# Patient Record
Sex: Male | Born: 1935 | Race: White | Hispanic: No | Marital: Married | Smoking: Never smoker
Health system: Southern US, Community
[De-identification: ages and names within clinical notes are randomized; demographics above are authoritative.]

## PROBLEM LIST (undated history)

## (undated) DIAGNOSIS — I639 Cerebral infarction, unspecified: Secondary | ICD-10-CM

## (undated) DIAGNOSIS — I2510 Atherosclerotic heart disease of native coronary artery without angina pectoris: Secondary | ICD-10-CM

## (undated) DIAGNOSIS — I619 Nontraumatic intracerebral hemorrhage, unspecified: Secondary | ICD-10-CM

---
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
## 2012-04-14 DIAGNOSIS — I619 Nontraumatic intracerebral hemorrhage, unspecified: Secondary | ICD-10-CM

## 2012-04-14 HISTORY — DX: Nontraumatic intracerebral hemorrhage, unspecified: I61.9

## 2013-02-28 ENCOUNTER — Emergency Department (HOSPITAL_COMMUNITY): Payer: Medicare Other

## 2013-02-28 ENCOUNTER — Inpatient Hospital Stay (HOSPITAL_COMMUNITY)
Admission: EM | Admit: 2013-02-28 | Discharge: 2013-03-15 | DRG: 040 | Disposition: A | Discharge status: Rehabilitation Facility | Payer: Medicare Other | Attending: Family Medicine | Admitting: Family Medicine

## 2013-02-28 ENCOUNTER — Encounter (HOSPITAL_COMMUNITY): Payer: Self-pay | Admitting: Emergency Medicine

## 2013-02-28 DIAGNOSIS — E876 Hypokalemia: Secondary | ICD-10-CM | POA: Diagnosis present

## 2013-02-28 DIAGNOSIS — E859 Amyloidosis, unspecified: Secondary | ICD-10-CM

## 2013-02-28 DIAGNOSIS — R443 Hallucinations, unspecified: Secondary | ICD-10-CM | POA: Diagnosis present

## 2013-02-28 DIAGNOSIS — I619 Nontraumatic intracerebral hemorrhage, unspecified: Secondary | ICD-10-CM | POA: Diagnosis present

## 2013-02-28 DIAGNOSIS — I498 Other specified cardiac arrhythmias: Secondary | ICD-10-CM | POA: Diagnosis present

## 2013-02-28 DIAGNOSIS — G9340 Encephalopathy, unspecified: Secondary | ICD-10-CM | POA: Diagnosis present

## 2013-02-28 DIAGNOSIS — R4182 Altered mental status, unspecified: Secondary | ICD-10-CM

## 2013-02-28 DIAGNOSIS — R001 Bradycardia, unspecified: Secondary | ICD-10-CM | POA: Diagnosis present

## 2013-02-28 DIAGNOSIS — H04129 Dry eye syndrome of unspecified lacrimal gland: Secondary | ICD-10-CM | POA: Diagnosis present

## 2013-02-28 DIAGNOSIS — F40298 Other specified phobia: Secondary | ICD-10-CM | POA: Diagnosis present

## 2013-02-28 DIAGNOSIS — H04123 Dry eye syndrome of bilateral lacrimal glands: Secondary | ICD-10-CM | POA: Diagnosis present

## 2013-02-28 DIAGNOSIS — Z79899 Other long term (current) drug therapy: Secondary | ICD-10-CM

## 2013-02-28 DIAGNOSIS — N401 Enlarged prostate with lower urinary tract symptoms: Secondary | ICD-10-CM | POA: Diagnosis present

## 2013-02-28 DIAGNOSIS — I82409 Acute embolism and thrombosis of unspecified deep veins of unspecified lower extremity: Secondary | ICD-10-CM | POA: Diagnosis present

## 2013-02-28 DIAGNOSIS — G0490 Encephalitis and encephalomyelitis, unspecified: Secondary | ICD-10-CM

## 2013-02-28 DIAGNOSIS — E871 Hypo-osmolality and hyponatremia: Secondary | ICD-10-CM | POA: Diagnosis present

## 2013-02-28 DIAGNOSIS — E785 Hyperlipidemia, unspecified: Secondary | ICD-10-CM | POA: Diagnosis present

## 2013-02-28 DIAGNOSIS — N138 Other obstructive and reflux uropathy: Secondary | ICD-10-CM | POA: Diagnosis present

## 2013-02-28 DIAGNOSIS — G060 Intracranial abscess and granuloma: Secondary | ICD-10-CM

## 2013-02-28 DIAGNOSIS — N170 Acute kidney failure with tubular necrosis: Secondary | ICD-10-CM | POA: Diagnosis not present

## 2013-02-28 DIAGNOSIS — R519 Headache, unspecified: Secondary | ICD-10-CM | POA: Diagnosis present

## 2013-02-28 DIAGNOSIS — I2699 Other pulmonary embolism without acute cor pulmonale: Secondary | ICD-10-CM | POA: Diagnosis present

## 2013-02-28 DIAGNOSIS — R339 Retention of urine, unspecified: Secondary | ICD-10-CM | POA: Diagnosis present

## 2013-02-28 DIAGNOSIS — Z113 Encounter for screening for infections with a predominantly sexual mode of transmission: Secondary | ICD-10-CM

## 2013-02-28 DIAGNOSIS — R509 Fever, unspecified: Secondary | ICD-10-CM | POA: Diagnosis present

## 2013-02-28 DIAGNOSIS — R410 Disorientation, unspecified: Secondary | ICD-10-CM

## 2013-02-28 DIAGNOSIS — I10 Essential (primary) hypertension: Secondary | ICD-10-CM | POA: Diagnosis present

## 2013-02-28 DIAGNOSIS — Z8673 Personal history of transient ischemic attack (TIA), and cerebral infarction without residual deficits: Secondary | ICD-10-CM

## 2013-02-28 DIAGNOSIS — I639 Cerebral infarction, unspecified: Secondary | ICD-10-CM

## 2013-02-28 HISTORY — DX: Cerebral infarction, unspecified: I63.9

## 2013-02-28 HISTORY — DX: Atherosclerotic heart disease of native coronary artery without angina pectoris: I25.10

## 2013-02-28 LAB — CBC WITH DIFFERENTIAL/PLATELET
Eosinophils Absolute: 0 10*3/uL (ref 0.0–0.7)
Lymphocytes Relative: 9 % — ABNORMAL LOW (ref 12–46)
Lymphs Abs: 1.4 10*3/uL (ref 0.7–4.0)
MCH: 32.7 pg (ref 26.0–34.0)
MCV: 91.2 fL (ref 78.0–100.0)
Neutro Abs: 11.7 10*3/uL — ABNORMAL HIGH (ref 1.7–7.7)
Neutrophils Relative %: 75 % (ref 43–77)
Platelets: 220 10*3/uL (ref 150–400)
RBC: 4.52 MIL/uL (ref 4.22–5.81)
WBC: 15.5 10*3/uL — ABNORMAL HIGH (ref 4.0–10.5)

## 2013-02-28 LAB — COMPREHENSIVE METABOLIC PANEL
AST: 37 U/L (ref 0–37)
Albumin: 3.4 g/dL — ABNORMAL LOW (ref 3.5–5.2)
Alkaline Phosphatase: 41 U/L (ref 39–117)
BUN: 17 mg/dL (ref 6–23)
CO2: 22 mEq/L (ref 19–32)
Calcium: 9.4 mg/dL (ref 8.4–10.5)
Chloride: 97 mEq/L (ref 96–112)
GFR calc Af Amer: >90 mL/min (ref >90)
GFR calc non Af Amer: 84 mL/min — ABNORMAL LOW (ref >90)
Glucose, Bld: 138 mg/dL — ABNORMAL HIGH (ref 70–99)
Potassium: 3.3 mEq/L — ABNORMAL LOW (ref 3.5–5.1)
Total Bilirubin: 1.3 mg/dL — ABNORMAL HIGH (ref 0.3–1.2)

## 2013-02-28 LAB — CG4 I-STAT (LACTIC ACID): Lactic Acid, Venous: 1.1 mmol/L (ref 0.5–2.2)

## 2013-02-28 MED ORDER — SODIUM CHLORIDE 0.9 % IV SOLN
1000.0000 mL | INTRAVENOUS | Status: DC
  Administered 2013-03-01: 1000 mL via INTRAVENOUS

## 2013-02-28 MED ORDER — ACETAMINOPHEN 325 MG PO TABS
650.0000 mg | ORAL_TABLET | Freq: Once | ORAL | Status: AC
  Administered 2013-03-01: 650 mg via ORAL
  Filled 2013-02-28: qty 2

## 2013-02-28 MED ORDER — PIPERACILLIN-TAZOBACTAM 3.375 G IVPB 30 MIN
3.3750 g | Freq: Once | INTRAVENOUS | Status: AC
  Administered 2013-02-28: 3.375 g via INTRAVENOUS
  Filled 2013-02-28: qty 50

## 2013-02-28 MED ORDER — SODIUM CHLORIDE 0.9 % IV SOLN
1000.0000 mL | Freq: Once | INTRAVENOUS | Status: AC
  Administered 2013-02-28: 1000 mL via INTRAVENOUS

## 2013-02-28 MED ORDER — VANCOMYCIN HCL IN DEXTROSE 1-5 GM/200ML-% IV SOLN
1000.0000 mg | Freq: Once | INTRAVENOUS | Status: AC
  Administered 2013-03-01: 1000 mg via INTRAVENOUS
  Filled 2013-02-28: qty 200

## 2013-02-28 NOTE — ED Notes (Signed)
Lactic Acid results given to EDP.

## 2013-02-28 NOTE — ED Provider Notes (Signed)
CSN: [id number]     Arrival date & time 02/28/13  2219 History   First MD Initiated Contact with Patient 02/28/13 2236     Chief Complaint  Patient presents with  . Altered Mental Status   (Consider location/radiation/quality/duration/timing/severity/associated sxs/prior Treatment) HPI Here for altered mental status. [facility]. Started acting confused today, intermittent symptoms. Described as moderately severe per family at bedside. Pt states he has a frontal headache. Sharp without radiation. Associated with fever, episode of urinary incontinence. Brought here via EMS who report temp 103.    Past Medical History  Diagnosis Date  . Stroke    No past surgical history on file. No family history on file. History  Substance Use Topics  . Smoking status: Never Smoker   . Smokeless tobacco: Not on file  . Alcohol Use: No    Review of Systems Constitutional: Positive for fever.  Eyes: Negative for vision loss.  ENT: Negative for difficulty swallowing.  Cardiovascular: Negative for chest pain. Respiratory: Negative for respiratory distress.  Gastrointestinal:  Negative for vomiting.  Genitourinary: Negative for inability to void.  Musculoskeletal: Negative for gait problem.  Integumentary: Negative for rash.  Neurological: Negative for new focal weakness.     Allergies  Review of patient's allergies indicates no known allergies.  Home Medications   Current Outpatient Rx  Name  Route  Sig  Dispense  Refill  . aspirin 325 MG tablet   Oral   Take 325 mg by mouth daily with lunch.         Marland Kitchen atorvastatin (LIPITOR) 20 MG tablet   Oral   Take 20 mg by mouth at bedtime.         Marland Kitchen esomeprazole (NEXIUM) 40 MG capsule   Oral   Take 40 mg by mouth daily before breakfast.         . fenofibrate (TRICOR) 145 MG tablet   Oral   Take 145 mg by mouth at bedtime.         . Glucosamine-Chondroitin (OSTEO BI-FLEX REGULAR STRENGTH  PO)   Oral   Take 1 tablet by mouth daily with breakfast.         . metoprolol (LOPRESSOR) 50 MG tablet   Oral   Take 50 mg by mouth daily with lunch.         . Multiple Vitamin (MULTIVITAMIN WITH MINERALS) TABS tablet   Oral   Take 1 tablet by mouth daily with breakfast.         . Omega-3 Fatty Acids (FISH OIL PO)   Oral   Take 1 capsule by mouth daily with breakfast.          BP 156/79  Pulse 56  Temp(Src) 102.3 F (39.1 C) (Rectal)  Resp 13  SpO2 97% Physical Exam Nursing note and vitals reviewed.  Constitutional: Pt is alert and appears stated age. Warm to touch.  Eyes: No injection, no scleral icterus. HENT: Atraumatic, airway open without erythema or exudate.  Respiratory: No respiratory distress. Equal breathing bilaterally. Cardiovascular: Normal rate. Extremities warm and well perfused.  Abdomen: Soft, non-tender. MSK: Extremities are atraumatic without deformity. Skin: No rash, no wounds.   Neuro: No motor nor sensory deficit. GCS 15. Oriented x3. Normal CN.      ED Course  Procedures (including critical care time) Labs Review Labs Reviewed  CBC WITH DIFFERENTIAL - Abnormal; Notable for the following:    WBC 15.5 (*)    Neutro Abs 11.7 (*)  Lymphocytes Relative 9 (*)    Monocytes Relative 16 (*)    Monocytes Absolute 2.4 (*)    All other components within normal limits  COMPREHENSIVE METABOLIC PANEL - Abnormal; Notable for the following:    Sodium 131 (*)    Potassium 3.3 (*)    Glucose, Bld 138 (*)    Albumin 3.4 (*)    Total Bilirubin 1.3 (*)    GFR calc non Af Amer 84 (*)    All other components within normal limits  CULTURE, BLOOD (ROUTINE X 2)  CULTURE, BLOOD (ROUTINE X 2)  URINE CULTURE  URINALYSIS, ROUTINE W REFLEX MICROSCOPIC  CG4 I-STAT (LACTIC ACID)   Imaging Review Ct Head Wo Contrast  02/28/2013   CLINICAL DATA:  Altered mental status.  Stroke.  EXAM: CT HEAD WITHOUT CONTRAST  TECHNIQUE: Contiguous axial images were  obtained from the base of the skull through the vertex without intravenous contrast.  COMPARISON:  None.  FINDINGS: Skull and Sinuses:No significant abnormality.  Orbits: Bilateral cataract resection.  Brain: Acute hemorrhage in the posterior right cerebral hemisphere, located in the posterior temporal and occipital regions. The hematoma is irregularly shaped, but the entire area of abnormality measures 7cm in length, 3 cm in width. There is a moderate amount of surrounding cerebral edema. Minimal intraventricular extension into the right lateral ventricle. No herniation, midline shift, or hydrocephalus.  Chronic small vessel ischemic white matter disease with patchy bilateral cerebral white matter low density. Extensive intracranial atherosclerotic calcification.  Critical Value/emergent results were called by telephone at the time of interpretation on 02/28/2013 at 11:13 PM to Dr.JOSHUA ZAVITZ , who verbally acknowledged these results.  IMPRESSION: 1. Right temporal/occipital parenchymal hematoma, measuring up to 7 cm in length. Trace intraventricular extension; no hydrocephalus. 2. Chronic small vessel ischemic white matter disease.   Electronically Signed   By: Tiburcio Pea M.D.   On: 02/28/2013 23:16   Dg Chest Portable 1 View  02/28/2013   CLINICAL DATA:  Altered mental status.  EXAM: PORTABLE CHEST - 1 VIEW  COMPARISON:  None.  FINDINGS: The heart size and mediastinal contours are within normal limits. Both lungs are clear. The visualized skeletal structures are unremarkable.  IMPRESSION: No active disease.   Electronically Signed   By: Tiburcio Pea M.D.   On: 02/28/2013 23:03    EKG Interpretation   None       MDM   1. Altered mental status   2. Fever   3. Delirium    77 y.o. male w/ PMHx of recent hemorraghic stroke presents w/ fever, AMS c/w delirium. Concerned for hospital acquired infection. No diarrhea to suggest c diff. Normal skin exam. Code sepsis initiated with vanc/zosyn.  CT head ordered given recent bleed.   CT head with right temporal/occiptial parenchyma hematoma without midline shift, herniation, or hydrocephalus. Images compared to those at [facility] from last week. Called [facility] to discuss. They will evaluate in the morning and advise supportive care. CBC with leukocytosis. CMP unremarkable. Lactic acid low. Pt stable on re-eval. Urine possible source given incontinence. Asked nurse to cath for UA. Called medicine for admission. Temp orders placed.      I independently viewed, interpreted, and used in my medical decision making all ordered lab and imaging tests. Medical Decision Making discussed with ED attending Enid Skeens, MD      Charm Barges, MD 03/01/13 [phone]

## 2013-02-28 NOTE — ED Notes (Signed)
Per EMS: pt with prev hemorrhagic stroke, dc from [facility] yesterday, per fam past 6 hours progressive worsening in mental status and increased confusion although symptoms started this AM.  Pt states he has severe frontal h/a, no unilateral deficits noted.  Febrile per EMS and on arrival.

## 2013-03-01 ENCOUNTER — Encounter (HOSPITAL_COMMUNITY): Payer: Self-pay | Admitting: Neurology

## 2013-03-01 ENCOUNTER — Inpatient Hospital Stay (HOSPITAL_COMMUNITY): Payer: Medicare Other

## 2013-03-01 DIAGNOSIS — R404 Transient alteration of awareness: Secondary | ICD-10-CM

## 2013-03-01 DIAGNOSIS — R4182 Altered mental status, unspecified: Secondary | ICD-10-CM

## 2013-03-01 DIAGNOSIS — I10 Essential (primary) hypertension: Secondary | ICD-10-CM

## 2013-03-01 DIAGNOSIS — R509 Fever, unspecified: Secondary | ICD-10-CM

## 2013-03-01 DIAGNOSIS — I619 Nontraumatic intracerebral hemorrhage, unspecified: Secondary | ICD-10-CM

## 2013-03-01 DIAGNOSIS — I498 Other specified cardiac arrhythmias: Secondary | ICD-10-CM

## 2013-03-01 DIAGNOSIS — E876 Hypokalemia: Secondary | ICD-10-CM

## 2013-03-01 LAB — COMPREHENSIVE METABOLIC PANEL
ALT: 28 U/L (ref 0–53)
AST: 31 U/L (ref 0–37)
CO2: 20 mEq/L (ref 19–32)
Calcium: 8.7 mg/dL (ref 8.4–10.5)
Creatinine, Ser: 0.98 mg/dL (ref 0.50–1.35)
GFR calc Af Amer: >90 mL/min (ref >90)
GFR calc non Af Amer: 79 mL/min — ABNORMAL LOW (ref >90)
Glucose, Bld: 111 mg/dL — ABNORMAL HIGH (ref 70–99)
Sodium: 137 mEq/L (ref 135–145)
Total Protein: 6.8 g/dL (ref 6.0–8.3)

## 2013-03-01 LAB — CBC WITH DIFFERENTIAL/PLATELET
Basophils Absolute: 0 10*3/uL (ref 0.0–0.1)
Basophils Relative: 0 % (ref 0–1)
Eosinophils Absolute: 0 10*3/uL (ref 0.0–0.7)
Eosinophils Relative: 0 % (ref 0–5)
HCT: 40 % (ref 39.0–52.0)
Lymphocytes Relative: 15 % (ref 12–46)
Lymphs Abs: 1.8 10*3/uL (ref 0.7–4.0)
MCH: 31.9 pg (ref 26.0–34.0)
MCHC: 35 g/dL (ref 30.0–36.0)
MCV: 91.1 fL (ref 78.0–100.0)
Monocytes Absolute: 2.2 10*3/uL — ABNORMAL HIGH (ref 0.1–1.0)
Platelets: 181 10*3/uL (ref 150–400)
RDW: 13 % (ref 11.5–15.5)

## 2013-03-01 LAB — URINALYSIS, ROUTINE W REFLEX MICROSCOPIC
Glucose, UA: NEGATIVE mg/dL
Hgb urine dipstick: NEGATIVE
Ketones, ur: NEGATIVE mg/dL
Protein, ur: NEGATIVE mg/dL

## 2013-03-01 LAB — SEDIMENTATION RATE: Sed Rate: 27 mm/hr — ABNORMAL HIGH (ref 0–16)

## 2013-03-01 MED ORDER — METOPROLOL TARTRATE 25 MG PO TABS
25.0000 mg | ORAL_TABLET | Freq: Two times a day (BID) | ORAL | Status: DC
  Filled 2013-03-01 (×2): qty 1

## 2013-03-01 MED ORDER — TRAMADOL HCL 50 MG PO TABS
50.0000 mg | ORAL_TABLET | Freq: Four times a day (QID) | ORAL | Status: DC | PRN
  Administered 2013-03-01 – 2013-03-08 (×12): 50 mg via ORAL
  Filled 2013-03-01 (×12): qty 1

## 2013-03-01 MED ORDER — ONDANSETRON HCL 4 MG/2ML IJ SOLN
4.0000 mg | Freq: Three times a day (TID) | INTRAMUSCULAR | Status: DC | PRN
  Administered 2013-03-01 – 2013-03-03 (×2): 4 mg via INTRAVENOUS
  Filled 2013-03-01: qty 2

## 2013-03-01 MED ORDER — PIPERACILLIN-TAZOBACTAM 3.375 G IVPB
3.3750 g | Freq: Three times a day (TID) | INTRAVENOUS | Status: DC
  Administered 2013-03-01: 3.375 g via INTRAVENOUS
  Filled 2013-03-01 (×3): qty 50

## 2013-03-01 MED ORDER — HYDRALAZINE HCL 20 MG/ML IJ SOLN
10.0000 mg | Freq: Four times a day (QID) | INTRAMUSCULAR | Status: DC | PRN
  Administered 2013-03-01 – 2013-03-13 (×15): 10 mg via INTRAVENOUS
  Filled 2013-03-01 (×15): qty 1

## 2013-03-01 MED ORDER — VANCOMYCIN HCL IN DEXTROSE 1-5 GM/200ML-% IV SOLN
1000.0000 mg | Freq: Two times a day (BID) | INTRAVENOUS | Status: DC
  Administered 2013-03-01 – 2013-03-02 (×2): 1000 mg via INTRAVENOUS
  Filled 2013-03-01 (×3): qty 200

## 2013-03-01 MED ORDER — ACETAMINOPHEN 325 MG PO TABS
650.0000 mg | ORAL_TABLET | Freq: Four times a day (QID) | ORAL | Status: DC | PRN
  Administered 2013-03-01 – 2013-03-15 (×26): 650 mg via ORAL
  Filled 2013-03-01 (×26): qty 2

## 2013-03-01 MED ORDER — ENSURE COMPLETE PO LIQD
237.0000 mL | Freq: Three times a day (TID) | ORAL | Status: DC
  Administered 2013-03-01 – 2013-03-06 (×13): 237 mL via ORAL

## 2013-03-01 MED ORDER — ONDANSETRON HCL 4 MG/2ML IJ SOLN
4.0000 mg | Freq: Three times a day (TID) | INTRAMUSCULAR | Status: AC | PRN
  Filled 2013-03-01: qty 2

## 2013-03-01 MED ORDER — POTASSIUM CHLORIDE CRYS ER 20 MEQ PO TBCR
40.0000 meq | EXTENDED_RELEASE_TABLET | Freq: Once | ORAL | Status: AC
  Administered 2013-03-01: 40 meq via ORAL
  Filled 2013-03-01: qty 2

## 2013-03-01 MED ORDER — SODIUM CHLORIDE 0.9 % IV SOLN
INTRAVENOUS | Status: DC
  Administered 2013-03-01 – 2013-03-10 (×14): via INTRAVENOUS

## 2013-03-01 MED ORDER — PANTOPRAZOLE SODIUM 40 MG PO TBEC
40.0000 mg | DELAYED_RELEASE_TABLET | Freq: Every day | ORAL | Status: DC
  Administered 2013-03-01 – 2013-03-15 (×14): 40 mg via ORAL
  Filled 2013-03-01 (×13): qty 1

## 2013-03-01 MED ORDER — ATORVASTATIN CALCIUM 20 MG PO TABS
20.0000 mg | ORAL_TABLET | Freq: Every day | ORAL | Status: DC
  Administered 2013-03-01 – 2013-03-14 (×14): 20 mg via ORAL
  Filled 2013-03-01 (×17): qty 1

## 2013-03-01 MED ORDER — DEXTROSE 5 % IV SOLN
2.0000 g | INTRAVENOUS | Status: DC
  Administered 2013-03-01: 2 g via INTRAVENOUS
  Filled 2013-03-01 (×2): qty 2

## 2013-03-01 MED ORDER — ENSURE COMPLETE PO LIQD
237.0000 mL | Freq: Two times a day (BID) | ORAL | Status: DC
  Administered 2013-03-01 (×2): 237 mL via ORAL

## 2013-03-01 NOTE — ED Notes (Signed)
BC's obtained

## 2013-03-01 NOTE — Consult Note (Signed)
NEURO HOSPITALIST CONSULT NOTE    Reason for Consult: confusion  HPI:                                                                                                                                          Walter Larsen is an 77 y.o. male who was discharged from [facility] on 02/27/13 after suffering a right temporal/occipital parenchymal hematoma. Per labs at [facility] his WBC on 11/14 (11.8), 11/15 (15.6) and 11/16 (13.5).  Patietn was brought to [facility] ED after family had brought patient home and noted he was complaining of HA, confusion (talking about a basket ball which never occurred) and periods of staring, in addition to elevated BP (190's systolic). On EMS arrival he was noted to be febrile and on admission to ED he was noted to have a temperature of 103. CT obtained In ED showed previous ICH with trace intraventricular extension which was noted in films prior at [facility]. patient was seen by internal medicine who started him on Zosyn.  Currently his temperature is 100.3. WBC has decreased from 15.5 to 12.0. Granddaughter who is at bedside states his mental status has improved significantly but he is still not back to his baseline. Patients main complaint is HA located in the frontal and right parietal region.   Past Medical History  Diagnosis Date  . Stroke     No past surgical history on file.  Family History  Problem Relation Age of Onset  . Hypertension Mother   . Hypertension Father      Social History:  reports that he has never smoked. He does not have any smokeless tobacco history on file. He reports that he does not drink alcohol or use illicit drugs.  No Known Allergies  MEDICATIONS:                                                                                                                     Prior to Admission:  Prescriptions prior to admission  Medication Sig Dispense Refill  . atorvastatin (LIPITOR) 20 MG tablet Take 20 mg by mouth at bedtime.       Marland Kitchen esomeprazole (NEXIUM) 40 MG capsule Take 40 mg by mouth daily before breakfast.      . fenofibrate (TRICOR) 145 MG  tablet Take 145 mg by mouth at bedtime.      . Glucosamine-Chondroitin (OSTEO BI-FLEX REGULAR STRENGTH PO) Take 1 tablet by mouth daily with breakfast.      . metoprolol (LOPRESSOR) 50 MG tablet Take 50 mg by mouth daily with lunch.      . Multiple Vitamin (MULTIVITAMIN WITH MINERALS) TABS tablet Take 1 tablet by mouth daily with breakfast.      . Omega-3 Fatty Acids (FISH OIL PO) Take 1 capsule by mouth daily with breakfast.      . [DISCONTINUED] aspirin 325 MG tablet Take 325 mg by mouth daily with lunch.       Scheduled: . atorvastatin  20 mg Oral QHS  . feeding supplement (ENSURE COMPLETE)  237 mL Oral BID BM  . pantoprazole  40 mg Oral Daily  . piperacillin-tazobactam (ZOSYN)  IV  3.375 g Intravenous Q8H  . vancomycin  1,000 mg Intravenous Q12H   Continuous: . sodium chloride 100 mL/hr at 03/01/13 0902   ZOX:[id number], hydrALAZINE, ondansetron (ZOFRAN) IV   ROS:                                                                                                                                       History obtained from the patient  General ROS: negative for - chills, fatigue, fever, night sweats, weight gain or weight loss Psychological ROS: negative for - behavioral disorder, hallucinations, memory difficulties, mood swings or suicidal ideation Ophthalmic ROS: negative for - blurry vision, double vision, eye pain or loss of vision ENT ROS: negative for - epistaxis, nasal discharge, oral lesions, sore throat, tinnitus or vertigo Allergy and Immunology ROS: negative for - hives or itchy/watery eyes Hematological and Lymphatic ROS: negative for - bleeding problems, bruising or swollen lymph nodes Endocrine ROS: negative for - galactorrhea, hair pattern changes, polydipsia/polyuria or temperature intolerance Respiratory ROS: negative for - cough, hemoptysis,  shortness of breath or wheezing Cardiovascular ROS: negative for - chest pain, dyspnea on exertion, edema or irregular heartbeat Gastrointestinal ROS: negative for - abdominal pain, diarrhea, hematemesis, nausea/vomiting or stool incontinence Genito-Urinary ROS: negative for - dysuria, hematuria, incontinence or urinary frequency/urgency Musculoskeletal ROS: negative for - joint swelling or muscular weakness Neurological ROS: as noted in HPI Dermatological ROS: negative for rash and skin lesion changes   Blood pressure 145/60, pulse 65, temperature 100.3 F (37.9 C), temperature source Oral, resp. rate 18, height 5\' 8"  (1.727 m), weight 81.647 kg (180 lb), SpO2 98.00%.   Neurologic Examination:  Mental Status: Alert, oriented, thought content appropriate.  Speech fluent without evidence of aphasia.  Able to follow 3 step commands without difficulty. Able to name objects and tell what they are used for.  Cranial Nerves: II: Discs flat bilaterally; Visual fields (+) left hemianopsia, pupils equal, round, reactive to light and accommodation III,IV, VI: ptosis not present, extra-ocular motions intact bilaterally V,VII: smile symmetric, facial light touch sensation normal bilaterally VIII: hearing normal bilaterally IX,X: gag reflex present XI: bilateral shoulder shrug XII: midline tongue extension without atrophy or fasciculations  Motor: Right : Upper extremity   5/5    Left:     Upper extremity   5/5  Lower extremity   5/5     Lower extremity   5/5 Tone and bulk:normal tone throughout; no atrophy noted Sensory: Pinprick and light touch intact throughout, bilaterally Deep Tendon Reflexes:  Right: Upper Extremity   Left: Upper extremity   biceps (C-5 to C-6) 2/4   biceps (C-5 to C-6) 2/4 tricep (C7) 2/4    triceps (C7) 2/4 Brachioradialis (C6) 2/4  Brachioradialis (C6) 2/4  Lower  Extremity Lower Extremity  quadriceps (L-2 to L-4) 2/4   quadriceps (L-2 to L-4) 2/4 Achilles (S1) 2/4   Achilles (S1) 2/4  Plantars: Right: downgoing   Left: downgoing Cerebellar: normal finger-to-nose,  normal heel-to-shin test Gait: not tested CV: pulses palpable throughout    No components found with this basename: cbc,  bmp,  coags,  chol,  tri,  ldl,  hga1c    Results for orders placed during the hospital encounter of 02/28/13 (from the past 48 hour(s))  CBC WITH DIFFERENTIAL     Status: Abnormal   Collection Time    02/28/13 10:45 PM      Result Value Range   WBC 15.5 (*) 4.0 - 10.5 K/uL   RBC 4.52  4.22 - 5.81 MIL/uL   Hemoglobin 14.8  13.0 - 17.0 g/dL   HCT 40.9  81.1 - 91.4 %   MCV 91.2  78.0 - 100.0 fL   MCH 32.7  26.0 - 34.0 pg   MCHC 35.9  30.0 - 36.0 g/dL   RDW 78.2  95.6 - 21.3 %   Platelets 220  150 - 400 K/uL   Neutrophils Relative % 75  43 - 77 %   Neutro Abs 11.7 (*) 1.7 - 7.7 K/uL   Lymphocytes Relative 9 (*) 12 - 46 %   Lymphs Abs 1.4  0.7 - 4.0 K/uL   Monocytes Relative 16 (*) 3 - 12 %   Monocytes Absolute 2.4 (*) 0.1 - 1.0 K/uL   Eosinophils Relative 0  0 - 5 %   Eosinophils Absolute 0.0  0.0 - 0.7 K/uL   Basophils Relative 0  0 - 1 %   Basophils Absolute 0.0  0.0 - 0.1 K/uL  COMPREHENSIVE METABOLIC PANEL     Status: Abnormal   Collection Time    02/28/13 10:50 PM      Result Value Range   Sodium 131 (*) 135 - 145 mEq/L   Potassium 3.3 (*) 3.5 - 5.1 mEq/L   Chloride 97  96 - 112 mEq/L   CO2 22  19 - 32 mEq/L   Glucose, Bld 138 (*) 70 - 99 mg/dL   BUN 17  6 - 23 mg/dL   Creatinine, Ser 0.86  0.50 - 1.35 mg/dL   Calcium 9.4  8.4 - 57.8 mg/dL   Total Protein 7.3  6.0 - 8.3 g/dL  Albumin 3.4 (*) 3.5 - 5.2 g/dL   AST 37  0 - 37 U/L   ALT 32  0 - 53 U/L   Alkaline Phosphatase 41  39 - 117 U/L   Total Bilirubin 1.3 (*) 0.3 - 1.2 mg/dL   GFR calc non Af Amer 84 (*) >90 mL/min   GFR calc Af Amer >90  >90 mL/min   Comment: (NOTE)     The eGFR  has been calculated using the CKD EPI equation.     This calculation has not been validated in all clinical situations.     eGFR's persistently <90 mL/min signify possible Chronic Kidney     Disease.  CG4 I-STAT (LACTIC ACID)     Status: None   Collection Time    02/28/13 11:01 PM      Result Value Range   Lactic Acid, Venous 1.10  0.5 - 2.2 mmol/L  URINALYSIS, ROUTINE W REFLEX MICROSCOPIC     Status: Abnormal   Collection Time    03/01/13 12:16 AM      Result Value Range   Color, Urine YELLOW  YELLOW   APPearance CLEAR  CLEAR   Specific Gravity, Urine 1.014  1.005 - 1.030   pH 7.0  5.0 - 8.0   Glucose, UA NEGATIVE  NEGATIVE mg/dL   Hgb urine dipstick NEGATIVE  NEGATIVE   Bilirubin Urine NEGATIVE  NEGATIVE   Ketones, ur NEGATIVE  NEGATIVE mg/dL   Protein, ur NEGATIVE  NEGATIVE mg/dL   Urobilinogen, UA 2.0 (*) 0.0 - 1.0 mg/dL   Nitrite NEGATIVE  NEGATIVE   Leukocytes, UA NEGATIVE  NEGATIVE   Comment: MICROSCOPIC NOT DONE ON URINES WITH NEGATIVE PROTEIN, BLOOD, LEUKOCYTES, NITRITE, OR GLUCOSE <1000 mg/dL.  CBC WITH DIFFERENTIAL     Status: Abnormal   Collection Time    03/01/13  6:15 AM      Result Value Range   WBC 12.0 (*) 4.0 - 10.5 K/uL   RBC 4.39  4.22 - 5.81 MIL/uL   Hemoglobin 14.0  13.0 - 17.0 g/dL   HCT 82.9  56.2 - 13.0 %   MCV 91.1  78.0 - 100.0 fL   MCH 31.9  26.0 - 34.0 pg   MCHC 35.0  30.0 - 36.0 g/dL   RDW 86.5  78.4 - 69.6 %   Platelets 181  150 - 400 K/uL   Neutrophils Relative % 66  43 - 77 %   Neutro Abs 8.0 (*) 1.7 - 7.7 K/uL   Lymphocytes Relative 15  12 - 46 %   Lymphs Abs 1.8  0.7 - 4.0 K/uL   Monocytes Relative 19 (*) 3 - 12 %   Monocytes Absolute 2.2 (*) 0.1 - 1.0 K/uL   Eosinophils Relative 0  0 - 5 %   Eosinophils Absolute 0.0  0.0 - 0.7 K/uL   Basophils Relative 0  0 - 1 %   Basophils Absolute 0.0  0.0 - 0.1 K/uL  COMPREHENSIVE METABOLIC PANEL     Status: Abnormal   Collection Time    03/01/13  6:15 AM      Result Value Range   Sodium 137   135 - 145 mEq/L   Potassium 3.6  3.5 - 5.1 mEq/L   Chloride 104  96 - 112 mEq/L   CO2 20  19 - 32 mEq/L   Glucose, Bld 111 (*) 70 - 99 mg/dL   BUN 15  6 - 23 mg/dL   Creatinine, Ser 2.95  0.50 -  1.35 mg/dL   Calcium 8.7  8.4 - 16.1 mg/dL   Total Protein 6.8  6.0 - 8.3 g/dL   Albumin 3.0 (*) 3.5 - 5.2 g/dL   AST 31  0 - 37 U/L   ALT 28  0 - 53 U/L   Alkaline Phosphatase 39  39 - 117 U/L   Total Bilirubin 1.1  0.3 - 1.2 mg/dL   GFR calc non Af Amer 79 (*) >90 mL/min   GFR calc Af Amer >90  >90 mL/min   Comment: (NOTE)     The eGFR has been calculated using the CKD EPI equation.     This calculation has not been validated in all clinical situations.     eGFR's persistently <90 mL/min signify possible Chronic Kidney     Disease.  SEDIMENTATION RATE     Status: Abnormal   Collection Time    03/01/13  6:15 AM      Result Value Range   Sed Rate 27 (*) 0 - 16 mm/hr    Ct Head Wo Contrast  02/28/2013   CLINICAL DATA:  Altered mental status.  Stroke.  EXAM: CT HEAD WITHOUT CONTRAST  TECHNIQUE: Contiguous axial images were obtained from the base of the skull through the vertex without intravenous contrast.  COMPARISON:  None.  FINDINGS: Skull and Sinuses:No significant abnormality.  Orbits: Bilateral cataract resection.  Brain: Acute hemorrhage in the posterior right cerebral hemisphere, located in the posterior temporal and occipital regions. The hematoma is irregularly shaped, but the entire area of abnormality measures 7cm in length, 3 cm in width. There is a moderate amount of surrounding cerebral edema. Minimal intraventricular extension into the right lateral ventricle. No herniation, midline shift, or hydrocephalus.  Chronic small vessel ischemic white matter disease with patchy bilateral cerebral white matter low density. Extensive intracranial atherosclerotic calcification.  Critical Value/emergent results were called by telephone at the time of interpretation on 02/28/2013 at 11:13 PM  to Dr.JOSHUA ZAVITZ , who verbally acknowledged these results.  IMPRESSION: 1. Right temporal/occipital parenchymal hematoma, measuring up to 7 cm in length. Trace intraventricular extension; no hydrocephalus. 2. Chronic small vessel ischemic white matter disease.   Electronically Signed   By: Tiburcio Pea M.D.   On: 02/28/2013 23:16   Dg Chest Portable 1 View  02/28/2013   CLINICAL DATA:  Altered mental status.  EXAM: PORTABLE CHEST - 1 VIEW  COMPARISON:  None.  FINDINGS: The heart size and mediastinal contours are within normal limits. Both lungs are clear. The visualized skeletal structures are unremarkable.  IMPRESSION: No active disease.   Electronically Signed   By: Tiburcio Pea M.D.   On: 02/28/2013 23:03    Assessment and plan per attending neurologist  Felicie Morn PA-C [facility] [phone]  03/01/2013, 12:37 PM   Assessment/Plan: 77 YO male with recent right temporal/occipital ICH which is unchanged since prior scan.  Patietn has improved in mentation post being treated with Vancomycin and Zosyn. Exam is non focal other than neck discomfort with FF and left hemianopsia (from recent ICH). Pateints neck discomfort is concerning. However, would not attempt LP at this time, as results would not be reliable. The intraventricular blood would cause a in accurate increase in CSF WBC count.  EEG has been finished and awaiting read.     Recommend: 1) Changing Zosyn to Rocephin for further coverage.   2) Continue Vancomycin 3) EEG reading to follow 4) Will continue to follow  I personally participated in this patient's evaluation  and management, including formulating about clinical impression and management recommendations.  Venetia Maxon M.D. [facility] [phone]

## 2013-03-01 NOTE — ED Provider Notes (Signed)
Medical screening examination/treatment/procedure(s) were conducted as a shared visit with non-physician practitioner(s) or resident and myself. I personally evaluated the patient during the encounter and agree with the findings and plan unless otherwise indicated.  I have personally reviewed any xrays and/ or EKG's with the provider and I agree with interpretation.  Worsening confusion since dc from [facility] yesterday. Fever today. No injuries. Pt not tolerating po, vomiting intermittent.  Exam general weakness,dry mm, mild pale, AO3, moves ext equal bilateral. Resident spoke with NSGY and hospitalist for admission to tele. Abx in ED. Fluids given.   Hemorrhagic CNS, Delirium, Fever EKG done as pt bradycardia/ confusion, reviewed.   Ct Head Wo Contrast  02/28/2013   CLINICAL DATA:  Altered mental status.  Stroke.  EXAM: CT HEAD WITHOUT CONTRAST  TECHNIQUE: Contiguous axial images were obtained from the base of the skull through the vertex without intravenous contrast.  COMPARISON:  None.  FINDINGS: Skull and Sinuses:No significant abnormality.  Orbits: Bilateral cataract resection.  Brain: Acute hemorrhage in the posterior right cerebral hemisphere, located in the posterior temporal and occipital regions. The hematoma is irregularly shaped, but the entire area of abnormality measures 7cm in length, 3 cm in width. There is a moderate amount of surrounding cerebral edema. Minimal intraventricular extension into the right lateral ventricle. No herniation, midline shift, or hydrocephalus.  Chronic small vessel ischemic white matter disease with patchy bilateral cerebral white matter low density. Extensive intracranial atherosclerotic calcification.  Critical Value/emergent results were called by telephone at the time of interpretation on 02/28/2013 at 11:13 PM to Dr.Jakyla Reza , who verbally acknowledged these results.  IMPRESSION: 1. Right temporal/occipital parenchymal hematoma, measuring up to 7 cm in  length. Trace intraventricular extension; no hydrocephalus. 2. Chronic small vessel ischemic white matter disease.   Electronically Signed   By: Tiburcio Pea M.D.   On: 02/28/2013 23:16   Dg Chest Portable 1 View  02/28/2013   CLINICAL DATA:  Altered mental status.  EXAM: PORTABLE CHEST - 1 VIEW  COMPARISON:  None.  FINDINGS: The heart size and mediastinal contours are within normal limits. Both lungs are clear. The visualized skeletal structures are unremarkable.  IMPRESSION: No active disease.   Electronically Signed   By: Tiburcio Pea M.D.   On: 02/28/2013 23:03   EKG Interpretation    Date/Time:  Tuesday March 01 2013 00:45:22 EST Ventricular Rate:  61 PR Interval:  157 QRS Duration: 87 QT Interval:  475 QTC Calculation: 478 R Axis:   76 Text Interpretation:  Sinus rhythm Left atrial enlargement Borderline prolonged QT interval No acute findings              Enid Skeens, MD 03/01/13 [phone]

## 2013-03-01 NOTE — ED Notes (Addendum)
Portable cxr in room

## 2013-03-01 NOTE — Progress Notes (Signed)
ANTIBIOTIC CONSULT NOTE - INITIAL  Pharmacy Consult for Vancomycin/Zosyn Indication: Fever, unknown origin  No Known Allergies  Patient Measurements: Height: 5\' 8"  (172.7 cm) Weight: 180 lb (81.647 kg) IBW/kg (Calculated) : 68.4  Vital Signs: Temp: 98.2 F (36.8 C) (11/18 0152) Temp src: Oral (11/18 0152) BP: 148/71 mmHg (11/18 0152) Pulse Rate: 49 (11/18 0152) Labs:  Recent Labs  02/28/13 2245 02/28/13 2250  WBC 15.5*  --   HGB 14.8  --   PLT 220  --   CREATININE  --  0.85   CrCl: 73.65mL/min  Assessment: 77 y/o M here with AMS/fever, DC'd from [facility] recently with hemorrhagic stroke (evident on CT 11/17). Lactic acid normal, mild leukocytosis, Tmax 102.3. Getting cultures and covering empirically with antibiotics for now.   ED Antibiotics  Vancomycin 1000 mg IV x 1 (11/18 0004) Zosyn 3.375g IV x 1 (11/17 2337)  Goal of Therapy:  Vancomycin trough level 15-20 mcg/ml  Plan:  -Vancomycin 1000 mg IV q12h -Zosyn 3.375G IV q8h to be infused over 4 hours -Trend WBC, temp, renal function -F/U cultures, imaging  Thank you for allowing me to take part in this patient's care,  Abran Duke, PharmD Clinical Pharmacist Phone: [phone] Pager: [phone] 03/01/2013 3:51 AM

## 2013-03-01 NOTE — Progress Notes (Signed)
INITIAL NUTRITION ASSESSMENT  DOCUMENTATION CODES Per approved criteria  -Not Applicable   INTERVENTION: Increase Ensure Complete to PO TID. Recommend diet liberalization to Regular to help with PO variety. RD to continue to follow nutrition care plan.  NUTRITION DIAGNOSIS: Inadequate oral intake related to severe nausea as evidenced by limited PO intake.   Goal: Intake to meet >90% of estimated nutrition needs.  Monitor:  weight trends, lab trends, I/O's, PO intake, supplement tolerance  Reason for Assessment: Malnutrition Screening Tool  77 y.o. male  Admitting Dx: Fever, unspecified  ASSESSMENT: PMHx significant for HTN, dyslipidemia. Recently admitted to [facility] with hemorrhagic stroke, discharged the day before yesterday.  Admitted with AMS, hallucinations, HA, fever and nausea. Work-up ongoing.  Pt has had improvement in mentation since being treated with Vanc and Zosyn. EEG completed, results pending, neuro following.  Currently ordered for Ensure Complete PO BID. Pt is tolerating these well and is consuming them as scheduled. Remains very nauseated and was nauseated at home. Per family, pt ate very poorly in the hospital. Usually weighs about 185 lb. Currently down to 180 lb. This is a weight change of 3%. Pt is unable to state when he last weighed this. Pt is at nutrition risk 2/2 poor oral intake and weight loss. Family interested in patient consuming more than 2 Ensures daily since that is all he is consuming. Will increase to TID.  Potassium and sodium were low on admission, currently WNL.  Height: Ht Readings from Last 1 Encounters:  03/01/13 5\' 8"  (1.727 m)    Weight: Wt Readings from Last 1 Encounters:  03/01/13 180 lb (81.647 kg)    Ideal Body Weight: 154 lb  % Ideal Body Weight: 117%  Wt Readings from Last 10 Encounters:  03/01/13 180 lb (81.647 kg)    Usual Body Weight: 185 lb  % Usual Body Weight: 97%  BMI:  Body mass index is  27.38 kg/(m^2). Overweight  Estimated Nutritional Needs: Kcal: 1700 - 2000 Protein: 80 - 95 g Fluid: 1.7 - 2 liters  Skin: stage II sacrum  Diet Order: Cardiac  EDUCATION NEEDS: -No education needs identified at this time   Intake/Output Summary (Last 24 hours) at 03/01/13 1357 Last data filed at 03/01/13 1337  Gross per 24 hour  Intake 4459.17 ml  Output    610 ml  Net 3849.17 ml    Last BM: 11/18  Labs:   Recent Labs Lab 02/28/13 2250 03/01/13 0615  NA 131* 137  K 3.3* 3.6  CL 97 104  CO2 22 20  BUN 17 15  CREATININE 0.85 0.98  CALCIUM 9.4 8.7  GLUCOSE 138* 111*    CBG (last 3)  No results found for this basename: GLUCAP,  in the last 72 hours  Scheduled Meds: . atorvastatin  20 mg Oral QHS  . cefTRIAXone (ROCEPHIN)  IV  2 g Intravenous Q24H  . feeding supplement (ENSURE COMPLETE)  237 mL Oral BID BM  . pantoprazole  40 mg Oral Daily  . vancomycin  1,000 mg Intravenous Q12H    Continuous Infusions: . sodium chloride 100 mL/hr at 03/01/13 8657    Past Medical History  Diagnosis Date  . Stroke     No past surgical history on file.  Jarold Motto MS, RD, LDN Pager: [phone] After-hours pager: [phone]

## 2013-03-01 NOTE — Procedures (Signed)
ELECTROENCEPHALOGRAM REPORT   Patient: Walter Larsen       Room #: 2X52 EEG No. ID: [id number] Age: 77 y.o.        Sex: male Referring Physician: Regalado Report Date:  03/01/2013        Interpreting Physician: Aline Brochure  History: Walter Larsen is an 77 y.o. male with a recent large right parieto-occipital intracerebral hemorrhage who was brought to the hospital following change in mental status. Patient was febrile on admission.  Indications for study:  Assess severity of encephalopathy; rule out focal seizure activity.  Technique: This is an 18 channel routine scalp EEG performed at the bedside with bipolar and monopolar montages arranged in accordance to the international 10/20 system of electrode placement.   Description: This EEG recording was performed during sleep and during a brief period of drowsiness. Background activity consisted of generalized slowing with nonspecific low amplitude diffuse irregular delta and theta activity, which was symmetrical. Normal sleep spindles, arousal responses and vertex waves were recorded during stage II of sleep. Photic stimulation was not performed. Hyperventilation was not performed. No epileptiform discharges recorded.  Interpretation: This is a normal EEG recording during sleep. No evidence of an epileptic disorder was seen.  Venetia Maxon M.D. [facility] [phone]

## 2013-03-01 NOTE — Progress Notes (Signed)
Patient seen and examined. Patient presents to ED because of increase confusion, fevers. Patient recently discharge from [facility] after an hemorrhagic stroke. Patient this morning, alert, answering questions. He thought he came directly from another hospital here. He denies diarrhea, abdominal pain. Does relates occasional cough. No dysuria. Neuro exam non focal. He has vision deficit after hemorrhagic stroke.   1-Fever: Follow urine culture, blood culture. Will repeat Chest x ray tomorrow after hydration. If no source identified he will need work up for FUO. Continue with Vancomycin and Zosyn.   2-Hypokalemia: B-met pending for this morning. Will give 40 meq times one.  3-. Right temporal/occipital parenchymal hematoma; neurosurgery consulted. Left message to Dr Danielle Dess.  4-Encephalopathy/ AMS: patient presents with increase confusion. Could be related to fever. Neurosurgery evaluation. EEG order.  5-Bradycardia; Asymptomatic. Hold Metoprolol for now.  6-HTN: holding metoprolol due to bradycardia. Hydralazine PRN>  7-DVT prophylaxis: SCD.   Please see admission HPI.  Hartley Barefoot, MD.

## 2013-03-01 NOTE — ED Notes (Signed)
MD at bedside. 

## 2013-03-01 NOTE — ED Notes (Addendum)
Pt to CT

## 2013-03-01 NOTE — Progress Notes (Signed)
UR complete.  Nickolette Espinola RN, MSN 

## 2013-03-01 NOTE — H&P (Signed)
[facility] History and Physical  Patient: Walter Larsen  RUE:[id number]  DOB: 16-Jan-1936  DOS: 03/01/2013 PCP: Provider Not In System  Chief Complaint: Altered mental status and fever  HPI: Nathin Saran is a 77 y.o. male with Past medical history of hypertension, dyslipidemia, [facility] with hemorrhagic [facility]. The patient is coming from home. The patient is presenting with complaints of altered mental status. He was brought in by his family. As per the family since last one week he had significant headache, after which [facility] was [facility] for hemorrhagic stroke. From where he was discharged day before yesterday. Today he has been having hallucinations and confabulation. He was talking about basketball match but there was none. He also complained of some further frontal headache and some nausea. There were episodes of staring during which he would take a while to respond. And also he had an episode of urinary incontinence. By the EMS arrived he was found to be febrile as well. At the time of my ventilation the patient was alert awake and oriented and did not have any acute complaint. There were no complaint of chest pain headache blurred vision focal neurological deficit. He denied any choking episode, and mentions compliant with all the medications.  Review of Systems: as mentioned in the history of present illness.  A Comprehensive review of the other systems is negative.  Past Medical History  Diagnosis Date  . Stroke    No past surgical history on file. Social History:  reports that he has never smoked. He does not have any smokeless tobacco history on file. He reports that he does not drink alcohol or use illicit drugs. Independent for most of his  ADL.  No Known Allergies  No family history on file.  Prior to Admission medications   Medication Sig Start Date End Date Taking? Authorizing Provider  atorvastatin (LIPITOR) 20 MG  tablet Take 20 mg by mouth at bedtime.   Yes Historical Provider, MD  esomeprazole (NEXIUM) 40 MG capsule Take 40 mg by mouth daily before breakfast.   Yes Historical Provider, MD  fenofibrate (TRICOR) 145 MG tablet Take 145 mg by mouth at bedtime.   Yes Historical Provider, MD  Glucosamine-Chondroitin (OSTEO BI-FLEX REGULAR STRENGTH PO) Take 1 tablet by mouth daily with breakfast.   Yes Historical Provider, MD  metoprolol (LOPRESSOR) 50 MG tablet Take 50 mg by mouth daily with lunch.   Yes Historical Provider, MD  Multiple Vitamin (MULTIVITAMIN WITH MINERALS) TABS tablet Take 1 tablet by mouth daily with breakfast.   Yes Historical Provider, MD  Omega-3 Fatty Acids (FISH OIL PO) Take 1 capsule by mouth daily with breakfast.   Yes Historical Provider, MD    Physical Exam: Filed Vitals:   03/01/13 0030 03/01/13 0059 03/01/13 0100 03/01/13 0152  BP: 164/61   148/71  Pulse: 44   49  Temp:  99.4 F (37.4 C) 99.4 F (37.4 C) 98.2 F (36.8 C)  TempSrc:  Rectal  Oral  Resp: 19   20  Height:    5\' 8"  (1.727 m)  Weight:    81.647 kg (180 lb)  SpO2: 96%   99%    General: Alert, Awake and Oriented to Time, Place and Person. Appear in mild distress Eyes: PERRL ENT: Oral Mucosa clear moist. Neck: no JVD Cardiovascular: S1 and S2 Present, no Murmur, Peripheral Pulses Present Respiratory: Bilateral Air entry equal and Decreased, Clear to Auscultation,  no Crackles,no wheezes Abdomen: Bowel  Sound Present, Soft and Non tender Skin: no Rash Extremities: no Pedal edema, no calf tenderness Neurologic: Grossly Unremarkable.Mental statusalert awake and oriented normal language, Cranial Nerves facial droop on the left, Motor strength equal both side, Sensation bilateral equal, reflexes intact, babinski negative, Proprioception intact, Cerebellar test finger-nose-finger normal.  Labs on Admission:  CBC:  Recent Labs Lab 02/28/13 2245  WBC 15.5*  NEUTROABS 11.7*  HGB 14.8  HCT 41.2  MCV 91.2   PLT 220    CMP     Component Value Date/Time   NA 131* 02/28/2013 2250   K 3.3* 02/28/2013 2250   CL 97 02/28/2013 2250   CO2 22 02/28/2013 2250   GLUCOSE 138* 02/28/2013 2250   BUN 17 02/28/2013 2250   CREATININE 0.85 02/28/2013 2250   CALCIUM 9.4 02/28/2013 2250   PROT 7.3 02/28/2013 2250   ALBUMIN 3.4* 02/28/2013 2250   AST 37 02/28/2013 2250   ALT 32 02/28/2013 2250   ALKPHOS 41 02/28/2013 2250   BILITOT 1.3* 02/28/2013 2250   GFRNONAA 84* 02/28/2013 2250   GFRAA >90 02/28/2013 2250    No results found for this basename: LIPASE, AMYLASE,  in the last 168 hours No results found for this basename: AMMONIA,  in the last 168 hours  No results found for this basename: CKTOTAL, CKMB, CKMBINDEX, TROPONINI,  in the last 168 hours BNP (last 3 results) No results found for this basename: PROBNP,  in the last 8760 hours  Radiological Exams on Admission: Ct Head Wo Contrast  02/28/2013   CLINICAL DATA:  Altered mental status.  Stroke.  EXAM: CT HEAD WITHOUT CONTRAST  TECHNIQUE: Contiguous axial images were obtained from the base of the skull through the vertex without intravenous contrast.  COMPARISON:  None.  FINDINGS: Skull and Sinuses:No significant abnormality.  Orbits: Bilateral cataract resection.  Brain: Acute hemorrhage in the posterior right cerebral hemisphere, located in the posterior temporal and occipital regions. The hematoma is irregularly shaped, but the entire area of abnormality measures 7cm in length, 3 cm in width. There is a moderate amount of surrounding cerebral edema. Minimal intraventricular extension into the right lateral ventricle. No herniation, midline shift, or hydrocephalus.  Chronic small vessel ischemic white matter disease with patchy bilateral cerebral white matter low density. Extensive intracranial atherosclerotic calcification.  Critical Value/emergent results were called by telephone at the time of interpretation on 02/28/2013 at 11:13 PM to  Dr.JOSHUA ZAVITZ , who verbally acknowledged these results.  IMPRESSION: 1. Right temporal/occipital parenchymal hematoma, measuring up to 7 cm in length. Trace intraventricular extension; no hydrocephalus. 2. Chronic small vessel ischemic white matter disease.   Electronically Signed   By: Tiburcio Pea M.D.   On: 02/28/2013 23:16   Dg Chest Portable 1 View  02/28/2013   CLINICAL DATA:  Altered mental status.  EXAM: PORTABLE CHEST - 1 VIEW  COMPARISON:  None.  FINDINGS: The heart size and mediastinal contours are within normal limits. Both lungs are clear. The visualized skeletal structures are unremarkable.  IMPRESSION: No active disease.   Electronically Signed   By: Tiburcio Pea M.D.   On: 02/28/2013 23:03    EKG: Independently reviewed. normal sinus rhythm.  Assessment/Plan Principal Problem:   Fever, unspecified Active Problems:   Hemorrhagic stroke   Essential hypertension, benign   1. Fever, unspecified the patient is presenting with fever leukocytosis questionable atypical lymphocytes in the CBC. He does not have any complain which pinpoints dual source of infection . His chest x-ray appears clear  as well as urinalysis appears normal. Blood culture had been obtained. we will obtain sputum culture as well as urine antigens . Patient has been started on empiric broad-spectrum antibiotic due to his recent hospitalization . Possible etiology could also include seizure leading to elevated temperatures or hemorrhagic stroke itself causing fever. Continue to monitor blood culture   2.Hypertension  Continue metoprolol  3.History of hemorrhagic stroke  Neurosurgery has been consulted and will see the patient in the hospital his recent CT scan as per the ER physician does not appear significantly different than his [facility] CT [facility]  Consults:   neurosurgery  DVT Prophylaxis: mechanical compression device Nutrition: Cardiac  Code Status: Full  Family  Communication: Family  was present at bedside, opportunity was given to ask question and all questions were answered satisfactorily at the time of interview. Disposition: Admitted to inpatient in telemetry unit.  Author: Lynden Oxford, MD [facility] Pager: [phone] 03/01/2013, 3:13 AM    If 7PM-7AM, please contact night-coverage [email or web address] Password [email or web address]

## 2013-03-01 NOTE — Progress Notes (Signed)
EEG Completed; Results Pending  

## 2013-03-02 DIAGNOSIS — Z113 Encounter for screening for infections with a predominantly sexual mode of transmission: Secondary | ICD-10-CM

## 2013-03-02 DIAGNOSIS — R51 Headache: Secondary | ICD-10-CM

## 2013-03-02 DIAGNOSIS — E785 Hyperlipidemia, unspecified: Secondary | ICD-10-CM | POA: Diagnosis present

## 2013-03-02 DIAGNOSIS — R001 Bradycardia, unspecified: Secondary | ICD-10-CM | POA: Diagnosis present

## 2013-03-02 DIAGNOSIS — I10 Essential (primary) hypertension: Secondary | ICD-10-CM

## 2013-03-02 DIAGNOSIS — E876 Hypokalemia: Secondary | ICD-10-CM | POA: Diagnosis present

## 2013-03-02 DIAGNOSIS — R509 Fever, unspecified: Secondary | ICD-10-CM

## 2013-03-02 LAB — BASIC METABOLIC PANEL
BUN: 14 mg/dL (ref 6–23)
Chloride: 101 mEq/L (ref 96–112)
Creatinine, Ser: 0.82 mg/dL (ref 0.50–1.35)
GFR calc non Af Amer: 85 mL/min — ABNORMAL LOW (ref >90)
Glucose, Bld: 128 mg/dL — ABNORMAL HIGH (ref 70–99)
Potassium: 3.3 mEq/L — ABNORMAL LOW (ref 3.5–5.1)

## 2013-03-02 LAB — CBC
HCT: 37.8 % — ABNORMAL LOW (ref 39.0–52.0)
Hemoglobin: 13.2 g/dL (ref 13.0–17.0)
RBC: 4.08 MIL/uL — ABNORMAL LOW (ref 4.22–5.81)
WBC: 11.2 10*3/uL — ABNORMAL HIGH (ref 4.0–10.5)

## 2013-03-02 LAB — URINE CULTURE

## 2013-03-02 MED ORDER — VANCOMYCIN HCL IN DEXTROSE 1-5 GM/200ML-% IV SOLN
1000.0000 mg | Freq: Two times a day (BID) | INTRAVENOUS | Status: DC
  Administered 2013-03-02 – 2013-03-05 (×6): 1000 mg via INTRAVENOUS
  Filled 2013-03-02 (×7): qty 200

## 2013-03-02 MED ORDER — DEXTROSE 5 % IV SOLN
2.0000 g | INTRAVENOUS | Status: DC
  Administered 2013-03-02: 2 g via INTRAVENOUS
  Filled 2013-03-02: qty 2

## 2013-03-02 MED ORDER — POTASSIUM CHLORIDE CRYS ER 20 MEQ PO TBCR
40.0000 meq | EXTENDED_RELEASE_TABLET | Freq: Once | ORAL | Status: AC
  Administered 2013-03-02: 40 meq via ORAL
  Filled 2013-03-02: qty 2

## 2013-03-02 MED ORDER — DEXTROSE 5 % IV SOLN
2.0000 g | Freq: Two times a day (BID) | INTRAVENOUS | Status: DC
  Administered 2013-03-03 – 2013-03-07 (×11): 2 g via INTRAVENOUS
  Filled 2013-03-02 (×11): qty 2

## 2013-03-02 MED ORDER — METRONIDAZOLE IN NACL 5-0.79 MG/ML-% IV SOLN
500.0000 mg | Freq: Four times a day (QID) | INTRAVENOUS | Status: DC
  Administered 2013-03-02 – 2013-03-07 (×19): 500 mg via INTRAVENOUS
  Filled 2013-03-02 (×25): qty 100

## 2013-03-02 NOTE — Consult Note (Signed)
[facility]    Date of Admission:  02/28/2013  Date of Consult:  03/02/2013  Reason for Consult: fever in pt with Right temporal/occipital parenchymal hematoma  Referring Physician: Dr. Catha Gosselin   HPI: Walter Larsen is an 77 y.o. male with past medical history significant for prior appendectomy prior lumbar surgery surgery hypertension which was poorly controlled and recent admission to [facility] hospital's for an apparent hemorrhagic stroke with bleed into his temporal/occipital parenchyma. He has had  (+) left hemianopsia from this.  After [facility] he apparently began having hallucinations and was confabulating. He had episodes of staring without responding to his family members. He had an episode of urinary incontinence and he become febrile. He was brought to [facility] and admitted to the hospitalist service. Blood cultures and urine cultures were obtained on admission he was started on vancomycin and Zosyn and later vancomycin and ceftriaxone. He is being followed closely by neurology. Repeat CT scan here continues to show his temporal, occipital lobe parenchymal hematoma.  We are consulted to help workup cause of fever in this patient with intraparenchymal hematoma--which could also be in fact an intraparenchymal brain abscess though not read as such on CT here or CT and MRI done at [facility].      Past Medical History  Diagnosis Date  . Stroke     History reviewed. No pertinent past surgical history.ergies:   No Known Allergies   Medications: I have reviewed patients current medications as documented in Epic Anti-infectives   Start     Dose/Rate Route Frequency Ordered Stop   03/02/13 1300  cefTRIAXone (ROCEPHIN) 2 g in dextrose 5 % 50 mL IVPB     2 g 100 mL/hr over 30 Minutes Intravenous Every 24 hours 03/02/13 1241     03/02/13 1300  metroNIDAZOLE (FLAGYL) IVPB 500 mg     500 mg 100 mL/hr  over 60 Minutes Intravenous Every 6 hours 03/02/13 1241     03/02/13 1300  vancomycin (VANCOCIN) IVPB 1000 mg/200 mL premix     1,000 mg 200 mL/hr over 60 Minutes Intravenous Every 12 hours 03/02/13 1252     03/01/13 1400  cefTRIAXone (ROCEPHIN) 2 g in dextrose 5 % 50 mL IVPB  Status:  Discontinued     2 g 100 mL/hr over 30 Minutes Intravenous Every 24 hours 03/01/13 1245 03/02/13 0952   03/01/13 1200  vancomycin (VANCOCIN) IVPB 1000 mg/200 mL premix  Status:  Discontinued     1,000 mg 200 mL/hr over 60 Minutes Intravenous Every 12 hours 03/01/13 0353 03/02/13 0952   03/01/13 0600  piperacillin-tazobactam (ZOSYN) IVPB 3.375 g  Status:  Discontinued     3.375 g 12.5 mL/hr over 240 Minutes Intravenous 3 times per day 03/01/13 0353 03/01/13 1245   02/28/13 2300  piperacillin-tazobactam (ZOSYN) IVPB 3.375 g     3.375 g 100 mL/hr over 30 Minutes Intravenous  Once 02/28/13 2249 03/01/13 0007   02/28/13 2300  vancomycin (VANCOCIN) IVPB 1000 mg/200 mL premix     1,000 mg 200 mL/hr over 60 Minutes Intravenous  Once 02/28/13 2249 03/01/13 0104      Social History:  reports that he has never smoked. He does not have any smokeless tobacco history on file. He reports that he does not drink alcohol or use illicit drugs.  Family History  Problem Relation Age of Onset  . Hypertension Mother   . Hypertension Father  As in HPI and primary teams notes otherwise 12 point review of systems is negative  Blood pressure 164/71, pulse 75, temperature 98.5 F (36.9 C), temperature source Oral, resp. rate 18, height 5\' 8"  (1.727 m), weight 180 lb (81.647 kg), SpO2 99.00%. General: Alert and awake, oriented x3, not in any acute distress. HEENT: anicteric sclera, pupils reactive to light and accommodation, EOMI, oropharynx clear and without exudate CVS regular rate, normal r,  no murmur rubs or gallops Chest: clear to auscultation bilaterally, no wheezing, rales or rhonchi Abdomen: soft nontender,  nondistended, normal bowel sounds, Extremities: no  clubbing or edema noted bilaterally Skin: no rashes Neuro: nonfocal, strength and sensation intact, CN intact with exception of visual field finding  He does also have pain in neck with flexion and headache concerning for meningismus   Results for orders placed during the hospital encounter of 02/28/13 (from the past 48 hour(s))  CBC WITH DIFFERENTIAL     Status: Abnormal   Collection Time    02/28/13 10:45 PM      Result Value Range   WBC 15.5 (*) 4.0 - 10.5 K/uL   RBC 4.52  4.22 - 5.81 MIL/uL   Hemoglobin 14.8  13.0 - 17.0 g/dL   HCT 16.1  09.6 - 04.5 %   MCV 91.2  78.0 - 100.0 fL   MCH 32.7  26.0 - 34.0 pg   MCHC 35.9  30.0 - 36.0 g/dL   RDW 40.9  81.1 - 91.4 %   Platelets 220  150 - 400 K/uL   Neutrophils Relative % 75  43 - 77 %   Neutro Abs 11.7 (*) 1.7 - 7.7 K/uL   Lymphocytes Relative 9 (*) 12 - 46 %   Lymphs Abs 1.4  0.7 - 4.0 K/uL   Monocytes Relative 16 (*) 3 - 12 %   Monocytes Absolute 2.4 (*) 0.1 - 1.0 K/uL   Eosinophils Relative 0  0 - 5 %   Eosinophils Absolute 0.0  0.0 - 0.7 K/uL   Basophils Relative 0  0 - 1 %   Basophils Absolute 0.0  0.0 - 0.1 K/uL  COMPREHENSIVE METABOLIC PANEL     Status: Abnormal   Collection Time    02/28/13 10:50 PM      Result Value Range   Sodium 131 (*) 135 - 145 mEq/L   Potassium 3.3 (*) 3.5 - 5.1 mEq/L   Chloride 97  96 - 112 mEq/L   CO2 22  19 - 32 mEq/L   Glucose, Bld 138 (*) 70 - 99 mg/dL   BUN 17  6 - 23 mg/dL   Creatinine, Ser 7.82  0.50 - 1.35 mg/dL   Calcium 9.4  8.4 - 95.6 mg/dL   Total Protein 7.3  6.0 - 8.3 g/dL   Albumin 3.4 (*) 3.5 - 5.2 g/dL   AST 37  0 - 37 U/L   ALT 32  0 - 53 U/L   Alkaline Phosphatase 41  39 - 117 U/L   Total Bilirubin 1.3 (*) 0.3 - 1.2 mg/dL   GFR calc non Af Amer 84 (*) >90 mL/min   GFR calc Af Amer >90  >90 mL/min   Comment: (NOTE)     The eGFR has been calculated using the CKD EPI equation.     This calculation has not been  validated in all clinical situations.     eGFR's persistently <90 mL/min signify possible Chronic Kidney     Disease.  CG4 I-STAT (LACTIC  ACID)     Status: None   Collection Time    02/28/13 11:01 PM      Result Value Range   Lactic Acid, Venous 1.10  0.5 - 2.2 mmol/L  CULTURE, BLOOD (ROUTINE X 2)     Status: None   Collection Time    02/28/13 11:35 PM      Result Value Range   Specimen Description BLOOD LEFT WRIST     Special Requests BOTTLES DRAWN AEROBIC AND ANAEROBIC 10CC EACH     Culture  Setup Time       Value: 03/01/2013 04:45     Performed at [facility]   Culture       Value:        BLOOD CULTURE RECEIVED NO GROWTH TO DATE CULTURE WILL BE HELD FOR 5 DAYS BEFORE ISSUING A FINAL NEGATIVE REPORT     Performed at [facility]   Report Status PENDING    CULTURE, BLOOD (ROUTINE X 2)     Status: None   Collection Time    02/28/13 11:40 PM      Result Value Range   Specimen Description BLOOD LEFT HAND     Special Requests BOTTLES DRAWN AEROBIC ONLY 5CC     Culture  Setup Time       Value: 03/01/2013 04:44     Performed at [facility]   Culture       Value:        BLOOD CULTURE RECEIVED NO GROWTH TO DATE CULTURE WILL BE HELD FOR 5 DAYS BEFORE ISSUING A FINAL NEGATIVE REPORT     Performed at [facility]   Report Status PENDING    URINALYSIS, ROUTINE W REFLEX MICROSCOPIC     Status: Abnormal   Collection Time    03/01/13 12:16 AM      Result Value Range   Color, Urine YELLOW  YELLOW   APPearance CLEAR  CLEAR   Specific Gravity, Urine 1.014  1.005 - 1.030   pH 7.0  5.0 - 8.0   Glucose, UA NEGATIVE  NEGATIVE mg/dL   Hgb urine dipstick NEGATIVE  NEGATIVE   Bilirubin Urine NEGATIVE  NEGATIVE   Ketones, ur NEGATIVE  NEGATIVE mg/dL   Protein, ur NEGATIVE  NEGATIVE mg/dL   Urobilinogen, UA 2.0 (*) 0.0 - 1.0 mg/dL   Nitrite NEGATIVE  NEGATIVE   Leukocytes, UA NEGATIVE  NEGATIVE   Comment: MICROSCOPIC NOT DONE ON URINES WITH NEGATIVE PROTEIN,  BLOOD, LEUKOCYTES, NITRITE, OR GLUCOSE <1000 mg/dL.  URINE CULTURE     Status: None   Collection Time    03/01/13 12:16 AM      Result Value Range   Specimen Description URINE, CLEAN CATCH     Special Requests NONE     Culture  Setup Time       Value: 03/01/2013 01:00     Performed at [facility] Count       Value: NO GROWTH     Performed at [facility]   Culture       Value: NO GROWTH     Performed at [facility]   Report Status 03/02/2013 FINAL    CBC WITH DIFFERENTIAL     Status: Abnormal   Collection Time    03/01/13  6:15 AM      Result Value Range   WBC 12.0 (*) 4.0 - 10.5 K/uL   RBC 4.39  4.22 - 5.81 MIL/uL  Hemoglobin 14.0  13.0 - 17.0 g/dL   HCT 45.4  09.8 - 11.9 %   MCV 91.1  78.0 - 100.0 fL   MCH 31.9  26.0 - 34.0 pg   MCHC 35.0  30.0 - 36.0 g/dL   RDW 14.7  82.9 - 56.2 %   Platelets 181  150 - 400 K/uL   Neutrophils Relative % 66  43 - 77 %   Neutro Abs 8.0 (*) 1.7 - 7.7 K/uL   Lymphocytes Relative 15  12 - 46 %   Lymphs Abs 1.8  0.7 - 4.0 K/uL   Monocytes Relative 19 (*) 3 - 12 %   Monocytes Absolute 2.2 (*) 0.1 - 1.0 K/uL   Eosinophils Relative 0  0 - 5 %   Eosinophils Absolute 0.0  0.0 - 0.7 K/uL   Basophils Relative 0  0 - 1 %   Basophils Absolute 0.0  0.0 - 0.1 K/uL  COMPREHENSIVE METABOLIC PANEL     Status: Abnormal   Collection Time    03/01/13  6:15 AM      Result Value Range   Sodium 137  135 - 145 mEq/L   Potassium 3.6  3.5 - 5.1 mEq/L   Chloride 104  96 - 112 mEq/L   CO2 20  19 - 32 mEq/L   Glucose, Bld 111 (*) 70 - 99 mg/dL   BUN 15  6 - 23 mg/dL   Creatinine, Ser 1.30  0.50 - 1.35 mg/dL   Calcium 8.7  8.4 - 86.5 mg/dL   Total Protein 6.8  6.0 - 8.3 g/dL   Albumin 3.0 (*) 3.5 - 5.2 g/dL   AST 31  0 - 37 U/L   ALT 28  0 - 53 U/L   Alkaline Phosphatase 39  39 - 117 U/L   Total Bilirubin 1.1  0.3 - 1.2 mg/dL   GFR calc non Af Amer 79 (*) >90 mL/min   GFR calc Af Amer >90  >90 mL/min   Comment: (NOTE)      The eGFR has been calculated using the CKD EPI equation.     This calculation has not been validated in all clinical situations.     eGFR's persistently <90 mL/min signify possible Chronic Kidney     Disease.  SEDIMENTATION RATE     Status: Abnormal   Collection Time    03/01/13  6:15 AM      Result Value Range   Sed Rate 27 (*) 0 - 16 mm/hr  TSH     Status: Abnormal   Collection Time    03/01/13  9:00 AM      Result Value Range   TSH 7.064 (*) 0.350 - 4.500 uIU/mL   Comment: Performed at [facility]  CBC     Status: Abnormal   Collection Time    03/02/13  4:14 AM      Result Value Range   WBC 11.2 (*) 4.0 - 10.5 K/uL   RBC 4.08 (*) 4.22 - 5.81 MIL/uL   Hemoglobin 13.2  13.0 - 17.0 g/dL   HCT 78.4 (*) 69.6 - 29.5 %   MCV 92.6  78.0 - 100.0 fL   MCH 32.4  26.0 - 34.0 pg   MCHC 34.9  30.0 - 36.0 g/dL   RDW 28.4  13.2 - 44.0 %   Platelets 182  150 - 400 K/uL  BASIC METABOLIC PANEL     Status: Abnormal   Collection Time  03/02/13  4:14 AM      Result Value Range   Sodium 134 (*) 135 - 145 mEq/L   Potassium 3.3 (*) 3.5 - 5.1 mEq/L   Chloride 101  96 - 112 mEq/L   CO2 21  19 - 32 mEq/L   Glucose, Bld 128 (*) 70 - 99 mg/dL   BUN 14  6 - 23 mg/dL   Creatinine, Ser 1.61  0.50 - 1.35 mg/dL   Calcium 8.5  8.4 - 09.6 mg/dL   GFR calc non Af Amer 85 (*) >90 mL/min   GFR calc Af Amer >90  >90 mL/min   Comment: (NOTE)     The eGFR has been calculated using the CKD EPI equation.     This calculation has not been validated in all clinical situations.     eGFR's persistently <90 mL/min signify possible Chronic Kidney     Disease.      Component Value Date/Time   SDES URINE, CLEAN CATCH 03/01/2013 0016   SPECREQUEST NONE 03/01/2013 0016   CULT  Value: NO GROWTH Performed at [facility] 03/01/2013 0016   REPTSTATUS 03/02/2013 FINAL 03/01/2013 0016   Dg Chest 2 View  03/01/2013   CLINICAL DATA:  Fever.  EXAM: CHEST - 2 VIEW  COMPARISON:  02/28/2013   FINDINGS: The heart size and mediastinal contours are within normal limits. There is no evidence of pulmonary edema, consolidation, pneumothorax, nodule or pleural fluid. Stable degenerative changes are seen in the thoracic spine  IMPRESSION: No active disease.   Electronically Signed   By: Irish Lack M.D.   On: 03/01/2013 16:35   Ct Head Wo Contrast  02/28/2013   CLINICAL DATA:  Altered mental status.  Stroke.  EXAM: CT HEAD WITHOUT CONTRAST  TECHNIQUE: Contiguous axial images were obtained from the base of the skull through the vertex without intravenous contrast.  COMPARISON:  None.  FINDINGS: Skull and Sinuses:No significant abnormality.  Orbits: Bilateral cataract resection.  Brain: Acute hemorrhage in the posterior right cerebral hemisphere, located in the posterior temporal and occipital regions. The hematoma is irregularly shaped, but the entire area of abnormality measures 7cm in length, 3 cm in width. There is a moderate amount of surrounding cerebral edema. Minimal intraventricular extension into the right lateral ventricle. No herniation, midline shift, or hydrocephalus.  Chronic small vessel ischemic white matter disease with patchy bilateral cerebral white matter low density. Extensive intracranial atherosclerotic calcification.  Critical Value/emergent results were called by telephone at the time of interpretation on 02/28/2013 at 11:13 PM to Dr.JOSHUA ZAVITZ , who verbally acknowledged these results.  IMPRESSION: 1. Right temporal/occipital parenchymal hematoma, measuring up to 7 cm in length. Trace intraventricular extension; no hydrocephalus. 2. Chronic small vessel ischemic white matter disease.   Electronically Signed   By: Tiburcio Pea M.D.   On: 02/28/2013 23:16   Dg Chest Portable 1 View  02/28/2013   CLINICAL DATA:  Altered mental status.  EXAM: PORTABLE CHEST - 1 VIEW  COMPARISON:  None.  FINDINGS: The heart size and mediastinal contours are within normal limits. Both lungs  are clear. The visualized skeletal structures are unremarkable.  IMPRESSION: No active disease.   Electronically Signed   By: Tiburcio Pea M.D.   On: 02/28/2013 23:03     Recent Results (from the past 720 hour(s))  CULTURE, BLOOD (ROUTINE X 2)     Status: None   Collection Time    02/28/13 11:35 PM      Result Value  Range Status   Specimen Description BLOOD LEFT WRIST   Final   Special Requests BOTTLES DRAWN AEROBIC AND ANAEROBIC 10CC EACH   Final   Culture  Setup Time     Final   Value: 03/01/2013 04:45     Performed at [facility]   Culture     Final   Value:        BLOOD CULTURE RECEIVED NO GROWTH TO DATE CULTURE WILL BE HELD FOR 5 DAYS BEFORE ISSUING A FINAL NEGATIVE REPORT     Performed at [facility]   Report Status PENDING   Incomplete  CULTURE, BLOOD (ROUTINE X 2)     Status: None   Collection Time    02/28/13 11:40 PM      Result Value Range Status   Specimen Description BLOOD LEFT HAND   Final   Special Requests BOTTLES DRAWN AEROBIC ONLY 5CC   Final   Culture  Setup Time     Final   Value: 03/01/2013 04:44     Performed at [facility]   Culture     Final   Value:        BLOOD CULTURE RECEIVED NO GROWTH TO DATE CULTURE WILL BE HELD FOR 5 DAYS BEFORE ISSUING A FINAL NEGATIVE REPORT     Performed at [facility]   Report Status PENDING   Incomplete  URINE CULTURE     Status: None   Collection Time    03/01/13 12:16 AM      Result Value Range Status   Specimen Description URINE, CLEAN CATCH   Final   Special Requests NONE   Final   Culture  Setup Time     Final   Value: 03/01/2013 01:00     Performed at [facility] Count     Final   Value: NO GROWTH     Performed at [facility]   Culture     Final   Value: NO GROWTH     Performed at [facility]   Report Status 03/02/2013 FINAL   Final     Impression/Recommendation  77 year old man with recent admission [facility]  with Right temporal/occipital parenchymal hematoma dc and then admitted with fevers, headaches and confusion  #1 Fevers: My concern esp given his pain with neck flexion could be that he in fact has a BRAIN abscess or superinfected hematoma with extension into the meninges  --Given concern fro meningitis I will restart him on IV vancomycin, I do ceftriaxone 2 g twice daily and 3 times daily Flagyl IV  --Recommend consult in neurosurgery to evaluate this area in the occipital temporal lobes, see if they think this might in fact be an abscess or superinfected hematoma that will require stereotactic drainage versus other neurosurgical approach  --If this is done it would be helpful to get bacterial and anaerobic cultures as well as AFB and fungal cultures.  --Also reconsider checking another MRI of the brain in comparison to the one done at [facility]. The patient himself suffers from significant claustrophobia would need to be sedated for this.  He could certainly have fevers from bleeding into parenchma ALONE and not have infection but given the concern for meningitis I am comfortable simply observing him at this time.  I spent greater than 60 minutes with the patient including greater than 50% of time in face to face counsel of the patient and in coordination of  their care.    #2 Screening: Check hepatitis panel and HIV     Thank you so much for this interesting consult  Dr. Ninetta Lights is covering for me tomorrow.   [facility] [phone] (pager) [phone] (office) 03/02/2013, 2:02 PM  Paulette Blanch Dam 03/02/2013, 2:02 PM

## 2013-03-02 NOTE — Progress Notes (Signed)
NEURO HOSPITALIST PROGRESS NOTE   SUBJECTIVE:                                                                                                                        Patient is doing well, no confusion at present time but did have a period of confusion last night (at that time Temperature was noted to be elevated again).  Continues to have neck discomfort with chin to chest but no problems looking to the left and right.   OBJECTIVE:                                                                                                                           Vital signs in last 24 hours: Temp:  [97.4 F (36.3 C)-101.9 F (38.8 C)] 98.4 F (36.9 C) (11/19 1005) Pulse Rate:  [65-77] 66 (11/19 1005) Resp:  [18-20] 18 (11/19 1005) BP: (150-178)/(52-75) 163/70 mmHg (11/19 1005) SpO2:  [94 %-100 %] 98 % (11/19 1005)  Intake/Output from previous day: 11/18 0701 - 11/19 0700 In: 2083.8 [P.O.:360; I.V.:993.8; IV Piggyback:250] Out: 200 [Urine:200] Intake/Output this shift:   Nutritional status: Cardiac  Past Medical History  Diagnosis Date  . Stroke     Neurologic Exam:   Mental Status:  Alert, oriented, thought content appropriate. Speech fluent without evidence of aphasia. Able to follow 3 step commands without difficulty. Able to name objects and tell what they are used for.  Cranial Nerves:  II: Visual fields (+) left hemianopsia, pupils equal, round, reactive to light and accommodation  III,IV, VI: ptosis not present, extra-ocular motions intact bilaterally  V,VII: smile symmetric, facial light touch sensation normal bilaterally  VIII: hearing normal bilaterally  IX,X: gag reflex present  XI: bilateral shoulder shrug  XII: midline tongue extension without atrophy or fasciculations  Motor:  5/5 througout Tone and bulk:normal tone throughout; no atrophy noted  Sensory: Pinprick and light touch intact throughout, bilaterally  Deep Tendon  Reflexes:  2+ throughout Plantars:  Right: downgoing  Left: downgoing  Cerebellar:  normal finger-to-nose, normal heel-to-shin test  Gait: not tested  CV: pulses palpable throughout   Lab Results: No results found for this basename: cbc, bmp, coags, chol, tri, ldl, hga1c   Lipid Panel No  results found for this basename: CHOL, TRIG, HDL, CHOLHDL, VLDL, LDLCALC,  in the last 72 hours  Studies/Results: Dg Chest 2 View  03/01/2013   CLINICAL DATA:  Fever.  EXAM: CHEST - 2 VIEW  COMPARISON:  02/28/2013  FINDINGS: The heart size and mediastinal contours are within normal limits. There is no evidence of pulmonary edema, consolidation, pneumothorax, nodule or pleural fluid. Stable degenerative changes are seen in the thoracic spine  IMPRESSION: No active disease.   Electronically Signed   By: Irish Lack M.D.   On: 03/01/2013 16:35   Ct Head Wo Contrast  02/28/2013   CLINICAL DATA:  Altered mental status.  Stroke.  EXAM: CT HEAD WITHOUT CONTRAST  TECHNIQUE: Contiguous axial images were obtained from the base of the skull through the vertex without intravenous contrast.  COMPARISON:  None.  FINDINGS: Skull and Sinuses:No significant abnormality.  Orbits: Bilateral cataract resection.  Brain: Acute hemorrhage in the posterior right cerebral hemisphere, located in the posterior temporal and occipital regions. The hematoma is irregularly shaped, but the entire area of abnormality measures 7cm in length, 3 cm in width. There is a moderate amount of surrounding cerebral edema. Minimal intraventricular extension into the right lateral ventricle. No herniation, midline shift, or hydrocephalus.  Chronic small vessel ischemic white matter disease with patchy bilateral cerebral white matter low density. Extensive intracranial atherosclerotic calcification.  Critical Value/emergent results were called by telephone at the time of interpretation on 02/28/2013 at 11:13 PM to Dr.JOSHUA ZAVITZ , who verbally  acknowledged these results.  IMPRESSION: 1. Right temporal/occipital parenchymal hematoma, measuring up to 7 cm in length. Trace intraventricular extension; no hydrocephalus. 2. Chronic small vessel ischemic white matter disease.   Electronically Signed   By: Tiburcio Pea M.D.   On: 02/28/2013 23:16   Dg Chest Portable 1 View  02/28/2013   CLINICAL DATA:  Altered mental status.  EXAM: PORTABLE CHEST - 1 VIEW  COMPARISON:  None.  FINDINGS: The heart size and mediastinal contours are within normal limits. Both lungs are clear. The visualized skeletal structures are unremarkable.  IMPRESSION: No active disease.   Electronically Signed   By: Tiburcio Pea M.D.   On: 02/28/2013 23:03   EEG: No epileptiform activity  MEDICATIONS                                                                                                                        Scheduled: . atorvastatin  20 mg Oral QHS  . feeding supplement (ENSURE COMPLETE)  237 mL Oral TID  . pantoprazole  40 mg Oral Daily    ASSESSMENT/PLAN:  77 YO male with recent right temporal/occipital ICH which is unchanged since prior scan. Presented to ED with confusion in setting of elevated temperature 103 and elevated BP.  Patient has improved as Temperature and BP controlled.  Etiology of Temperature unclear.  Would not attempt LP at this time, as results would not be reliable. The intraventricular blood would cause a in accurate increase in CSF WBC count.   ID has been consulted  Recommend: 1) Continue ABX 2) ID consulted 3) Will follow, no further recommendations at this time as patient is improving in mental status.    Assessment and plan discussed with with attending physician and they are in agreement.    Felicie Morn PA-C [facility] [phone]  03/02/2013, 10:44 AM

## 2013-03-02 NOTE — Progress Notes (Signed)
[facility]                                                                                Patient Demographics  Walter Larsen, is a 77 y.o. male, DOB - 16-Jun-1935, ZOX:[id number]  Admit date - 02/28/2013   Admitting Physician Lynden Oxford, MD  Outpatient Primary MD for the patient is Provider Not In System  LOS - 2   Chief Complaint  Patient presents with  . Altered Mental Status        Assessment & Plan   Principal Problem:   Fever, unspecified Active Problems:   Hemorrhagic stroke   Essential hypertension, benign  Fever -Unknown etiology, may be secondary to Recent Temporal/occiptal hematoma -Urine analysis negative -Pending urine and blood cultures.   -Continue empiric antibiotic coverage with vancomycin and ceftrixone. -ID consulted   Hypokalemia -K 3.3, will replace and recheck BMP in the morning  Right temporal/occipital parenchymal hematoma -Neurosurgery consulted again.  (Message left for Dr. Danielle Dess 03/01/2013) -Neurology consulted as well. -CT Scan: Right temporal/occipital parenchymal hematoma, measuring up to 7 cm in length. Trace intraventricular extension; no hydrocephalus.  Encephalopathy/ AMS -Resolved, no longer altered, at baseline -Neurology following  -EEG: This is a normal EEG recording during sleep. No evidence of an epileptic disorder was seen.  Bradycardia -Asymptomatic. Hold Metoprolol held  HTN - Metoprolol held.  Continue hydralazine PRN.  Hyperlipidemia -Continue atorvastatin   Code Status: Full  Family Communication: Wife at bedside  Disposition Plan: Admitted.    Procedures -EEG: This is a normal EEG recording during sleep. No evidence of an epileptic disorder was seen.  Consults   Neurology Infectious Disease  DVT Prophylaxis  SCDs  Lab Results  Component Value Date   PLT 182 03/02/2013    Medications  Scheduled Meds: . atorvastatin  20 mg Oral QHS  . feeding supplement (ENSURE COMPLETE)  237 mL Oral  TID  . pantoprazole  40 mg Oral Daily   Continuous Infusions: . sodium chloride 100 mL/hr at 03/02/13 0548   PRN Meds:.acetaminophen, hydrALAZINE, ondansetron (ZOFRAN) IV, traMADol  Antibiotics    Anti-infectives   Start     Dose/Rate Route Frequency Ordered Stop   03/01/13 1400  cefTRIAXone (ROCEPHIN) 2 g in dextrose 5 % 50 mL IVPB  Status:  Discontinued     2 g 100 mL/hr over 30 Minutes Intravenous Every 24 hours 03/01/13 1245 03/02/13 0952   03/01/13 1200  vancomycin (VANCOCIN) IVPB 1000 mg/200 mL premix  Status:  Discontinued     1,000 mg 200 mL/hr over 60 Minutes Intravenous Every 12 hours 03/01/13 0353 03/02/13 0952   03/01/13 0600  piperacillin-tazobactam (ZOSYN) IVPB 3.375 g  Status:  Discontinued     3.375 g 12.5 mL/hr over 240 Minutes Intravenous 3 times per day 03/01/13 0353 03/01/13 1245   02/28/13 2300  piperacillin-tazobactam (ZOSYN) IVPB 3.375 g     3.375 g 100 mL/hr over 30 Minutes Intravenous  Once 02/28/13 2249 03/01/13 0007   02/28/13 2300  vancomycin (VANCOCIN) IVPB 1000 mg/200 mL premix     1,000 mg 200 mL/hr over 60 Minutes Intravenous  Once 02/28/13 2249 03/01/13 0104       Time  Spent in minutes   30 minutes   Durga Saldarriaga D.O. on 03/02/2013 at 12:40 PM  Between 7am to 7pm - Pager - [phone]  After 7pm go to [email or web address] - password [email or web address]  And look for the night coverage person covering for me after hours  [facility] Office  [phone]    Subjective:   [name] seen and examined today.  Patient has no new complaints at this time.  He still states he has fevers and has been using an ice pack to cool down.  He continues to complain of neck pain. Patient denies dizziness, chest pain, shortness of breath, abdominal pain, N/V/D/C, new weakness, numbess, tingling.    Objective:   Filed Vitals:   03/02/13 0144 03/02/13 0215 03/02/13 0621 03/02/13 1005  BP: 170/75 162/63 170/64 163/70  Pulse: 66  77 66  Temp: 100.1 F  (37.8 C)  98.3 F (36.8 C) 98.4 F (36.9 C)  TempSrc: Oral  Oral Oral  Resp: 20  20 18   Height:      Weight:      SpO2: 96%  98% 98%    Wt Readings from Last 3 Encounters:  03/01/13 81.647 kg (180 lb)     Intake/Output Summary (Last 24 hours) at 03/02/13 1240 Last data filed at 03/01/13 1507  Gross per 24 hour  Intake 1163.75 ml  Output    200 ml  Net 963.75 ml    Exam  General: Well developed, well nourished, NAD, appears stated age  HEENT: NCAT, PERRLA, EOMI, Anicteic Sclera, mucous membranes moist. No pharyngeal erythema or exudates  Neck: Supple, no JVD, no masses  Cardiovascular: S1 S2 auscultated, no rubs, murmurs or gallops. Regular rate and rhythm.  Respiratory: Clear to auscultation bilaterally with equal chest rise  Abdomen: Soft, nontender, nondistended, + bowel sounds  Extremities: warm dry without cyanosis clubbing or edema  Neuro: AAOx3, cranial nerves grossly intact. Strength 5/5 in patient's upper and lower extremities bilaterally  Skin: Without rashes exudates or nodules  Psych: Normal affect and demeanor with intact judgement and insight  Data Review   Micro Results Recent Results (from the past 240 hour(s))  CULTURE, BLOOD (ROUTINE X 2)     Status: None   Collection Time    02/28/13 11:35 PM      Result Value Range Status   Specimen Description BLOOD LEFT WRIST   Final   Special Requests BOTTLES DRAWN AEROBIC AND ANAEROBIC 10CC EACH   Final   Culture  Setup Time     Final   Value: 03/01/2013 04:45     Performed at [facility]   Culture     Final   Value:        BLOOD CULTURE RECEIVED NO GROWTH TO DATE CULTURE WILL BE HELD FOR 5 DAYS BEFORE ISSUING A FINAL NEGATIVE REPORT     Performed at [facility]   Report Status PENDING   Incomplete  CULTURE, BLOOD (ROUTINE X 2)     Status: None   Collection Time    02/28/13 11:40 PM      Result Value Range Status   Specimen Description BLOOD LEFT HAND   Final   Special  Requests BOTTLES DRAWN AEROBIC ONLY 5CC   Final   Culture  Setup Time     Final   Value: 03/01/2013 04:44     Performed at [facility]   Culture     Final   Value:  BLOOD CULTURE RECEIVED NO GROWTH TO DATE CULTURE WILL BE HELD FOR 5 DAYS BEFORE ISSUING A FINAL NEGATIVE REPORT     Performed at [facility]   Report Status PENDING   Incomplete  URINE CULTURE     Status: None   Collection Time    03/01/13 12:16 AM      Result Value Range Status   Specimen Description URINE, CLEAN CATCH   Final   Special Requests NONE   Final   Culture  Setup Time     Final   Value: 03/01/2013 01:00     Performed at [facility] Count     Final   Value: NO GROWTH     Performed at [facility]   Culture     Final   Value: NO GROWTH     Performed at [facility]   Report Status 03/02/2013 FINAL   Final    Radiology Reports Dg Chest 2 View  03/01/2013   CLINICAL DATA:  Fever.  EXAM: CHEST - 2 VIEW  COMPARISON:  02/28/2013  FINDINGS: The heart size and mediastinal contours are within normal limits. There is no evidence of pulmonary edema, consolidation, pneumothorax, nodule or pleural fluid. Stable degenerative changes are seen in the thoracic spine  IMPRESSION: No active disease.   Electronically Signed   By: Irish Lack M.D.   On: 03/01/2013 16:35   Ct Head Wo Contrast  02/28/2013   CLINICAL DATA:  Altered mental status.  Stroke.  EXAM: CT HEAD WITHOUT CONTRAST  TECHNIQUE: Contiguous axial images were obtained from the base of the skull through the vertex without intravenous contrast.  COMPARISON:  None.  FINDINGS: Skull and Sinuses:No significant abnormality.  Orbits: Bilateral cataract resection.  Brain: Acute hemorrhage in the posterior right cerebral hemisphere, located in the posterior temporal and occipital regions. The hematoma is irregularly shaped, but the entire area of abnormality measures 7cm in length, 3 cm in width. There is a  moderate amount of surrounding cerebral edema. Minimal intraventricular extension into the right lateral ventricle. No herniation, midline shift, or hydrocephalus.  Chronic small vessel ischemic white matter disease with patchy bilateral cerebral white matter low density. Extensive intracranial atherosclerotic calcification.  Critical Value/emergent results were called by telephone at the time of interpretation on 02/28/2013 at 11:13 PM to Dr.JOSHUA ZAVITZ , who verbally acknowledged these results.  IMPRESSION: 1. Right temporal/occipital parenchymal hematoma, measuring up to 7 cm in length. Trace intraventricular extension; no hydrocephalus. 2. Chronic small vessel ischemic white matter disease.   Electronically Signed   By: Tiburcio Pea M.D.   On: 02/28/2013 23:16   Dg Chest Portable 1 View  02/28/2013   CLINICAL DATA:  Altered mental status.  EXAM: PORTABLE CHEST - 1 VIEW  COMPARISON:  None.  FINDINGS: The heart size and mediastinal contours are within normal limits. Both lungs are clear. The visualized skeletal structures are unremarkable.  IMPRESSION: No active disease.   Electronically Signed   By: Tiburcio Pea M.D.   On: 02/28/2013 23:03    CBC  Recent Labs Lab 02/28/13 2245 03/01/13 0615 03/02/13 0414  WBC 15.5* 12.0* 11.2*  HGB 14.8 14.0 13.2  HCT 41.2 40.0 37.8*  PLT 220 181 182  MCV 91.2 91.1 92.6  MCH 32.7 31.9 32.4  MCHC 35.9 35.0 34.9  RDW 12.9 13.0 13.3  LYMPHSABS 1.4 1.8  --   MONOABS 2.4* 2.2*  --   EOSABS 0.0 0.0  --  BASOSABS 0.0 0.0  --     Chemistries   Recent Labs Lab 02/28/13 2250 03/01/13 0615 03/02/13 0414  NA 131* 137 134*  K 3.3* 3.6 3.3*  CL 97 104 101  CO2 22 20 21   GLUCOSE 138* 111* 128*  BUN 17 15 14   CREATININE 0.85 0.98 0.82  CALCIUM 9.4 8.7 8.5  AST 37 31  --   ALT 32 28  --   ALKPHOS 41 39  --   BILITOT 1.3* 1.1  --     ------------------------------------------------------------------------------------------------------------------ estimated creatinine clearance is 76.5 ml/min (by C-G formula based on Cr of 0.82). ------------------------------------------------------------------------------------------------------------------ No results found for this basename: HGBA1C,  in the last 72 hours ------------------------------------------------------------------------------------------------------------------ No results found for this basename: CHOL, HDL, LDLCALC, TRIG, CHOLHDL, LDLDIRECT,  in the last 72 hours ------------------------------------------------------------------------------------------------------------------  Recent Labs  03/01/13 0900  TSH 7.064*   ------------------------------------------------------------------------------------------------------------------ No results found for this basename: VITAMINB12, FOLATE, FERRITIN, TIBC, IRON, RETICCTPCT,  in the last 72 hours  Coagulation profile No results found for this basename: INR, PROTIME,  in the last 168 hours  No results found for this basename: DDIMER,  in the last 72 hours  Cardiac Enzymes No results found for this basename: CK, CKMB, TROPONINI, MYOGLOBIN,  in the last 168 hours ------------------------------------------------------------------------------------------------------------------ No components found with this basename: POCBNP,

## 2013-03-02 NOTE — Consult Note (Signed)
Reason for Consult:Right parietal ICH, fever Referring Walter Larsen is an 77 y.o. male.  HPI: Pt is a  77 yo right handed male who presented to [facility] with and intraparenchymal bleed in the right posterior  Parietal lobe. He was hospitalized for 3 days and discharged home this past Sunday. A day later he developed fever and was seen in the [facility] system. A CT shows the right parietal hemorrhage with minimal mass effect and a trace amount of ventricular hemorrhage. The fever is now beig worked up.  Past Medical History  Diagnosis Date  . Stroke     History reviewed. No pertinent past surgical history.  Family History  Problem Relation Age of Onset  . Hypertension Mother   . Hypertension Father     Social History:  reports that he has never smoked. He does not have any smokeless tobacco history on file. He reports that he does not drink alcohol or use illicit drugs.  Allergies: No Known Allergies  Medications: I have reviewed the patient's current medications.  Results for orders placed during the hospital encounter of 02/28/13 (from the past 48 hour(s))  CBC WITH DIFFERENTIAL     Status: Abnormal   Collection Time    02/28/13 10:45 PM      Result Value Range   WBC 15.5 (*) 4.0 - 10.5 K/uL   RBC 4.52  4.22 - 5.81 MIL/uL   Hemoglobin 14.8  13.0 - 17.0 g/dL   HCT 30.8  65.7 - 84.6 %   MCV 91.2  78.0 - 100.0 fL   MCH 32.7  26.0 - 34.0 pg   MCHC 35.9  30.0 - 36.0 g/dL   RDW 96.2  95.2 - 84.1 %   Platelets 220  150 - 400 K/uL   Neutrophils Relative % 75  43 - 77 %   Neutro Abs 11.7 (*) 1.7 - 7.7 K/uL   Lymphocytes Relative 9 (*) 12 - 46 %   Lymphs Abs 1.4  0.7 - 4.0 K/uL   Monocytes Relative 16 (*) 3 - 12 %   Monocytes Absolute 2.4 (*) 0.1 - 1.0 K/uL   Eosinophils Relative 0  0 - 5 %   Eosinophils Absolute 0.0  0.0 - 0.7 K/uL   Basophils Relative 0  0 - 1 %   Basophils Absolute 0.0  0.0 - 0.1 K/uL  COMPREHENSIVE METABOLIC PANEL     Status: Abnormal    Collection Time    02/28/13 10:50 PM      Result Value Range   Sodium 131 (*) 135 - 145 mEq/L   Potassium 3.3 (*) 3.5 - 5.1 mEq/L   Chloride 97  96 - 112 mEq/L   CO2 22  19 - 32 mEq/L   Glucose, Bld 138 (*) 70 - 99 mg/dL   BUN 17  6 - 23 mg/dL   Creatinine, Ser 3.24  0.50 - 1.35 mg/dL   Calcium 9.4  8.4 - 40.1 mg/dL   Total Protein 7.3  6.0 - 8.3 g/dL   Albumin 3.4 (*) 3.5 - 5.2 g/dL   AST 37  0 - 37 U/L   ALT 32  0 - 53 U/L   Alkaline Phosphatase 41  39 - 117 U/L   Total Bilirubin 1.3 (*) 0.3 - 1.2 mg/dL   GFR calc non Af Amer 84 (*) >90 mL/min   GFR calc Af Amer >90  >90 mL/min   Comment: (NOTE)     The eGFR has  been calculated using the CKD EPI equation.     This calculation has not been validated in all clinical situations.     eGFR's persistently <90 mL/min signify possible Chronic Kidney     Disease.  CG4 I-STAT (LACTIC ACID)     Status: None   Collection Time    02/28/13 11:01 PM      Result Value Range   Lactic Acid, Venous 1.10  0.5 - 2.2 mmol/L  CULTURE, BLOOD (ROUTINE X 2)     Status: None   Collection Time    02/28/13 11:35 PM      Result Value Range   Specimen Description BLOOD LEFT WRIST     Special Requests BOTTLES DRAWN AEROBIC AND ANAEROBIC 10CC EACH     Culture  Setup Time       Value: 03/01/2013 04:45     Performed at [facility]   Culture       Value:        BLOOD CULTURE RECEIVED NO GROWTH TO DATE CULTURE WILL BE HELD FOR 5 DAYS BEFORE ISSUING A FINAL NEGATIVE REPORT     Performed at [facility]   Report Status PENDING    CULTURE, BLOOD (ROUTINE X 2)     Status: None   Collection Time    02/28/13 11:40 PM      Result Value Range   Specimen Description BLOOD LEFT HAND     Special Requests BOTTLES DRAWN AEROBIC ONLY 5CC     Culture  Setup Time       Value: 03/01/2013 04:44     Performed at [facility]   Culture       Value:        BLOOD CULTURE RECEIVED NO GROWTH TO DATE CULTURE WILL BE HELD FOR 5 DAYS BEFORE ISSUING A  FINAL NEGATIVE REPORT     Performed at [facility]   Report Status PENDING    URINALYSIS, ROUTINE W REFLEX MICROSCOPIC     Status: Abnormal   Collection Time    03/01/13 12:16 AM      Result Value Range   Color, Urine YELLOW  YELLOW   APPearance CLEAR  CLEAR   Specific Gravity, Urine 1.014  1.005 - 1.030   pH 7.0  5.0 - 8.0   Glucose, UA NEGATIVE  NEGATIVE mg/dL   Hgb urine dipstick NEGATIVE  NEGATIVE   Bilirubin Urine NEGATIVE  NEGATIVE   Ketones, ur NEGATIVE  NEGATIVE mg/dL   Protein, ur NEGATIVE  NEGATIVE mg/dL   Urobilinogen, UA 2.0 (*) 0.0 - 1.0 mg/dL   Nitrite NEGATIVE  NEGATIVE   Leukocytes, UA NEGATIVE  NEGATIVE   Comment: MICROSCOPIC NOT DONE ON URINES WITH NEGATIVE PROTEIN, BLOOD, LEUKOCYTES, NITRITE, OR GLUCOSE <1000 mg/dL.  URINE CULTURE     Status: None   Collection Time    03/01/13 12:16 AM      Result Value Range   Specimen Description URINE, CLEAN CATCH     Special Requests NONE     Culture  Setup Time       Value: 03/01/2013 01:00     Performed at [facility] Count       Value: NO GROWTH     Performed at [facility]   Culture       Value: NO GROWTH     Performed at [facility]   Report Status 03/02/2013 FINAL    CBC WITH DIFFERENTIAL  Status: Abnormal   Collection Time    03/01/13  6:15 AM      Result Value Range   WBC 12.0 (*) 4.0 - 10.5 K/uL   RBC 4.39  4.22 - 5.81 MIL/uL   Hemoglobin 14.0  13.0 - 17.0 g/dL   HCT 16.1  09.6 - 04.5 %   MCV 91.1  78.0 - 100.0 fL   MCH 31.9  26.0 - 34.0 pg   MCHC 35.0  30.0 - 36.0 g/dL   RDW 40.9  81.1 - 91.4 %   Platelets 181  150 - 400 K/uL   Neutrophils Relative % 66  43 - 77 %   Neutro Abs 8.0 (*) 1.7 - 7.7 K/uL   Lymphocytes Relative 15  12 - 46 %   Lymphs Abs 1.8  0.7 - 4.0 K/uL   Monocytes Relative 19 (*) 3 - 12 %   Monocytes Absolute 2.2 (*) 0.1 - 1.0 K/uL   Eosinophils Relative 0  0 - 5 %   Eosinophils Absolute 0.0  0.0 - 0.7 K/uL   Basophils Relative 0   0 - 1 %   Basophils Absolute 0.0  0.0 - 0.1 K/uL  COMPREHENSIVE METABOLIC PANEL     Status: Abnormal   Collection Time    03/01/13  6:15 AM      Result Value Range   Sodium 137  135 - 145 mEq/L   Potassium 3.6  3.5 - 5.1 mEq/L   Chloride 104  96 - 112 mEq/L   CO2 20  19 - 32 mEq/L   Glucose, Bld 111 (*) 70 - 99 mg/dL   BUN 15  6 - 23 mg/dL   Creatinine, Ser 7.82  0.50 - 1.35 mg/dL   Calcium 8.7  8.4 - 95.6 mg/dL   Total Protein 6.8  6.0 - 8.3 g/dL   Albumin 3.0 (*) 3.5 - 5.2 g/dL   AST 31  0 - 37 U/L   ALT 28  0 - 53 U/L   Alkaline Phosphatase 39  39 - 117 U/L   Total Bilirubin 1.1  0.3 - 1.2 mg/dL   GFR calc non Af Amer 79 (*) >90 mL/min   GFR calc Af Amer >90  >90 mL/min   Comment: (NOTE)     The eGFR has been calculated using the CKD EPI equation.     This calculation has not been validated in all clinical situations.     eGFR's persistently <90 mL/min signify possible Chronic Kidney     Disease.  SEDIMENTATION RATE     Status: Abnormal   Collection Time    03/01/13  6:15 AM      Result Value Range   Sed Rate 27 (*) 0 - 16 mm/hr  TSH     Status: Abnormal   Collection Time    03/01/13  9:00 AM      Result Value Range   TSH 7.064 (*) 0.350 - 4.500 uIU/mL   Comment: Performed at [facility]  CBC     Status: Abnormal   Collection Time    03/02/13  4:14 AM      Result Value Range   WBC 11.2 (*) 4.0 - 10.5 K/uL   RBC 4.08 (*) 4.22 - 5.81 MIL/uL   Hemoglobin 13.2  13.0 - 17.0 g/dL   HCT 21.3 (*) 08.6 - 57.8 %   MCV 92.6  78.0 - 100.0 fL   MCH 32.4  26.0 - 34.0 pg   MCHC  34.9  30.0 - 36.0 g/dL   RDW 16.1  09.6 - 04.5 %   Platelets 182  150 - 400 K/uL  BASIC METABOLIC PANEL     Status: Abnormal   Collection Time    03/02/13  4:14 AM      Result Value Range   Sodium 134 (*) 135 - 145 mEq/L   Potassium 3.3 (*) 3.5 - 5.1 mEq/L   Chloride 101  96 - 112 mEq/L   CO2 21  19 - 32 mEq/L   Glucose, Bld 128 (*) 70 - 99 mg/dL   BUN 14  6 - 23 mg/dL   Creatinine,  Ser 4.09  0.50 - 1.35 mg/dL   Calcium 8.5  8.4 - 81.1 mg/dL   GFR calc non Af Amer 85 (*) >90 mL/min   GFR calc Af Amer >90  >90 mL/min   Comment: (NOTE)     The eGFR has been calculated using the CKD EPI equation.     This calculation has not been validated in all clinical situations.     eGFR's persistently <90 mL/min signify possible Chronic Kidney     Disease.    Dg Chest 2 View  03/01/2013   CLINICAL DATA:  Fever.  EXAM: CHEST - 2 VIEW  COMPARISON:  02/28/2013  FINDINGS: The heart size and mediastinal contours are within normal limits. There is no evidence of pulmonary edema, consolidation, pneumothorax, nodule or pleural fluid. Stable degenerative changes are seen in the thoracic spine  IMPRESSION: No active disease.   Electronically Signed   By: Irish Lack M.D.   On: 03/01/2013 16:35   Ct Head Wo Contrast  02/28/2013   CLINICAL DATA:  Altered mental status.  Stroke.  EXAM: CT HEAD WITHOUT CONTRAST  TECHNIQUE: Contiguous axial images were obtained from the base of the skull through the vertex without intravenous contrast.  COMPARISON:  None.  FINDINGS: Skull and Sinuses:No significant abnormality.  Orbits: Bilateral cataract resection.  Brain: Acute hemorrhage in the posterior right cerebral hemisphere, located in the posterior temporal and occipital regions. The hematoma is irregularly shaped, but the entire area of abnormality measures 7cm in length, 3 cm in width. There is a moderate amount of surrounding cerebral edema. Minimal intraventricular extension into the right lateral ventricle. No herniation, midline shift, or hydrocephalus.  Chronic small vessel ischemic white matter disease with patchy bilateral cerebral white matter low density. Extensive intracranial atherosclerotic calcification.  Critical Value/emergent results were called by telephone at the time of interpretation on 02/28/2013 at 11:13 PM to Dr.JOSHUA ZAVITZ , who verbally acknowledged these results.  IMPRESSION: 1.  Right temporal/occipital parenchymal hematoma, measuring up to 7 cm in length. Trace intraventricular extension; no hydrocephalus. 2. Chronic small vessel ischemic white matter disease.   Electronically Signed   By: Tiburcio Pea M.D.   On: 02/28/2013 23:16   Dg Chest Portable 1 View  02/28/2013   CLINICAL DATA:  Altered mental status.  EXAM: PORTABLE CHEST - 1 VIEW  COMPARISON:  None.  FINDINGS: The heart size and mediastinal contours are within normal limits. Both lungs are clear. The visualized skeletal structures are unremarkable.  IMPRESSION: No active disease.   Electronically Signed   By: Tiburcio Pea M.D.   On: 02/28/2013 23:03    Review of Systems  Unable to perform ROS: mental status change   Blood pressure 189/86, pulse 75, temperature 99.4 F (37.4 C), temperature source Oral, resp. rate 18, height 5\' 8"  (1.727 m), weight 81.647 kg (180  lb), SpO2 99.00%. Physical Exam  Constitutional: He appears well-developed and well-nourished.  HENT:  Head: Normocephalic and atraumatic.  Eyes: Conjunctivae and EOM are normal. Pupils are equal, round, and reactive to light.  Neck: Normal range of motion.  1+ meningismus  Cardiovascular: Normal rate and regular rhythm.   Respiratory: Effort normal and breath sounds normal.  GI: Soft. Bowel sounds are normal.  Neurological:  Arouses to loud voice . Will follow simple commands. Pupils 3mm and reactive face symmetric motor function symmmetric in upper and lower extremities.  Skin: Skin is warm and dry.    Assessment/Plan: Right Parietal ICH. Fever. I do not believe these two are related and specifically the scan is not at all consistent with an abscess. An Mri will bear this out if one has not been performed at [facility]. 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 I will follow with you.   Lincoln Ginley J 03/02/2013, 8:20 PM

## 2013-03-03 ENCOUNTER — Inpatient Hospital Stay (HOSPITAL_COMMUNITY): Payer: Medicare Other

## 2013-03-03 DIAGNOSIS — E876 Hypokalemia: Secondary | ICD-10-CM

## 2013-03-03 DIAGNOSIS — M7981 Nontraumatic hematoma of soft tissue: Secondary | ICD-10-CM

## 2013-03-03 DIAGNOSIS — E785 Hyperlipidemia, unspecified: Secondary | ICD-10-CM

## 2013-03-03 LAB — BASIC METABOLIC PANEL
Calcium: 8.1 mg/dL — ABNORMAL LOW (ref 8.4–10.5)
Creatinine, Ser: 0.79 mg/dL (ref 0.50–1.35)
GFR calc non Af Amer: 86 mL/min — ABNORMAL LOW (ref >90)
Glucose, Bld: 109 mg/dL — ABNORMAL HIGH (ref 70–99)
Sodium: 129 mEq/L — ABNORMAL LOW (ref 135–145)

## 2013-03-03 LAB — SEDIMENTATION RATE: Sed Rate: 27 mm/hr — ABNORMAL HIGH (ref 0–16)

## 2013-03-03 LAB — CBC
HCT: 37.1 % — ABNORMAL LOW (ref 39.0–52.0)
Hemoglobin: 13.2 g/dL (ref 13.0–17.0)
MCH: 32.2 pg (ref 26.0–34.0)
MCHC: 35.6 g/dL (ref 30.0–36.0)
Platelets: 186 10*3/uL (ref 150–400)
RBC: 4.1 MIL/uL — ABNORMAL LOW (ref 4.22–5.81)

## 2013-03-03 LAB — HEPATITIS PANEL, ACUTE
HCV Ab: NEGATIVE
Hep B C IgM: NONREACTIVE
Hepatitis B Surface Ag: NEGATIVE

## 2013-03-03 MED ORDER — GADOBENATE DIMEGLUMINE 529 MG/ML IV SOLN
20.0000 mL | Freq: Once | INTRAVENOUS | Status: AC
  Administered 2013-03-05: 20 mL via INTRAVENOUS

## 2013-03-03 MED ORDER — LORAZEPAM 2 MG/ML IJ SOLN
2.0000 mg | Freq: Once | INTRAMUSCULAR | Status: AC
  Administered 2013-03-03: 2 mg via INTRAVENOUS
  Filled 2013-03-03: qty 1

## 2013-03-03 MED ORDER — POTASSIUM CHLORIDE CRYS ER 20 MEQ PO TBCR
40.0000 meq | EXTENDED_RELEASE_TABLET | Freq: Once | ORAL | Status: AC
  Administered 2013-03-03: 40 meq via ORAL
  Filled 2013-03-03: qty 2

## 2013-03-03 NOTE — Progress Notes (Signed)
Patient ID: Walter Larsen, male   DOB: 1935-11-11, 77 y.o.   MRN: [id number] Patient having confusion but level of consciousness is good.  For Mri hopefully today.  Discussed with hospitalist.  F/U mri.

## 2013-03-03 NOTE — Progress Notes (Signed)
INFECTIOUS DISEASE PROGRESS NOTE  ID: Walter Larsen is a 77 y.o. male with  Principal Problem:   Fever, unspecified Active Problems:   Hemorrhagic stroke   Essential hypertension, benign   Other and unspecified hyperlipidemia   Bradycardia   Hypokalemia  Subjective: C/o headache, nausea  Abtx:  Anti-infectives   Start     Dose/Rate Route Frequency Ordered Stop   03/03/13 0300  cefTRIAXone (ROCEPHIN) 2 g in dextrose 5 % 50 mL IVPB     2 g 100 mL/hr over 30 Minutes Intravenous Every 12 hours 03/02/13 1453     03/02/13 1300  cefTRIAXone (ROCEPHIN) 2 g in dextrose 5 % 50 mL IVPB  Status:  Discontinued     2 g 100 mL/hr over 30 Minutes Intravenous Every 24 hours 03/02/13 1241 03/02/13 1453   03/02/13 1300  metroNIDAZOLE (FLAGYL) IVPB 500 mg     500 mg 100 mL/hr over 60 Minutes Intravenous Every 6 hours 03/02/13 1241     03/02/13 1300  vancomycin (VANCOCIN) IVPB 1000 mg/200 mL premix     1,000 mg 200 mL/hr over 60 Minutes Intravenous Every 12 hours 03/02/13 1252     03/01/13 1400  cefTRIAXone (ROCEPHIN) 2 g in dextrose 5 % 50 mL IVPB  Status:  Discontinued     2 g 100 mL/hr over 30 Minutes Intravenous Every 24 hours 03/01/13 1245 03/02/13 0952   03/01/13 1200  vancomycin (VANCOCIN) IVPB 1000 mg/200 mL premix  Status:  Discontinued     1,000 mg 200 mL/hr over 60 Minutes Intravenous Every 12 hours 03/01/13 0353 03/02/13 0952   03/01/13 0600  piperacillin-tazobactam (ZOSYN) IVPB 3.375 g  Status:  Discontinued     3.375 g 12.5 mL/hr over 240 Minutes Intravenous 3 times per day 03/01/13 0353 03/01/13 1245   02/28/13 2300  piperacillin-tazobactam (ZOSYN) IVPB 3.375 g     3.375 g 100 mL/hr over 30 Minutes Intravenous  Once 02/28/13 2249 03/01/13 0007   02/28/13 2300  vancomycin (VANCOCIN) IVPB 1000 mg/200 mL premix     1,000 mg 200 mL/hr over 60 Minutes Intravenous  Once 02/28/13 2249 03/01/13 0104      Medications:  Scheduled: . atorvastatin  20 mg Oral QHS  . cefTRIAXone  (ROCEPHIN)  IV  2 g Intravenous Q12H  . feeding supplement (ENSURE COMPLETE)  237 mL Oral TID  . LORazepam  2 mg Intravenous Once  . metronidazole  500 mg Intravenous Q6H  . pantoprazole  40 mg Oral Daily  . vancomycin  1,000 mg Intravenous Q12H    Objective: Vital signs in last 24 hours: Temp:  [98.3 F (36.8 C)-100.4 F (38 C)] 98.3 F (36.8 C) (11/20 1055) Pulse Rate:  [62-78] 62 (11/20 1055) Resp:  [16-19] 16 (11/20 1055) BP: (164-189)/(71-89) 170/89 mmHg (11/20 1055) SpO2:  [95 %-99 %] 95 % (11/20 1055)   General appearance: alert, cooperative and no distress Resp: clear to auscultation bilaterally Cardio: regular rate and rhythm GI: normal findings: bowel sounds normal and soft, non-tender  Lab Results  Recent Labs  03/02/13 0414 03/03/13 0545  WBC 11.2* 11.2*  HGB 13.2 13.2  HCT 37.8* 37.1*  NA 134* 129*  K 3.3* 3.4*  CL 101 97  CO2 21 19  BUN 14 10  CREATININE 0.82 0.79   Liver Panel  Recent Labs  02/28/13 2250 03/01/13 0615  PROT 7.3 6.8  ALBUMIN 3.4* 3.0*  AST 37 31  ALT 32 28  ALKPHOS 41 39  BILITOT 1.3* 1.1  Sedimentation Rate  Recent Labs  03/03/13 0545  ESRSEDRATE 27*   C-Reactive Protein  Recent Labs  03/03/13 0545  CRP 2.5*    Microbiology: Recent Results (from the past 240 hour(s))  CULTURE, BLOOD (ROUTINE X 2)     Status: None   Collection Time    02/28/13 11:35 PM      Result Value Range Status   Specimen Description BLOOD LEFT WRIST   Final   Special Requests BOTTLES DRAWN AEROBIC AND ANAEROBIC 10CC EACH   Final   Culture  Setup Time     Final   Value: 03/01/2013 04:45     Performed at [facility]   Culture     Final   Value:        BLOOD CULTURE RECEIVED NO GROWTH TO DATE CULTURE WILL BE HELD FOR 5 DAYS BEFORE ISSUING A FINAL NEGATIVE REPORT     Performed at [facility]   Report Status PENDING   Incomplete  CULTURE, BLOOD (ROUTINE X 2)     Status: None   Collection Time    02/28/13 11:40 PM        Result Value Range Status   Specimen Description BLOOD LEFT HAND   Final   Special Requests BOTTLES DRAWN AEROBIC ONLY 5CC   Final   Culture  Setup Time     Final   Value: 03/01/2013 04:44     Performed at [facility]   Culture     Final   Value:        BLOOD CULTURE RECEIVED NO GROWTH TO DATE CULTURE WILL BE HELD FOR 5 DAYS BEFORE ISSUING A FINAL NEGATIVE REPORT     Performed at [facility]   Report Status PENDING   Incomplete  URINE CULTURE     Status: None   Collection Time    03/01/13 12:16 AM      Result Value Range Status   Specimen Description URINE, CLEAN CATCH   Final   Special Requests NONE   Final   Culture  Setup Time     Final   Value: 03/01/2013 01:00     Performed at [facility] Count     Final   Value: NO GROWTH     Performed at [facility]   Culture     Final   Value: NO GROWTH     Performed at [facility]   Report Status 03/02/2013 FINAL   Final    Studies/Results: Dg Chest 2 View  03/01/2013   CLINICAL DATA:  Fever.  EXAM: CHEST - 2 VIEW  COMPARISON:  02/28/2013  FINDINGS: The heart size and mediastinal contours are within normal limits. There is no evidence of pulmonary edema, consolidation, pneumothorax, nodule or pleural fluid. Stable degenerative changes are seen in the thoracic spine  IMPRESSION: No active disease.   Electronically Signed   By: Irish Lack M.D.   On: 03/01/2013 16:35     Assessment/Plan: Fever MS change Recent CNS bleed, hematoma  Await MRI to r/o abscess, infection of hematoma No change in anbx BCx and UCx are ngtd.  Total days of antibiotics: 4 vanco/flagyl/ceftriaxone         [name] Infectious Diseases (pager) [phone] [email or web address] 03/03/2013, 12:23 PM  LOS: 3 days

## 2013-03-03 NOTE — Progress Notes (Signed)
[facility]                                                                                Patient Demographics  Walter Larsen, is a 77 y.o. male, DOB - 1936-02-01, YTK:[id number]  Admit date - 02/28/2013   Admitting Physician Lynden Oxford, MD  Outpatient Primary MD for the patient is Provider Not In System  LOS - 3   Chief Complaint  Patient presents with  . Altered Mental Status        Assessment & Plan   Principal Problem:   Fever, unspecified Active Problems:   Hemorrhagic stroke   Essential hypertension, benign   Other and unspecified hyperlipidemia   Bradycardia   Hypokalemia  Fever -Unknown etiology, may be secondary to Recent Temporal/occiptal hematoma -Urine analysis negative -Urine and blood cultures shows no growth at this time.  -Continue empiric antibiotic coverage with vancomycin and ceftrixone. Flagyl was added 03/02/2013, -ID and neurosurgery consulted and following -Will order repeat MRI to evaluate for abscess  Hypokalemia -K 3.4, will replace and recheck BMP in the morning  Right temporal/occipital parenchymal hematoma -Neurosurgery and neurology consulted and following.   -CT Scan: Right temporal/occipital parenchymal hematoma, measuring up to 7 cm in length. Trace intraventricular extension; no hydrocephalus. -MRI ordered.  Encephalopathy/ AMS -Resolved, no longer altered, at baseline -Neurology following  -EEG: This is a normal EEG recording during sleep. No evidence of an epileptic disorder was seen.  Bradycardia -Asymptomatic. Hold Metoprolol held  HTN - Metoprolol held.  Continue hydralazine PRN.  Hyperlipidemia -Continue atorvastatin   Hyponatremia -Na 129, will continue IVF.  May be secondary to insensible losses.  Code Status: Full  Family Communication: Son at bedside  Disposition Plan: Admitted.    Procedures -EEG: This is a normal EEG recording during sleep. No evidence of an epileptic disorder was  seen.  Consults   Neurology Neurosurgery Infectious Disease  DVT Prophylaxis  SCDs  Lab Results  Component Value Date   PLT 186 03/03/2013    Medications  Scheduled Meds: . atorvastatin  20 mg Oral QHS  . cefTRIAXone (ROCEPHIN)  IV  2 g Intravenous Q12H  . feeding supplement (ENSURE COMPLETE)  237 mL Oral TID  . metronidazole  500 mg Intravenous Q6H  . pantoprazole  40 mg Oral Daily  . vancomycin  1,000 mg Intravenous Q12H   Continuous Infusions: . sodium chloride 100 mL/hr at 03/03/13 0342   PRN Meds:.acetaminophen, hydrALAZINE, ondansetron (ZOFRAN) IV, traMADol  Antibiotics    Anti-infectives   Start     Dose/Rate Route Frequency Ordered Stop   03/03/13 0300  cefTRIAXone (ROCEPHIN) 2 g in dextrose 5 % 50 mL IVPB     2 g 100 mL/hr over 30 Minutes Intravenous Every 12 hours 03/02/13 1453     03/02/13 1300  cefTRIAXone (ROCEPHIN) 2 g in dextrose 5 % 50 mL IVPB  Status:  Discontinued     2 g 100 mL/hr over 30 Minutes Intravenous Every 24 hours 03/02/13 1241 03/02/13 1453   03/02/13 1300  metroNIDAZOLE (FLAGYL) IVPB 500 mg     500 mg 100 mL/hr over 60 Minutes Intravenous Every 6 hours 03/02/13 1241  03/02/13 1300  vancomycin (VANCOCIN) IVPB 1000 mg/200 mL premix     1,000 mg 200 mL/hr over 60 Minutes Intravenous Every 12 hours 03/02/13 1252     03/01/13 1400  cefTRIAXone (ROCEPHIN) 2 g in dextrose 5 % 50 mL IVPB  Status:  Discontinued     2 g 100 mL/hr over 30 Minutes Intravenous Every 24 hours 03/01/13 1245 03/02/13 0952   03/01/13 1200  vancomycin (VANCOCIN) IVPB 1000 mg/200 mL premix  Status:  Discontinued     1,000 mg 200 mL/hr over 60 Minutes Intravenous Every 12 hours 03/01/13 0353 03/02/13 0952   03/01/13 0600  piperacillin-tazobactam (ZOSYN) IVPB 3.375 g  Status:  Discontinued     3.375 g 12.5 mL/hr over 240 Minutes Intravenous 3 times per day 03/01/13 0353 03/01/13 1245   02/28/13 2300  piperacillin-tazobactam (ZOSYN) IVPB 3.375 g     3.375 g 100 mL/hr  over 30 Minutes Intravenous  Once 02/28/13 2249 03/01/13 0007   02/28/13 2300  vancomycin (VANCOCIN) IVPB 1000 mg/200 mL premix     1,000 mg 200 mL/hr over 60 Minutes Intravenous  Once 02/28/13 2249 03/01/13 0104       Time Spent in minutes   30 minutes   Lechelle Wrigley D.O. on 03/03/2013 at 8:44 AM  Between 7am to 7pm - Pager - [phone]  After 7pm go to [email or web address] - password [email or web address]  And look for the night coverage person covering for me after hours  [facility] Office  [phone]    Subjective:   Walter Larsen seen and examined today. Patient continues to complain of headache and some neck pain.  He also felt nauseous this morning, however, no longer feels this way.  Denies vomiting.  He continues to feel "hot and is sweating". Patient denies dizziness, chest pain, shortness of breath, abdominal pain, /D/C, new weakness, numbess, tingling.    Objective:   Filed Vitals:   03/02/13 1835 03/02/13 2123 03/02/13 2356 03/03/13 0202  BP: 189/86 180/87 167/82 164/77  Pulse: 75 78 70 77  Temp: 99.4 F (37.4 C) 100.4 F (38 C) 99.5 F (37.5 C) 100 F (37.8 C)  TempSrc: Oral Oral Oral Oral  Resp: 18 18 19 18   Height:      Weight:      SpO2: 99% 97% 96% 95%    Wt Readings from Last 3 Encounters:  03/01/13 81.647 kg (180 lb)     Intake/Output Summary (Last 24 hours) at 03/03/13 0844 Last data filed at 03/02/13 2233  Gross per 24 hour  Intake 3753.33 ml  Output      0 ml  Net 3753.33 ml    Exam  General: Well developed, well nourished, NAD, appears stated age  HEENT: NCAT, PERRLA, EOMI, Anicteic Sclera, mucous membranes moist.   Neck: Supple, no JVD, no masses  Cardiovascular: S1 S2 auscultated, no rubs, murmurs or gallops. Regular rate and rhythm.  Respiratory: Clear to auscultation bilaterally with equal chest rise  Abdomen: Soft, nontender, nondistended, + bowel sounds  Extremities: warm dry without cyanosis clubbing or edema  Neuro:  AAOx3, cranial nerves grossly intact. Strength 5/5 in patient's upper and lower extremities bilaterally  Skin: Without rashes exudates or nodules  Psych: Normal affect and demeanor with intact judgement and insight  Data Review   Micro Results Recent Results (from the past 240 hour(s))  CULTURE, BLOOD (ROUTINE X 2)     Status: None   Collection Time    02/28/13 11:35 PM  Result Value Range Status   Specimen Description BLOOD LEFT WRIST   Final   Special Requests BOTTLES DRAWN AEROBIC AND ANAEROBIC 10CC EACH   Final   Culture  Setup Time     Final   Value: 03/01/2013 04:45     Performed at [facility]   Culture     Final   Value:        BLOOD CULTURE RECEIVED NO GROWTH TO DATE CULTURE WILL BE HELD FOR 5 DAYS BEFORE ISSUING A FINAL NEGATIVE REPORT     Performed at [facility]   Report Status PENDING   Incomplete  CULTURE, BLOOD (ROUTINE X 2)     Status: None   Collection Time    02/28/13 11:40 PM      Result Value Range Status   Specimen Description BLOOD LEFT HAND   Final   Special Requests BOTTLES DRAWN AEROBIC ONLY 5CC   Final   Culture  Setup Time     Final   Value: 03/01/2013 04:44     Performed at [facility]   Culture     Final   Value:        BLOOD CULTURE RECEIVED NO GROWTH TO DATE CULTURE WILL BE HELD FOR 5 DAYS BEFORE ISSUING A FINAL NEGATIVE REPORT     Performed at [facility]   Report Status PENDING   Incomplete  URINE CULTURE     Status: None   Collection Time    03/01/13 12:16 AM      Result Value Range Status   Specimen Description URINE, CLEAN CATCH   Final   Special Requests NONE   Final   Culture  Setup Time     Final   Value: 03/01/2013 01:00     Performed at [facility] Count     Final   Value: NO GROWTH     Performed at [facility]   Culture     Final   Value: NO GROWTH     Performed at [facility]   Report Status 03/02/2013 FINAL   Final    Radiology Reports Dg Chest  2 View  03/01/2013   CLINICAL DATA:  Fever.  EXAM: CHEST - 2 VIEW  COMPARISON:  02/28/2013  FINDINGS: The heart size and mediastinal contours are within normal limits. There is no evidence of pulmonary edema, consolidation, pneumothorax, nodule or pleural fluid. Stable degenerative changes are seen in the thoracic spine  IMPRESSION: No active disease.   Electronically Signed   By: Irish Lack M.D.   On: 03/01/2013 16:35   Ct Head Wo Contrast  02/28/2013   CLINICAL DATA:  Altered mental status.  Stroke.  EXAM: CT HEAD WITHOUT CONTRAST  TECHNIQUE: Contiguous axial images were obtained from the base of the skull through the vertex without intravenous contrast.  COMPARISON:  None.  FINDINGS: Skull and Sinuses:No significant abnormality.  Orbits: Bilateral cataract resection.  Brain: Acute hemorrhage in the posterior right cerebral hemisphere, located in the posterior temporal and occipital regions. The hematoma is irregularly shaped, but the entire area of abnormality measures 7cm in length, 3 cm in width. There is a moderate amount of surrounding cerebral edema. Minimal intraventricular extension into the right lateral ventricle. No herniation, midline shift, or hydrocephalus.  Chronic small vessel ischemic white matter disease with patchy bilateral cerebral white matter low density. Extensive intracranial atherosclerotic calcification.  Critical Value/emergent results were called by telephone at the time of  interpretation on 02/28/2013 at 11:13 PM to Dr.JOSHUA ZAVITZ , who verbally acknowledged these results.  IMPRESSION: 1. Right temporal/occipital parenchymal hematoma, measuring up to 7 cm in length. Trace intraventricular extension; no hydrocephalus. 2. Chronic small vessel ischemic white matter disease.   Electronically Signed   By: Tiburcio Pea M.D.   On: 02/28/2013 23:16   Dg Chest Portable 1 View  02/28/2013   CLINICAL DATA:  Altered mental status.  EXAM: PORTABLE CHEST - 1 VIEW  COMPARISON:   None.  FINDINGS: The heart size and mediastinal contours are within normal limits. Both lungs are clear. The visualized skeletal structures are unremarkable.  IMPRESSION: No active disease.   Electronically Signed   By: Tiburcio Pea M.D.   On: 02/28/2013 23:03    CBC  Recent Labs Lab 02/28/13 2245 03/01/13 0615 03/02/13 0414 03/03/13 0545  WBC 15.5* 12.0* 11.2* 11.2*  HGB 14.8 14.0 13.2 13.2  HCT 41.2 40.0 37.8* 37.1*  PLT 220 181 182 186  MCV 91.2 91.1 92.6 90.5  MCH 32.7 31.9 32.4 32.2  MCHC 35.9 35.0 34.9 35.6  RDW 12.9 13.0 13.3 13.3  LYMPHSABS 1.4 1.8  --   --   MONOABS 2.4* 2.2*  --   --   EOSABS 0.0 0.0  --   --   BASOSABS 0.0 0.0  --   --     Chemistries   Recent Labs Lab 02/28/13 2250 03/01/13 0615 03/02/13 0414 03/03/13 0545  NA 131* 137 134* 129*  K 3.3* 3.6 3.3* 3.4*  CL 97 104 101 97  CO2 22 20 21 19   GLUCOSE 138* 111* 128* 109*  BUN 17 15 14 10   CREATININE 0.85 0.98 0.82 0.79  CALCIUM 9.4 8.7 8.5 8.1*  AST 37 31  --   --   ALT 32 28  --   --   ALKPHOS 41 39  --   --   BILITOT 1.3* 1.1  --   --    ------------------------------------------------------------------------------------------------------------------ estimated creatinine clearance is 78.4 ml/min (by C-G formula based on Cr of 0.79). ------------------------------------------------------------------------------------------------------------------ No results found for this basename: HGBA1C,  in the last 72 hours ------------------------------------------------------------------------------------------------------------------ No results found for this basename: CHOL, HDL, LDLCALC, TRIG, CHOLHDL, LDLDIRECT,  in the last 72 hours ------------------------------------------------------------------------------------------------------------------  Recent Labs  03/01/13 0900  TSH 7.064*    ------------------------------------------------------------------------------------------------------------------ No results found for this basename: VITAMINB12, FOLATE, FERRITIN, TIBC, IRON, RETICCTPCT,  in the last 72 hours  Coagulation profile No results found for this basename: INR, PROTIME,  in the last 168 hours  No results found for this basename: DDIMER,  in the last 72 hours  Cardiac Enzymes No results found for this basename: CK, CKMB, TROPONINI, MYOGLOBIN,  in the last 168 hours ------------------------------------------------------------------------------------------------------------------ No components found with this basename: POCBNP,

## 2013-03-04 LAB — CBC
MCHC: 34.8 g/dL (ref 30.0–36.0)
Platelets: 196 10*3/uL (ref 150–400)
RDW: 13.3 % (ref 11.5–15.5)

## 2013-03-04 LAB — BASIC METABOLIC PANEL
GFR calc Af Amer: >90 mL/min (ref >90)
GFR calc non Af Amer: 87 mL/min — ABNORMAL LOW (ref >90)
Potassium: 3.6 mEq/L (ref 3.5–5.1)
Sodium: 131 mEq/L — ABNORMAL LOW (ref 135–145)

## 2013-03-04 LAB — HIV-1 RNA QUANT-NO REFLEX-BLD: HIV 1 RNA Quant: <20 copies/mL (ref <20)

## 2013-03-04 NOTE — Progress Notes (Signed)
ANTIBIOTIC CONSULT NOTE - Follow Up  Pharmacy Consult for Vancomycin Indication: r/o brain abscess  No Known Allergies  Patient Measurements: Height: 5\' 8"  (172.7 cm) Weight: 180 lb (81.647 kg) IBW/kg (Calculated) : 68.4  Vital Signs: Temp: 99.7 F (37.6 C) (11/21 0900) Temp src: Oral (11/21 0900) BP: 176/77 mmHg (11/21 0900) Pulse Rate: 72 (11/21 0900) Labs:  Recent Labs  03/02/13 0414 03/03/13 0545 03/04/13 0345  WBC 11.2* 11.2* 14.2*  HGB 13.2 13.2 14.0  PLT 182 186 196  CREATININE 0.82 0.79 0.78   CrCl: 73.31mL/min  Assessment: 77 y/o M here with AMS/fever, DC'd from [facility] recently with hemorrhagic stroke (evident on CT 11/17).  Antibiotics started for fever r/o brain abscess  Goal of Therapy:  Vancomycin trough level 15-20 mcg/ml  Plan:  -Check vanc trough tonight. Thank you for allowing me to take part in this patient's care,  Talbert Cage, PharmD Clinical Pharmacist Phone: [phone] 03/04/2013 11:17 AM

## 2013-03-04 NOTE — Progress Notes (Signed)
[facility]                                                                                Patient Demographics  Walter Larsen, is a 77 y.o. male, DOB - 1935-09-04, OZH:[id number]  Admit date - 02/28/2013   Admitting Physician Lynden Oxford, MD  Outpatient Primary MD for the patient is Provider Not In System  LOS - 4   Chief Complaint  Patient presents with  . Altered Mental Status        Assessment & Plan   Principal Problem:   Fever, unspecified Active Problems:   Hemorrhagic stroke   Essential hypertension, benign   Other and unspecified hyperlipidemia   Bradycardia   Hypokalemia  Fever -Unknown etiology, may be secondary to Recent Temporal/occiptal hematoma -Urine analysis negative -Urine and blood cultures shows no growth at this time.  -Continue empiric antibiotic coverage with vancomycin and ceftrixone. Flagyl was added 03/02/2013, -ID and neurosurgery consulted and following -Pending repeat MRI to evaluate for abscess  Hypokalemia -K 3.6, will continue to monitor  Right temporal/occipital parenchymal hematoma -Neurosurgery and neurology consulted and following.   -CT Scan: Right temporal/occipital parenchymal hematoma, measuring up to 7 cm in length. Trace intraventricular extension; no hydrocephalus. -MRI ordered.  Encephalopathy/ AMS -Resolved, no longer altered, at baseline -Neurology following  -EEG: This is a normal EEG recording during sleep. No evidence of an epileptic disorder was seen.  Bradycardia -Asymptomatic. Hold Metoprolol held  HTN - Metoprolol held.  Continue hydralazine PRN.  Hyperlipidemia -Continue atorvastatin   Hyponatremia -Improving, Na 131, will continue IVF.  May be secondary to insensible losses.  Code Status: Full  Family Communication: Wife at bedside  Disposition Plan: Admitted.    Procedures -EEG: This is a normal EEG recording during sleep. No evidence of an epileptic disorder was seen.  Consults    Neurology Neurosurgery Infectious Disease  DVT Prophylaxis  SCDs  Lab Results  Component Value Date   PLT 196 03/04/2013    Medications  Scheduled Meds: . atorvastatin  20 mg Oral QHS  . cefTRIAXone (ROCEPHIN)  IV  2 g Intravenous Q12H  . feeding supplement (ENSURE COMPLETE)  237 mL Oral TID  . gadobenate dimeglumine  20 mL Intravenous Once  . metronidazole  500 mg Intravenous Q6H  . pantoprazole  40 mg Oral Daily  . vancomycin  1,000 mg Intravenous Q12H   Continuous Infusions: . sodium chloride 100 mL/hr at 03/04/13 0351   PRN Meds:.acetaminophen, hydrALAZINE, ondansetron (ZOFRAN) IV, traMADol  Antibiotics    Anti-infectives   Start     Dose/Rate Route Frequency Ordered Stop   03/03/13 0300  cefTRIAXone (ROCEPHIN) 2 g in dextrose 5 % 50 mL IVPB     2 g 100 mL/hr over 30 Minutes Intravenous Every 12 hours 03/02/13 1453     03/02/13 1300  cefTRIAXone (ROCEPHIN) 2 g in dextrose 5 % 50 mL IVPB  Status:  Discontinued     2 g 100 mL/hr over 30 Minutes Intravenous Every 24 hours 03/02/13 1241 03/02/13 1453   03/02/13 1300  metroNIDAZOLE (FLAGYL) IVPB 500 mg     500 mg 100 mL/hr over 60 Minutes Intravenous Every 6 hours  03/02/13 1241     03/02/13 1300  vancomycin (VANCOCIN) IVPB 1000 mg/200 mL premix     1,000 mg 200 mL/hr over 60 Minutes Intravenous Every 12 hours 03/02/13 1252     03/01/13 1400  cefTRIAXone (ROCEPHIN) 2 g in dextrose 5 % 50 mL IVPB  Status:  Discontinued     2 g 100 mL/hr over 30 Minutes Intravenous Every 24 hours 03/01/13 1245 03/02/13 0952   03/01/13 1200  vancomycin (VANCOCIN) IVPB 1000 mg/200 mL premix  Status:  Discontinued     1,000 mg 200 mL/hr over 60 Minutes Intravenous Every 12 hours 03/01/13 0353 03/02/13 0952   03/01/13 0600  piperacillin-tazobactam (ZOSYN) IVPB 3.375 g  Status:  Discontinued     3.375 g 12.5 mL/hr over 240 Minutes Intravenous 3 times per day 03/01/13 0353 03/01/13 1245   02/28/13 2300  piperacillin-tazobactam (ZOSYN) IVPB  3.375 g     3.375 g 100 mL/hr over 30 Minutes Intravenous  Once 02/28/13 2249 03/01/13 0007   02/28/13 2300  vancomycin (VANCOCIN) IVPB 1000 mg/200 mL premix     1,000 mg 200 mL/hr over 60 Minutes Intravenous  Once 02/28/13 2249 03/01/13 0104       Time Spent in minutes   20 minutes   Randal Goens D.O. on 03/04/2013 at 9:47 AM  Between 7am to 7pm - Pager - [phone]  After 7pm go to [email or web address] - password [email or web address]  And look for the night coverage person covering for me after hours  [facility] Office  [phone]    Subjective:   Merrit Friesen seen and examined today. Patient continues to complain of headache and some neck pain, however states they are improving.  He continues to feel warm and "hot".  Patient denies dizziness, chest pain, shortness of breath, abdominal pain, N/V/D/C, new weakness, numbess, tingling.    Objective:   Filed Vitals:   03/03/13 1905 03/03/13 2119 03/04/13 0152 03/04/13 0601  BP: 179/80 159/71 172/81 170/80  Pulse: 70 86 86 72  Temp: 99 F (37.2 C) 99.4 F (37.4 C) 98.2 F (36.8 C) 98 F (36.7 C)  TempSrc: Oral Oral Oral Oral  Resp: 20 18 18 20   Height:      Weight:      SpO2: 98% 96% 97% 96%    Wt Readings from Last 3 Encounters:  03/01/13 81.647 kg (180 lb)     Intake/Output Summary (Last 24 hours) at 03/04/13 0947 Last data filed at 03/04/13 0600  Gross per 24 hour  Intake 1158.33 ml  Output   1325 ml  Net -166.67 ml    Exam  General: Well developed, well nourished, NAD, appears stated age  HEENT: NCAT, Anicteic Sclera, mucous membranes moist.   Neck: Supple, no JVD, no masses  Cardiovascular: S1 S2 auscultated, no rubs, murmurs or gallops. Regular rate and rhythm.  Respiratory: Clear to auscultation bilaterally with equal chest rise  Abdomen: Soft, nontender, nondistended, + bowel sounds  Extremities: warm dry without cyanosis clubbing or edema  Neuro: AAOx3, no neurological deficits  Skin:  Without rashes exudates or nodules  Psych: Normal affect and demeanor with intact judgement and insight  Data Review   Micro Results Recent Results (from the past 240 hour(s))  CULTURE, BLOOD (ROUTINE X 2)     Status: None   Collection Time    02/28/13 11:35 PM      Result Value Range Status   Specimen Description BLOOD LEFT WRIST   Final   Special Requests  BOTTLES DRAWN AEROBIC AND ANAEROBIC 10CC EACH   Final   Culture  Setup Time     Final   Value: 03/01/2013 04:45     Performed at [facility]   Culture     Final   Value:        BLOOD CULTURE RECEIVED NO GROWTH TO DATE CULTURE WILL BE HELD FOR 5 DAYS BEFORE ISSUING A FINAL NEGATIVE REPORT     Performed at [facility]   Report Status PENDING   Incomplete  CULTURE, BLOOD (ROUTINE X 2)     Status: None   Collection Time    02/28/13 11:40 PM      Result Value Range Status   Specimen Description BLOOD LEFT HAND   Final   Special Requests BOTTLES DRAWN AEROBIC ONLY 5CC   Final   Culture  Setup Time     Final   Value: 03/01/2013 04:44     Performed at [facility]   Culture     Final   Value:        BLOOD CULTURE RECEIVED NO GROWTH TO DATE CULTURE WILL BE HELD FOR 5 DAYS BEFORE ISSUING A FINAL NEGATIVE REPORT     Performed at [facility]   Report Status PENDING   Incomplete  URINE CULTURE     Status: None   Collection Time    03/01/13 12:16 AM      Result Value Range Status   Specimen Description URINE, CLEAN CATCH   Final   Special Requests NONE   Final   Culture  Setup Time     Final   Value: 03/01/2013 01:00     Performed at [facility] Count     Final   Value: NO GROWTH     Performed at [facility]   Culture     Final   Value: NO GROWTH     Performed at [facility]   Report Status 03/02/2013 FINAL   Final    Radiology Reports Dg Chest 2 View  03/01/2013   CLINICAL DATA:  Fever.  EXAM: CHEST - 2 VIEW  COMPARISON:  02/28/2013  FINDINGS: The  heart size and mediastinal contours are within normal limits. There is no evidence of pulmonary edema, consolidation, pneumothorax, nodule or pleural fluid. Stable degenerative changes are seen in the thoracic spine  IMPRESSION: No active disease.   Electronically Signed   By: Irish Lack M.D.   On: 03/01/2013 16:35   Ct Head Wo Contrast  02/28/2013   CLINICAL DATA:  Altered mental status.  Stroke.  EXAM: CT HEAD WITHOUT CONTRAST  TECHNIQUE: Contiguous axial images were obtained from the base of the skull through the vertex without intravenous contrast.  COMPARISON:  None.  FINDINGS: Skull and Sinuses:No significant abnormality.  Orbits: Bilateral cataract resection.  Brain: Acute hemorrhage in the posterior right cerebral hemisphere, located in the posterior temporal and occipital regions. The hematoma is irregularly shaped, but the entire area of abnormality measures 7cm in length, 3 cm in width. There is a moderate amount of surrounding cerebral edema. Minimal intraventricular extension into the right lateral ventricle. No herniation, midline shift, or hydrocephalus.  Chronic small vessel ischemic white matter disease with patchy bilateral cerebral white matter low density. Extensive intracranial atherosclerotic calcification.  Critical Value/emergent results were called by telephone at the time of interpretation on 02/28/2013 at 11:13 PM to Dr.JOSHUA ZAVITZ , who verbally acknowledged these results.  IMPRESSION: 1.  Right temporal/occipital parenchymal hematoma, measuring up to 7 cm in length. Trace intraventricular extension; no hydrocephalus. 2. Chronic small vessel ischemic white matter disease.   Electronically Signed   By: Tiburcio Pea M.D.   On: 02/28/2013 23:16   Dg Chest Portable 1 View  02/28/2013   CLINICAL DATA:  Altered mental status.  EXAM: PORTABLE CHEST - 1 VIEW  COMPARISON:  None.  FINDINGS: The heart size and mediastinal contours are within normal limits. Both lungs are clear. The  visualized skeletal structures are unremarkable.  IMPRESSION: No active disease.   Electronically Signed   By: Tiburcio Pea M.D.   On: 02/28/2013 23:03    CBC  Recent Labs Lab 02/28/13 2245 03/01/13 0615 03/02/13 0414 03/03/13 0545 03/04/13 0345  WBC 15.5* 12.0* 11.2* 11.2* 14.2*  HGB 14.8 14.0 13.2 13.2 14.0  HCT 41.2 40.0 37.8* 37.1* 40.2  PLT 220 181 182 186 196  MCV 91.2 91.1 92.6 90.5 91.8  MCH 32.7 31.9 32.4 32.2 32.0  MCHC 35.9 35.0 34.9 35.6 34.8  RDW 12.9 13.0 13.3 13.3 13.3  LYMPHSABS 1.4 1.8  --   --   --   MONOABS 2.4* 2.2*  --   --   --   EOSABS 0.0 0.0  --   --   --   BASOSABS 0.0 0.0  --   --   --     Chemistries   Recent Labs Lab 02/28/13 2250 03/01/13 0615 03/02/13 0414 03/03/13 0545 03/04/13 0345  NA 131* 137 134* 129* 131*  K 3.3* 3.6 3.3* 3.4* 3.6  CL 97 104 101 97 97  CO2 22 20 21 19 23   GLUCOSE 138* 111* 128* 109* 123*  BUN 17 15 14 10 10   CREATININE 0.85 0.98 0.82 0.79 0.78  CALCIUM 9.4 8.7 8.5 8.1* 8.6  AST 37 31  --   --   --   ALT 32 28  --   --   --   ALKPHOS 41 39  --   --   --   BILITOT 1.3* 1.1  --   --   --    ------------------------------------------------------------------------------------------------------------------ estimated creatinine clearance is 78.4 ml/min (by C-G formula based on Cr of 0.78). ------------------------------------------------------------------------------------------------------------------ No results found for this basename: HGBA1C,  in the last 72 hours ------------------------------------------------------------------------------------------------------------------ No results found for this basename: CHOL, HDL, LDLCALC, TRIG, CHOLHDL, LDLDIRECT,  in the last 72 hours ------------------------------------------------------------------------------------------------------------------ No results found for this basename: TSH, T4TOTAL, FREET3, T3FREE, THYROIDAB,  in the last 72  hours ------------------------------------------------------------------------------------------------------------------ No results found for this basename: VITAMINB12, FOLATE, FERRITIN, TIBC, IRON, RETICCTPCT,  in the last 72 hours  Coagulation profile No results found for this basename: INR, PROTIME,  in the last 168 hours  No results found for this basename: DDIMER,  in the last 72 hours  Cardiac Enzymes No results found for this basename: CK, CKMB, TROPONINI, MYOGLOBIN,  in the last 168 hours ------------------------------------------------------------------------------------------------------------------ No components found with this basename: POCBNP,

## 2013-03-05 ENCOUNTER — Inpatient Hospital Stay (HOSPITAL_COMMUNITY): Payer: Medicare Other

## 2013-03-05 LAB — CBC
Hemoglobin: 13.2 g/dL (ref 13.0–17.0)
MCH: 31.9 pg (ref 26.0–34.0)
Platelets: 213 10*3/uL (ref 150–400)
RBC: 4.14 MIL/uL — ABNORMAL LOW (ref 4.22–5.81)
WBC: 13.7 10*3/uL — ABNORMAL HIGH (ref 4.0–10.5)

## 2013-03-05 LAB — VANCOMYCIN, TROUGH: Vancomycin Tr: 5.6 ug/mL — ABNORMAL LOW (ref 10.0–20.0)

## 2013-03-05 MED ORDER — CYCLOBENZAPRINE HCL 10 MG PO TABS
5.0000 mg | ORAL_TABLET | Freq: Three times a day (TID) | ORAL | Status: DC | PRN
  Administered 2013-03-05 – 2013-03-12 (×3): 5 mg via ORAL
  Filled 2013-03-05 (×3): qty 1

## 2013-03-05 MED ORDER — LORAZEPAM 2 MG/ML IJ SOLN: 1.0000 mg | Freq: Once | INTRAMUSCULAR | Status: DC

## 2013-03-05 MED ORDER — VANCOMYCIN HCL 10 G IV SOLR
1250.0000 mg | Freq: Three times a day (TID) | INTRAVENOUS | Status: DC
  Administered 2013-03-05 – 2013-03-07 (×8): 1250 mg via INTRAVENOUS
  Filled 2013-03-05 (×10): qty 1250

## 2013-03-05 MED ORDER — LORAZEPAM 1 MG PO TABS: 1.5000 mg | ORAL_TABLET | ORAL | Status: DC

## 2013-03-05 MED ORDER — METOPROLOL TARTRATE 50 MG PO TABS
50.0000 mg | ORAL_TABLET | Freq: Two times a day (BID) | ORAL | Status: DC
  Administered 2013-03-05 – 2013-03-15 (×20): 50 mg via ORAL
  Filled 2013-03-05 (×24): qty 1

## 2013-03-05 MED ORDER — GADOBENATE DIMEGLUMINE 529 MG/ML IV SOLN: 20.0000 mL | Freq: Once | INTRAVENOUS | Status: AC | PRN

## 2013-03-05 MED ORDER — LORAZEPAM 2 MG/ML IJ SOLN
2.0000 mg | Freq: Once | INTRAMUSCULAR | Status: AC
  Administered 2013-03-05: 2 mg via INTRAVENOUS
  Filled 2013-03-05: qty 1

## 2013-03-05 NOTE — Progress Notes (Signed)
[facility]                                                                                Patient Demographics  Walter Larsen, is a 77 y.o. male, DOB - [birth year], FAO:[id number]  Admit date - 02/28/2013   Admitting Physician Lynden Oxford, MD  Outpatient Primary MD for the patient is Provider Not In System  LOS - 5   Chief Complaint  Patient presents with  . Altered Mental Status        Assessment & Plan   Principal Problem:   Fever, unspecified Active Problems:   Hemorrhagic stroke   Essential hypertension, benign   Other and unspecified hyperlipidemia   Bradycardia   Hypokalemia  Fever -Unknown etiology, may be secondary to Recent Temporal/occiptal hematoma -Urine analysis negative -Urine and blood cultures shows no growth at this time.  -Continue empiric antibiotic coverage with vancomycin (day 6) and ceftrixone (day 5), flagyl (day 4) -ID and neurosurgery consulted and following -Pending repeat MRI to evaluate for abscess (attempted, however patient did not tolerate, will attempt again)  Hypokalemia -K 3.6, will continue to monitor  Right temporal/occipital parenchymal hematoma -Neurosurgery and neurology consulted and following.   -CT Scan: Right temporal/occipital parenchymal hematoma, measuring up to 7 cm in length. Trace intraventricular extension; no hydrocephalus. -MRI ordered.  Encephalopathy/ AMS -Resolved, no longer altered, at baseline -Neurology following  -EEG: This is a normal EEG recording during sleep. No evidence of an epileptic disorder was seen.  Bradycardia -Resovled  HTN -Will restart metoprolol 50mg  BID. Continue hydralazine PRN.  Hyperlipidemia -Continue atorvastatin   Hyponatremia -Improving, Na 131, will continue IVF.  May be secondary to insensible losses.  Code Status: Full  Family Communication: Wife at bedside  Disposition Plan: Admitted.    Procedures -EEG: This is a normal EEG recording during sleep. No  evidence of an epileptic disorder was seen.  Consults   Neurology Neurosurgery Infectious Disease  DVT Prophylaxis  SCDs  Lab Results  Component Value Date   PLT 213 03/05/2013    Medications  Scheduled Meds: . atorvastatin  20 mg Oral QHS  . cefTRIAXone (ROCEPHIN)  IV  2 g Intravenous Q12H  . feeding supplement (ENSURE COMPLETE)  237 mL Oral TID  . gadobenate dimeglumine  20 mL Intravenous Once  . LORazepam  1.5 mg Oral UD  . metoprolol tartrate  50 mg Oral BID  . metronidazole  500 mg Intravenous Q6H  . pantoprazole  40 mg Oral Daily  . vancomycin  1,250 mg Intravenous Q8H   Continuous Infusions: . sodium chloride 100 mL/hr at 03/05/13 0923   PRN Meds:.acetaminophen, cyclobenzaprine, hydrALAZINE, ondansetron (ZOFRAN) IV, traMADol  Antibiotics    Anti-infectives   Start     Dose/Rate Route Frequency Ordered Stop   03/05/13 0800  vancomycin (VANCOCIN) 1,250 mg in sodium chloride 0.9 % 250 mL IVPB     1,250 mg 166.7 mL/hr over 90 Minutes Intravenous Every 8 hours 03/05/13 0225     03/03/13 0300  cefTRIAXone (ROCEPHIN) 2 g in dextrose 5 % 50 mL IVPB     2 g 100 mL/hr over 30 Minutes Intravenous Every 12 hours 03/02/13 1453  03/02/13 1300  cefTRIAXone (ROCEPHIN) 2 g in dextrose 5 % 50 mL IVPB  Status:  Discontinued     2 g 100 mL/hr over 30 Minutes Intravenous Every 24 hours 03/02/13 1241 03/02/13 1453   03/02/13 1300  metroNIDAZOLE (FLAGYL) IVPB 500 mg     500 mg 100 mL/hr over 60 Minutes Intravenous Every 6 hours 03/02/13 1241     03/02/13 1300  vancomycin (VANCOCIN) IVPB 1000 mg/200 mL premix  Status:  Discontinued     1,000 mg 200 mL/hr over 60 Minutes Intravenous Every 12 hours 03/02/13 1252 03/05/13 0224   03/01/13 1400  cefTRIAXone (ROCEPHIN) 2 g in dextrose 5 % 50 mL IVPB  Status:  Discontinued     2 g 100 mL/hr over 30 Minutes Intravenous Every 24 hours 03/01/13 1245 03/02/13 0952   03/01/13 1200  vancomycin (VANCOCIN) IVPB 1000 mg/200 mL premix  Status:   Discontinued     1,000 mg 200 mL/hr over 60 Minutes Intravenous Every 12 hours 03/01/13 0353 03/02/13 0952   03/01/13 0600  piperacillin-tazobactam (ZOSYN) IVPB 3.375 g  Status:  Discontinued     3.375 g 12.5 mL/hr over 240 Minutes Intravenous 3 times per day 03/01/13 0353 03/01/13 1245   02/28/13 2300  piperacillin-tazobactam (ZOSYN) IVPB 3.375 g     3.375 g 100 mL/hr over 30 Minutes Intravenous  Once 02/28/13 2249 03/01/13 0007   02/28/13 2300  vancomycin (VANCOCIN) IVPB 1000 mg/200 mL premix     1,000 mg 200 mL/hr over 60 Minutes Intravenous  Once 02/28/13 2249 03/01/13 0104       Time Spent in minutes   20 minutes   Camarie Mctigue D.O. on 03/05/2013 at 11:16 AM  Between 7am to 7pm - Pager - [phone]  After 7pm go to [email or web address] - password [email or web address]  And look for the night coverage person covering for me after hours  [facility] Office  [phone]    Subjective:   Walter Larsen seen and examined today. Patient continues to complain of headache and some neck pain and fevers.  He continues to feel hot and weak. Patient denies dizziness, chest pain, shortness of breath, abdominal pain, N/V/D/C, new weakness, numbess, tingling.    Objective:   Filed Vitals:   03/04/13 2135 03/05/13 0217 03/05/13 0700 03/05/13 0926  BP: 157/72 141/76 167/74 166/69  Pulse: 83 78 78 76  Temp: 98.1 F (36.7 C) 99.3 F (37.4 C) 99.3 F (37.4 C) 99.1 F (37.3 C)  TempSrc: Oral Oral Oral Oral  Resp: 18 18 20 20   Height:      Weight:      SpO2: 97% 97% 96% 96%    Wt Readings from Last 3 Encounters:  03/01/13 81.647 kg (180 lb)     Intake/Output Summary (Last 24 hours) at 03/05/13 1116 Last data filed at 03/05/13 0700  Gross per 24 hour  Intake    720 ml  Output   1700 ml  Net   -980 ml    Exam  General: Well developed, well nourished, NAD, appears stated age  HEENT: NCAT, Anicteic Sclera, mucous membranes moist.   Neck: Supple, no JVD, no  masses  Cardiovascular: S1 S2 auscultated, no rubs, murmurs or gallops. Regular rate and rhythm.  Respiratory: Clear to auscultation bilaterally with equal chest rise  Abdomen: Soft, nontender, nondistended, + bowel sounds  Extremities: warm dry without cyanosis clubbing or edema  Neuro: AAOx3, no neurological deficits  Skin: Without rashes exudates or nodules  Psych: Normal  affect and demeanor with intact judgement and insight  Data Review   Micro Results Recent Results (from the past 240 hour(s))  CULTURE, BLOOD (ROUTINE X 2)     Status: None   Collection Time    02/28/13 11:35 PM      Result Value Range Status   Specimen Description BLOOD LEFT WRIST   Final   Special Requests BOTTLES DRAWN AEROBIC AND ANAEROBIC 10CC EACH   Final   Culture  Setup Time     Final   Value: 03/01/2013 04:45     Performed at [facility]   Culture     Final   Value:        BLOOD CULTURE RECEIVED NO GROWTH TO DATE CULTURE WILL BE HELD FOR 5 DAYS BEFORE ISSUING A FINAL NEGATIVE REPORT     Performed at [facility]   Report Status PENDING   Incomplete  CULTURE, BLOOD (ROUTINE X 2)     Status: None   Collection Time    02/28/13 11:40 PM      Result Value Range Status   Specimen Description BLOOD LEFT HAND   Final   Special Requests BOTTLES DRAWN AEROBIC ONLY 5CC   Final   Culture  Setup Time     Final   Value: 03/01/2013 04:44     Performed at [facility]   Culture     Final   Value:        BLOOD CULTURE RECEIVED NO GROWTH TO DATE CULTURE WILL BE HELD FOR 5 DAYS BEFORE ISSUING A FINAL NEGATIVE REPORT     Performed at [facility]   Report Status PENDING   Incomplete  URINE CULTURE     Status: None   Collection Time    03/01/13 12:16 AM      Result Value Range Status   Specimen Description URINE, CLEAN CATCH   Final   Special Requests NONE   Final   Culture  Setup Time     Final   Value: 03/01/2013 01:00     Performed at [facility]  Count     Final   Value: NO GROWTH     Performed at [facility]   Culture     Final   Value: NO GROWTH     Performed at [facility]   Report Status 03/02/2013 FINAL   Final    Radiology Reports Dg Chest 2 View  03/01/2013   CLINICAL DATA:  Fever.  EXAM: CHEST - 2 VIEW  COMPARISON:  02/28/2013  FINDINGS: The heart size and mediastinal contours are within normal limits. There is no evidence of pulmonary edema, consolidation, pneumothorax, nodule or pleural fluid. Stable degenerative changes are seen in the thoracic spine  IMPRESSION: No active disease.   Electronically Signed   By: Irish Lack M.D.   On: 03/01/2013 16:35   Ct Head Wo Contrast  02/28/2013   CLINICAL DATA:  Altered mental status.  Stroke.  EXAM: CT HEAD WITHOUT CONTRAST  TECHNIQUE: Contiguous axial images were obtained from the base of the skull through the vertex without intravenous contrast.  COMPARISON:  None.  FINDINGS: Skull and Sinuses:No significant abnormality.  Orbits: Bilateral cataract resection.  Brain: Acute hemorrhage in the posterior right cerebral hemisphere, located in the posterior temporal and occipital regions. The hematoma is irregularly shaped, but the entire area of abnormality measures 7cm in length, 3 cm in width. There is a moderate amount of  surrounding cerebral edema. Minimal intraventricular extension into the right lateral ventricle. No herniation, midline shift, or hydrocephalus.  Chronic small vessel ischemic white matter disease with patchy bilateral cerebral white matter low density. Extensive intracranial atherosclerotic calcification.  Critical Value/emergent results were called by telephone at the time of interpretation on 02/28/2013 at 11:13 PM to Dr.JOSHUA ZAVITZ , who verbally acknowledged these results.  IMPRESSION: 1. Right temporal/occipital parenchymal hematoma, measuring up to 7 cm in length. Trace intraventricular extension; no hydrocephalus. 2. Chronic small vessel  ischemic white matter disease.   Electronically Signed   By: Tiburcio Pea M.D.   On: 02/28/2013 23:16   Dg Chest Portable 1 View  02/28/2013   CLINICAL DATA:  Altered mental status.  EXAM: PORTABLE CHEST - 1 VIEW  COMPARISON:  None.  FINDINGS: The heart size and mediastinal contours are within normal limits. Both lungs are clear. The visualized skeletal structures are unremarkable.  IMPRESSION: No active disease.   Electronically Signed   By: Tiburcio Pea M.D.   On: 02/28/2013 23:03    CBC  Recent Labs Lab 02/28/13 2245 03/01/13 0615 03/02/13 0414 03/03/13 0545 03/04/13 0345 03/05/13 0945  WBC 15.5* 12.0* 11.2* 11.2* 14.2* 13.7*  HGB 14.8 14.0 13.2 13.2 14.0 13.2  HCT 41.2 40.0 37.8* 37.1* 40.2 37.9*  PLT 220 181 182 186 196 213  MCV 91.2 91.1 92.6 90.5 91.8 91.5  MCH 32.7 31.9 32.4 32.2 32.0 31.9  MCHC 35.9 35.0 34.9 35.6 34.8 34.8  RDW 12.9 13.0 13.3 13.3 13.3 13.5  LYMPHSABS 1.4 1.8  --   --   --   --   MONOABS 2.4* 2.2*  --   --   --   --   EOSABS 0.0 0.0  --   --   --   --   BASOSABS 0.0 0.0  --   --   --   --     Chemistries   Recent Labs Lab 02/28/13 2250 03/01/13 0615 03/02/13 0414 03/03/13 0545 03/04/13 0345  NA 131* 137 134* 129* 131*  K 3.3* 3.6 3.3* 3.4* 3.6  CL 97 104 101 97 97  CO2 22 20 21 19 23   GLUCOSE 138* 111* 128* 109* 123*  BUN 17 15 14 10 10   CREATININE 0.85 0.98 0.82 0.79 0.78  CALCIUM 9.4 8.7 8.5 8.1* 8.6  AST 37 31  --   --   --   ALT 32 28  --   --   --   ALKPHOS 41 39  --   --   --   BILITOT 1.3* 1.1  --   --   --    ------------------------------------------------------------------------------------------------------------------ estimated creatinine clearance is 78.4 ml/min (by C-G formula based on Cr of 0.78). ------------------------------------------------------------------------------------------------------------------ No results found for this basename: HGBA1C,  in the last 72  hours ------------------------------------------------------------------------------------------------------------------ No results found for this basename: CHOL, HDL, LDLCALC, TRIG, CHOLHDL, LDLDIRECT,  in the last 72 hours ------------------------------------------------------------------------------------------------------------------ No results found for this basename: TSH, T4TOTAL, FREET3, T3FREE, THYROIDAB,  in the last 72 hours ------------------------------------------------------------------------------------------------------------------ No results found for this basename: VITAMINB12, FOLATE, FERRITIN, TIBC, IRON, RETICCTPCT,  in the last 72 hours  Coagulation profile No results found for this basename: INR, PROTIME,  in the last 168 hours  No results found for this basename: DDIMER,  in the last 72 hours  Cardiac Enzymes No results found for this basename: CK, CKMB, TROPONINI, MYOGLOBIN,  in the last 168 hours ------------------------------------------------------------------------------------------------------------------ No components found with this basename: POCBNP,

## 2013-03-05 NOTE — Progress Notes (Signed)
[facility]  Day # 5 ceftriaxone Day # 4 flagyl Day # 6 vancomcyin  Subjective: No new complaints didn't like the MRI scanner   Antibiotics:  Anti-infectives   Start     Dose/Rate Route Frequency Ordered Stop   03/05/13 0800  vancomycin (VANCOCIN) 1,250 mg in sodium chloride 0.9 % 250 mL IVPB     1,250 mg 166.7 mL/hr over 90 Minutes Intravenous Every 8 hours 03/05/13 0225     03/03/13 0300  cefTRIAXone (ROCEPHIN) 2 g in dextrose 5 % 50 mL IVPB     2 g 100 mL/hr over 30 Minutes Intravenous Every 12 hours 03/02/13 1453     03/02/13 1300  cefTRIAXone (ROCEPHIN) 2 g in dextrose 5 % 50 mL IVPB  Status:  Discontinued     2 g 100 mL/hr over 30 Minutes Intravenous Every 24 hours 03/02/13 1241 03/02/13 1453   03/02/13 1300  metroNIDAZOLE (FLAGYL) IVPB 500 mg     500 mg 100 mL/hr over 60 Minutes Intravenous Every 6 hours 03/02/13 1241     03/02/13 1300  vancomycin (VANCOCIN) IVPB 1000 mg/200 mL premix  Status:  Discontinued     1,000 mg 200 mL/hr over 60 Minutes Intravenous Every 12 hours 03/02/13 1252 03/05/13 0224   03/01/13 1400  cefTRIAXone (ROCEPHIN) 2 g in dextrose 5 % 50 mL IVPB  Status:  Discontinued     2 g 100 mL/hr over 30 Minutes Intravenous Every 24 hours 03/01/13 1245 03/02/13 0952   03/01/13 1200  vancomycin (VANCOCIN) IVPB 1000 mg/200 mL premix  Status:  Discontinued     1,000 mg 200 mL/hr over 60 Minutes Intravenous Every 12 hours 03/01/13 0353 03/02/13 0952   03/01/13 0600  piperacillin-tazobactam (ZOSYN) IVPB 3.375 g  Status:  Discontinued     3.375 g 12.5 mL/hr over 240 Minutes Intravenous 3 times per day 03/01/13 0353 03/01/13 1245   02/28/13 2300  piperacillin-tazobactam (ZOSYN) IVPB 3.375 g     3.375 g 100 mL/hr over 30 Minutes Intravenous  Once 02/28/13 2249 03/01/13 0007   02/28/13 2300  vancomycin (VANCOCIN) IVPB 1000 mg/200 mL premix     1,000 mg 200 mL/hr over 60 Minutes Intravenous  Once 02/28/13 2249 03/01/13 0104       Medications: Scheduled Meds: . atorvastatin  20 mg Oral QHS  . cefTRIAXone (ROCEPHIN)  IV  2 g Intravenous Q12H  . feeding supplement (ENSURE COMPLETE)  237 mL Oral TID  . gadobenate dimeglumine  20 mL Intravenous Once  . LORazepam  1.5 mg Oral UD  . metronidazole  500 mg Intravenous Q6H  . pantoprazole  40 mg Oral Daily  . vancomycin  1,250 mg Intravenous Q8H   Continuous Infusions: . sodium chloride 100 mL/hr at 03/05/13 0923   PRN Meds:.acetaminophen, cyclobenzaprine, hydrALAZINE, ondansetron (ZOFRAN) IV, traMADol   Objective: Weight change:   Intake/Output Summary (Last 24 hours) at 03/05/13 0942 Last data filed at 03/05/13 0700  Gross per 24 hour  Intake    720 ml  Output   1700 ml  Net   -980 ml   Blood pressure 166/69, pulse 76, temperature 99.1 F (37.3 C), temperature source Oral, resp. rate 20, height 5\' 8"  (1.727 m), weight 180 lb (81.647 kg), SpO2 96.00%. Temp:  [97.6 F (36.4 C)-99.3 F (37.4 C)] 99.1 F (37.3 C) (11/22 0926) Pulse Rate:  [76-89] 76 (11/22 0926) Resp:  [18-20] 20 (11/22 0926) BP: (141-171)/(69-82) 166/69 mmHg (11/22 0926) SpO2:  [96 %-  98 %] 96 % (11/22 0926)  Physical Exam: General: very sleepy but arousable and  oriented x3, not in any acute distress. HEENT: anicteric sclera, , EOMI CVS regular rate, normal r,  no murmur rubs or gallops Chest: clear to auscultation bilaterally, no wheezing, rales or rhonchi Abdomen: soft nontender, nondistended, normal bowel sounds, Extremities: no  clubbing or edema noted bilaterally Skin: no rashes Lymph: no new lymphadenopathy Neuro: nonfocal   Lab Results:  Recent Labs  03/03/13 0545 03/04/13 0345  WBC 11.2* 14.2*  HGB 13.2 14.0  HCT 37.1* 40.2  PLT 186 196    BMET  Recent Labs  03/03/13 0545 03/04/13 0345  NA 129* 131*  K 3.4* 3.6  CL 97 97  CO2 19 23  GLUCOSE 109* 123*  BUN 10 10  CREATININE 0.79 0.78  CALCIUM 8.1* 8.6    Micro Results: Recent Results (from the  past 240 hour(s))  CULTURE, BLOOD (ROUTINE X 2)     Status: None   Collection Time    02/28/13 11:35 PM      Result Value Range Status   Specimen Description BLOOD LEFT WRIST   Final   Special Requests BOTTLES DRAWN AEROBIC AND ANAEROBIC 10CC EACH   Final   Culture  Setup Time     Final   Value: 03/01/2013 04:45     Performed at [facility]   Culture     Final   Value:        BLOOD CULTURE RECEIVED NO GROWTH TO DATE CULTURE WILL BE HELD FOR 5 DAYS BEFORE ISSUING A FINAL NEGATIVE REPORT     Performed at [facility]   Report Status PENDING   Incomplete  CULTURE, BLOOD (ROUTINE X 2)     Status: None   Collection Time    02/28/13 11:40 PM      Result Value Range Status   Specimen Description BLOOD LEFT HAND   Final   Special Requests BOTTLES DRAWN AEROBIC ONLY 5CC   Final   Culture  Setup Time     Final   Value: 03/01/2013 04:44     Performed at [facility]   Culture     Final   Value:        BLOOD CULTURE RECEIVED NO GROWTH TO DATE CULTURE WILL BE HELD FOR 5 DAYS BEFORE ISSUING A FINAL NEGATIVE REPORT     Performed at [facility]   Report Status PENDING   Incomplete  URINE CULTURE     Status: None   Collection Time    03/01/13 12:16 AM      Result Value Range Status   Specimen Description URINE, CLEAN CATCH   Final   Special Requests NONE   Final   Culture  Setup Time     Final   Value: 03/01/2013 01:00     Performed at [facility] Count     Final   Value: NO GROWTH     Performed at [facility]   Culture     Final   Value: NO GROWTH     Performed at [facility]   Report Status 03/02/2013 FINAL   Final    Studies/Results: No results found.    Assessment/Plan: Garrick Midgley is a 77 y.o. male with  year-old man with recent [facility] with Right temporal/occipital parenchymal hematoma dc and then admitted with fevers, headaches and confusion    #1 Fevers:  My concern has  been given his pain with neck flexion could be that he in fact has a BRAIN abscess or superinfected hematoma with extension into the meninges though again the MRI did not suggest abscess at [facility].   Dr Danielle Dess from Neurosurgery and he does NOT feel that MRI is c/w with abscess at all  MRI was attempted on [facility] but he did not tolerate secondary to claustrophobia  He has already nearly received 5 days of fairly broad spectrum abx  --I will re-attempt MRI with ativan 1.5mg  up to twice po prior to procedure and he may require more sedation in scanner   --if MRI is NOT c/wo brain abscess would be inclined to stop all of his abx and would ascribe fevers to the bleed itself   LOS: 5 days   Acey Lav 03/05/2013, 9:42 AM

## 2013-03-05 NOTE — Progress Notes (Signed)
ANTIBIOTIC CONSULT NOTE - FOLLOW UP  Pharmacy Consult for vancomycin Indication: r/o brain abscess  Labs:  Recent Labs  03/02/13 0414 03/03/13 0545 03/04/13 0345  WBC 11.2* 11.2* 14.2*  HGB 13.2 13.2 14.0  PLT 182 186 196  CREATININE 0.82 0.79 0.78   Estimated Creatinine Clearance: 78.4 ml/min (by C-G formula based on Cr of 0.78).  Recent Labs  03/04/13 0025  VANCOTROUGH 5.6*      Assessment: 76yo male subtherapeutic on vancomycin with initial dosing for possible brain abscess, still awaiting MRI.  Goal of Therapy:  Vancomycin trough level 15-20 mcg/ml  Plan:  Will increase vancomycin aggressively to 1250mg  IV Q8H given very low trough and continue to monitor closely.  Vernard Gambles, PharmD, BCPS  03/05/2013,2:25 AM

## 2013-03-06 LAB — PHOSPHORUS: Phosphorus: 2.1 mg/dL — ABNORMAL LOW (ref 2.3–4.6)

## 2013-03-06 LAB — CBC
HCT: 38 % — ABNORMAL LOW (ref 39.0–52.0)
Hemoglobin: 13.3 g/dL (ref 13.0–17.0)
MCHC: 35 g/dL (ref 30.0–36.0)
MCV: 91.3 fL (ref 78.0–100.0)
RDW: 13.4 % (ref 11.5–15.5)
WBC: 16.1 10*3/uL — ABNORMAL HIGH (ref 4.0–10.5)

## 2013-03-06 LAB — BASIC METABOLIC PANEL
BUN: 10 mg/dL (ref 6–23)
Chloride: 97 mEq/L (ref 96–112)
GFR calc Af Amer: >90 mL/min (ref >90)
Potassium: 3.1 mEq/L — ABNORMAL LOW (ref 3.5–5.1)
Sodium: 129 mEq/L — ABNORMAL LOW (ref 135–145)

## 2013-03-06 LAB — MAGNESIUM: Magnesium: 2 mg/dL (ref 1.5–2.5)

## 2013-03-06 MED ORDER — POTASSIUM CHLORIDE CRYS ER 20 MEQ PO TBCR
40.0000 meq | EXTENDED_RELEASE_TABLET | Freq: Once | ORAL | Status: AC
  Administered 2013-03-06: 40 meq via ORAL
  Filled 2013-03-06: qty 2

## 2013-03-06 NOTE — Progress Notes (Signed)
[facility]                                                                                Patient Demographics  Walter Larsen, is a 77 y.o. male, DOB - 02-Jun-1935, WJX:[id number]  Admit date - 02/28/2013   Admitting Physician Walter Oxford, MD  Outpatient Primary MD for the patient is Provider Not In System  LOS - 6   Chief Complaint  Patient presents with  . Altered Mental Status        Assessment & Plan   Principal Problem:   Fever, unspecified Active Problems:   Hemorrhagic stroke   Essential hypertension, benign   Other and unspecified hyperlipidemia   Bradycardia   Hypokalemia  Fever -Unknown etiology, may be secondary to Recent Temporal/occiptal hematoma -Urine analysis negative -Urine and blood cultures shows no growth at this time.  -Continue empiric antibiotic coverage with vancomycin (day7) and ceftrixone (day 6), flagyl (day 5) -ID and neurosurgery consulted and following -MRI suggests emboli -Will speak to cardiology regarding TEE  Hypokalemia -K 3.1, will continue to monitor -Will also obtain magnesium and phosphorus levels.  Right temporal/occipital parenchymal hematoma -Neurosurgery and neurology consulted and following.   -CT Scan: Right temporal/occipital parenchymal hematoma, measuring up to 7 cm in length. Trace intraventricular extension; no hydrocephalus.  Encephalopathy/ AMS -Resolved, no longer altered, at baseline -Neurology following  -EEG: This is a normal EEG recording during sleep. No evidence of an epileptic disorder was seen.  Bradycardia -Resovled  HTN -Will restart metoprolol 50mg  BID. Continue hydralazine PRN.  Hyperlipidemia -Continue atorvastatin   Hyponatremia -Improving, Na 129, will continue IVF.  May be secondary to insensible losses.  Code Status: Full  Family Communication: Wife at bedside  Disposition Plan: Admitted.    Procedures EEG: This is a normal EEG recording during sleep. No evidence of an  epileptic disorder was seen.  MRI IMPRESSION:  Large subacute hematoma right temporoparietal lobe. Superimposed abscess not considered likely.  Small areas of acute infarct in the right occipital parietal lobe and left temporoparietal lobe, question emboli.  Consults   Neurology Neurosurgery Infectious Disease Cardiology  DVT Prophylaxis  SCDs  Lab Results  Component Value Date   PLT 238 03/06/2013    Medications  Scheduled Meds: . atorvastatin  20 mg Oral QHS  . cefTRIAXone (ROCEPHIN)  IV  2 g Intravenous Q12H  . feeding supplement (ENSURE COMPLETE)  237 mL Oral TID  . LORazepam  1 mg Intravenous Once  . metoprolol tartrate  50 mg Oral BID  . metronidazole  500 mg Intravenous Q6H  . pantoprazole  40 mg Oral Daily  . potassium chloride  40 mEq Oral Once  . vancomycin  1,250 mg Intravenous Q8H   Continuous Infusions: . sodium chloride 100 mL/hr at 03/06/13 0548   PRN Meds:.acetaminophen, cyclobenzaprine, hydrALAZINE, ondansetron (ZOFRAN) IV, traMADol  Antibiotics    Anti-infectives   Start     Dose/Rate Route Frequency Ordered Stop   03/05/13 0800  vancomycin (VANCOCIN) 1,250 mg in sodium chloride 0.9 % 250 mL IVPB     1,250 mg 166.7 mL/hr over 90 Minutes Intravenous Every 8 hours 03/05/13 0225     03/03/13 0300  cefTRIAXone (ROCEPHIN) 2 g in dextrose 5 % 50 mL IVPB     2 g 100 mL/hr over 30 Minutes Intravenous Every 12 hours 03/02/13 1453     03/02/13 1300  cefTRIAXone (ROCEPHIN) 2 g in dextrose 5 % 50 mL IVPB  Status:  Discontinued     2 g 100 mL/hr over 30 Minutes Intravenous Every 24 hours 03/02/13 1241 03/02/13 1453   03/02/13 1300  metroNIDAZOLE (FLAGYL) IVPB 500 mg     500 mg 100 mL/hr over 60 Minutes Intravenous Every 6 hours 03/02/13 1241     03/02/13 1300  vancomycin (VANCOCIN) IVPB 1000 mg/200 mL premix  Status:  Discontinued     1,000 mg 200 mL/hr over 60 Minutes Intravenous Every 12 hours 03/02/13 1252 03/05/13 0224   03/01/13 1400  cefTRIAXone  (ROCEPHIN) 2 g in dextrose 5 % 50 mL IVPB  Status:  Discontinued     2 g 100 mL/hr over 30 Minutes Intravenous Every 24 hours 03/01/13 1245 03/02/13 0952   03/01/13 1200  vancomycin (VANCOCIN) IVPB 1000 mg/200 mL premix  Status:  Discontinued     1,000 mg 200 mL/hr over 60 Minutes Intravenous Every 12 hours 03/01/13 0353 03/02/13 0952   03/01/13 0600  piperacillin-tazobactam (ZOSYN) IVPB 3.375 g  Status:  Discontinued     3.375 g 12.5 mL/hr over 240 Minutes Intravenous 3 times per day 03/01/13 0353 03/01/13 1245   02/28/13 2300  piperacillin-tazobactam (ZOSYN) IVPB 3.375 g     3.375 g 100 mL/hr over 30 Minutes Intravenous  Once 02/28/13 2249 03/01/13 0007   02/28/13 2300  vancomycin (VANCOCIN) IVPB 1000 mg/200 mL premix     1,000 mg 200 mL/hr over 60 Minutes Intravenous  Once 02/28/13 2249 03/01/13 0104       Time Spent in minutes   20 minutes   Walter Larsen D.O. on 03/06/2013 at 12:14 PM  Between 7am to 7pm - Pager - [phone]  After 7pm go to [email or web address] - password [email or web address]  And look for the night coverage person covering for me after hours  [facility] Office  [phone]    Subjective:   Walter Larsen seen and examined today. Patient continues to complain of headache and some neck pain and fevers.  He continues to feel hot and weak. Patient denies dizziness, chest pain, shortness of breath, abdominal pain, N/V/D/C, new weakness, numbess, tingling.    Objective:   Filed Vitals:   03/06/13 0133 03/06/13 0300 03/06/13 0607 03/06/13 0904  BP: 185/59 145/60 178/76 169/81  Pulse: 98  83 76  Temp: 98.5 F (36.9 C)  100.6 F (38.1 C) 99.2 F (37.3 C)  TempSrc: Oral  Oral Oral  Resp: 18  18 18   Height:      Weight:      SpO2: 98%   97%    Wt Readings from Last 3 Encounters:  03/01/13 81.647 kg (180 lb)     Intake/Output Summary (Last 24 hours) at 03/06/13 1214 Last data filed at 03/06/13 8413  Gross per 24 hour  Intake 3261.67 ml  Output    1125 ml  Net 2136.67 ml    Exam  General: Well developed, well nourished, NAD, appears stated age  HEENT: NCAT, Anicteic Sclera, mucous membranes moist.   Neck: Supple, no JVD, no masses  Cardiovascular: S1 S2 auscultated, no rubs, murmurs or gallops. Regular rate and rhythm.  Respiratory: Clear to auscultation bilaterally with equal chest rise  Abdomen: Soft, nontender, nondistended, + bowel sounds  Extremities: warm dry without cyanosis clubbing or edema  Neuro: AAOx3, no neurological deficits  Skin: Without rashes exudates or nodules  Psych: Normal affect and demeanor with intact judgement and insight  Data Review   Micro Results Recent Results (from the past 240 hour(s))  CULTURE, BLOOD (ROUTINE X 2)     Status: None   Collection Time    02/28/13 11:35 PM      Result Value Range Status   Specimen Description BLOOD LEFT WRIST   Final   Special Requests BOTTLES DRAWN AEROBIC AND ANAEROBIC 10CC EACH   Final   Culture  Setup Time     Final   Value: 03/01/2013 04:45     Performed at [facility]   Culture     Final   Value:        BLOOD CULTURE RECEIVED NO GROWTH TO DATE CULTURE WILL BE HELD FOR 5 DAYS BEFORE ISSUING A FINAL NEGATIVE REPORT     Performed at [facility]   Report Status PENDING   Incomplete  CULTURE, BLOOD (ROUTINE X 2)     Status: None   Collection Time    02/28/13 11:40 PM      Result Value Range Status   Specimen Description BLOOD LEFT HAND   Final   Special Requests BOTTLES DRAWN AEROBIC ONLY 5CC   Final   Culture  Setup Time     Final   Value: 03/01/2013 04:44     Performed at [facility]   Culture     Final   Value:        BLOOD CULTURE RECEIVED NO GROWTH TO DATE CULTURE WILL BE HELD FOR 5 DAYS BEFORE ISSUING A FINAL NEGATIVE REPORT     Performed at [facility]   Report Status PENDING   Incomplete  URINE CULTURE     Status: None   Collection Time    03/01/13 12:16 AM      Result Value Range Status    Specimen Description URINE, CLEAN CATCH   Final   Special Requests NONE   Final   Culture  Setup Time     Final   Value: 03/01/2013 01:00     Performed at [facility] Count     Final   Value: NO GROWTH     Performed at [facility]   Culture     Final   Value: NO GROWTH     Performed at [facility]   Report Status 03/02/2013 FINAL   Final    Radiology Reports Dg Chest 2 View  03/01/2013   CLINICAL DATA:  Fever.  EXAM: CHEST - 2 VIEW  COMPARISON:  02/28/2013  FINDINGS: The heart size and mediastinal contours are within normal limits. There is no evidence of pulmonary edema, consolidation, pneumothorax, nodule or pleural fluid. Stable degenerative changes are seen in the thoracic spine  IMPRESSION: No active disease.   Electronically Signed   By: Irish Lack M.D.   On: 03/01/2013 16:35   Ct Head Wo Contrast  02/28/2013   CLINICAL DATA:  Altered mental status.  Stroke.  EXAM: CT HEAD WITHOUT CONTRAST  TECHNIQUE: Contiguous axial images were obtained from the base of the skull through the vertex without intravenous contrast.  COMPARISON:  None.  FINDINGS: Skull and Sinuses:No significant abnormality.  Orbits: Bilateral cataract resection.  Brain: Acute hemorrhage in the posterior right cerebral hemisphere, located in the posterior temporal and occipital regions. The  hematoma is irregularly shaped, but the entire area of abnormality measures 7cm in length, 3 cm in width. There is a moderate amount of surrounding cerebral edema. Minimal intraventricular extension into the right lateral ventricle. No herniation, midline shift, or hydrocephalus.  Chronic small vessel ischemic white matter disease with patchy bilateral cerebral white matter low density. Extensive intracranial atherosclerotic calcification.  Critical Value/emergent results were called by telephone at the time of interpretation on 02/28/2013 at 11:13 PM to Dr.JOSHUA ZAVITZ , who verbally acknowledged  these results.  IMPRESSION: 1. Right temporal/occipital parenchymal hematoma, measuring up to 7 cm in length. Trace intraventricular extension; no hydrocephalus. 2. Chronic small vessel ischemic white matter disease.   Electronically Signed   By: Tiburcio Pea M.D.   On: 02/28/2013 23:16   Dg Chest Portable 1 View  02/28/2013   CLINICAL DATA:  Altered mental status.  EXAM: PORTABLE CHEST - 1 VIEW  COMPARISON:  None.  FINDINGS: The heart size and mediastinal contours are within normal limits. Both lungs are clear. The visualized skeletal structures are unremarkable.  IMPRESSION: No active disease.   Electronically Signed   By: Tiburcio Pea M.D.   On: 02/28/2013 23:03    CBC  Recent Labs Lab 02/28/13 2245 03/01/13 0615 03/02/13 0414 03/03/13 0545 03/04/13 0345 03/05/13 0945 03/06/13 0555  WBC 15.5* 12.0* 11.2* 11.2* 14.2* 13.7* 16.1*  HGB 14.8 14.0 13.2 13.2 14.0 13.2 13.3  HCT 41.2 40.0 37.8* 37.1* 40.2 37.9* 38.0*  PLT 220 181 182 186 196 213 238  MCV 91.2 91.1 92.6 90.5 91.8 91.5 91.3  MCH 32.7 31.9 32.4 32.2 32.0 31.9 32.0  MCHC 35.9 35.0 34.9 35.6 34.8 34.8 35.0  RDW 12.9 13.0 13.3 13.3 13.3 13.5 13.4  LYMPHSABS 1.4 1.8  --   --   --   --   --   MONOABS 2.4* 2.2*  --   --   --   --   --   EOSABS 0.0 0.0  --   --   --   --   --   BASOSABS 0.0 0.0  --   --   --   --   --     Chemistries   Recent Labs Lab 02/28/13 2250 03/01/13 0615 03/02/13 0414 03/03/13 0545 03/04/13 0345 03/06/13 0555  NA 131* 137 134* 129* 131* 129*  K 3.3* 3.6 3.3* 3.4* 3.6 3.1*  CL 97 104 101 97 97 97  CO2 22 20 21 19 23 20   GLUCOSE 138* 111* 128* 109* 123* 100*  BUN 17 15 14 10 10 10   CREATININE 0.85 0.98 0.82 0.79 0.78 0.68  CALCIUM 9.4 8.7 8.5 8.1* 8.6 8.3*  AST 37 31  --   --   --   --   ALT 32 28  --   --   --   --   ALKPHOS 41 39  --   --   --   --   BILITOT 1.3* 1.1  --   --   --   --     ------------------------------------------------------------------------------------------------------------------ estimated creatinine clearance is 78.4 ml/min (by C-G formula based on Cr of 0.68). ------------------------------------------------------------------------------------------------------------------ No results found for this basename: HGBA1C,  in the last 72 hours ------------------------------------------------------------------------------------------------------------------ No results found for this basename: CHOL, HDL, LDLCALC, TRIG, CHOLHDL, LDLDIRECT,  in the last 72 hours ------------------------------------------------------------------------------------------------------------------ No results found for this basename: TSH, T4TOTAL, FREET3, T3FREE, THYROIDAB,  in the last 72 hours ------------------------------------------------------------------------------------------------------------------ No results found for this basename: VITAMINB12, FOLATE,  FERRITIN, TIBC, IRON, RETICCTPCT,  in the last 72 hours  Coagulation profile No results found for this basename: INR, PROTIME,  in the last 168 hours  No results found for this basename: DDIMER,  in the last 72 hours  Cardiac Enzymes No results found for this basename: CK, CKMB, TROPONINI, MYOGLOBIN,  in the last 168 hours ------------------------------------------------------------------------------------------------------------------ No components found with this basename: POCBNP,

## 2013-03-06 NOTE — Progress Notes (Signed)
Walter Larsen has been very agitated, impulsive and confused since he was given ativan pre-MRI.  The family tells [place] that he has had this reaction twice before when given a sedative.  The results did not wear off until the next day per the wife.  He seems much less sedate but far more determined to remove monitor wires and IV's.  The family has remained at the bedside in an effort to prevent injury.  Safety mits were applies with limited success for a brief time.  We will continue to monitor for improvement for the time being.

## 2013-03-07 ENCOUNTER — Encounter (HOSPITAL_COMMUNITY): Payer: Self-pay

## 2013-03-07 ENCOUNTER — Encounter (HOSPITAL_COMMUNITY)
Admission: EM | Disposition: A | Discharge status: Rehabilitation Facility | Payer: Self-pay | Source: Home / Self Care | Attending: Internal Medicine

## 2013-03-07 HISTORY — PX: TEE WITHOUT CARDIOVERSION: SHX5443

## 2013-03-07 LAB — CULTURE, BLOOD (ROUTINE X 2)
Culture: NO GROWTH
Culture: NO GROWTH

## 2013-03-07 LAB — GLUCOSE, CAPILLARY
Glucose-Capillary: 107 mg/dL — ABNORMAL HIGH (ref 70–99)
Glucose-Capillary: 88 mg/dL (ref 70–99)

## 2013-03-07 LAB — CBC
HCT: 34.8 % — ABNORMAL LOW (ref 39.0–52.0)
Hemoglobin: 12.3 g/dL — ABNORMAL LOW (ref 13.0–17.0)
MCHC: 35.3 g/dL (ref 30.0–36.0)
Platelets: 240 10*3/uL (ref 150–400)
RDW: 13.6 % (ref 11.5–15.5)
WBC: 13 10*3/uL — ABNORMAL HIGH (ref 4.0–10.5)

## 2013-03-07 LAB — BASIC METABOLIC PANEL
BUN: 13 mg/dL (ref 6–23)
Calcium: 8.3 mg/dL — ABNORMAL LOW (ref 8.4–10.5)
Chloride: 99 mEq/L (ref 96–112)
Creatinine, Ser: 0.69 mg/dL (ref 0.50–1.35)
GFR calc Af Amer: >90 mL/min (ref >90)
GFR calc non Af Amer: >90 mL/min (ref >90)
Sodium: 131 mEq/L — ABNORMAL LOW (ref 135–145)

## 2013-03-07 SURGERY — ECHOCARDIOGRAM, TRANSESOPHAGEAL
Anesthesia: Moderate Sedation

## 2013-03-07 MED ORDER — LIDOCAINE VISCOUS 2 % MT SOLN
OROMUCOSAL | Status: DC | PRN
  Administered 2013-03-07: 5 mL via OROMUCOSAL

## 2013-03-07 MED ORDER — ENSURE COMPLETE PO LIQD
237.0000 mL | Freq: Two times a day (BID) | ORAL | Status: DC
  Administered 2013-03-08 – 2013-03-15 (×12): 237 mL via ORAL

## 2013-03-07 MED ORDER — SODIUM CHLORIDE 0.9 % IV SOLN: INTRAVENOUS | Status: DC

## 2013-03-07 MED ORDER — DIVALPROEX SODIUM ER 500 MG PO TB24
500.0000 mg | ORAL_TABLET | Freq: Every day | ORAL | Status: DC
  Administered 2013-03-08 – 2013-03-14 (×7): 500 mg via ORAL
  Filled 2013-03-07 (×8): qty 1

## 2013-03-07 MED ORDER — SODIUM CHLORIDE 0.45 % IV SOLN: INTRAVENOUS | Status: DC

## 2013-03-07 MED ORDER — VALPROATE SODIUM 500 MG/5ML IV SOLN
1000.0000 mg | Freq: Once | INTRAVENOUS | Status: AC
  Administered 2013-03-07: 1000 mg via INTRAVENOUS
  Filled 2013-03-07: qty 10

## 2013-03-07 MED ORDER — FENTANYL CITRATE 0.05 MG/ML IJ SOLN
INTRAMUSCULAR | Status: DC | PRN
  Administered 2013-03-07 (×2): 25 ug via INTRAVENOUS

## 2013-03-07 MED ORDER — DIPHENHYDRAMINE HCL 50 MG/ML IJ SOLN
INTRAMUSCULAR | Status: AC
Start: 2013-03-07 — End: 2013-03-07
  Filled 2013-03-07: qty 1

## 2013-03-07 MED ORDER — LIDOCAINE VISCOUS 2 % MT SOLN
OROMUCOSAL | Status: AC
  Filled 2013-03-07: qty 15

## 2013-03-07 MED ORDER — MIDAZOLAM HCL 5 MG/ML IJ SOLN
INTRAMUSCULAR | Status: AC
  Filled 2013-03-07: qty 2

## 2013-03-07 MED ORDER — FENTANYL CITRATE 0.05 MG/ML IJ SOLN
INTRAMUSCULAR | Status: AC
  Filled 2013-03-07: qty 2

## 2013-03-07 MED ORDER — ACETAMINOPHEN 650 MG RE SUPP
650.0000 mg | RECTAL | Status: DC | PRN
  Administered 2013-03-07: 650 mg via RECTAL
  Filled 2013-03-07: qty 1

## 2013-03-07 NOTE — Progress Notes (Addendum)
  [facility]   Day # 7  ceftriaxone Day # 6 flagyl Day # 8 vancomcyin  Subjective: No new complaints    Antibiotics:  Anti-infectives   Start     Dose/Rate Route Frequency Ordered Stop   03/05/13 0800  vancomycin (VANCOCIN) 1,250 mg in sodium chloride 0.9 % 250 mL IVPB     1,250 mg 166.7 mL/hr over 90 Minutes Intravenous Every 8 hours 03/05/13 0225     03/03/13 0300  cefTRIAXone (ROCEPHIN) 2 g in dextrose 5 % 50 mL IVPB     2 g 100 mL/hr over 30 Minutes Intravenous Every 12 hours 03/02/13 1453     03/02/13 1300  cefTRIAXone (ROCEPHIN) 2 g in dextrose 5 % 50 mL IVPB  Status:  Discontinued     2 g 100 mL/hr over 30 Minutes Intravenous Every 24 hours 03/02/13 1241 03/02/13 1453   03/02/13 1300  metroNIDAZOLE (FLAGYL) IVPB 500 mg  Status:  Discontinued     500 mg 100 mL/hr over 60 Minutes Intravenous Every 6 hours 03/02/13 1241 03/07/13 0914   03/02/13 1300  vancomycin (VANCOCIN) IVPB 1000 mg/200 mL premix  Status:  Discontinued     1,000 mg 200 mL/hr over 60 Minutes Intravenous Every 12 hours 03/02/13 1252 03/05/13 0224   03/01/13 1400  cefTRIAXone (ROCEPHIN) 2 g in dextrose 5 % 50 mL IVPB  Status:  Discontinued     2 g 100 mL/hr over 30 Minutes Intravenous Every 24 hours 03/01/13 1245 03/02/13 0952   03/01/13 1200  vancomycin (VANCOCIN) IVPB 1000 mg/200 mL premix  Status:  Discontinued     1,000 mg 200 mL/hr over 60 Minutes Intravenous Every 12 hours 03/01/13 0353 03/02/13 0952   03/01/13 0600  piperacillin-tazobactam (ZOSYN) IVPB 3.375 g  Status:  Discontinued     3.375 g 12.5 mL/hr over 240 Minutes Intravenous 3 times per day 03/01/13 0353 03/01/13 1245   02/28/13 2300  piperacillin-tazobactam (ZOSYN) IVPB 3.375 g     3.375 g 100 mL/hr over 30 Minutes Intravenous  Once 02/28/13 2249 03/01/13 0007   02/28/13 2300  vancomycin (VANCOCIN) IVPB 1000 mg/200 mL premix     1,000 mg 200 mL/hr over 60 Minutes Intravenous  Once 02/28/13 2249 03/01/13 0104       Medications: Scheduled Meds: . atorvastatin  20 mg Oral QHS  . cefTRIAXone (ROCEPHIN)  IV  2 g Intravenous Q12H  . feeding supplement (ENSURE COMPLETE)  237 mL Oral TID  . LORazepam  1 mg Intravenous Once  . metoprolol tartrate  50 mg Oral BID  . pantoprazole  40 mg Oral Daily  . vancomycin  1,250 mg Intravenous Q8H   Continuous Infusions: . sodium chloride 100 mL/hr at 03/06/13 2341   PRN Meds:.acetaminophen, cyclobenzaprine, hydrALAZINE, ondansetron (ZOFRAN) IV, traMADol   Objective: Weight change:   Intake/Output Summary (Last 24 hours) at 03/07/13 1131 Last data filed at 03/07/13 1125  Gross per 24 hour  Intake    687 ml  Output   3800 ml  Net  -3113 ml   Blood pressure 162/75, pulse 73, temperature 101.1 F (38.4 C), temperature source Oral, resp. rate 18, height 5' 8" (1.727 m), weight 180 lb (81.647 kg), SpO2 95.00%. Temp:  [98 F (36.7 C)-101.1 F (38.4 C)] 101.1 F (38.4 C) (11/24 0954) Pulse Rate:  [71-86] 73 (11/24 0954) Resp:  [18-20] 18 (11/24 0954) BP: (155-172)/(57-85) 162/75 mmHg (11/24 0954) SpO2:  [93 %-98 %] 95 % (11/24 0954)    Physical Exam: General: very sleepy but arousable and  oriented x3, not in any acute distress. HEENT: anicteric sclera, , EOMI CVS regular rate, normal r,  no murmur rubs or gallops Chest: clear to auscultation bilaterally, no wheezing, rales or rhonchi Abdomen: soft nontender, nondistended, normal bowel sounds, Extremities: no  clubbing or edema noted bilaterally Skin: no rashes Neuro: nonfocal   Lab Results:  Recent Labs  03/06/13 0555 03/07/13 0550  WBC 16.1* 13.0*  HGB 13.3 12.3*  HCT 38.0* 34.8*  PLT 238 240    BMET  Recent Labs  03/06/13 0555 03/07/13 0550  NA 129* 131*  K 3.1* 3.5  CL 97 99  CO2 20 21  GLUCOSE 100* 97  BUN 10 13  CREATININE 0.68 0.69  CALCIUM 8.3* 8.3*    Micro Results: Recent Results (from the past 240 hour(s))  CULTURE, BLOOD (ROUTINE X 2)     Status: None    Collection Time    02/28/13 11:35 PM      Result Value Range Status   Specimen Description BLOOD LEFT WRIST   Final   Special Requests BOTTLES DRAWN AEROBIC AND ANAEROBIC 10CC EACH   Final   Culture  Setup Time     Final   Value: 03/01/2013 04:45     Performed at [facility]   Culture     Final   Value: NO GROWTH 5 DAYS     Performed at [facility]   Report Status 03/07/2013 FINAL   Final  CULTURE, BLOOD (ROUTINE X 2)     Status: None   Collection Time    02/28/13 11:40 PM      Result Value Range Status   Specimen Description BLOOD LEFT HAND   Final   Special Requests BOTTLES DRAWN AEROBIC ONLY 5CC   Final   Culture  Setup Time     Final   Value: 03/01/2013 04:44     Performed at [facility]   Culture     Final   Value: NO GROWTH 5 DAYS     Performed at [facility]   Report Status 03/07/2013 FINAL   Final  URINE CULTURE     Status: None   Collection Time    03/01/13 12:16 AM      Result Value Range Status   Specimen Description URINE, CLEAN CATCH   Final   Special Requests NONE   Final   Culture  Setup Time     Final   Value: 03/01/2013 01:00     Performed at [facility]   Colony Count     Final   Value: NO GROWTH     Performed at [facility]   Culture     Final   Value: NO GROWTH     Performed at [facility]   Report Status 03/02/2013 FINAL   Final    Studies/Results: Mr Brain W Wo Contrast  03/05/2013   CLINICAL DATA:  Cerebral hematoma. Headache and fever. Rule out abscess  EXAM: MRI HEAD WITHOUT AND WITH CONTRAST  TECHNIQUE: Multiplanar, multiecho pulse sequences of the brain and surrounding structures were obtained without and with intravenous contrast.  CONTRAST:  1 MULTIHANCE GADOBENATE DIMEGLUMINE 529 MG/ML IV SOLN  COMPARISON:  CT head 02/28/2013  FINDINGS: Subacute hematoma in the right temporal parietal lobe measures 37 x 77 mm. Thick wall of methemoglobin is seen around the hematoma. Findings are  not likely due to abscess as there is not   a large amount of surrounding white matter edema. There is a mild amount of white matter edema. There is local mass effect.  Diffusion-weighted imaging reveals small areas of restricted diffusion in the right occipital parietal lobe which may be due to small embolic infarcts. There is a small area of restricted diffusion in the left temporoparietal cortex which may be a small acute infarct. The hematoma also shows restricted diffusion as expected.  Ventricle size is normal. Chronic microvascular ischemic changes in the white matter.  Dural sinuses appear patent.  No evidence of venous thrombosis.  Image quality degraded by moderate motion.  IMPRESSION: Large subacute hematoma right temporoparietal lobe. Superimposed abscess not considered likely.  Small areas of acute infarct in the right occipital parietal lobe and left temporoparietal lobe, question emboli.   Electronically Signed   By: Charles  Clark M.D.   On: 03/05/2013 16:48      Assessment/Plan: Walter Larsen is a 76 y.o. male with  year-old man with recent [facility] with Right temporal/occipital parenchymal hematoma dc and then admitted with fevers, headaches and confusion. MRI read as c/w hematoma in Right temporo-parietal lobe. There were also small areas of acute infarct int he right occipital parietal lboe and left temproparietal lobe that made them raise the question of emboli   #1 FUO:  -- Given the concern for embolic CVA raised by radiology I have recommended proceed with TEE to look for evidence of endocarditis  If TEE without evidence for endocarditis and we have other possible non ID explanations for embolic acute CVA then I would stop his abx and then consider that perhaps his fevers are coming from the large amount of blood in his brain and not due to an actual infection  I would make sure that all diagnostic studies have been done that Neurology would want  beyond TEE for wrokup for embolic CVA   LOS: 7 days   Analeah Brame [place] 03/07/2013, 11:31 AM   

## 2013-03-07 NOTE — Progress Notes (Signed)
[facility]                                                                                Patient Demographics  Walter Larsen, is a 77 y.o. male, DOB - 1935-09-24, NWG:[id number]  Admit date - 02/28/2013   Admitting Physician Lynden Oxford, MD  Outpatient Primary MD for the patient is Provider Not In System  LOS - 7   Chief Complaint  Patient presents with  . Altered Mental Status        Assessment & Plan   Principal Problem:   Fever, unspecified Active Problems:   Hemorrhagic stroke   Essential hypertension, benign   Other and unspecified hyperlipidemia   Bradycardia   Hypokalemia  Fever -Unknown etiology, may be secondary to Recent Temporal/occiptal hematoma -Urine analysis negative -Urine and blood cultures shows no growth at this time.  -Continue empiric antibiotic coverage with vancomycin (day7) and ceftrixone (day 6), flagyl (day 5) -ID and neurosurgery consulted and following -MRI suggests emboli -TEE planned for today  Hypokalemia -K 3.5, will continue to monitor -Will also obtain magnesium and phosphorus levels.  Right temporal/occipital parenchymal hematoma -Neurosurgery and neurology consulted and following.   -CT Scan: Right temporal/occipital parenchymal hematoma, measuring up to 7 cm in length. Trace intraventricular extension; no hydrocephalus.  Encephalopathy/ AMS -Resolved, no longer altered, at baseline -Neurology following  -EEG: This is a normal EEG recording during sleep. No evidence of an epileptic disorder was seen.  Bradycardia -Resovled  HTN -Continue metoprolol 50mg  BID. Continue hydralazine PRN.  Hyperlipidemia -Continue atorvastatin   Hyponatremia -Improving, Na 131, will continue IVF.  May be secondary to insensible losses.  Code Status: Full  Family Communication: Wife at bedside  Disposition Plan: Admitted. Planned for TEE today.   Procedures EEG: This is a normal EEG recording during sleep. No evidence of an  epileptic disorder was seen.  MRI IMPRESSION:  Large subacute hematoma right temporoparietal lobe. Superimposed abscess not considered likely.  Small areas of acute infarct in the right occipital parietal lobe and left temporoparietal lobe, question emboli.  Consults   Neurology Neurosurgery Infectious Disease Cardiology  DVT Prophylaxis  SCDs  Lab Results  Component Value Date   PLT 240 03/07/2013    Medications  Scheduled Meds: . atorvastatin  20 mg Oral QHS  . cefTRIAXone (ROCEPHIN)  IV  2 g Intravenous Q12H  . feeding supplement (ENSURE COMPLETE)  237 mL Oral TID  . LORazepam  1 mg Intravenous Once  . metoprolol tartrate  50 mg Oral BID  . pantoprazole  40 mg Oral Daily  . vancomycin  1,250 mg Intravenous Q8H   Continuous Infusions: . sodium chloride 100 mL/hr at 03/06/13 2341   PRN Meds:.acetaminophen, cyclobenzaprine, hydrALAZINE, ondansetron (ZOFRAN) IV, traMADol  Antibiotics    Anti-infectives   Start     Dose/Rate Route Frequency Ordered Stop   03/05/13 0800  vancomycin (VANCOCIN) 1,250 mg in sodium chloride 0.9 % 250 mL IVPB     1,250 mg 166.7 mL/hr over 90 Minutes Intravenous Every 8 hours 03/05/13 0225     03/03/13 0300  cefTRIAXone (ROCEPHIN) 2 g in dextrose 5 % 50 mL IVPB     2  g 100 mL/hr over 30 Minutes Intravenous Every 12 hours 03/02/13 1453     03/02/13 1300  cefTRIAXone (ROCEPHIN) 2 g in dextrose 5 % 50 mL IVPB  Status:  Discontinued     2 g 100 mL/hr over 30 Minutes Intravenous Every 24 hours 03/02/13 1241 03/02/13 1453   03/02/13 1300  metroNIDAZOLE (FLAGYL) IVPB 500 mg  Status:  Discontinued     500 mg 100 mL/hr over 60 Minutes Intravenous Every 6 hours 03/02/13 1241 03/07/13 0914   03/02/13 1300  vancomycin (VANCOCIN) IVPB 1000 mg/200 mL premix  Status:  Discontinued     1,000 mg 200 mL/hr over 60 Minutes Intravenous Every 12 hours 03/02/13 1252 03/05/13 0224   03/01/13 1400  cefTRIAXone (ROCEPHIN) 2 g in dextrose 5 % 50 mL IVPB  Status:   Discontinued     2 g 100 mL/hr over 30 Minutes Intravenous Every 24 hours 03/01/13 1245 03/02/13 0952   03/01/13 1200  vancomycin (VANCOCIN) IVPB 1000 mg/200 mL premix  Status:  Discontinued     1,000 mg 200 mL/hr over 60 Minutes Intravenous Every 12 hours 03/01/13 0353 03/02/13 0952   03/01/13 0600  piperacillin-tazobactam (ZOSYN) IVPB 3.375 g  Status:  Discontinued     3.375 g 12.5 mL/hr over 240 Minutes Intravenous 3 times per day 03/01/13 0353 03/01/13 1245   02/28/13 2300  piperacillin-tazobactam (ZOSYN) IVPB 3.375 g     3.375 g 100 mL/hr over 30 Minutes Intravenous  Once 02/28/13 2249 03/01/13 0007   02/28/13 2300  vancomycin (VANCOCIN) IVPB 1000 mg/200 mL premix     1,000 mg 200 mL/hr over 60 Minutes Intravenous  Once 02/28/13 2249 03/01/13 0104       Time Spent in minutes   20 minutes   [name] D.O. on 03/07/2013 at 10:45 AM  Between 7am to 7pm - Pager - [phone]  After 7pm go to [email or web address] - password [email or web address]  And look for the night coverage person covering for me after hours  [facility] Office  [phone]    Subjective:   Walter Larsen seen and examined today. Patient continues to complain of headache and some neck pain and fevers.  He continues to feel hot and weak. Patient denies dizziness, chest pain, shortness of breath, abdominal pain, N/V/D/C, new weakness, numbess, tingling.    Objective:   Filed Vitals:   03/06/13 2139 03/07/13 0143 03/07/13 0557 03/07/13 0954  BP: 172/71 169/85 164/68 162/75  Pulse: 86 75 73 73  Temp: 98.8 F (37.1 C) 98.6 F (37 C) 100.9 F (38.3 C) 101.1 F (38.4 C)  TempSrc: Oral Oral Oral Oral  Resp: 20 18 18 18   Height:      Weight:      SpO2: 96% 94% 93% 95%    Wt Readings from Last 3 Encounters:  03/01/13 81.647 kg (180 lb)  03/01/13 81.647 kg (180 lb)     Intake/Output Summary (Last 24 hours) at 03/07/13 1045 Last data filed at 03/07/13 0751  Gross per 24 hour  Intake    687 ml   Output   3200 ml  Net  -2513 ml    Exam  General: Well developed, well nourished, NAD, appears stated age  HEENT: NCAT, Anicteic Sclera, mucous membranes moist.   Neck: Supple, no JVD, no masses  Cardiovascular: S1 S2 auscultated, no rubs, murmurs or gallops. Regular rate and rhythm.  Respiratory: Clear to auscultation bilaterally with equal chest rise  Abdomen: Soft, nontender, nondistended, + bowel sounds  Extremities: warm dry without cyanosis clubbing or edema  Neuro: AAOx3, no neurological deficits  Skin: Without rashes exudates or nodules  Psych: Normal affect and demeanor with intact judgement and insight  Data Review   Micro Results Recent Results (from the past 240 hour(s))  CULTURE, BLOOD (ROUTINE X 2)     Status: None   Collection Time    02/28/13 11:35 PM      Result Value Range Status   Specimen Description BLOOD LEFT WRIST   Final   Special Requests BOTTLES DRAWN AEROBIC AND ANAEROBIC 10CC EACH   Final   Culture  Setup Time     Final   Value: 03/01/2013 04:45     Performed at [facility]   Culture     Final   Value: NO GROWTH 5 DAYS     Performed at [facility]   Report Status 03/07/2013 FINAL   Final  CULTURE, BLOOD (ROUTINE X 2)     Status: None   Collection Time    02/28/13 11:40 PM      Result Value Range Status   Specimen Description BLOOD LEFT HAND   Final   Special Requests BOTTLES DRAWN AEROBIC ONLY 5CC   Final   Culture  Setup Time     Final   Value: 03/01/2013 04:44     Performed at [facility]   Culture     Final   Value: NO GROWTH 5 DAYS     Performed at [facility]   Report Status 03/07/2013 FINAL   Final  URINE CULTURE     Status: None   Collection Time    03/01/13 12:16 AM      Result Value Range Status   Specimen Description URINE, CLEAN CATCH   Final   Special Requests NONE   Final   Culture  Setup Time     Final   Value: 03/01/2013 01:00     Performed at [facility]  Count     Final   Value: NO GROWTH     Performed at [facility]   Culture     Final   Value: NO GROWTH     Performed at [facility]   Report Status 03/02/2013 FINAL   Final    Radiology Reports Dg Chest 2 View  03/01/2013   CLINICAL DATA:  Fever.  EXAM: CHEST - 2 VIEW  COMPARISON:  02/28/2013  FINDINGS: The heart size and mediastinal contours are within normal limits. There is no evidence of pulmonary edema, consolidation, pneumothorax, nodule or pleural fluid. Stable degenerative changes are seen in the thoracic spine  IMPRESSION: No active disease.   Electronically Signed   By: Irish Lack M.D.   On: 03/01/2013 16:35   Ct Head Wo Contrast  02/28/2013   CLINICAL DATA:  Altered mental status.  Stroke.  EXAM: CT HEAD WITHOUT CONTRAST  TECHNIQUE: Contiguous axial images were obtained from the base of the skull through the vertex without intravenous contrast.  COMPARISON:  None.  FINDINGS: Skull and Sinuses:No significant abnormality.  Orbits: Bilateral cataract resection.  Brain: Acute hemorrhage in the posterior right cerebral hemisphere, located in the posterior temporal and occipital regions. The hematoma is irregularly shaped, but the entire area of abnormality measures 7cm in length, 3 cm in width. There is a moderate amount of surrounding cerebral edema. Minimal intraventricular extension into the right lateral ventricle. No herniation, midline shift, or hydrocephalus.  Chronic small  vessel ischemic white matter disease with patchy bilateral cerebral white matter low density. Extensive intracranial atherosclerotic calcification.  Critical Value/emergent results were called by telephone at the time of interpretation on 02/28/2013 at 11:13 PM to Dr.JOSHUA ZAVITZ , who verbally acknowledged these results.  IMPRESSION: 1. Right temporal/occipital parenchymal hematoma, measuring up to 7 cm in length. Trace intraventricular extension; no hydrocephalus. 2. Chronic small vessel  ischemic white matter disease.   Electronically Signed   By: Tiburcio Pea M.D.   On: 02/28/2013 23:16   Dg Chest Portable 1 View  02/28/2013   CLINICAL DATA:  Altered mental status.  EXAM: PORTABLE CHEST - 1 VIEW  COMPARISON:  None.  FINDINGS: The heart size and mediastinal contours are within normal limits. Both lungs are clear. The visualized skeletal structures are unremarkable.  IMPRESSION: No active disease.   Electronically Signed   By: Tiburcio Pea M.D.   On: 02/28/2013 23:03    CBC  Recent Labs Lab 02/28/13 2245 03/01/13 0615  03/03/13 0545 03/04/13 0345 03/05/13 0945 03/06/13 0555 03/07/13 0550  WBC 15.5* 12.0*  < > 11.2* 14.2* 13.7* 16.1* 13.0*  HGB 14.8 14.0  < > 13.2 14.0 13.2 13.3 12.3*  HCT 41.2 40.0  < > 37.1* 40.2 37.9* 38.0* 34.8*  PLT 220 181  < > 186 196 213 238 240  MCV 91.2 91.1  < > 90.5 91.8 91.5 91.3 90.4  MCH 32.7 31.9  < > 32.2 32.0 31.9 32.0 31.9  MCHC 35.9 35.0  < > 35.6 34.8 34.8 35.0 35.3  RDW 12.9 13.0  < > 13.3 13.3 13.5 13.4 13.6  LYMPHSABS 1.4 1.8  --   --   --   --   --   --   MONOABS 2.4* 2.2*  --   --   --   --   --   --   EOSABS 0.0 0.0  --   --   --   --   --   --   BASOSABS 0.0 0.0  --   --   --   --   --   --   < > = values in this interval not displayed.  Chemistries   Recent Labs Lab 02/28/13 2250 03/01/13 0615 03/02/13 0414 03/03/13 0545 03/04/13 0345 03/06/13 0555 03/07/13 0550  NA 131* 137 134* 129* 131* 129* 131*  K 3.3* 3.6 3.3* 3.4* 3.6 3.1* 3.5  CL 97 104 101 97 97 97 99  CO2 22 20 21 19 23 20 21   GLUCOSE 138* 111* 128* 109* 123* 100* 97  BUN 17 15 14 10 10 10 13   CREATININE 0.85 0.98 0.82 0.79 0.78 0.68 0.69  CALCIUM 9.4 8.7 8.5 8.1* 8.6 8.3* 8.3*  MG  --   --   --   --   --  2.0  --   AST 37 31  --   --   --   --   --   ALT 32 28  --   --   --   --   --   ALKPHOS 41 39  --   --   --   --   --   BILITOT 1.3* 1.1  --   --   --   --   --     ------------------------------------------------------------------------------------------------------------------ estimated creatinine clearance is 78.4 ml/min (by C-G formula based on Cr of 0.69). ------------------------------------------------------------------------------------------------------------------ No results found for this basename: HGBA1C,  in the last 72 hours ------------------------------------------------------------------------------------------------------------------ No  results found for this basename: CHOL, HDL, LDLCALC, TRIG, CHOLHDL, LDLDIRECT,  in the last 72 hours ------------------------------------------------------------------------------------------------------------------ No results found for this basename: TSH, T4TOTAL, FREET3, T3FREE, THYROIDAB,  in the last 72 hours ------------------------------------------------------------------------------------------------------------------ No results found for this basename: VITAMINB12, FOLATE, FERRITIN, TIBC, IRON, RETICCTPCT,  in the last 72 hours  Coagulation profile No results found for this basename: INR, PROTIME,  in the last 168 hours  No results found for this basename: DDIMER,  in the last 72 hours  Cardiac Enzymes No results found for this basename: CK, CKMB, TROPONINI, MYOGLOBIN,  in the last 168 hours ------------------------------------------------------------------------------------------------------------------ No components found with this basename: POCBNP,

## 2013-03-07 NOTE — Progress Notes (Signed)
Patient sent down for TEE. Assessments remained unchanged.

## 2013-03-07 NOTE — Progress Notes (Signed)
Echocardiogram Echocardiogram Transesophageal has been performed.  Walter Larsen 03/07/2013, 2:36 PM

## 2013-03-07 NOTE — Progress Notes (Signed)
Patient has been NPO since Mid-night.

## 2013-03-07 NOTE — Interval H&P Note (Signed)
History and Physical Interval Note:  03/07/2013 2:41 PM  Walter Larsen  has presented today for surgery, with the diagnosis of stroke  The various methods of treatment have been discussed with the patient and family. After consideration of risks, benefits and other options for treatment, the patient has consented to  Procedure(s): TRANSESOPHAGEAL ECHOCARDIOGRAM (TEE) (N/A) as a surgical intervention .  The patient's history has been reviewed, patient examined, no change in status, stable for surgery.  I have reviewed the patient's chart and labs.  Questions were answered to the patient's satisfaction.     [name]

## 2013-03-07 NOTE — Progress Notes (Signed)
ANTIBIOTIC CONSULT NOTE - FOLLOW UP  Pharmacy Consult:  Vancomycin Indication:  Rule out brain abscess  Allergies  Allergen Reactions  . Ativan [Lorazepam]     confusion    Patient Measurements: Height: 5\' 8"  (172.7 cm) Weight: 180 lb (81.647 kg) IBW/kg (Calculated) : 68.4  Vital Signs: Temp: 98.5 F (36.9 C) (11/24 1511) Temp src: Oral (11/24 1511) BP: 143/74 mmHg (11/24 1530) Pulse Rate: 70 (11/24 1530) Intake/Output from previous day: 11/23 0701 - 11/24 0700 In: 937 [P.O.:237; IV Piggyback:700] Out: 1800 [Urine:1800] Intake/Output from this shift: Total I/O In: -  Out: 2000 [Urine:2000]  Labs:  Recent Labs  03/05/13 0945 03/06/13 0555 03/07/13 0550  WBC 13.7* 16.1* 13.0*  HGB 13.2 13.3 12.3*  PLT 213 238 240  CREATININE  --  0.68 0.69   Estimated Creatinine Clearance: 78.4 ml/min (by C-G formula based on Cr of 0.69).  Recent Labs  03/07/13 1535  VANCOTROUGH 18.8     Microbiology: Recent Results (from the past 720 hour(s))  CULTURE, BLOOD (ROUTINE X 2)     Status: None   Collection Time    02/28/13 11:35 PM      Result Value Range Status   Specimen Description BLOOD LEFT WRIST   Final   Special Requests BOTTLES DRAWN AEROBIC AND ANAEROBIC 10CC EACH   Final   Culture  Setup Time     Final   Value: 03/01/2013 04:45     Performed at [facility]   Culture     Final   Value: NO GROWTH 5 DAYS     Performed at [facility]   Report Status 03/07/2013 FINAL   Final  CULTURE, BLOOD (ROUTINE X 2)     Status: None   Collection Time    02/28/13 11:40 PM      Result Value Range Status   Specimen Description BLOOD LEFT HAND   Final   Special Requests BOTTLES DRAWN AEROBIC ONLY 5CC   Final   Culture  Setup Time     Final   Value: 03/01/2013 04:44     Performed at [facility]   Culture     Final   Value: NO GROWTH 5 DAYS     Performed at [facility]   Report Status 03/07/2013 FINAL   Final  URINE CULTURE     Status:  None   Collection Time    03/01/13 12:16 AM      Result Value Range Status   Specimen Description URINE, CLEAN CATCH   Final   Special Requests NONE   Final   Culture  Setup Time     Final   Value: 03/01/2013 01:00     Performed at [facility] Count     Final   Value: NO GROWTH     Performed at [facility]   Culture     Final   Value: NO GROWTH     Performed at [facility]   Report Status 03/02/2013 FINAL   Final      Assessment: 76 YOM started on vancomycin and Rocephin for rule out brain abscess.  03/05/13 MRI showed "superimposed abscess not considered likely."  No obvious vegetation per Cards.  Patient is on day #8 of antibiotics, day #7 of vancomycin and Rocephin.  Vancomycin trough therapeutic and patient's renal function has been stable.  11/17 BC x 2 - Negative 11/18 UC - ng final  Zosyn 11/17 >> 11/18 Ceftriaxone  11/18 >> Vanc 11/18 >>   Goal of Therapy:  Vancomycin trough level 15-20 mcg/ml   Plan:  - Continue vanc 1250mg  IV Q8H - Continue Rocephin 2gm IV Q12H as ordered - Monitor renal fxn, clinical course, PRN vanc trough to r/o accumulation - F/U with abx plan (see ID / Cards notes)    Nevin Grizzle D. Laney Potash, PharmD, BCPS Pager:  [phone] 03/07/2013, 5:29 PM

## 2013-03-07 NOTE — Progress Notes (Signed)
Patient ID: Walter Larsen, male   DOB: Jul 07, 1935, 78 y.o.   MRN: [id number] MRI reviewed. No evidence of abscess. Possibility of embolic/cardiac phenomenon being explored. I will sign off at this time. Patient's sensorium appears to be much improved. Please reconsult if necessary.

## 2013-03-07 NOTE — H&P (View-Only) (Signed)
[facility]   Day # 7  ceftriaxone Day # 6 flagyl Day # 8 vancomcyin  Subjective: No new complaints    Antibiotics:  Anti-infectives   Start     Dose/Rate Route Frequency Ordered Stop   03/05/13 0800  vancomycin (VANCOCIN) 1,250 mg in sodium chloride 0.9 % 250 mL IVPB     1,250 mg 166.7 mL/hr over 90 Minutes Intravenous Every 8 hours 03/05/13 0225     03/03/13 0300  cefTRIAXone (ROCEPHIN) 2 g in dextrose 5 % 50 mL IVPB     2 g 100 mL/hr over 30 Minutes Intravenous Every 12 hours 03/02/13 1453     03/02/13 1300  cefTRIAXone (ROCEPHIN) 2 g in dextrose 5 % 50 mL IVPB  Status:  Discontinued     2 g 100 mL/hr over 30 Minutes Intravenous Every 24 hours 03/02/13 1241 03/02/13 1453   03/02/13 1300  metroNIDAZOLE (FLAGYL) IVPB 500 mg  Status:  Discontinued     500 mg 100 mL/hr over 60 Minutes Intravenous Every 6 hours 03/02/13 1241 03/07/13 0914   03/02/13 1300  vancomycin (VANCOCIN) IVPB 1000 mg/200 mL premix  Status:  Discontinued     1,000 mg 200 mL/hr over 60 Minutes Intravenous Every 12 hours 03/02/13 1252 03/05/13 0224   03/01/13 1400  cefTRIAXone (ROCEPHIN) 2 g in dextrose 5 % 50 mL IVPB  Status:  Discontinued     2 g 100 mL/hr over 30 Minutes Intravenous Every 24 hours 03/01/13 1245 03/02/13 0952   03/01/13 1200  vancomycin (VANCOCIN) IVPB 1000 mg/200 mL premix  Status:  Discontinued     1,000 mg 200 mL/hr over 60 Minutes Intravenous Every 12 hours 03/01/13 0353 03/02/13 0952   03/01/13 0600  piperacillin-tazobactam (ZOSYN) IVPB 3.375 g  Status:  Discontinued     3.375 g 12.5 mL/hr over 240 Minutes Intravenous 3 times per day 03/01/13 0353 03/01/13 1245   02/28/13 2300  piperacillin-tazobactam (ZOSYN) IVPB 3.375 g     3.375 g 100 mL/hr over 30 Minutes Intravenous  Once 02/28/13 2249 03/01/13 0007   02/28/13 2300  vancomycin (VANCOCIN) IVPB 1000 mg/200 mL premix     1,000 mg 200 mL/hr over 60 Minutes Intravenous  Once 02/28/13 2249 03/01/13 0104       Medications: Scheduled Meds: . atorvastatin  20 mg Oral QHS  . cefTRIAXone (ROCEPHIN)  IV  2 g Intravenous Q12H  . feeding supplement (ENSURE COMPLETE)  237 mL Oral TID  . LORazepam  1 mg Intravenous Once  . metoprolol tartrate  50 mg Oral BID  . pantoprazole  40 mg Oral Daily  . vancomycin  1,250 mg Intravenous Q8H   Continuous Infusions: . sodium chloride 100 mL/hr at 03/06/13 2341   PRN Meds:.acetaminophen, cyclobenzaprine, hydrALAZINE, ondansetron (ZOFRAN) IV, traMADol   Objective: Weight change:   Intake/Output Summary (Last 24 hours) at 03/07/13 1131 Last data filed at 03/07/13 1125  Gross per 24 hour  Intake    687 ml  Output   3800 ml  Net  -3113 ml   Blood pressure 162/75, pulse 73, temperature 101.1 F (38.4 C), temperature source Oral, resp. rate 18, height 5\' 8"  (1.727 m), weight 180 lb (81.647 kg), SpO2 95.00%. Temp:  [98 F (36.7 C)-101.1 F (38.4 C)] 101.1 F (38.4 C) (11/24 0954) Pulse Rate:  [71-86] 73 (11/24 0954) Resp:  [18-20] 18 (11/24 0954) BP: (155-172)/(57-85) 162/75 mmHg (11/24 0954) SpO2:  [93 %-98 %] 95 % (11/24 0954)  Physical Exam: General: very sleepy but arousable and  oriented x3, not in any acute distress. HEENT: anicteric sclera, , EOMI CVS regular rate, normal r,  no murmur rubs or gallops Chest: clear to auscultation bilaterally, no wheezing, rales or rhonchi Abdomen: soft nontender, nondistended, normal bowel sounds, Extremities: no  clubbing or edema noted bilaterally Skin: no rashes Neuro: nonfocal   Lab Results:  Recent Labs  03/06/13 0555 03/07/13 0550  WBC 16.1* 13.0*  HGB 13.3 12.3*  HCT 38.0* 34.8*  PLT 238 240    BMET  Recent Labs  03/06/13 0555 03/07/13 0550  NA 129* 131*  K 3.1* 3.5  CL 97 99  CO2 20 21  GLUCOSE 100* 97  BUN 10 13  CREATININE 0.68 0.69  CALCIUM 8.3* 8.3*    Micro Results: Recent Results (from the past 240 hour(s))  CULTURE, BLOOD (ROUTINE X 2)     Status: None    Collection Time    02/28/13 11:35 PM      Result Value Range Status   Specimen Description BLOOD LEFT WRIST   Final   Special Requests BOTTLES DRAWN AEROBIC AND ANAEROBIC 10CC EACH   Final   Culture  Setup Time     Final   Value: 03/01/2013 04:45     Performed at [facility]   Culture     Final   Value: NO GROWTH 5 DAYS     Performed at [facility]   Report Status 03/07/2013 FINAL   Final  CULTURE, BLOOD (ROUTINE X 2)     Status: None   Collection Time    02/28/13 11:40 PM      Result Value Range Status   Specimen Description BLOOD LEFT HAND   Final   Special Requests BOTTLES DRAWN AEROBIC ONLY 5CC   Final   Culture  Setup Time     Final   Value: 03/01/2013 04:44     Performed at [facility]   Culture     Final   Value: NO GROWTH 5 DAYS     Performed at [facility]   Report Status 03/07/2013 FINAL   Final  URINE CULTURE     Status: None   Collection Time    03/01/13 12:16 AM      Result Value Range Status   Specimen Description URINE, CLEAN CATCH   Final   Special Requests NONE   Final   Culture  Setup Time     Final   Value: 03/01/2013 01:00     Performed at [facility] Count     Final   Value: NO GROWTH     Performed at [facility]   Culture     Final   Value: NO GROWTH     Performed at [facility]   Report Status 03/02/2013 FINAL   Final    Studies/Results: Walter Larsen Contrast  03/05/2013   CLINICAL DATA:  Cerebral hematoma. Headache and fever. Rule out abscess  EXAM: MRI HEAD WITHOUT AND WITH CONTRAST  TECHNIQUE: Multiplanar, multiecho pulse sequences of the brain and surrounding structures were obtained without and with intravenous contrast.  CONTRAST:  1 MULTIHANCE GADOBENATE DIMEGLUMINE 529 MG/ML IV SOLN  COMPARISON:  CT head 02/28/2013  FINDINGS: Subacute hematoma in the right temporal parietal lobe measures 37 x 77 mm. Thick wall of methemoglobin is seen around the hematoma. Findings are  not likely due to abscess as there is not  a large amount of surrounding white matter edema. There is a mild amount of white matter edema. There is local mass effect.  Diffusion-weighted imaging reveals small areas of restricted diffusion in the right occipital parietal lobe which may be due to small embolic infarcts. There is a small area of restricted diffusion in the left temporoparietal cortex which may be a small acute infarct. The hematoma also shows restricted diffusion as expected.  Ventricle size is normal. Chronic microvascular ischemic changes in the white matter.  Dural sinuses appear patent.  No evidence of venous thrombosis.  Image quality degraded by moderate motion.  IMPRESSION: Large subacute hematoma right temporoparietal lobe. Superimposed abscess not considered likely.  Small areas of acute infarct in the right occipital parietal lobe and left temporoparietal lobe, question emboli.   Electronically Signed   By: Marlan Palau M.D.   On: 03/05/2013 16:48      Assessment/Plan: Walter Larsen is a 77 y.o. male with  year-old man with recent admission [facility] with Right temporal/occipital parenchymal hematoma dc and then admitted with fevers, headaches and confusion. MRI read as c/w hematoma in Right temporo-parietal lobe. There were also small areas of acute infarct int he right occipital parietal lboe and left temproparietal lobe that made them raise the question of emboli   #1 FUO:  -- Given the concern for embolic CVA raised by radiology I have recommended proceed with TEE to look for evidence of endocarditis  If TEE without evidence for endocarditis and we have other possible non ID explanations for embolic acute CVA then I would stop his abx and then consider that perhaps his fevers are coming from the large amount of blood in his brain and not due to an actual infection  I would make sure that all diagnostic studies have been done that Neurology would want  beyond TEE for wrokup for embolic CVA   LOS: 7 days   Acey Lav 03/07/2013, 11:31 AM

## 2013-03-07 NOTE — Progress Notes (Signed)
NUTRITION FOLLOW UP  Intervention:    Continue Ensure Complete BID (350 kcals, 13 gm protein per 8 fl oz bottle) RD to follow for nutrition care plan  Nutrition Dx:   Inadequate oral intake, improved  Goal:   Pt to meet >/= 90% of their estimated nutrition needs, progressing  Monitor:   PO & supplemental intake, weight, labs, I/O's  Assessment:   PMHx significant for HTN, dyslipidemia. Recently admitted to [facility] with hemorrhagic stroke, discharged the day before yesterday. Admitted with AMS, hallucinations, HA, fever and nausea. Work-up ongoing  Patient s/p EEG 11/18 -- no evidenced of epileptic disorder.  Infectious Disease following for fevers. PO intake variable at 50-75% per flowsheet records.  Receiving Ensure Complete supplements -- will decrease frequency as he's not drinking 3 per day.  Plan is for TEE today.  Height: Ht Readings from Last 1 Encounters:  03/01/13 5\' 8"  (1.727 m)    Weight Status:   Wt Readings from Last 1 Encounters:  03/01/13 180 lb (81.647 kg)    Re-estimated needs:  Kcal: 1700-1900 Protein: 80-90 gm Fluid: 1.7-1.9 L  Skin: Stage II pressure ulcer to sacrum  Diet Order: NPO   Intake/Output Summary (Last 24 hours) at 03/07/13 1157 Last data filed at 03/07/13 1125  Gross per 24 hour  Intake    687 ml  Output   3800 ml  Net  -3113 ml    Labs:   Recent Labs Lab 03/04/13 0345 03/06/13 0555 03/07/13 0550  NA 131* 129* 131*  K 3.6 3.1* 3.5  CL 97 97 99  CO2 23 20 21   BUN 10 10 13   CREATININE 0.78 0.68 0.69  CALCIUM 8.6 8.3* 8.3*  MG  --  2.0  --   PHOS  --  2.1*  --   GLUCOSE 123* 100* 97    CBG (last 3)   Recent Labs  03/07/13 1100  GLUCAP 88    Scheduled Meds: . atorvastatin  20 mg Oral QHS  . cefTRIAXone (ROCEPHIN)  IV  2 g Intravenous Q12H  . feeding supplement (ENSURE COMPLETE)  237 mL Oral TID  . LORazepam  1 mg Intravenous Once  . metoprolol tartrate  50 mg Oral BID  . pantoprazole  40 mg Oral  Daily  . vancomycin  1,250 mg Intravenous Q8H    Continuous Infusions: . sodium chloride 100 mL/hr at 03/06/13 2341    Maureen Chatters, RD, LDN Pager #: [phone] After-Hours Pager #: [phone]

## 2013-03-07 NOTE — Consult Note (Signed)
Stroke Team Progress Note  HISTORY  Walter Larsen is a 77 y.o. male with Past medical history of hypertension, dyslipidemia, recent admission [facility] with hemorrhagic stroke. The patient is admitted for altered mental status, hallucinations and confabulation. Patient also had some frontal headache, some nausea and an episode of urinary incontinence. Patient was found to have a fever. Blood cultures no growth so far. ID is on board, Dr. Zenaida Niece dam evaluated the patient. Patient is currently treated with antibiotics, including vancomycin and ceftriaxone IV. MRI on 11/22 showed large subacute hematoma right temporoparietal lobe. There are small areas of acute infarct in the right occipital parietal lobe and left temporoparietal lobe, question emboli per radiologist. Stroke team was consulted.   Patient was not a TPA candidate secondary to intracranial hemorrhage.   SUBJECTIVE   Overall he feels his condition is unchanged. Patient has fever of 101.1. He doesn't have chest pain, shortness of breath, weakness in extremities. Patient reports having severe headache.  OBJECTIVE Most recent Vital Signs: Filed Vitals:   03/06/13 2139 03/07/13 0143 03/07/13 0557 03/07/13 0954  BP: 172/71 169/85 164/68 162/75  Pulse: 86 75 73 73  Temp: 98.8 F (37.1 C) 98.6 F (37 C) 100.9 F (38.3 C) 101.1 F (38.4 C)  TempSrc: Oral Oral Oral Oral  Resp: 20 18 18 18   Height:      Weight:      SpO2: 96% 94% 93% 95%   CBG (last 3)   Recent Labs  03/07/13 1100  GLUCAP 88    IV Fluid Intake:   . sodium chloride 100 mL/hr at 03/06/13 2341    MEDICATIONS  . [facility] HOLD] atorvastatin  20 mg Oral QHS  . [MAR HOLD] cefTRIAXone (ROCEPHIN)  IV  2 g Intravenous Q12H  . [facility] HOLD] divalproex  500 mg Oral Daily  . [MAR HOLD] feeding supplement (ENSURE COMPLETE)  237 mL Oral BID BM  . [MAR HOLD] LORazepam  1 mg Intravenous Once  . [facility] HOLD] metoprolol tartrate  50 mg Oral BID  . [MAR HOLD] pantoprazole   40 mg Oral Daily  . valproate sodium  1,000 mg Intravenous Once  . [facility] HOLD] vancomycin  1,250 mg Intravenous Q8H   PRN:  [MAR HOLD] acetaminophen, [MAR HOLD] acetaminophen, [MAR HOLD] cyclobenzaprine, [MAR HOLD] hydrALAZINE, [MAR HOLD] ondansetron (ZOFRAN) IV, [MAR HOLD] traMADol  Diet:  NPO Activity:  Bedrest DVT Prophylaxis:  SCD  CLINICALLY SIGNIFICANT STUDIES Basic Metabolic Panel:  Recent Labs Lab 03/04/13 0345 03/06/13 0555 03/07/13 0550  NA 131* 129* 131*  K 3.6 3.1* 3.5  CL 97 97 99  CO2 23 20 21   GLUCOSE 123* 100* 97  BUN 10 10 13   CREATININE 0.78 0.68 0.69  CALCIUM 8.6 8.3* 8.3*  MG  --  2.0  --   PHOS  --  2.1*  --    Liver Function Tests:  Recent Labs Lab 02/28/13 2250 03/01/13 0615  AST 37 31  ALT 32 28  ALKPHOS 41 39  BILITOT 1.3* 1.1  PROT 7.3 6.8  ALBUMIN 3.4* 3.0*   CBC:  Recent Labs Lab 02/28/13 2245 03/01/13 0615  03/06/13 0555 03/07/13 0550  WBC 15.5* 12.0*  < > 16.1* 13.0*  NEUTROABS 11.7* 8.0*  --   --   --   HGB 14.8 14.0  < > 13.3 12.3*  HCT 41.2 40.0  < > 38.0* 34.8*  MCV 91.2 91.1  < > 91.3 90.4  PLT 220 181  < > 238  240  < > = values in this interval not displayed. Coagulation: No results found for this basename: LABPROT, INR,  in the last 168 hours Cardiac Enzymes: No results found for this basename: CKTOTAL, CKMB, CKMBINDEX, TROPONINI,  in the last 168 hours Urinalysis:  Recent Labs Lab 03/01/13 0016  COLORURINE YELLOW  LABSPEC 1.014  PHURINE 7.0  GLUCOSEU NEGATIVE  HGBUR NEGATIVE  BILIRUBINUR NEGATIVE  KETONESUR NEGATIVE  PROTEINUR NEGATIVE  UROBILINOGEN 2.0*  NITRITE NEGATIVE  LEUKOCYTESUR NEGATIVE   Lipid Panel No results found for this basename: chol, trig, hdl, cholhdl, vldl, ldlcalc   HgbA1C  No results found for this basename: HGBA1C    Urine Drug Screen:   No results found for this basename: labopia, cocainscrnur, labbenz, amphetmu, thcu, labbarb    Alcohol Level: No results found for this  basename: ETH,  in the last 168 hours  Mr Laqueta Jean Wo Contrast  03/05/2013   CLINICAL DATA:  Cerebral hematoma. Headache and fever. Rule out abscess  EXAM: MRI HEAD WITHOUT AND WITH CONTRAST  TECHNIQUE: Multiplanar, multiecho pulse sequences of the brain and surrounding structures were obtained without and with intravenous contrast.  CONTRAST:  1 MULTIHANCE GADOBENATE DIMEGLUMINE 529 MG/ML IV SOLN  COMPARISON:  CT head 02/28/2013  FINDINGS: Subacute hematoma in the right temporal parietal lobe measures 37 x 77 mm. Thick wall of methemoglobin is seen around the hematoma. Findings are not likely due to abscess as there is not a large amount of surrounding white matter edema. There is a mild amount of white matter edema. There is local mass effect.  Diffusion-weighted imaging reveals small areas of restricted diffusion in the right occipital parietal lobe which may be due to small embolic infarcts. There is a small area of restricted diffusion in the left temporoparietal cortex which may be a small acute infarct. The hematoma also shows restricted diffusion as expected.  Ventricle size is normal. Chronic microvascular ischemic changes in the white matter.  Dural sinuses appear patent.  No evidence of venous thrombosis.  Image quality degraded by moderate motion.  IMPRESSION: Large subacute hematoma right temporoparietal lobe. Superimposed abscess not considered likely.  Small areas of acute infarct in the right occipital parietal lobe and left temporoparietal lobe, question emboli.   Electronically Signed   By: Marlan Palau M.D.   On: 03/05/2013 16:48   CXR: negaitve   EEG: normal. Therapy Recommendations: start the Depakote for headache and for seizure prevention.   Physical Exam:   Filed Vitals:   03/07/13 0143 03/07/13 0557 03/07/13 0954 03/07/13 1422  BP: 169/85 164/68 162/75 136/59  Pulse: 75 73 73 74  Temp: 98.6 F (37 C) 100.9 F (38.3 C) 101.1 F (38.4 C) 98.7 F (37.1 C)  TempSrc: Oral  Oral Oral Oral  Resp: 18 18 18 20   Height:      Weight:      SpO2: 94% 93% 95% 95%    General: Not in acute distress HEENT: PERRL, EOMI, no scleral icterus, No JVD or bruit Cardiac: S1/S2, RRR, No murmurs, gallops or rubs Pulm: Good air movement bilaterally. Clear to auscultation bilaterally. No rales, wheezing, rhonchi or rubs. Abd: Soft, nondistended, nontender, no rebound pain, no organomegaly, BS present Ext: No edema. 2+DP/PT pulse bilaterally Musculoskeletal: No joint deformities, erythema, or stiffness, ROM full Skin: No rashes.  Neuro: Alert and oriented X3, cranial nerves II-XII grossly intact, muscle strength 5/5 in all extremeties, sensation to light touch intact. Brachial reflex 2+ bilaterally. Knee reflex 1+ bilaterally. Negative  Babinski's sign. Normal finger to nose test. Psych: Patient is not psychotic, no suicidal or hemocidal ideation.  Neurologic Examination:                                                                                                      Mental Status: Alert, oriented, thought content appropriate.  Speech fluent without evidence of aphasia.  Able to follow 3 step commands without difficulty.    Cranial Nerves: II: Visual fields (+) left hemianopsia, pupils equal, round, reactive to light and accommodation III,IV, VI: ptosis not present, extra-ocular motions intact bilaterally V,VII: smile symmetric, facial light touch sensation normal bilaterally VIII: hearing normal bilaterally IX,X: gag reflex present XI: bilateral shoulder shrug XII: midline tongue extension without atrophy or fasciculations  Motor: Right :  Upper extremity   5/5                                       Left:     Upper extremity   5/5             Lower extremity   5/5                                                  Lower extremity   5/5 Tone and bulk:normal tone throughout; no atrophy noted Sensory: Pinprick and light touch intact throughout, bilaterally Deep Tendon  Reflexes:   Right: Upper Extremity                        Left: Upper extremity   biceps (C-5 to C-6) 2/4                       biceps (C-5 to C-6) 2/4 Brachioradialis (C6) 2/4                      Brachioradialis (C6) 2/4  Lower Extremity Lower Extremity   quadriceps (L-2 to L-4) 2/4                 quadriceps (L-2 to L-4) 2/4 Achilles (S1) 2/4                                  Achilles (S1) 2/4  Plantars: Right: downgoing                                Left: downgoing Cerebellar: normal finger-to-nose,  normal heel-to-shin test Gait: not tested CV: pulses palpable throughout     ASSESSMENT Walter Larsen is a 77 y.o. male presenting with AMS and fever. MRI on 11/22 showed large subacute hematoma right temporoparietal lobe. There are small areas of acute infarct  in the right occipital parietal lobe and left temporoparietal lobe, question emboli per radiologist. Stroke team was consulted. Dr. Pearlean Brownie reviewed the image and thinks that small areas of right occipital and parietal lobe change are likely diffusion abnormalities due to subacute blood products due to previous hemorrhage and less likely due to new stroke. Patient was not a TPA candidate secondary to intracranial hemorrhage. On aspirin 325 mg orally every day prior to admission. Now ASA on hold.  Patient with resultant left hemianopsia. Work up underway.  MRI:  large subacute hematoma right temporoparietal lobe. There are small areas of acute infarct in the right occipital parietal lobe and left temporoparietal lobe, question emboli per radiologist CXR: negaitve   EEG: normal. Therapy Recommendations: start the Depakote for headache and for seizure prevention.   Hospital day # 7  TREATMENT/PLAN  Continue to hold ASA   Start Depakote for headache and seizure prevention: Depakote 1000 mg IV, followed by 500 mg oral once a day.  TEE pending   Lorretta Harp, MD PGY3, Internal Medicine Teaching Service Pager: [phone]   I have  personally obtained a history, examined the patient, evaluated imaging results, and formulated the assessment and plan of care. I agree with the above. Delia Heady, MD

## 2013-03-07 NOTE — Op Note (Signed)
No obvious vegetation LAA without thrombus No PFO   FUll report to follow

## 2013-03-07 NOTE — Progress Notes (Signed)
UR complete.  Raynell Upton RN, MSN 

## 2013-03-08 ENCOUNTER — Inpatient Hospital Stay (HOSPITAL_COMMUNITY): Payer: Medicare Other

## 2013-03-08 ENCOUNTER — Encounter (HOSPITAL_COMMUNITY): Payer: Self-pay | Admitting: Internal Medicine

## 2013-03-08 DIAGNOSIS — I2699 Other pulmonary embolism without acute cor pulmonale: Secondary | ICD-10-CM

## 2013-03-08 DIAGNOSIS — G9340 Encephalopathy, unspecified: Secondary | ICD-10-CM

## 2013-03-08 DIAGNOSIS — H04129 Dry eye syndrome of unspecified lacrimal gland: Secondary | ICD-10-CM

## 2013-03-08 DIAGNOSIS — I498 Other specified cardiac arrhythmias: Secondary | ICD-10-CM

## 2013-03-08 DIAGNOSIS — E871 Hypo-osmolality and hyponatremia: Secondary | ICD-10-CM

## 2013-03-08 DIAGNOSIS — H04123 Dry eye syndrome of bilateral lacrimal glands: Secondary | ICD-10-CM | POA: Diagnosis present

## 2013-03-08 LAB — BASIC METABOLIC PANEL
BUN: 19 mg/dL (ref 6–23)
CO2: 20 mEq/L (ref 19–32)
Creatinine, Ser: 1.29 mg/dL (ref 0.50–1.35)
GFR calc non Af Amer: 56 mL/min — ABNORMAL LOW (ref >90)
Glucose, Bld: 103 mg/dL — ABNORMAL HIGH (ref 70–99)
Potassium: 3.5 mEq/L (ref 3.5–5.1)

## 2013-03-08 LAB — CBC
HCT: 37.5 % — ABNORMAL LOW (ref 39.0–52.0)
Hemoglobin: 13.2 g/dL (ref 13.0–17.0)
MCHC: 35.2 g/dL (ref 30.0–36.0)
RBC: 4.13 MIL/uL — ABNORMAL LOW (ref 4.22–5.81)

## 2013-03-08 LAB — URINE MICROSCOPIC-ADD ON

## 2013-03-08 LAB — URINALYSIS, ROUTINE W REFLEX MICROSCOPIC
Bilirubin Urine: NEGATIVE
Glucose, UA: NEGATIVE mg/dL
Ketones, ur: NEGATIVE mg/dL
pH: 5.5 (ref 5.0–8.0)

## 2013-03-08 MED ORDER — FENTANYL CITRATE 0.05 MG/ML IJ SOLN
INTRAMUSCULAR | Status: AC
  Filled 2013-03-08: qty 2

## 2013-03-08 MED ORDER — IOHEXOL 300 MG/ML  SOLN
25.0000 mL | INTRAMUSCULAR | Status: AC
  Administered 2013-03-08 (×2): 25 mL via ORAL

## 2013-03-08 MED ORDER — FENTANYL CITRATE 0.05 MG/ML IJ SOLN
INTRAMUSCULAR | Status: AC | PRN
  Administered 2013-03-08: 25 ug via INTRAVENOUS

## 2013-03-08 MED ORDER — POLYVINYL ALCOHOL 1.4 % OP SOLN
2.0000 [drp] | OPHTHALMIC | Status: DC | PRN
  Administered 2013-03-08 – 2013-03-13 (×8): 2 [drp] via OPHTHALMIC
  Filled 2013-03-08: qty 15

## 2013-03-08 MED ORDER — IOHEXOL 300 MG/ML  SOLN
100.0000 mL | Freq: Once | INTRAMUSCULAR | Status: AC | PRN
  Administered 2013-03-08: 90 mL via INTRAVENOUS

## 2013-03-08 NOTE — Procedures (Signed)
IVC Filter L1 No comp

## 2013-03-08 NOTE — Evaluation (Signed)
Physical Therapy Evaluation Patient Details Name: Walter Larsen MRN: [id number] DOB: 30-Jul-1935 Today's Date: 03/08/2013 Time: 0102-7253 PT Time Calculation (min): 43 min  PT Assessment / Plan / Recommendation History of Present Illness  Walter Larsen is a 77 y.o. male with Past medical history of hypertension, dyslipidemia, recent admission to wake [facility] with hemorrhagic stroke. The patient is presenting with complaints of altered mental status. He was brought in by his family. As per the family since last one week he had significant headache, after which he was taken to wake [facility] for hemorrhagic stroke. From where he was discharged day before yesterday. Today he has been having hallucinations and confabulation. He was talking about basketball match but there was none. He also complained of some further frontal headache and some nausea. There were episodes of staring during which he would take a while to respond. And also he had an episode of urinary incontinence. By the EMS arrived he was found to be febrile as well.  Clinical Impression  Patient demonstrates deficits in functional mobility as indicated below. Patient will benefit from continued skilled PT to address deficits and maximize independence.  Recommend CIR upon discharge. Will continue to see as indicated and progress activity as tolerated.    PT Assessment  Patient needs continued PT services    Follow Up Recommendations  CIR    Does the patient have the potential to tolerate intense rehabilitation      Barriers to Discharge        Equipment Recommendations  Other (comment) (TBD)    Recommendations for Other Services Rehab consult   Frequency Min 3X/week    Precautions / Restrictions Precautions Precautions: Fall Restrictions Weight Bearing Restrictions: No   Pertinent Vitals/Pain Repots neck and head ache but no value given.      Mobility  Bed Mobility Bed Mobility: Rolling Right;Rolling  Left;Left Sidelying to Sit;Sitting - Scoot to [place] of Bed;Sit to Sidelying Left Rolling Right: 3: Mod assist Rolling Left: 3: Mod assist Left Sidelying to Sit: 1: +2 Total assist;HOB flat Left Sidelying to Sit: Patient Percentage: 20% Sitting - Scoot to Edge of Bed: 4: Min assist Sit to Sidelying Left: 1: +2 Total assist Sit to Sidelying Left: Patient Percentage: 20% Details for Bed Mobility Assistance: Assist for trunk control and positioning as well as assist for hip rotation to EOB and LEs back to bed. Patient able to initiate movement with max VCs and tactile cues but unable to carry out task despite increased time. Transfers Transfers: Sit to Stand;Stand to Sit;Stand Pivot Transfers Sit to Stand: 1: +2 Total assist;From bed;From chair/3-in-1 Sit to Stand: Patient Percentage: 40% Stand to Sit: 1: +2 Total assist;To bed;To chair/3-in-1 Stand to Sit: Patient Percentage: 40% Stand Pivot Transfers: 1: +2 Total assist Stand Pivot Transfers: Patient Percentage: 50% Details for Transfer Assistance: Max cues for upright posture and intitiation of steps, Assist to maintain upright, Bilateral LE blocking in place, poor coordination of mobility.   Ambulation/Gait Ambulation/Gait Assistance: 1: +2 Total assist Ambulation/Gait: Patient Percentage: 60% Ambulation Distance (Feet): 4 Feet Assistive device: 2 person hand held assist Ambulation/Gait Assistance Details: lateral steps to [facility], +2 assist to maintain upright but patient able to initiate steps on his own Gait Pattern: Step-to pattern    Exercises     PT Diagnosis: Difficulty walking;Abnormality of gait;Generalized weakness;Acute pain;Altered mental status  PT Problem List: Decreased strength;Decreased range of motion;Decreased activity tolerance;Decreased balance;Decreased mobility;Decreased coordination;Decreased cognition;Decreased knowledge of use of DME;Pain PT Treatment Interventions: DME  instruction;Gait training;Functional mobility  training;Therapeutic activities;Therapeutic exercise;Balance training;Patient/family education     PT Goals(Current goals can be found in the care plan section) Acute Rehab PT Goals Patient Stated Goal: to get back to workin PT Goal Formulation: With patient Time For Goal Achievement: 03/22/13 Potential to Achieve Goals: Good  Visit Information  Last PT Received On: 03/08/13 Assistance Needed: +2 History of Present Illness: Walter Larsen is a 77 y.o. male with Past medical history of hypertension, dyslipidemia, recent admission to wake [facility] with hemorrhagic stroke. The patient is presenting with complaints of altered mental status. He was brought in by his family. As per the family since last one week he had significant headache, after which he was taken to wake [facility] for hemorrhagic stroke. From where he was discharged day before yesterday. Today he has been having hallucinations and confabulation. He was talking about basketball match but there was none. He also complained of some further frontal headache and some nausea. There were episodes of staring during which he would take a while to respond. And also he had an episode of urinary incontinence. By the EMS arrived he was found to be febrile as well.       Prior Functioning  Home Living Family/patient expects to be discharged to:: Private residence Living Arrangements: Spouse/significant other Available Help at Discharge: Family Type of Home: House Home Access: Stairs to enter Secretary/administrator of Steps: 4 Entrance Stairs-Rails: Right;Left Home Layout: One level Home Equipment: None Prior Function Level of Independence: Independent Communication Communication: No difficulties Dominant Hand: Right    Cognition  Cognition Arousal/Alertness: Lethargic Behavior During Therapy: Flat affect Overall Cognitive Status: Impaired/Different from baseline Area of Impairment: Attention;Following commands;Problem  solving Current Attention Level: Sustained Following Commands: Follows one step commands with increased time;Follows multi-step commands inconsistently Problem Solving: Slow processing;Decreased initiation;Difficulty sequencing;Requires verbal cues;Requires tactile cues    Extremity/Trunk Assessment Lower Extremity Assessment Lower Extremity Assessment: Generalized weakness;RLE deficits/detail;LLE deficits/detail RLE Deficits / Details: coordination deficits when attempting functional mobility LLE Deficits / Details: coordination deficits when attempting functional mobility   Balance Balance Balance Assessed: Yes Static Sitting Balance Static Sitting - Balance Support: Feet supported Static Sitting - Level of Assistance: 5: Stand by assistance Static Sitting - Comment/# of Minutes: 12 minutes on BSC Static Standing Balance Static Standing - Balance Support: Bilateral upper extremity supported;During functional activity Static Standing - Level of Assistance: 1: +2 Total assist Static Standing - Comment/# of Minutes: 2 minutes for hygiene purposes  End of Session PT - End of Session Equipment Utilized During Treatment: Gait belt Activity Tolerance: Patient limited by fatigue;Patient limited by lethargy Patient left: in bed;with call bell/phone within reach;with bed alarm set;with family/visitor present Nurse Communication: Mobility status  GP     Fabio Asa 03/08/2013, 10:47 AM Charlotte Crumb, PT DPT  [phone]

## 2013-03-08 NOTE — Progress Notes (Addendum)
[facility]    Subjective: Going to rest room  Antibiotics:  Anti-infectives   Start     Dose/Rate Route Frequency Ordered Stop   03/05/13 0800  vancomycin (VANCOCIN) 1,250 mg in sodium chloride 0.9 % 250 mL IVPB  Status:  Discontinued     1,250 mg 166.7 mL/hr over 90 Minutes Intravenous Every 8 hours 03/05/13 0225 03/07/13 2041   03/03/13 0300  cefTRIAXone (ROCEPHIN) 2 g in dextrose 5 % 50 mL IVPB  Status:  Discontinued     2 g 100 mL/hr over 30 Minutes Intravenous Every 12 hours 03/02/13 1453 03/07/13 2041   03/02/13 1300  cefTRIAXone (ROCEPHIN) 2 g in dextrose 5 % 50 mL IVPB  Status:  Discontinued     2 g 100 mL/hr over 30 Minutes Intravenous Every 24 hours 03/02/13 1241 03/02/13 1453   03/02/13 1300  metroNIDAZOLE (FLAGYL) IVPB 500 mg  Status:  Discontinued     500 mg 100 mL/hr over 60 Minutes Intravenous Every 6 hours 03/02/13 1241 03/07/13 0914   03/02/13 1300  vancomycin (VANCOCIN) IVPB 1000 mg/200 mL premix  Status:  Discontinued     1,000 mg 200 mL/hr over 60 Minutes Intravenous Every 12 hours 03/02/13 1252 03/05/13 0224   03/01/13 1400  cefTRIAXone (ROCEPHIN) 2 g in dextrose 5 % 50 mL IVPB  Status:  Discontinued     2 g 100 mL/hr over 30 Minutes Intravenous Every 24 hours 03/01/13 1245 03/02/13 0952   03/01/13 1200  vancomycin (VANCOCIN) IVPB 1000 mg/200 mL premix  Status:  Discontinued     1,000 mg 200 mL/hr over 60 Minutes Intravenous Every 12 hours 03/01/13 0353 03/02/13 0952   03/01/13 0600  piperacillin-tazobactam (ZOSYN) IVPB 3.375 g  Status:  Discontinued     3.375 g 12.5 mL/hr over 240 Minutes Intravenous 3 times per day 03/01/13 0353 03/01/13 1245   02/28/13 2300  piperacillin-tazobactam (ZOSYN) IVPB 3.375 g     3.375 g 100 mL/hr over 30 Minutes Intravenous  Once 02/28/13 2249 03/01/13 0007   02/28/13 2300  vancomycin (VANCOCIN) IVPB 1000 mg/200 mL premix     1,000 mg 200 mL/hr over 60 Minutes Intravenous  Once 02/28/13 2249  03/01/13 0104      Medications: Scheduled Meds: . atorvastatin  20 mg Oral QHS  . divalproex  500 mg Oral Daily  . feeding supplement (ENSURE COMPLETE)  237 mL Oral BID BM  . LORazepam  1 mg Intravenous Once  . metoprolol tartrate  50 mg Oral BID  . pantoprazole  40 mg Oral Daily   Continuous Infusions: . sodium chloride 100 mL/hr at 03/06/13 2341   PRN Meds:.acetaminophen, acetaminophen, cyclobenzaprine, hydrALAZINE, ondansetron (ZOFRAN) IV, polyvinyl alcohol, traMADol   Objective: Weight change:   Intake/Output Summary (Last 24 hours) at 03/08/13 1200 Last data filed at 03/08/13 0900  Gross per 24 hour  Intake    200 ml  Output   2225 ml  Net  -2025 ml   Blood pressure 174/84, pulse 93, temperature 100.2 F (37.9 C), temperature source Oral, resp. rate 18, height 5\' 8"  (1.727 m), weight 180 lb (81.647 kg), SpO2 95.00%. Temp:  [98 F (36.7 C)-100.2 F (37.9 C)] 100.2 F (37.9 C) (11/25 0915) Pulse Rate:  [70-93] 93 (11/25 0915) Resp:  [15-23] 18 (11/25 0915) BP: (114-174)/(48-84) 174/84 mmHg (11/25 0915) SpO2:  [93 %-98 %] 95 % (11/25 0915)  Physical Exam: General: pt alert going to RR with PT  Lab Results:  Recent Labs  03/07/13 0550 03/08/13 0610  WBC 13.0* 13.8*  HGB 12.3* 13.2  HCT 34.8* 37.5*  PLT 240 239    BMET  Recent Labs  03/07/13 0550 03/08/13 0610  NA 131* 135  K 3.5 3.5  CL 99 101  CO2 21 20  GLUCOSE 97 103*  BUN 13 19  CREATININE 0.69 1.29  CALCIUM 8.3* 8.5    Micro Results: Recent Results (from the past 240 hour(s))  CULTURE, BLOOD (ROUTINE X 2)     Status: None   Collection Time    02/28/13 11:35 PM      Result Value Range Status   Specimen Description BLOOD LEFT WRIST   Final   Special Requests BOTTLES DRAWN AEROBIC AND ANAEROBIC 10CC EACH   Final   Culture  Setup Time     Final   Value: 03/01/2013 04:45     Performed at [facility]   Culture     Final   Value: NO GROWTH 5 DAYS     Performed at [facility]   Report Status 03/07/2013 FINAL   Final  CULTURE, BLOOD (ROUTINE X 2)     Status: None   Collection Time    02/28/13 11:40 PM      Result Value Range Status   Specimen Description BLOOD LEFT HAND   Final   Special Requests BOTTLES DRAWN AEROBIC ONLY 5CC   Final   Culture  Setup Time     Final   Value: 03/01/2013 04:44     Performed at [facility]   Culture     Final   Value: NO GROWTH 5 DAYS     Performed at [facility]   Report Status 03/07/2013 FINAL   Final  URINE CULTURE     Status: None   Collection Time    03/01/13 12:16 AM      Result Value Range Status   Specimen Description URINE, CLEAN CATCH   Final   Special Requests NONE   Final   Culture  Setup Time     Final   Value: 03/01/2013 01:00     Performed at [facility] Count     Final   Value: NO GROWTH     Performed at [facility]   Culture     Final   Value: NO GROWTH     Performed at [facility]   Report Status 03/02/2013 FINAL   Final    Studies/Results: No results found.    Assessment/Plan: Walter Larsen is a 77 y.o. male with  year-old man with recent admission to [facility] with Right temporal/occipital parenchymal hematoma dc and then admitted with fevers, headaches and confusion. MRI read as c/w hematoma in Right temporo-parietal lobe. There were also small areas of acute infarct int he right occipital parietal lboe and left temproparietal lobe that made them raise the question of emboli   #1 FUO:  MRi NOT felt c/w brain abscess and TEE NOT with IE  He still DOES still have FUO which could be due to his ICH BUT we need to exclude other causes of FUO , esp intrabdominal abscess occult infection or malignancy  --I will order CT chest abdomen and pelvis and make sure he has had full FUO labs ordered. He is HIV and Hep panel negative  --Observe off antibiotics and reculture blood if fevers again     LOS: 8 days  Acey Lav 03/08/2013, 12:00 PM   ADDENDUM:  Dr. Rito Ehrlich called me to notify me of the critical finding of BILATERAL PULMONARY EMBOLI with right heart strain.  I notified Dr. Catha Gosselin, or the primary team.  Patient is in precarious situation where failure to anticoagulate will put him at risk for sudden death vs initiation of anti-coagulation may precipitate further intra-cranial bleeding.  Radiology put forth idea of IR guided thrombolytics as a possibility.   Pt is I understand to  Be transferred to the ICU.  I will defer to primary/CCM how to best handle this precarious clinical situation.  I certainly think his persisent fevers COULD be due to the ICH or the massive PE  I have written for a host of other labs. I think finding an Infection or pinning down another cause of his fevers is the least of his probems at this point  I will check in with the pt and followup his labs but will otherwise sign off for now.

## 2013-03-08 NOTE — Evaluation (Addendum)
Occupational Therapy Evaluation Patient Details Name: Walter Larsen MRN: [id number] DOB: 1935-11-29 Today's Date: 03/08/2013 Time: 4696-2952 OT Time Calculation (min): 32 min  OT Assessment / Plan / Recommendation History of present illness Walter Larsen is a 77 y.o. male with Past medical history of hypertension, dyslipidemia, recent admission to wake [facility] with hemorrhagic stroke. The patient is presenting with complaints of altered mental status. He was brought in by his family. As per the family since last one week he had significant headache, after which he was taken to wake [facility] for hemorrhagic stroke. From where he was discharged day before yesterday. Today he has been having hallucinations and confabulation. He was talking about basketball match but there was none. He also complained of some further frontal headache and some nausea. There were episodes of staring during which he would take a while to respond. And also he had an episode of urinary incontinence. By the EMS arrived he was found to be febrile as well. MRI revealed large subacute hematoma right temporoparietal lobe. Superimposed  abscess not considered likely. Small areas of acute infarct in the right occipital parietal lobe  and left temporoparietal lobe, question emboli.    Clinical Impression   Pt presents with below problem list. Pt will benefit from acute OT to increase independence prior to d/c. Recommending CIR for additional rehab prior to d/c.     OT Assessment  Patient needs continued OT Services    Follow Up Recommendations  CIR;Supervision/Assistance - 24 hour    Barriers to Discharge      Equipment Recommendations  Other (comment) (tbd)    Recommendations for Other Services    Frequency  Min 2X/week    Precautions / Restrictions Precautions Precautions: Fall Restrictions Weight Bearing Restrictions: No   Pertinent Vitals/Pain Pain in neck area-not rated. Repositioned.     ADL   Eating/Feeding: Independent Where Assessed - Eating/Feeding: Chair Grooming: Wash/dry face;Set up;Supervision/safety Where Assessed - Grooming: Unsupported sitting;Other (comment) (also supine) Upper Body Bathing: Minimal assistance Where Assessed - Upper Body Bathing: Supported sitting Lower Body Bathing: +2 Total assistance Lower Body Bathing: Patient Percentage: 20% Where Assessed - Lower Body Bathing: Supported sit to stand Upper Body Dressing: Minimal assistance Where Assessed - Upper Body Dressing: Supported sitting Lower Body Dressing: +2 Total assistance Lower Body Dressing: Patient Percentage: 10% Where Assessed - Lower Body Dressing: Supported sit to Pharmacist, hospital: +2 Total assistance Toilet Transfer: Patient Percentage: 40% (50% for sit <> stand) Toilet Transfer Method: Sit to stand;Stand pivot Acupuncturist: Materials engineer and Hygiene: +2 Total assistance Toileting - Clothing Manipulation and Hygiene: Patient Percentage: 10% Where Assessed - Toileting Clothing Manipulation and Hygiene: Sit to stand from 3-in-1 or toilet Tub/Shower Transfer Method: Not assessed Equipment Used: Gait belt Transfers/Ambulation Related to ADLs: +2 Total A (50%) for stand pivot and 40% for sit sit <> stand.  ADL Comments: Pt transferred from bed to [facility]. Pt trying to perform hygiene from front-assistance to clean buttocks.    OT Diagnosis: Generalized weakness;Acute pain;Altered mental status  OT Problem List: Decreased strength;Decreased activity tolerance;Impaired balance (sitting and/or standing);Impaired vision/perception;Decreased coordination;Decreased knowledge of use of DME or AE;Decreased knowledge of precautions;Pain OT Treatment Interventions: Self-care/ADL training;Therapeutic exercise;Neuromuscular education;DME and/or AE instruction;Therapeutic activities;Cognitive remediation/compensation;Visual/perceptual  remediation/compensation;Patient/family education;Balance training   OT Goals(Current goals can be found in the care plan section) Acute Rehab OT Goals Patient Stated Goal: to get back to workin OT Goal Formulation: With patient Time For Goal Achievement:  03/15/13 Potential to Achieve Goals: Good ADL Goals Pt Will Perform Grooming: with min guard assist;standing Pt Will Perform Lower Body Bathing: with min assist;sit to/from stand Pt Will Perform Lower Body Dressing: with min assist;sit to/from stand Pt Will Transfer to Toilet: with min guard assist;ambulating;bedside commode (sit <> stand) Pt Will Perform Toileting - Clothing Manipulation and hygiene: with min guard assist;sit to/from stand Additional ADL Goal #1: Pt will perform bed mobility at Min guard level as precursor for ADLs.   Visit Information  Last OT Received On: 03/08/13 Assistance Needed: +2 PT/OT Co-Evaluation/Treatment: Yes History of Present Illness: Walter Larsen is a 77 y.o. male with Past medical history of hypertension, dyslipidemia, recent admission to [facility] with hemorrhagic stroke. The patient is presenting with complaints of altered mental status. He was brought in by his family. As per the family since last one week he had significant headache, after which he was taken to wake [facility] for hemorrhagic stroke. From where he was discharged day before yesterday. Today he has been having hallucinations and confabulation. He was talking about basketball match but there was none. He also complained of some further frontal headache and some nausea. There were episodes of staring during which he would take a while to respond. And also he had an episode of urinary incontinence. By the EMS arrived he was found to be febrile as well.       Prior Functioning     Home Living Family/patient expects to be discharged to:: Private residence Living Arrangements: Spouse/significant other Available Help at  Discharge: Family Type of Home: House Home Access: Stairs to enter Secretary/administrator of Steps: 4 Entrance Stairs-Rails: Right;Left Home Layout: One level Home Equipment: None Prior Function Level of Independence: Independent-prior to recent CVA Communication Communication: No difficulties Dominant Hand: Right         Vision/Perception Vision - History Visual History: Other (comment) (pt had recent CVA-decreased peripheral vision in left field) Vision - Assessment Vision Assessment: Vision tested Tracking/Visual Pursuits: Other (comment) (had difficulty tracking to both right and left) Visual Fields: Left visual field deficit   Cognition  Cognition Arousal/Alertness: Lethargic Behavior During Therapy: Flat affect Overall Cognitive Status: Impaired/Different from baseline Area of Impairment: Attention;Following commands;Problem solving Current Attention Level: Sustained Following Commands: Follows one step commands with increased time;Follows multi-step commands inconsistently Problem Solving: Slow processing;Decreased initiation;Difficulty sequencing;Requires verbal cues;Requires tactile cues    Extremity/Trunk Assessment Upper Extremity Assessment Upper Extremity Assessment: RUE deficits/detail;Generalized weakness RUE Coordination: decreased fine motor Lower Extremity Assessment Lower Extremity Assessment: Defer to PT evaluation     Mobility Bed Mobility Bed Mobility: Rolling Right;Rolling Left;Left Sidelying to Sit;Sitting - Scoot to [place] of Bed;Sit to Sidelying Left Rolling Right: 3: Mod assist Rolling Left: 3: Mod assist Left Sidelying to Sit: 1: +2 Total assist;HOB flat Left Sidelying to Sit: Patient Percentage: 20% Sitting - Scoot to Edge of Bed: 4: Min assist Sit to Sidelying Left: 1: +2 Total assist Sit to Sidelying Left: Patient Percentage: 20% Details for Bed Mobility Assistance: Assist for trunk control and positioning as well as assist for hip  rotation to EOB and LEs back to bed. Patient able to initiate movement with max VCs and tactile cues but unable to carry out task despite increased time. Transfers Transfers: Sit to Stand;Stand to Sit Sit to Stand: 1: +2 Total assist;From bed;From chair/3-in-1 Sit to Stand: Patient Percentage: 40% Stand to Sit: 1: +2 Total assist;To bed;To chair/3-in-1 Stand to Sit: Patient Percentage: 40% Details for  Transfer Assistance: Max cues for upright posture and intitiation of steps, Assist to maintain upright, Bilateral LE blocking in place, poor coordination of mobility.       Exercise     Balance Balance Balance Assessed: Yes Static Sitting Balance Static Sitting - Balance Support: Feet supported Static Sitting - Level of Assistance: 5: Stand by assistance Static Sitting - Comment/# of Minutes: 12 minutes on BSC Static Standing Balance Static Standing - Balance Support: Bilateral upper extremity supported;During functional activity Static Standing - Level of Assistance: 1: +2 Total assist Static Standing - Comment/# of Minutes: hygiene   End of Session OT - End of Session Equipment Utilized During Treatment: Gait belt Activity Tolerance: Patient limited by fatigue;Patient limited by pain Patient left: in bed;with bed alarm set;with call bell/phone within reach;with family/visitor present  GO     Walter Larsen OTR/L [phone] 03/08/2013, 2:31 PM

## 2013-03-08 NOTE — Progress Notes (Signed)
Pt returned  From IR awake alert and follows command approprietly  Dressing to rt neck is clean dry and intact no tele  CMT notified . Pt denied any c/o at present. Report given to oncoming RN  Family at bed side

## 2013-03-08 NOTE — Progress Notes (Signed)
Pt c/o pressure on his bladder  Rn noted some discomfort when palpated , Bladder scanned and noted 1000 cc on sacn .I/O done and 1600 clear amber urine emptied

## 2013-03-08 NOTE — Progress Notes (Addendum)
[facility]                                                                                Patient Demographics  Walter Larsen, is a 77 y.o. male, DOB - [birth year], ZOX:[id number]  Admit date - 02/28/2013   Admitting Physician Lynden Oxford, MD  Outpatient Primary MD for the patient is Provider Not In System  LOS - 8   Chief Complaint  Patient presents with  . Altered Mental Status      Interim history This is a 77 year old past medical history of hypertension, dyslipidemia, recent admission was for showing hemorrhagic stroke. Patient was discharged with home and was found to have altered mental status with fevers. He was brought into the emergency department here at [facility] with hallucinations and confabulation. Patient continues to have fevers. Neurosurgery cells ID and neurology been consulted and following patient. Infectious disease workup including a urinalysis chest x-ray MRI of the head to rule out abscess have all been conducted with no source of infection at this time. Patient has been on broad-spectrum antibiotics for quite some time. Patient had MRI conducted showing no abscess. TEE was also conducted showing no endocarditis. At this time may consider discontinuing antibiotics and following the patient. Fevers may be secondary to ICH.  Assessment & Plan   Principal Problem:   Fever, unspecified Active Problems:   Hemorrhagic stroke   Essential hypertension, benign   Other and unspecified hyperlipidemia   Bradycardia   Hypokalemia  Fever -Unknown etiology, may be secondary to Recent Temporal/occiptal hematoma -Urine analysis negative -Urine and blood cultures shows no growth at this time.  -Patient has been on empiric antibiotic coverage with vancomycin and ceftrixone, flagyl.  Given no obvious source of infection, may consider discontinuing antibiotics and watching the patient.  Will contact ID.   -CT abdomen pending. -ID and neurosurgery consulted and  following -MRI suggests emboli -TEE showed no obvious vegetation, no thrombus, no PFO  Hypokalemia -K 3.5, will continue to monitor -Magnesium 2  Right temporal/occipital parenchymal hematoma -Neurosurgery and neurology consulted and following.   -CT Scan: Right temporal/occipital parenchymal hematoma, measuring up to 7 cm in length. Trace intraventricular extension; no hydrocephalus. -ASA held  Encephalopathy/ AMS -Resolved, no longer altered, at baseline -Neurology following  -EEG: This is a normal EEG recording during sleep. No evidence of an epileptic disorder was seen.  Bradycardia -Resovled  HTN -Continue metoprolol 50mg  BID. Continue hydralazine PRN.  Hyperlipidemia -Continue atorvastatin   Hyponatremia -Improving, Na 135, will continue IVF.  May be secondary to insensible losses.  Headaches -Continue depakote (for headache and possible seizure)  Dry Eyes Continue artificial tears.  Code Status: Full  Family Communication: Wife at bedside  Disposition Plan: Admitted.   Procedures EEG: This is a normal EEG recording during sleep. No evidence of an epileptic disorder was seen.  MRI IMPRESSION:  Large subacute hematoma right temporoparietal lobe. Superimposed abscess not considered likely.  Small areas of acute infarct in the right occipital parietal lobe and left temporoparietal lobe, question emboli.  TEE Impressions: No vegetations present.  Consults   Neurology Neurosurgery Infectious Disease Cardiology  DVT Prophylaxis  SCDs  Lab Results  Component  Value Date   PLT 239 03/08/2013    Medications  Scheduled Meds: . atorvastatin  20 mg Oral QHS  . divalproex  500 mg Oral Daily  . feeding supplement (ENSURE COMPLETE)  237 mL Oral BID BM  . LORazepam  1 mg Intravenous Once  . metoprolol tartrate  50 mg Oral BID  . pantoprazole  40 mg Oral Daily   Continuous Infusions: . sodium chloride 100 mL/hr at 03/06/13 2341   PRN  Meds:.acetaminophen, acetaminophen, cyclobenzaprine, hydrALAZINE, ondansetron (ZOFRAN) IV, polyvinyl alcohol, traMADol  Antibiotics    Anti-infectives   Start     Dose/Rate Route Frequency Ordered Stop   03/05/13 0800  vancomycin (VANCOCIN) 1,250 mg in sodium chloride 0.9 % 250 mL IVPB  Status:  Discontinued     1,250 mg 166.7 mL/hr over 90 Minutes Intravenous Every 8 hours 03/05/13 0225 03/07/13 2041   03/03/13 0300  cefTRIAXone (ROCEPHIN) 2 g in dextrose 5 % 50 mL IVPB  Status:  Discontinued     2 g 100 mL/hr over 30 Minutes Intravenous Every 12 hours 03/02/13 1453 03/07/13 2041   03/02/13 1300  cefTRIAXone (ROCEPHIN) 2 g in dextrose 5 % 50 mL IVPB  Status:  Discontinued     2 g 100 mL/hr over 30 Minutes Intravenous Every 24 hours 03/02/13 1241 03/02/13 1453   03/02/13 1300  metroNIDAZOLE (FLAGYL) IVPB 500 mg  Status:  Discontinued     500 mg 100 mL/hr over 60 Minutes Intravenous Every 6 hours 03/02/13 1241 03/07/13 0914   03/02/13 1300  vancomycin (VANCOCIN) IVPB 1000 mg/200 mL premix  Status:  Discontinued     1,000 mg 200 mL/hr over 60 Minutes Intravenous Every 12 hours 03/02/13 1252 03/05/13 0224   03/01/13 1400  cefTRIAXone (ROCEPHIN) 2 g in dextrose 5 % 50 mL IVPB  Status:  Discontinued     2 g 100 mL/hr over 30 Minutes Intravenous Every 24 hours 03/01/13 1245 03/02/13 0952   03/01/13 1200  vancomycin (VANCOCIN) IVPB 1000 mg/200 mL premix  Status:  Discontinued     1,000 mg 200 mL/hr over 60 Minutes Intravenous Every 12 hours 03/01/13 0353 03/02/13 0952   03/01/13 0600  piperacillin-tazobactam (ZOSYN) IVPB 3.375 g  Status:  Discontinued     3.375 g 12.5 mL/hr over 240 Minutes Intravenous 3 times per day 03/01/13 0353 03/01/13 1245   02/28/13 2300  piperacillin-tazobactam (ZOSYN) IVPB 3.375 g     3.375 g 100 mL/hr over 30 Minutes Intravenous  Once 02/28/13 2249 03/01/13 0007   02/28/13 2300  vancomycin (VANCOCIN) IVPB 1000 mg/200 mL premix     1,000 mg 200 mL/hr over 60  Minutes Intravenous  Once 02/28/13 2249 03/01/13 0104       Time Spent in minutes   20 minutes   Jahziel Sinn D.O. on 03/08/2013 at 8:25 AM  Between 7am to 7pm - Pager - [phone]  After 7pm go to [email or web address] - password [email or web address]  And look for the night coverage person covering for me after hours  [facility] Office  [phone]    Subjective:   Adyan Palau seen and examined today. Patient continues to complain of headache and some neck pain and fevers.  Patient also complained of dry eyes. Patient denies dizziness, chest pain, shortness of breath, abdominal pain, N/V/D/C, new weakness, numbess, tingling.    Objective:   Filed Vitals:   03/07/13 1730 03/07/13 2202 03/08/13 0130 03/08/13 0530  BP: 155/66 155/74 163/80 170/81  Pulse: 73 88 73  80  Temp: 99.5 F (37.5 C) 98 F (36.7 C) 98.4 F (36.9 C) 98.6 F (37 C)  TempSrc: Oral Axillary Oral Oral  Resp: 18 18 16 18   Height:      Weight:      SpO2: 95% 95% 96% 94%    Wt Readings from Last 3 Encounters:  03/01/13 81.647 kg (180 lb)  03/01/13 81.647 kg (180 lb)     Intake/Output Summary (Last 24 hours) at 03/08/13 0825 Last data filed at 03/08/13 0747  Gross per 24 hour  Intake      0 ml  Output   2825 ml  Net  -2825 ml    Exam  General: Well developed, well nourished, NAD, appears stated age  HEENT: NCAT, Anicteic Sclera, mucous membranes moist.   Neck: Supple, no JVD, no masses  Cardiovascular: S1 S2 auscultated, no rubs, murmurs or gallops. Regular rate and rhythm.  Respiratory: Clear to auscultation bilaterally with equal chest rise  Abdomen: Soft, nontender, nondistended, + bowel sounds  Extremities: warm dry without cyanosis clubbing or edema  Neuro: AAOx3, no neurological deficits  Skin: Without rashes exudates or nodules  Psych: Normal affect and demeanor with intact judgement and insight  Data Review   Micro Results Recent Results (from the past 240 hour(s))   CULTURE, BLOOD (ROUTINE X 2)     Status: None   Collection Time    02/28/13 11:35 PM      Result Value Range Status   Specimen Description BLOOD LEFT WRIST   Final   Special Requests BOTTLES DRAWN AEROBIC AND ANAEROBIC 10CC EACH   Final   Culture  Setup Time     Final   Value: 03/01/2013 04:45     Performed at [facility]   Culture     Final   Value: NO GROWTH 5 DAYS     Performed at [facility]   Report Status 03/07/2013 FINAL   Final  CULTURE, BLOOD (ROUTINE X 2)     Status: None   Collection Time    02/28/13 11:40 PM      Result Value Range Status   Specimen Description BLOOD LEFT HAND   Final   Special Requests BOTTLES DRAWN AEROBIC ONLY 5CC   Final   Culture  Setup Time     Final   Value: 03/01/2013 04:44     Performed at [facility]   Culture     Final   Value: NO GROWTH 5 DAYS     Performed at [facility]   Report Status 03/07/2013 FINAL   Final  URINE CULTURE     Status: None   Collection Time    03/01/13 12:16 AM      Result Value Range Status   Specimen Description URINE, CLEAN CATCH   Final   Special Requests NONE   Final   Culture  Setup Time     Final   Value: 03/01/2013 01:00     Performed at [facility] Count     Final   Value: NO GROWTH     Performed at [facility]   Culture     Final   Value: NO GROWTH     Performed at [facility]   Report Status 03/02/2013 FINAL   Final    Radiology Reports Dg Chest 2 View  03/01/2013   CLINICAL DATA:  Fever.  EXAM: CHEST - 2 VIEW  COMPARISON:  02/28/2013  FINDINGS: The heart size and mediastinal contours are within normal limits. There is no evidence of pulmonary edema, consolidation, pneumothorax, nodule or pleural fluid. Stable degenerative changes are seen in the thoracic spine  IMPRESSION: No active disease.   Electronically Signed   By: Irish Lack M.D.   On: 03/01/2013 16:35   Ct Head Wo Contrast  02/28/2013   CLINICAL DATA:   Altered mental status.  Stroke.  EXAM: CT HEAD WITHOUT CONTRAST  TECHNIQUE: Contiguous axial images were obtained from the base of the skull through the vertex without intravenous contrast.  COMPARISON:  None.  FINDINGS: Skull and Sinuses:No significant abnormality.  Orbits: Bilateral cataract resection.  Brain: Acute hemorrhage in the posterior right cerebral hemisphere, located in the posterior temporal and occipital regions. The hematoma is irregularly shaped, but the entire area of abnormality measures 7cm in length, 3 cm in width. There is a moderate amount of surrounding cerebral edema. Minimal intraventricular extension into the right lateral ventricle. No herniation, midline shift, or hydrocephalus.  Chronic small vessel ischemic white matter disease with patchy bilateral cerebral white matter low density. Extensive intracranial atherosclerotic calcification.  Critical Value/emergent results were called by telephone at the time of interpretation on 02/28/2013 at 11:13 PM to Dr.JOSHUA ZAVITZ , who verbally acknowledged these results.  IMPRESSION: 1. Right temporal/occipital parenchymal hematoma, measuring up to 7 cm in length. Trace intraventricular extension; no hydrocephalus. 2. Chronic small vessel ischemic white matter disease.   Electronically Signed   By: Tiburcio Pea M.D.   On: 02/28/2013 23:16   Dg Chest Portable 1 View  02/28/2013   CLINICAL DATA:  Altered mental status.  EXAM: PORTABLE CHEST - 1 VIEW  COMPARISON:  None.  FINDINGS: The heart size and mediastinal contours are within normal limits. Both lungs are clear. The visualized skeletal structures are unremarkable.  IMPRESSION: No active disease.   Electronically Signed   By: Tiburcio Pea M.D.   On: 02/28/2013 23:03    CBC  Recent Labs Lab 03/04/13 0345 03/05/13 0945 03/06/13 0555 03/07/13 0550 03/08/13 0610  WBC 14.2* 13.7* 16.1* 13.0* 13.8*  HGB 14.0 13.2 13.3 12.3* 13.2  HCT 40.2 37.9* 38.0* 34.8* 37.5*  PLT 196 213  238 240 239  MCV 91.8 91.5 91.3 90.4 90.8  MCH 32.0 31.9 32.0 31.9 32.0  MCHC 34.8 34.8 35.0 35.3 35.2  RDW 13.3 13.5 13.4 13.6 13.8    Chemistries   Recent Labs Lab 03/03/13 0545 03/04/13 0345 03/06/13 0555 03/07/13 0550 03/08/13 0610  NA 129* 131* 129* 131* 135  K 3.4* 3.6 3.1* 3.5 3.5  CL 97 97 97 99 101  CO2 19 23 20 21 20   GLUCOSE 109* 123* 100* 97 103*  BUN 10 10 10 13  PENDING  CREATININE 0.79 0.78 0.68 0.69 PENDING  CALCIUM 8.1* 8.6 8.3* 8.3* 8.5  MG  --   --  2.0  --   --    ------------------------------------------------------------------------------------------------------------------ CrCl cannot be calculated (Patient has no sCr result on file.). ------------------------------------------------------------------------------------------------------------------ No results found for this basename: HGBA1C,  in the last 72 hours ------------------------------------------------------------------------------------------------------------------ No results found for this basename: CHOL, HDL, LDLCALC, TRIG, CHOLHDL, LDLDIRECT,  in the last 72 hours ------------------------------------------------------------------------------------------------------------------ No results found for this basename: TSH, T4TOTAL, FREET3, T3FREE, THYROIDAB,  in the last 72 hours ------------------------------------------------------------------------------------------------------------------ No results found for this basename: VITAMINB12, FOLATE, FERRITIN, TIBC, IRON, RETICCTPCT,  in the last 72 hours  Coagulation profile No results found for this basename: INR, PROTIME,  in the last 168  hours  No results found for this basename: DDIMER,  in the last 72 hours  Cardiac Enzymes No results found for this basename: CK, CKMB, TROPONINI, MYOGLOBIN,  in the last 168 hours ------------------------------------------------------------------------------------------------------------------ No  components found with this basename: POCBNP,

## 2013-03-08 NOTE — Consult Note (Signed)
Physical Medicine and Rehabilitation Consult Reason for Consult: Large subacute hematoma right temporoparietal lobe with acute infarct right occipital parietal and left temporal parietal lobe Referring Physician: [facility] right-handed   HPI: Walter Larsen is a 77 y.o. right-handed male with history of hypertension, CAD with PTCA. Admit 03/01/2013 with altered mental status and fever. Recent admission to [facility] with hemorrhagic stroke and discharged 02/27/2013 after a three-day hospital stay. Presented with increased headache and nausea as well as hallucinations and altered mental status. Cranial CT scan showed right temporal occipital parenchymal hematoma measuring up to 7 cm in light of trace intraventricular extension without hydrocephalus. Patient was placed on vancomycin /Zosyn for fever of 100.3 white blood cell count 15,000. An MRI was completed 03/05/2013 during workup of fever rule out abscess that showed a large subacute hematoma right temporoparietal lobe as well small areas of acute infarct in the right occipital parietal lobe and left temporal parietal lobe. TEE completed showing no thrombus without PFO no vegetation. Infectious disease consult and for fever of unknown origin with workup ongoing. CT of the  03/08/2013 showed bilateral lobar and segmental pulmonary emboli. Underwent removal Greenfield filter 03/08/2012 per interventional radiology Depakote was added for headache as well as seizure prevention. Patient is on a regular consistency diet. Physical therapy evaluation completed 03/08/2013 with recommendations of physical medicine rehabilitation consult to consider inpatient rehabilitation services.  Patient has poor appetite, food this and takes good but denies any choking Very active prior to admission    Review of Systems  Gastrointestinal: Positive for nausea.  Neurological: Positive for headaches.  Psychiatric/Behavioral: Positive for memory loss.  All other systems  reviewed and are negative.   Past Medical History  Diagnosis Date  . Stroke   . Coronary artery disease     3 stents   Past Surgical History  Procedure Laterality Date  . Tee without cardioversion N/A 03/07/2013    Procedure: TRANSESOPHAGEAL ECHOCARDIOGRAM (TEE);  Surgeon: Pricilla Riffle, MD;  Location: [facility] ENDOSCOPY;  Service: Cardiovascular;  Laterality: N/A;   Family History  Problem Relation Age of Onset  . Hypertension Mother   . Hypertension Father    Social History:  reports that he has never smoked. He does not have any smokeless tobacco history on file. He reports that he does not drink alcohol or use illicit drugs. Allergies:  Allergies  Allergen Reactions  . Ativan [Lorazepam]     confusion   Medications Prior to Admission  Medication Sig Dispense Refill  . atorvastatin (LIPITOR) 20 MG tablet Take 20 mg by mouth at bedtime.      Marland Kitchen esomeprazole (NEXIUM) 40 MG capsule Take 40 mg by mouth daily before breakfast.      . fenofibrate (TRICOR) 145 MG tablet Take 145 mg by mouth at bedtime.      . Glucosamine-Chondroitin (OSTEO BI-FLEX REGULAR STRENGTH PO) Take 1 tablet by mouth daily with breakfast.      . metoprolol (LOPRESSOR) 50 MG tablet Take 50 mg by mouth daily with lunch.      . Multiple Vitamin (MULTIVITAMIN WITH MINERALS) TABS tablet Take 1 tablet by mouth daily with breakfast.      . Omega-3 Fatty Acids (FISH OIL PO) Take 1 capsule by mouth daily with breakfast.      . [DISCONTINUED] aspirin 325 MG tablet Take 325 mg by mouth daily with lunch.        Home: Home Living Family/patient expects to be discharged to:: Private residence Living Arrangements: Spouse/significant other  Available Help at Discharge: Family Type of Home: House Home Access: Stairs to enter [employer] of Steps: 4 Entrance Stairs-Rails: Right;Left Home Layout: One level Home Equipment: None  Functional History:   Functional Status:  Mobility: Bed Mobility Bed Mobility:  Rolling Right;Rolling Left;Left Sidelying to Sit;Sitting - Scoot to [employer] of Bed;Sit to Sidelying Left Rolling Right: 3: Mod assist Rolling Left: 3: Mod assist Left Sidelying to Sit: 1: +2 Total assist;HOB flat Left Sidelying to Sit: Patient Percentage: 20% Sitting - Scoot to Edge of Bed: 4: Min assist Sit to Sidelying Left: 1: +2 Total assist Sit to Sidelying Left: Patient Percentage: 20% Transfers Transfers: Sit to Stand;Stand to Sit;Stand Pivot Transfers Sit to Stand: 1: +2 Total assist;From bed;From chair/3-in-1 Sit to Stand: Patient Percentage: 40% Stand to Sit: 1: +2 Total assist;To bed;To chair/3-in-1 Stand to Sit: Patient Percentage: 40% Stand Pivot Transfers: 1: +2 Total assist Stand Pivot Transfers: Patient Percentage: 50% Ambulation/Gait Ambulation/Gait Assistance: 1: +2 Total assist Ambulation/Gait: Patient Percentage: 60% Ambulation Distance (Feet): 4 Feet Assistive device: 2 person hand held assist Ambulation/Gait Assistance Details: lateral steps to HOB, +2 assist to maintain upright but patient able to initiate steps on his own Gait Pattern: Step-to pattern    ADL:    Cognition: Cognition Overall Cognitive Status: Impaired/Different from baseline Orientation Level: Oriented to person;Oriented to place;Oriented to time Cognition Arousal/Alertness: Lethargic Behavior During Therapy: Flat affect Overall Cognitive Status: Impaired/Different from baseline Area of Impairment: Attention;Following commands;Problem solving Current Attention Level: Sustained Following Commands: Follows one step commands with increased time;Follows multi-step commands inconsistently Problem Solving: Slow processing;Decreased initiation;Difficulty sequencing;Requires verbal cues;Requires tactile cues  Blood pressure 174/84, pulse 93, temperature 100.2 F (37.9 C), temperature source Oral, resp. rate 18, height 5\' 8"  (1.727 m), weight 81.647 kg (180 lb), SpO2 95.00%. Physical Exam   Vitals reviewed. HENT:  Head: Normocephalic.  Eyes: EOM are normal.  No nystagmus  Neck: Normal range of motion. Neck supple. No thyromegaly present.  Cardiovascular: Regular rhythm.   Respiratory: Effort normal and breath sounds normal. No respiratory distress.  GI: Soft. Bowel sounds are normal. He exhibits no distension.  Neurological: He is alert.  Mood is flat but appropriate. He makes good eye contact with examiner. He was able to provide his name being some cues for his age. He did follow simple commands  Skin: Skin is warm and dry.   motor strength: 5/5 in the right deltoid, bicep, tricep, grip, hip flexor, knee extensors, ankle dorsiflexor plantar flexor 4/5 in the left deltoid, bicep, tricep, grip, hip flexor, knee extensors, ankle dorsi flexion plantar flexor Sensation intact to light touch Cerebellar shows mild dysmetria finger-nose-finger testing on the left upper extremity only Visual fields show left field cut on confrontational testing  Results for orders placed during the hospital encounter of 02/28/13 (from the past 24 hour(s))  VANCOMYCIN, TROUGH     Status: None   Collection Time    03/07/13  3:35 PM      Result Value Range   Vancomycin Tr 18.8  10.0 - 20.0 ug/mL  GLUCOSE, CAPILLARY     Status: Abnormal   Collection Time    03/07/13  8:07 PM      Result Value Range   Glucose-Capillary 133 (*) 70 - 99 mg/dL   Comment 1 Notify RN     Comment 2 Documented in Chart    GLUCOSE, CAPILLARY     Status: Abnormal   Collection Time    03/07/13 11:44 PM  Result Value Range   Glucose-Capillary 107 (*) 70 - 99 mg/dL   Comment 1 Documented in Chart     Comment 2 Notify RN    GLUCOSE, CAPILLARY     Status: None   Collection Time    03/08/13  3:54 AM      Result Value Range   Glucose-Capillary 96  70 - 99 mg/dL  CBC     Status: Abnormal   Collection Time    03/08/13  6:10 AM      Result Value Range   WBC 13.8 (*) 4.0 - 10.5 K/uL   RBC 4.13 (*) 4.22 - 5.81  MIL/uL   Hemoglobin 13.2  13.0 - 17.0 g/dL   HCT 96.0 (*) 45.4 - 09.8 %   MCV 90.8  78.0 - 100.0 fL   MCH 32.0  26.0 - 34.0 pg   MCHC 35.2  30.0 - 36.0 g/dL   RDW 11.9  14.7 - 82.9 %   Platelets 239  150 - 400 K/uL  BASIC METABOLIC PANEL     Status: Abnormal   Collection Time    03/08/13  6:10 AM      Result Value Range   Sodium 135  135 - 145 mEq/L   Potassium 3.5  3.5 - 5.1 mEq/L   Chloride 101  96 - 112 mEq/L   CO2 20  19 - 32 mEq/L   Glucose, Bld 103 (*) 70 - 99 mg/dL   BUN 19  6 - 23 mg/dL   Creatinine, Ser 5.62  0.50 - 1.35 mg/dL   Calcium 8.5  8.4 - 13.0 mg/dL   GFR calc non Af Amer 56 (*) >90 mL/min   GFR calc Af Amer 64 (*) >90 mL/min   No results found.  Assessment/Plan: Diagnosis: Right temporoparietal intracranial hemorrhage as well as right occipital infarct with left hemiparesis and left homonymous hemianopsia 1. Does the need for close, 24 hr/day medical supervision in concert with the patient's rehab needs make it unreasonable for this patient to be served in a less intensive setting? Yes 2. Co-Morbidities requiring supervision/potential complications: History of recent pulmonary embolus, hypertension 3. Due to bladder management, bowel management, safety, skin/wound care, disease management, medication administration, pain management and patient education, does the patient require 24 hr/day rehab nursing? Yes 4. Does the patient require coordinated care of a physician, rehab nurse, PT (1-2 hrs/day, 5 days/week), OT (1-2 hrs/day, 5 days/week) and SLP (0.5-1 hrs/day, 5 days/week) to address physical and functional deficits in the context of the above medical diagnosis(es)? Yes Addressing deficits in the following areas: balance, endurance, locomotion, strength, transferring, bowel/bladder control, bathing, dressing, feeding, grooming, toileting, cognition, speech, language and swallowing 5. Can the patient actively participate in an intensive therapy program of at least  3 hrs of therapy per day at least 5 days per week? Yes 6. The potential for patient to make measurable gains while on inpatient rehab is excellent 7. Anticipated functional outcomes upon discharge from inpatient rehab are supervision mobility with PT, supervision ADLs with OT, safe per os intake, improve speed of processing with SLP. 8. Estimated rehab length of stay to reach the above functional goals is: 10-14 days 9. Does the patient have adequate social supports to accommodate these discharge functional goals? Potentially 10. Anticipated D/C setting: Home 11. Anticipated post D/C treatments: HH therapy 12. Overall Rehab/Functional Prognosis: excellent  RECOMMENDATIONS: This patient's condition is appropriate for continued rehabilitative care in the following setting: CIR Patient has agreed to participate  in recommended program. Yes Note that insurance prior authorization may be required for reimbursement for recommended care.  Comment: Rehabilitation RN to followup on progress    03/08/2013

## 2013-03-08 NOTE — Consult Note (Addendum)
PULMONARY  / CRITICAL CARE MEDICINE  Name: Walter Larsen MRN: [id number] DOB: 27-May-1935    ADMISSION DATE:  02/28/2013 CONSULTATION DATE:  03/08/2013  REFERRING MD :  Dr. Catha Gosselin PRIMARY SERVICE: IM  CHIEF COMPLAINT:  Fever and Hypertension s/p CVA with hemorrhagic transformation  BRIEF PATIENT DESCRIPTION:   Walter Larsen is a 77 year old Caucasian male with HTN, CAD, Chronic Back Pain, BPH and a recent CVA diagnosed on 11/13 at [facility].  He was transferred to [facility] after the CVA was diagnosed and at [facility] found to have hemorrhagic transformation.  He has residual left vision problem after the stroke.  He was discharged from [facility] on 02/27/2013.  On Monday 11/17, he came to [facility] ED for further evaluation of fever of 103 and hypertension.  To date no source of infection was found to explain his fever.  CT chest and abdomen to evaluate fever reveal moderate burden PE bilaterally with right heart strained on CT.  PCCM is consulted for further input into the patient care.  Upon chart reviewed, neurosurgery is following patient agree that patient cannot be anticoagulated for his PE given hematoma found on CT scan on admission.  SIGNIFICANT EVENTS / STUDIES:  11/17 CT head with right temporal/occipital parenchymal hematoma 11/25 CT chest/abdomen/pelvis with  Bilateral lobar and segmental PE, moderate clot burden  Right heart strain  Small left and trace right pleural effusions  LINES / TUBES: Peripheral IV 11/25 >>> Condom catheter 11/25 >>> 11/25 Foley Catheter 11/25 >>>  CULTURES: Blood culture 11/17 >>> No Growth Urine culture 11/18 >>> Negative to date Hepatitis 11/20 >>> Negative HIV 11/20 >>> Negative Urine culture 11/25 >>>  ANTIBIOTICS: None Currently  HISTORY OF PRESENT ILLNESS:   Walter Larsen is a 77 year old Caucasian male with HTN, CAD, Chronic Back Pain, BPH and a recent CVA diagnosed on 11/13 at [facility].  He was transferred to [facility]  after the CVA was diagnosed and at [facility] found to have hemorrhagic transformation.  He has residual left vision problem after the stroke.  He was discharged from [facility] on 02/27/2013.  On Monday 11/17, he came to [facility] ED for further evaluation of fever of 103 and hypertension.  To date no source of infection was found to explain his fever.  CT chest and abdomen to evaluate fever reveal moderate burden PE bilaterally with right heart strained on CT.  PCCM is consulted for further input into the patient care.  Upon chart reviewed, neurosurgery is following patient agree that patient cannot be anticoagulated for his PE given hematoma found on CT scan on admission.  PAST MEDICAL HISTORY :  Past Medical History  Diagnosis Date  . Stroke   . Coronary artery disease     3 stents   Past Surgical History  Procedure Laterality Date  . Tee without cardioversion N/A 03/07/2013    Procedure: TRANSESOPHAGEAL ECHOCARDIOGRAM (TEE);  Surgeon: Pricilla Riffle, MD;  Location: [facility] ENDOSCOPY;  Service: Cardiovascular;  Laterality: N/A;   Prior to Admission medications   Medication Sig Start Date End Date Taking? Authorizing Provider  atorvastatin (LIPITOR) 20 MG tablet Take 20 mg by mouth at bedtime.   Yes Historical Provider, MD  esomeprazole (NEXIUM) 40 MG capsule Take 40 mg by mouth daily before breakfast.   Yes Historical Provider, MD  fenofibrate (TRICOR) 145 MG tablet Take 145 mg by mouth at bedtime.   Yes Historical Provider, MD  Glucosamine-Chondroitin (OSTEO BI-FLEX REGULAR STRENGTH PO)  Take 1 tablet by mouth daily with breakfast.   Yes Historical Provider, MD  metoprolol (LOPRESSOR) 50 MG tablet Take 50 mg by mouth daily with lunch.   Yes Historical Provider, MD  Multiple Vitamin (MULTIVITAMIN WITH MINERALS) TABS tablet Take 1 tablet by mouth daily with breakfast.   Yes Historical Provider, MD  Omega-3 Fatty Acids (FISH OIL PO) Take 1 capsule by mouth daily with breakfast.   Yes Historical  Provider, MD   Allergies  Allergen Reactions  . Ativan [Lorazepam]     confusion    FAMILY HISTORY:  Family History  Problem Relation Age of Onset  . Hypertension Mother   . Hypertension Father    SOCIAL HISTORY:  reports that he has never smoked. He does not have any smokeless tobacco history on file. He reports that he does not drink alcohol or use illicit drugs.  REVIEW OF SYSTEMS:   HEENT:  Left eye with funny vision problem remains, no other complained Heart:  Denied chest pain, sob, PND, Orthorpnea, dyspnea Lung:  Denied wheezed, dib, cough, sputum Abdomen:  Tenderness in the pelvic area, no flank pain, have pain when pee GU:  Urinary retention, hurt when pee Neuro:  Cannot ambulate by self during this hospitalization, no other problem  SUBJECTIVE:   VITAL SIGNS: Temp:  [97.8 F (36.6 C)-100.2 F (37.9 C)] 97.8 F (36.6 C) (11/25 1434) Pulse Rate:  [73-106] 106 (11/25 1434) Resp:  [16-20] 20 (11/25 1434) BP: (112-174)/(66-84) 112/76 mmHg (11/25 1434) SpO2:  [94 %-96 %] 96 % (11/25 1434) HEMODYNAMICS:   VENTILATOR SETTINGS:   INTAKE / OUTPUT: Intake/Output     11/24 0701 - 11/25 0700 11/25 0701 - 11/26 0700   P.O.  440   IV Piggyback     Total Intake(mL/kg)  440 (5.4)   Urine (mL/kg/hr) 3400 (1.7) 2425 (2.8)   Total Output 3400 2425   Net -3400 -1985        Urine Occurrence 1 x    Stool Occurrence 1 x      PHYSICAL EXAMINATION: General:  Well nourished gentleman lying in bed in no apparent respiratory distress, look a little depress Neuro:  AAOx3, able to provide history, no weakness, normal DTFs HEENT:  White hair, NCAT, no lesion, no tenderenss  Left eyelid droopier than right, cannot see clearly out of left eye as compare to right, EOMI, PERRLA  Intact ear, no discharge, no apparent hearing loss  No congestion, no nasal discharge  Dry mucosal, no oral lesion, no thrush Cardiovascular:  RRR, no murmur/rub/gallop, s1 and s2 head, no s3 nor s4  appreciated Lungs:  CTAB, no adventitious soudns Abdomen:  Distended, soft, tympanic to percussion, tenderness in pelvic region with tapping, no cva, (+)BS Musculoskeletal:  No weakness, normal DTF, patient no stable to walk on his own, no edema, scds bilaterally Skin:  Warm and moist to touch, no lesions, no ulcers  LABS:  CBC  Recent Labs Lab 03/06/13 0555 03/07/13 0550 03/08/13 0610  WBC 16.1* 13.0* 13.8*  HGB 13.3 12.3* 13.2  HCT 38.0* 34.8* 37.5*  PLT 238 240 239   Coag's No results found for this basename: APTT, INR,  in the last 168 hours BMET  Recent Labs Lab 03/06/13 0555 03/07/13 0550 03/08/13 0610  NA 129* 131* 135  K 3.1* 3.5 3.5  CL 97 99 101  CO2 20 21 20   BUN 10 13 19   CREATININE 0.68 0.69 1.29  GLUCOSE 100* 97 103*   Electrolytes  Recent  Labs Lab 03/06/13 0555 03/07/13 0550 03/08/13 0610  CALCIUM 8.3* 8.3* 8.5  MG 2.0  --   --   PHOS 2.1*  --   --    Sepsis Markers No results found for this basename: LATICACIDVEN, PROCALCITON, O2SATVEN,  in the last 168 hours ABG No results found for this basename: PHART, PCO2ART, PO2ART,  in the last 168 hours Liver Enzymes No results found for this basename: AST, ALT, ALKPHOS, BILITOT, ALBUMIN,  in the last 168 hours Cardiac Enzymes No results found for this basename: TROPONINI, PROBNP,  in the last 168 hours Glucose  Recent Labs Lab 03/07/13 1100 03/07/13 2007 03/07/13 2344 03/08/13 0354  GLUCAP 88 133* 107* 96    Imaging Ct Chest W Contrast  03/08/2013   CLINICAL DATA:  Fever of unknown origin  EXAM: CT CHEST, ABDOMEN, AND PELVIS WITH CONTRAST  TECHNIQUE: Multidetector CT imaging of the chest, abdomen and pelvis was performed following the standard protocol during bolus administration of intravenous contrast.  CONTRAST:  90mL OMNIPAQUE IOHEXOL 300 MG/ML  SOLN  COMPARISON:  None.  FINDINGS: CT CHEST FINDINGS  Although not performed for evaluation of the pulmonary arteries, there are bilateral  lobar and segmental pulmonary emboli to all lobes. Overall clot burden is moderate.  Associated findings suggesting right heart strain, including straightening of the interventricular septum, an elevated RV to LV ratio, and leftward bowing of the inter-atrial septum.  Evaluation of the lung parenchyma is mildly constrained by respiratory motion.  Mild dependent atelectasis in the bilateral lower lobes. Small left and trace right pleural effusions.  The visualized thyroid is unremarkable.  The heart is top normal in size. Coronary atherosclerosis. Atherosclerotic calcifications of the aortic arch.  Small mediastinal lymph nodes which do not meet pathologic CT size criteria. No suspicious hilar or axillary lymphadenopathy.  Degenerative changes of the thoracic spine.  CT ABDOMEN AND PELVIS FINDINGS  Motion degraded images.  Liver, spleen, pancreas, and adrenal glands are within normal limits.  Gallbladder is underdistended. No intrahepatic or extrahepatic ductal dilatation.  Two interpolar right renal cyst measuring 11 mm (series 7/ image 18) and 15 mm (series 7/ image 21). Scattered small left renal cysts measuring up to 10 mm (series 7/ image 20). No hydronephrosis.  No evidence of bowel obstruction. No convincing bowel wall thickening or inflammatory changes.  Atherosclerotic calcifications of the abdominal aorta and branch vessels.  No abdominopelvic ascites.  No suspicious abdominopelvic lymphadenopathy.  Prostate is notable for enlargement of the central gland which indents the base of the bladder.  Bladder is within normal limits.  Degenerative changes of the lumbar spine.  IMPRESSION: Bilateral lobar and segmental pulmonary emboli, as described above. Overall clot burden is moderate.  Secondary findings worrisome for right heart strain.  Small left and trace right pleural effusions.  Critical value/emergent results were called by telephone at the time of interpretation on 03/08/2013 at 4:27 PM to Dr.CORNELIUS  VAN DAM , who verbally acknowledged these results.   Electronically Signed   By: Charline Bills M.D.   On: 03/08/2013 16:43   Ct Abdomen Pelvis W Contrast  03/08/2013   CLINICAL DATA:  Fever of unknown origin  EXAM: CT CHEST, ABDOMEN, AND PELVIS WITH CONTRAST  TECHNIQUE: Multidetector CT imaging of the chest, abdomen and pelvis was performed following the standard protocol during bolus administration of intravenous contrast.  CONTRAST:  90mL OMNIPAQUE IOHEXOL 300 MG/ML  SOLN  COMPARISON:  None.  FINDINGS: CT CHEST FINDINGS  Although not performed for  evaluation of the pulmonary arteries, there are bilateral lobar and segmental pulmonary emboli to all lobes. Overall clot burden is moderate.  Associated findings suggesting right heart strain, including straightening of the interventricular septum, an elevated RV to LV ratio, and leftward bowing of the inter-atrial septum.  Evaluation of the lung parenchyma is mildly constrained by respiratory motion.  Mild dependent atelectasis in the bilateral lower lobes. Small left and trace right pleural effusions.  The visualized thyroid is unremarkable.  The heart is top normal in size. Coronary atherosclerosis. Atherosclerotic calcifications of the aortic arch.  Small mediastinal lymph nodes which do not meet pathologic CT size criteria. No suspicious hilar or axillary lymphadenopathy.  Degenerative changes of the thoracic spine.  CT ABDOMEN AND PELVIS FINDINGS  Motion degraded images.  Liver, spleen, pancreas, and adrenal glands are within normal limits.  Gallbladder is underdistended. No intrahepatic or extrahepatic ductal dilatation.  Two interpolar right renal cyst measuring 11 mm (series 7/ image 18) and 15 mm (series 7/ image 21). Scattered small left renal cysts measuring up to 10 mm (series 7/ image 20). No hydronephrosis.  No evidence of bowel obstruction. No convincing bowel wall thickening or inflammatory changes.  Atherosclerotic calcifications of the  abdominal aorta and branch vessels.  No abdominopelvic ascites.  No suspicious abdominopelvic lymphadenopathy.  Prostate is notable for enlargement of the central gland which indents the base of the bladder.  Bladder is within normal limits.  Degenerative changes of the lumbar spine.  IMPRESSION: Bilateral lobar and segmental pulmonary emboli, as described above. Overall clot burden is moderate.  Secondary findings worrisome for right heart strain.  Small left and trace right pleural effusions.  Critical value/emergent results were called by telephone at the time of interpretation on 03/08/2013 at 4:27 PM to Dr.CORNELIUS VAN DAM , who verbally acknowledged these results.   Electronically Signed   By: Charline Bills M.D.   On: 03/08/2013 16:43     CXR:  NONE  ASSESSMENT / PLAN:  PULMONARY A: Bilateral Moderated Burden PE on CTA (hemodynamically stable), limited symptoms  P:   - No anticoagulation nor antiplatelet given patient recent hemorrhagic stroke on 11/13, d/w neuro - Neurosurgery is following patient and in agreement - Greenfield's filter, removable in case we can consider coumadin as outpt in 6-8 weeks? - No respiratory distress or failure currently - No ICU transfer need - Keep on cardiac monitoring - BLE dopplers order - d/c SCDs until DVT is ruled out -likely pe occurred subacute, days to weeks  CARDIOVASCULAR A:  CAD s/p Stent x3 12 years ago  P:  - Statin, BB on board - Per primary team Consider tte re eval for rv fxn  RENAL A:   Acute Kidney Injury, atn? Contrast? R/o obstuction BPH  P:   - Patient came with normal renal function - Had contrast for CT evaluation - AKI likely due to contrast induced - NS is running at 100/hour - Monitor BMET - Patient indicated BPH from out patient - Currently has urinary retention - Foley inserted  GASTROINTESTINAL A:   No current Problem  P:   - Protonix  HEMATOLOGIC A:   Leukocytosis  P:  - Unclear  etiology - ID is following and working up patient for source - Redo urine culture given pain/burning with urination and retention - Monitor CBC -strong family h/o dvt,pe, including young Information systems manager, conisder further work up, although his neuro injury is likely main issue  INFECTIOUS A:    Leukocytosis Fever  P:   - Pan culture negative for source today - No apparent abscess found on imaging studies - Possibly reactive to CVA, questionable brains abscess due to CNS bleed - ID is following - Monitoring off of antibiotic for now  ENDOCRINE A:    No Current nor Documented Problem    P:   - No intervention  NEUROLOGIC A:    Temporal/Occipital CVA with Hemorrhagic Transformation  --> No residual weakness  --> Left vision problem due to stroke P:   - Neurosurgery is following and no intervention at this time - No antiplatelet, No anticoagulation due to hemorrhagic CVA   TODAY'S SUMMARY:  Bilateral moderated PE on CT studies.  Neurosurgery is in agreement of no anticoagulation nor antiplatelet.  Will place removable Greenfields' filter.  Place Foley.  Doppler legs    I have personally obtained a history, examined the patient, evaluated laboratory and imaging results, formulated the assessment and plan and placed orders.   Mcarthur Rossetti. Tyson Alias, MD, FACP Pgr: [phone] [place] [facility]  Pulmonary and Critical Care Medicine [facility] Pager: [phone]  03/08/2013, 5:28 PM

## 2013-03-08 NOTE — Consult Note (Signed)
Stroke Team Progress Note  HISTORY  Walter Larsen is a 77 y.o. male with Past medical history of hypertension, dyslipidemia, [facility] with hemorrhagic stroke. The patient is admitted for altered mental status, hallucinations and confabulation. Patient also had some frontal headache, some nausea and an episode of urinary incontinence. Patient was found to have a fever. Blood cultures no growth so far. ID is on board, Dr. Zenaida Niece dam evaluated the patient. Patient is currently treated with antibiotics, including vancomycin and ceftriaxone IV. MRI on 11/22 showed large subacute hematoma right temporoparietal lobe. There are small areas of acute infarct in the right occipital parietal lobe and left temporoparietal lobe, question emboli per radiologist. Stroke team was consulted.   Patient was not a TPA candidate secondary to intracranial hemorrhage.   SUBJECTIVE   Overall he feels his condition is unchanged. Patient had Tm of 101.1. He doesn't have chest pain, shortness of breath, weakness in extremities. His TEE is negative.  OBJECTIVE Most recent Vital Signs: Filed Vitals:   03/07/13 2202 03/08/13 0130 03/08/13 0530 03/08/13 0915  BP: 155/74 163/80 170/81 174/84  Pulse: 88 73 80 93  Temp: 98 F (36.7 C) 98.4 F (36.9 C) 98.6 F (37 C) 100.2 F (37.9 C)  TempSrc: Axillary Oral Oral Oral  Resp: 18 16 18 18   Height:      Weight:      SpO2: 95% 96% 94% 95%   CBG (last 3)   Recent Labs  03/07/13 2007 03/07/13 2344 03/08/13 0354  GLUCAP 133* 107* 96    IV Fluid Intake:   . sodium chloride 100 mL/hr at 03/06/13 2341    MEDICATIONS  . atorvastatin  20 mg Oral QHS  . divalproex  500 mg Oral Daily  . feeding supplement (ENSURE COMPLETE)  237 mL Oral BID BM  . LORazepam  1 mg Intravenous Once  . metoprolol tartrate  50 mg Oral BID  . pantoprazole  40 mg Oral Daily   PRN:  acetaminophen, acetaminophen, cyclobenzaprine, hydrALAZINE, ondansetron (ZOFRAN) IV,  polyvinyl alcohol, traMADol  Diet:  Cardiac Activity:  Bedrest DVT Prophylaxis:  SCD  CLINICALLY SIGNIFICANT STUDIES Basic Metabolic Panel:   Recent Labs Lab 03/04/13 0345 03/06/13 0555 03/07/13 0550 03/08/13 0610  NA 131* 129* 131* 135  K 3.6 3.1* 3.5 3.5  CL 97 97 99 101  CO2 23 20 21 20   GLUCOSE 123* 100* 97 103*  BUN 10 10 13 19   CREATININE 0.78 0.68 0.69 1.29  CALCIUM 8.6 8.3* 8.3* 8.5  MG  --  2.0  --   --   PHOS  --  2.1*  --   --    Liver Function Tests: No results found for this basename: AST, ALT, ALKPHOS, BILITOT, PROT, ALBUMIN,  in the last 168 hours CBC:   Recent Labs Lab 03/07/13 0550 03/08/13 0610  WBC 13.0* 13.8*  HGB 12.3* 13.2  HCT 34.8* 37.5*  MCV 90.4 90.8  PLT 240 239   Coagulation: No results found for this basename: LABPROT, INR,  in the last 168 hours Cardiac Enzymes: No results found for this basename: CKTOTAL, CKMB, CKMBINDEX, TROPONINI,  in the last 168 hours Urinalysis: No results found for this basename: COLORURINE, APPERANCEUR, LABSPEC, PHURINE, GLUCOSEU, HGBUR, BILIRUBINUR, KETONESUR, PROTEINUR, UROBILINOGEN, NITRITE, LEUKOCYTESUR,  in the last 168 hours Lipid Panel No results found for this basename: chol,  trig,  hdl,  cholhdl,  vldl,  ldlcalc   HgbA1C  No results found for this basename:  HGBA1C    Urine Drug Screen:   No results found for this basename: labopia,  cocainscrnur,  labbenz,  amphetmu,  thcu,  labbarb    Alcohol Level: No results found for this basename: ETH,  in the last 168 hours  No results found. CXR: negaitve   EEG: normal. Therapy Recommendations: start the Depakote for headache and for seizure prevention.   Physical Exam:   Filed Vitals:   03/07/13 2202 03/08/13 0130 03/08/13 0530 03/08/13 0915  BP: 155/74 163/80 170/81 174/84  Pulse: 88 73 80 93  Temp: 98 F (36.7 C) 98.4 F (36.9 C) 98.6 F (37 C) 100.2 F (37.9 C)  TempSrc: Axillary Oral Oral Oral  Resp: 18 16 18 18   Height:      Weight:       SpO2: 95% 96% 94% 95%    General: Not in acute distress HEENT: PERRL, EOMI, no scleral icterus, No JVD or bruit Cardiac: S1/S2, RRR, No murmurs, gallops or rubs Pulm: Good air movement bilaterally. Clear to auscultation bilaterally. No rales, wheezing, rhonchi or rubs. Abd: Soft, nondistended, nontender, no rebound pain, no organomegaly, BS present Ext: No edema. 2+DP/PT pulse bilaterally Musculoskeletal: No joint deformities, erythema, or stiffness, ROM full Skin: No rashes.  Neuro: Alert and oriented X3, cranial nerves II-XII grossly intact, muscle strength 5/5 in all extremeties, sensation to light touch intact. Brachial reflex 2+ bilaterally. Knee reflex 1+ bilaterally. Negative Babinski's sign. Normal finger to nose test. Psych: Patient is not psychotic, no suicidal or hemocidal ideation.  Neurologic Examination:                                                                                                      Mental Status: Alert, oriented, thought content appropriate.  Speech fluent without evidence of aphasia.  Able to follow 3 step commands without difficulty.    Cranial Nerves: II: Visual fields (+) left hemianopsia, pupils equal, round, reactive to light and accommodation III,IV, VI: ptosis not present, extra-ocular motions intact bilaterally V,VII: smile symmetric, facial light touch sensation normal bilaterally VIII: hearing normal bilaterally IX,X: gag reflex present XI: bilateral shoulder shrug XII: midline tongue extension without atrophy or fasciculations  Motor: Right :  Upper extremity   5/5                                       Left:     Upper extremity   5/5             Lower extremity   5/5                                                  Lower extremity   5/5 Tone and bulk:normal tone throughout; no atrophy noted Sensory: Pinprick and light touch intact throughout, bilaterally Deep Tendon Reflexes:   Right: Upper Extremity  Left:  Upper extremity   biceps (C-5 to C-6) 2/4                       biceps (C-5 to C-6) 2/4 Brachioradialis (C6) 2/4                      Brachioradialis (C6) 2/4  Lower Extremity Lower Extremity   quadriceps (L-2 to L-4) 2/4                 quadriceps (L-2 to L-4) 2/4 Achilles (S1) 2/4                                  Achilles (S1) 2/4  Plantars: Right: downgoing                                Left: downgoing Cerebellar: normal finger-to-nose,  normal heel-to-shin test Gait: not tested CV: pulses palpable throughout     ASSESSMENT Walter Larsen is a 77 y.o. male presenting with AMS and fever. MRI on 11/22 showed large subacute hematoma right temporoparietal lobe. There are small areas of acute infarct in the right occipital parietal lobe and left temporoparietal lobe, question emboli per radiologist. Stroke team was consulted. Dr. Pearlean Brownie reviewed the image and thinks that small areas of right occipital and parietal lobe change are likely diffusion abnormalities from subacute blood products due to previous hemorrhage and less likely due to new stroke. Patient was not a TPA candidate secondary to intracranial hemorrhage. On aspirin 325 mg orally every day prior to admission. Now ASA on hold.  Patient with resultant left hemianopsia.   MRI:  large subacute hematoma right temporoparietal lobe. There are small areas of acute infarct in the right occipital parietal lobe and left temporoparietal lobe, question emboli per radiologist CXR: negaitve   EEG: normal.  TEE: negative  Therapy Recommendations:  Depakote for headache and for seizure prevention. Hospital day # 8  TREATMENT/PLAN  Continue to hold ASA   Continue Depakote 500 mg oral once a day.  Neurology will sing off today.   Lorretta Harp, MD PGY3, Internal Medicine Teaching Service Pager: [phone]   I have personally obtained a history, examined the patient, evaluated imaging results, and formulated the assessment and plan of  care. I agree with the above.  Delia Heady, MD

## 2013-03-09 DIAGNOSIS — I2699 Other pulmonary embolism without acute cor pulmonale: Secondary | ICD-10-CM

## 2013-03-09 DIAGNOSIS — I6340 Cerebral infarction due to embolism of unspecified cerebral artery: Secondary | ICD-10-CM

## 2013-03-09 LAB — COMPREHENSIVE METABOLIC PANEL
AST: 39 U/L — ABNORMAL HIGH (ref 0–37)
Albumin: 2.3 g/dL — ABNORMAL LOW (ref 3.5–5.2)
BUN: 16 mg/dL (ref 6–23)
CO2: 22 mEq/L (ref 19–32)
Calcium: 8.7 mg/dL (ref 8.4–10.5)
Chloride: 101 mEq/L (ref 96–112)
Creatinine, Ser: 0.76 mg/dL (ref 0.50–1.35)
GFR calc non Af Amer: 88 mL/min — ABNORMAL LOW (ref >90)
Glucose, Bld: 134 mg/dL — ABNORMAL HIGH (ref 70–99)
Total Protein: 5.6 g/dL — ABNORMAL LOW (ref 6.0–8.3)

## 2013-03-09 LAB — GLUCOSE, CAPILLARY: Glucose-Capillary: 119 mg/dL — ABNORMAL HIGH (ref 70–99)

## 2013-03-09 LAB — CK: Total CK: 100 U/L (ref 7–232)

## 2013-03-09 LAB — LACTATE DEHYDROGENASE: LDH: 326 U/L — ABNORMAL HIGH (ref 94–250)

## 2013-03-09 MED ORDER — TAMSULOSIN HCL 0.4 MG PO CAPS
0.4000 mg | ORAL_CAPSULE | Freq: Every day | ORAL | Status: DC
  Administered 2013-03-09 – 2013-03-15 (×7): 0.4 mg via ORAL
  Filled 2013-03-09 (×9): qty 1

## 2013-03-09 NOTE — Progress Notes (Signed)
[facility]                                                                                Patient Demographics  Walter Larsen, is a 77 y.o. male, DOB - [birth year], RUE:[id number]  Admit date - 02/28/2013   Admitting Physician Lynden Oxford, MD  Outpatient Primary MD for the patient is Provider Not In System  LOS - 9   Chief Complaint  Patient presents with  . Altered Mental Status      Interim history This is a 77 year old past medical history of hypertension, dyslipidemia, recent admission was for showing hemorrhagic stroke. Patient was discharged with home and was found to have altered mental status with fevers. He was brought into the emergency department here at [facility] with hallucinations and confabulation. Patient continues to have fevers. Neurosurgery cells ID and neurology been consulted and following patient. Infectious disease workup including a urinalysis chest x-ray MRI of the head to rule out abscess have all been conducted with no source of infection at this time. Patient has been on broad-spectrum antibiotics for quite some time. Patient had MRI conducted showing no abscess. TEE was also conducted showing no endocarditis. Antibiotics were discontinued 11/25  Assessment & Plan   Principal Problem:   Fever, unspecified Active Problems:   Hemorrhagic stroke   Essential hypertension, benign   Other and unspecified hyperlipidemia   Bradycardia   Hypokalemia   Dry eyes   Hyponatremia   Headache   Acute encephalopathy   Pulmonary emboli  Fever -Unknown etiology, may be secondary to Recent Temporal/occiptal hematoma -Urine analysis negative -Urine and blood cultures shows no growth at this time.  -Patient has been on empiric antibiotic coverage with vancomycin and ceftrixone, flagyl.  Infectious disease input appreciated discontinued antibiotics 11/25 -ID and neurosurgery consulted and following -MRI suggests emboli -TEE showed no obvious vegetation, no  thrombus, no PFO  Pulmonary emboli as well as right lower extremity DVT-undetermined age  green field filter placed 11/25 emergently back radiology  precarious situation as cannot anticoagulate given recent hemorrhagic transformation of hematoma  Hypokalemia -K  3.5, will continue to monitor -Magnesium 2  Right temporal/occipital parenchymal hematoma -Neurosurgery and neurology consulted and following.   -CT Scan: Right temporal/occipital parenchymal hematoma, measuring up to 7 cm in length. Trace intraventricular extension; no hydrocephalus. -ASA held  Encephalopathy/ AMS -Resolved, no longer altered, at baseline -Neurology following  -EEG: This is a normal EEG recording during sleep. No evidence of an epileptic disorder was seen.  Bradycardia -Resovled  HTN -Continue metoprolol 50mg  BID. Continue hydralazine PRN.  Hyperlipidemia -Continue atorvastatin   Hyponatremia -Improving, Na 135, will continue IVF.  May be secondary to insensible losses.  Headaches -Continue depakote (for headache and possible seizure)  Dry Eyes Continue artificial tears.  Code Status: Full  Family Communication: Wife at bedside  Disposition Plan: Admitted-possible transfer to inpatient rehabilitation if continues to do well  Procedures EEG: This is a normal EEG recording during sleep. No evidence of an epileptic disorder was seen.  MRI IMPRESSION:  Large subacute hematoma right temporoparietal lobe. Superimposed abscess not considered likely.  Small areas of acute infarct in the right occipital parietal lobe and  left temporoparietal lobe, question emboli.  TEE Impressions: No vegetations present.  Consults   Neurology Neurosurgery Infectious Disease Cardiology  DVT Prophylaxis  SCDs  Lab Results  Component Value Date   PLT 239 03/08/2013    Medications  Scheduled Meds: . atorvastatin  20 mg Oral QHS  . divalproex  500 mg Oral Daily  . feeding supplement (ENSURE  COMPLETE)  237 mL Oral BID BM  . LORazepam  1 mg Intravenous Once  . metoprolol tartrate  50 mg Oral BID  . pantoprazole  40 mg Oral Daily  . tamsulosin  0.4 mg Oral QPC breakfast   Continuous Infusions: . sodium chloride 100 mL/hr at 03/09/13 1619   PRN Meds:.acetaminophen, acetaminophen, cyclobenzaprine, hydrALAZINE, ondansetron (ZOFRAN) IV, polyvinyl alcohol  Antibiotics    Anti-infectives   Start     Dose/Rate Route Frequency Ordered Stop   03/05/13 0800  vancomycin (VANCOCIN) 1,250 mg in sodium chloride 0.9 % 250 mL IVPB  Status:  Discontinued     1,250 mg 166.7 mL/hr over 90 Minutes Intravenous Every 8 hours 03/05/13 0225 03/07/13 2041   03/03/13 0300  cefTRIAXone (ROCEPHIN) 2 g in dextrose 5 % 50 mL IVPB  Status:  Discontinued     2 g 100 mL/hr over 30 Minutes Intravenous Every 12 hours 03/02/13 1453 03/07/13 2041   03/02/13 1300  cefTRIAXone (ROCEPHIN) 2 g in dextrose 5 % 50 mL IVPB  Status:  Discontinued     2 g 100 mL/hr over 30 Minutes Intravenous Every 24 hours 03/02/13 1241 03/02/13 1453   03/02/13 1300  metroNIDAZOLE (FLAGYL) IVPB 500 mg  Status:  Discontinued     500 mg 100 mL/hr over 60 Minutes Intravenous Every 6 hours 03/02/13 1241 03/07/13 0914   03/02/13 1300  vancomycin (VANCOCIN) IVPB 1000 mg/200 mL premix  Status:  Discontinued     1,000 mg 200 mL/hr over 60 Minutes Intravenous Every 12 hours 03/02/13 1252 03/05/13 0224   03/01/13 1400  cefTRIAXone (ROCEPHIN) 2 g in dextrose 5 % 50 mL IVPB  Status:  Discontinued     2 g 100 mL/hr over 30 Minutes Intravenous Every 24 hours 03/01/13 1245 03/02/13 0952   03/01/13 1200  vancomycin (VANCOCIN) IVPB 1000 mg/200 mL premix  Status:  Discontinued     1,000 mg 200 mL/hr over 60 Minutes Intravenous Every 12 hours 03/01/13 0353 03/02/13 0952   03/01/13 0600  piperacillin-tazobactam (ZOSYN) IVPB 3.375 g  Status:  Discontinued     3.375 g 12.5 mL/hr over 240 Minutes Intravenous 3 times per day 03/01/13 0353 03/01/13 1245    02/28/13 2300  piperacillin-tazobactam (ZOSYN) IVPB 3.375 g     3.375 g 100 mL/hr over 30 Minutes Intravenous  Once 02/28/13 2249 03/01/13 0007   02/28/13 2300  vancomycin (VANCOCIN) IVPB 1000 mg/200 mL premix     1,000 mg 200 mL/hr over 60 Minutes Intravenous  Once 02/28/13 2249 03/01/13 0104       Time Spent in minutes   20 minutes  Pleas Koch, MD [facility] [phone]   [facility] Office  [phone]    Subjective:   Jaran Sainz seen and examined today.doing fair. Wife and. Understands gravity of situation   no other issues currently. Headaches are moderate and are resolved by Tylenol/, Tramadol  Objective:   Filed Vitals:   03/09/13 0120 03/09/13 0535 03/09/13 0927 03/09/13 1332  BP: 136/75 148/74 122/65 145/72  Pulse: 93 94 80 78  Temp: 99.7 F (37.6 C) 98.3  F (36.8 C) 98.3 F (36.8 C) 97.3 F (36.3 C)  TempSrc: Oral Oral Oral Oral  Resp: 22 20 20 20   Height:      Weight:      SpO2: 95% 96% 97% 97%    Wt Readings from Last 3 Encounters:  03/01/13 81.647 kg (180 lb)  03/01/13 81.647 kg (180 lb)     Intake/Output Summary (Last 24 hours) at 03/09/13 1655 Last data filed at 03/09/13 1400  Gross per 24 hour  Intake    420 ml  Output   1300 ml  Net   -880 ml    Exam  General: Well developed, well nourished, NAD, appears stated age  HEENT: NCAT, Anicteic Sclera, mucous membranes moist.   Neck: Supple, no JVD, no masses  Cardiovascular: S1 S2 auscultated, no rubs, murmurs or gallops. Regular rate and rhythm.  Respiratory: Clear to auscultation bilaterally with equal chest rise  Abdomen: Soft, nontender, nondistended, + bowel sounds  Extremities: warm dry without cyanosis clubbing or edema  Neuro: AAOx3, no neurological deficits  Skin: Without rashes exudates or nodules  Psych: Normal affect and demeanor with intact judgement and insight  Data Review   Micro Results Recent Results (from the past 240 hour(s))   CULTURE, BLOOD (ROUTINE X 2)     Status: None   Collection Time    02/28/13 11:35 PM      Result Value Range Status   Specimen Description BLOOD LEFT WRIST   Final   Special Requests BOTTLES DRAWN AEROBIC AND ANAEROBIC 10CC EACH   Final   Culture  Setup Time     Final   Value: 03/01/2013 04:45     Performed at [facility]   Culture     Final   Value: NO GROWTH 5 DAYS     Performed at [facility]   Report Status 03/07/2013 FINAL   Final  CULTURE, BLOOD (ROUTINE X 2)     Status: None   Collection Time    02/28/13 11:40 PM      Result Value Range Status   Specimen Description BLOOD LEFT HAND   Final   Special Requests BOTTLES DRAWN AEROBIC ONLY 5CC   Final   Culture  Setup Time     Final   Value: 03/01/2013 04:44     Performed at [facility]   Culture     Final   Value: NO GROWTH 5 DAYS     Performed at [facility]   Report Status 03/07/2013 FINAL   Final  URINE CULTURE     Status: None   Collection Time    03/01/13 12:16 AM      Result Value Range Status   Specimen Description URINE, CLEAN CATCH   Final   Special Requests NONE   Final   Culture  Setup Time     Final   Value: 03/01/2013 01:00     Performed at [facility] Count     Final   Value: NO GROWTH     Performed at [facility]   Culture     Final   Value: NO GROWTH     Performed at [facility]   Report Status 03/02/2013 FINAL   Final    Radiology Reports Dg Chest 2 View  03/01/2013   CLINICAL DATA:  Fever.  EXAM: CHEST - 2 VIEW  COMPARISON:  02/28/2013  FINDINGS: The heart size and mediastinal contours are within  normal limits. There is no evidence of pulmonary edema, consolidation, pneumothorax, nodule or pleural fluid. Stable degenerative changes are seen in the thoracic spine  IMPRESSION: No active disease.   Electronically Signed   By: Irish Lack M.D.   On: 03/01/2013 16:35   Ct Head Wo Contrast  02/28/2013   CLINICAL DATA:   Altered mental status.  Stroke.  EXAM: CT HEAD WITHOUT CONTRAST  TECHNIQUE: Contiguous axial images were obtained from the base of the skull through the vertex without intravenous contrast.  COMPARISON:  None.  FINDINGS: Skull and Sinuses:No significant abnormality.  Orbits: Bilateral cataract resection.  Brain: Acute hemorrhage in the posterior right cerebral hemisphere, located in the posterior temporal and occipital regions. The hematoma is irregularly shaped, but the entire area of abnormality measures 7cm in length, 3 cm in width. There is a moderate amount of surrounding cerebral edema. Minimal intraventricular extension into the right lateral ventricle. No herniation, midline shift, or hydrocephalus.  Chronic small vessel ischemic white matter disease with patchy bilateral cerebral white matter low density. Extensive intracranial atherosclerotic calcification.  Critical Value/emergent results were called by telephone at the time of interpretation on 02/28/2013 at 11:13 PM to Dr.JOSHUA ZAVITZ , who verbally acknowledged these results.  IMPRESSION: 1. Right temporal/occipital parenchymal hematoma, measuring up to 7 cm in length. Trace intraventricular extension; no hydrocephalus. 2. Chronic small vessel ischemic white matter disease.   Electronically Signed   By: Tiburcio Pea M.D.   On: 02/28/2013 23:16   Dg Chest Portable 1 View  02/28/2013   CLINICAL DATA:  Altered mental status.  EXAM: PORTABLE CHEST - 1 VIEW  COMPARISON:  None.  FINDINGS: The heart size and mediastinal contours are within normal limits. Both lungs are clear. The visualized skeletal structures are unremarkable.  IMPRESSION: No active disease.   Electronically Signed   By: Tiburcio Pea M.D.   On: 02/28/2013 23:03    CBC  Recent Labs Lab 03/04/13 0345 03/05/13 0945 03/06/13 0555 03/07/13 0550 03/08/13 0610  WBC 14.2* 13.7* 16.1* 13.0* 13.8*  HGB 14.0 13.2 13.3 12.3* 13.2  HCT 40.2 37.9* 38.0* 34.8* 37.5*  PLT 196 213  238 240 239  MCV 91.8 91.5 91.3 90.4 90.8  MCH 32.0 31.9 32.0 31.9 32.0  MCHC 34.8 34.8 35.0 35.3 35.2  RDW 13.3 13.5 13.4 13.6 13.8    Chemistries   Recent Labs Lab 03/04/13 0345 03/06/13 0555 03/07/13 0550 03/08/13 0610 03/09/13 0952  NA 131* 129* 131* 135 134*  K 3.6 3.1* 3.5 3.5 3.9  CL 97 97 99 101 101  CO2 23 20 21 20 22   GLUCOSE 123* 100* 97 103* 134*  BUN 10 10 13 19 16   CREATININE 0.78 0.68 0.69 1.29 0.76  CALCIUM 8.6 8.3* 8.3* 8.5 8.7  MG  --  2.0  --   --   --   AST  --   --   --   --  39*  ALT  --   --   --   --  31  ALKPHOS  --   --   --   --  109  BILITOT  --   --   --   --  0.6   ------------------------------------------------------------------------------------------------------------------ estimated creatinine clearance is 78.4 ml/min (by C-G formula based on Cr of 0.76). ------------------------------------------------------------------------------------------------------------------ No results found for this basename: HGBA1C,  in the last 72 hours ------------------------------------------------------------------------------------------------------------------ No results found for this basename: CHOL, HDL, LDLCALC, TRIG, CHOLHDL, LDLDIRECT,  in the last 72  hours ------------------------------------------------------------------------------------------------------------------ No results found for this basename: TSH, T4TOTAL, FREET3, T3FREE, THYROIDAB,  in the last 72 hours ------------------------------------------------------------------------------------------------------------------ No results found for this basename: VITAMINB12, FOLATE, FERRITIN, TIBC, IRON, RETICCTPCT,  in the last 72 hours  Coagulation profile No results found for this basename: INR, PROTIME,  in the last 168 hours  No results found for this basename: DDIMER,  in the last 72 hours  Cardiac Enzymes No results found for this basename: CK, CKMB, TROPONINI, MYOGLOBIN,  in the last  168 hours ------------------------------------------------------------------------------------------------------------------ No components found with this basename: POCBNP,

## 2013-03-09 NOTE — Progress Notes (Signed)
VASCULAR LAB PRELIMINARY  PRELIMINARY  PRELIMINARY  PRELIMINARY  Bilateral lower extremity venous duplex  completed.    Preliminary report:  Right:  Mobile DVT noted in the CFV.  DVT noted in the peroneal vein.  Superficial thrombus noted in the LSV.  No Baker's cyst.  Left:  DVT noted in the peroneal vein.  Reese Stockman, RVT 03/09/2013, 2:42 PM

## 2013-03-09 NOTE — Progress Notes (Signed)
Physical Therapy Treatment Patient Details Name: Veer Elamin MRN: [id number] DOB: 06/14/1935 Today's Date: 03/09/2013 Time: 4098-1191 PT Time Calculation (min): 25 min  PT Assessment / Plan / Recommendation  History of Present Illness Vimal Derego is a 77 y.o. male with Past medical history of hypertension, dyslipidemia, recent admission to [facility] with hemorrhagic stroke. The patient is presenting with complaints of altered mental status. He was brought in by his family. As per the family since last one week he had significant headache, after which he was taken to [facility] for hemorrhagic stroke. From where he was discharged day before yesterday. Today he has been having hallucinations and confabulation. He was talking about basketball match but there was none. He also complained of some further frontal headache and some nausea. There were episodes of staring during which he would take a while to respond. And also he had an episode of urinary incontinence. By the EMS arrived he was found to be febrile as well.   PT Comments   Patient demonstrates improvements in mobility and activity tolerance today. Patient was able to ambulate with use of RW minimal distance with moderate assist, occasional max assist to stabilize during ambulation. Patient also tolerate multiple transfers and performed several functional tasks seated EOB. Will continue to work with patient and progress activity as tolerated. Continue to recommend CIR.  Follow Up Recommendations  CIR     Does the patient have the potential to tolerate intense rehabilitation     Barriers to Discharge        Equipment Recommendations  Other (comment) (TBD)    Recommendations for Other Services Rehab consult  Frequency Min 3X/week   Progress towards PT Goals Progress towards PT goals: Progressing toward goals  Plan Current plan remains appropriate    Precautions / Restrictions Precautions Precautions:  Fall Restrictions Weight Bearing Restrictions: No   Pertinent Vitals/Pain Headache 6/10    Mobility  Bed Mobility Bed Mobility: Rolling Right;Right Sidelying to Sit;Sitting - Scoot to [place] of Bed;Sit to Sidelying Right Rolling Right: 3: Mod assist Right Sidelying to Sit: 3: Mod assist Sitting - Scoot to Edge of Bed: 4: Min assist Sit to Sidelying Right: 3: Mod assist Details for Bed Mobility Assistance: Assist to elevate trunk and pull to sitting, assist back to supine for positioning and trunk control, assist for LE elevation to bed Transfers Transfers: Sit to Stand;Stand to Sit Sit to Stand: 3: Mod assist Stand to Sit: 4: Min assist Details for Transfer Assistance: improved ability today, performed x4 Ambulation/Gait Ambulation/Gait Assistance: 3: Mod assist;2: Max assist Ambulation Distance (Feet): 26 Feet Assistive device: Rolling walker Ambulation/Gait Assistance Details: assist for control and positioing with RW.  Assist for stability, Max cues for upright posture and stride. Patient with anterior lean during ambulation Gait Pattern: Step-to pattern;Decreased stride length;Trunk flexed Gait velocity: increased assist for stability to prevent LOB, improved ability compared to prior session    Exercises  Ankle pumps, long arc quads, hip flexion/marching; seated EOB, 10 reps, both sides     PT Goals (current goals can now be found in the care plan section) Acute Rehab PT Goals Patient Stated Goal: to get back to workin PT Goal Formulation: With patient Time For Goal Achievement: 03/22/13 Potential to Achieve Goals: Good  Visit Information  Last PT Received On: 03/09/13 Assistance Needed: +2 History of Present Illness: Navarre Diana is a 77 y.o. male with Past medical history of hypertension, dyslipidemia, recent admission to [facility] with  hemorrhagic stroke. The patient is presenting with complaints of altered mental status. He was brought in by his family. As per  the family since last one week he had significant headache, after which he was taken to [facility] for hemorrhagic stroke. From where he was discharged day before yesterday. Today he has been having hallucinations and confabulation. He was talking about basketball match but there was none. He also complained of some further frontal headache and some nausea. There were episodes of staring during which he would take a while to respond. And also he had an episode of urinary incontinence. By the EMS arrived he was found to be febrile as well.    Subjective Data  Patient Stated Goal: to get back to workin   Cognition  Cognition Arousal/Alertness: Awake/alert Behavior During Therapy: Flat affect Overall Cognitive Status: Within Functional Limits for tasks assessed    Balance  Static Standing Balance Static Standing - Balance Support: Bilateral upper extremity supported;During functional activity Static Standing - Level of Assistance: 4: Min assist Static Standing - Comment/# of Minutes: 6 minutes  End of Session PT - End of Session Equipment Utilized During Treatment: Gait belt Activity Tolerance: Patient limited by fatigue;Patient limited by lethargy Patient left: in bed;with call bell/phone within reach;with bed alarm set;with family/visitor present Nurse Communication: Mobility status   GP     Fabio Asa 03/09/2013, 2:39 PM Charlotte Crumb, PT DPT  [phone]

## 2013-03-09 NOTE — Progress Notes (Signed)
OT Cancellation Note  Patient Details Name: Walter Larsen MRN: [id number] DOB: 20-Dec-1935   Cancelled Treatment:    Reason Eval/Treat Not Completed: Medical issues which prohibited therapy  Pt getting scan when OT arrived and was notified he has a mobile thrombus. Will re-attempt as time allows.   Earlie Raveling OTR/L [phone] 03/09/2013, 2:39 PM

## 2013-03-09 NOTE — Consult Note (Signed)
PULMONARY  / CRITICAL CARE MEDICINE  Name: Walter Larsen MRN: [id number] DOB: 1935/10/06    ADMISSION DATE:  02/28/2013 CONSULTATION DATE:  03/08/2013  REFERRING MD :  [facility] PRIMARY SERVICE: TRH  CHIEF COMPLAINT:  Fever and hypertension s/p CVA with hemorrhagic transformation  BRIEF PATIENT DESCRIPTION:  Walter Larsen is a 77 year old Caucasian male with HTN, CAD, Chronic Back Pain, BPH and a recent CVA diagnosed on 11/13 at [facility].  [facility] was diagnosed and at [facility] found to have hemorrhagic transformation.  He has residual left vision problem after the stroke.  He was discharged from [facility] on 02/27/2013.  On Monday 11/17, he came to [facility] ED for further evaluation of fever of 103 and hypertension.  To date no source of infection was found to explain his fever.  CT chest and abdomen to evaluate fever reveal moderate burden PE bilaterally with right heart strained on CT.  PCCM is consulted for further input into the patient care.  Upon chart reviewed, neurosurgery is following patient agree that patient cannot be anticoagulated for his PE given hematoma found on CT scan on admission.  SIGNIFICANT EVENTS / STUDIES:  11/17 CT head >>> Right temporal/occipital parenchymal hematoma 11/24   TTE >>> No thrombus, no vegetation, normal LVEF 11/25 CT chest/abdomen/pelvis >>> Bilateral lobar and segmental PE, moderate clot burden. Right heart strain. Small left and trace  right pleural effusions 11/25   IVC filter placed  LINES / TUBES: Condom catheter 11/25 >>> 11/25 Foley 11/25 >>>  CULTURES: Blood 11/17 >>> neg Urine 11/18 >>> neg  ANTIBIOTICS:  SUBJECTIVE: No overnight events. IVC filter placed.  VITAL SIGNS: Temp:  [97.8 F (36.6 C)-99.7 F (37.6 C)] 98.3 F (36.8 C) (11/26 0927) Pulse Rate:  [80-113] 80 (11/26 0927) Resp:  [20-25] 20 (11/26 0927) BP: (112-158)/(65-92) 122/65 mmHg (11/26 0927) SpO2:  [91 %-97 %] 97 % (11/26  0927)  HEMODYNAMICS:   VENTILATOR SETTINGS:   INTAKE / OUTPUT: Intake/Output     11/25 0701 - 11/26 0700 11/26 0701 - 11/27 0700   P.O. 660 200   Total Intake(mL/kg) 660 (8.1) 200 (2.4)   Urine (mL/kg/hr) 3425 (1.7)    Total Output 3425     Net -2765 +200        Stool Occurrence 1 x 1 x     PHYSICAL EXAMINATION: General:  No distress, looking comfortable Neuro:  Awake, alert, left ptosis HEENT:  PERRL, OETT / OGT Cardiovascular:  RRR, no m/r/g Lungs:  Bilateral diminished air entry, no w/r/r Abdomen:  Soft, nontender, bowel sounds diminished Musculoskeletal:  Moves all extremities, no edema Skin:  Intact  LABS:  CBC  Recent Labs Lab 03/06/13 0555 03/07/13 0550 03/08/13 0610  WBC 16.1* 13.0* 13.8*  HGB 13.3 12.3* 13.2  HCT 38.0* 34.8* 37.5*  PLT 238 240 239   Coag's No results found for this basename: APTT, INR,  in the last 168 hours  BMET  Recent Labs Lab 03/06/13 0555 03/07/13 0550 03/08/13 0610  NA 129* 131* 135  K 3.1* 3.5 3.5  CL 97 99 101  CO2 20 21 20   BUN 10 13 19   CREATININE 0.68 0.69 1.29  GLUCOSE 100* 97 103*   Electrolytes  Recent Labs Lab 03/06/13 0555 03/07/13 0550 03/08/13 0610  CALCIUM 8.3* 8.3* 8.5  MG 2.0  --   --   PHOS 2.1*  --   --    Sepsis Markers No  results found for this basename: LATICACIDVEN, PROCALCITON, O2SATVEN,  in the last 168 hours ABG No results found for this basename: PHART, PCO2ART, PO2ART,  in the last 168 hours Liver Enzymes No results found for this basename: AST, ALT, ALKPHOS, BILITOT, ALBUMIN,  in the last 168 hours Cardiac Enzymes No results found for this basename: TROPONINI, PROBNP,  in the last 168 hours Glucose  Recent Labs Lab 03/07/13 1100 03/07/13 2007 03/07/13 2344 03/08/13 0354  GLUCAP 88 133* 107* 96   CXR:  None today  ASSESSMENT / PLAN:  PULMONARY A: Bilateral Moderated Burden PE on CTA (hemodynamically stable), limited symptoms. S/p IVC filter. P:   No  anticoagulation / antiplatelet given patient recent hemorrhagic stroke (discussed neurology / neurosurgery) Follow BLLE Dopplers  CARDIOVASCULAR A: CAD, distal history of coronary stents. HTN. P:  Lipitor, Metoprolol Hydralazine PRN  RENAL A:   AKI. Contrast nephropathy? Obstructive? P:   Trend BMP Flomax Foley  GASTROINTESTINAL A:  No active issues. P:   Protonix  HEMATOLOGIC A:  No active issues. P:  No anticoagulation / antiplatelets as hemorrhagic CVA Defer SCDs until venous Doppler LE  INFECTIOUS A:   Leukocytosis, stable. Stress induced? P:   Monitor off abx  ENDOCRINE A:  No active issues. P:   No intervention required  NEUROLOGIC A:   Temporal/occipital CVA with hemorrhagic transformation P:   Per Neurology / Neurosurgery  PCCM will sign off.  Please re consult if necessary.  I have personally obtained history, examined patient, evaluated and interpreted laboratory and imaging results, reviewed medical records, formulated assessment / plan and placed orders.  Lonia Farber, MD Pulmonary and Critical Care Medicine [facility] Pager: [phone]  03/09/2013, 9:45 AM

## 2013-03-10 DIAGNOSIS — I619 Nontraumatic intracerebral hemorrhage, unspecified: Secondary | ICD-10-CM

## 2013-03-10 LAB — EPSTEIN-BARR VIRUS VCA ANTIBODY PANEL
EBV VCA IgG: <10 U/mL (ref <18.0)
EBV VCA IgM: <10 U/mL (ref <36.0)

## 2013-03-10 LAB — CMV IGM: CMV IgM: <8 AU/mL (ref <30.00)

## 2013-03-10 MED ORDER — DIPHENHYDRAMINE HCL 25 MG PO CAPS
25.0000 mg | ORAL_CAPSULE | Freq: Once | ORAL | Status: AC
  Administered 2013-03-10: 25 mg via ORAL
  Filled 2013-03-10: qty 1

## 2013-03-10 NOTE — Progress Notes (Signed)
[facility]                                                                                Patient Demographics  Walter Larsen, is a 77 y.o. male, DOB - 07-19-1935, JYN:[id number]  Admit date - 02/28/2013   Admitting Physician Lynden Oxford, MD  Outpatient Primary MD for the patient is Provider Not In System  LOS - 10   Chief Complaint  Patient presents with  . Altered Mental Status      Interim history This is a 77 year old past medical history of hypertension, dyslipidemia, recent admission was for showing hemorrhagic stroke. Patient was discharged with home and was found to have altered mental status with fevers. He was brought into the emergency department here at [facility] with hallucinations and confabulation. Patient continues to have fevers. Neurosurgery cells ID and neurology been consulted and following patient. Infectious disease workup including a urinalysis chest x-ray MRI of the head to rule out abscess have all been conducted with no source of infection at this time. Patient has been on broad-spectrum antibiotics for quite some time. Patient had MRI conducted showing no abscess. TEE was also conducted showing no endocarditis. Antibiotics were discontinued 11/25.  Assessment & Plan   Principal Problem:   Fever, unspecified Active Problems:   Hemorrhagic stroke   Essential hypertension, benign   Other and unspecified hyperlipidemia   Bradycardia   Hypokalemia   Dry eyes   Hyponatremia   Headache   Acute encephalopathy   Pulmonary emboli  Fever -Unknown etiology, may be secondary to Recent Temporal/occiptal hematoma -Urine analysis negative -Urine and blood cultures shows no growth at this time.  -Patient has been on empiric antibiotic coverage with vancomycin and ceftrixone, flagyl.  Infectious disease input appreciated discontinued antibiotics 11/25 -ID and neurosurgery consulted and following -MRI suggests emboli -TEE showed no obvious vegetation, no  thrombus, no PFO  Pulmonary emboli as well as right lower extremity DVT-undetermined age  green field filter placed 11/25 emergently back radiology  precarious situation as cannot anticoagulate given recent hemorrhagic transformation of hematoma  Hypokalemia -K  3.5, will continue to monitor -Magnesium 2  Right temporal/occipital parenchymal hematoma -Neurosurgery and neurology consulted and following.   -CT Scan: Right temporal/occipital parenchymal hematoma, measuring up to 7 cm in length. Trace intraventricular extension; no hydrocephalus. -ASA held  Encephalopathy/ AMS -Resolved, no longer altered, at baseline -Neurology following  -EEG: This is a normal EEG recording during sleep. No evidence of an epileptic disorder was seen.  Bradycardia -Resovled  HTN -Continue metoprolol 50mg  BID. Continue hydralazine PRN.  BPH -Clamp foley.  Monitor for sensation.  Increase flomax 0.4--0.8 11/27  Hyperlipidemia -Continue atorvastatin   Hyponatremia -Improving, Na 135, will continue IVF.  May be secondary to insensible losses.  Headaches -Continue depakote (for headache and possible seizure)  Dry Eyes Continue artificial tears.  Code Status: Full  Family Communication: Wife at bedside  Disposition Plan: Admitted-possible transfer to inpatient rehabilitation if continues to do well  Procedures EEG: This is a normal EEG recording during sleep. No evidence of an epileptic disorder was seen.  MRI IMPRESSION:  Large subacute hematoma right temporoparietal lobe. Superimposed abscess not considered likely.  Small areas of acute infarct in the right occipital parietal lobe and left temporoparietal lobe, question emboli.  TEE Impressions: No vegetations present.  Consults   Neurology Neurosurgery Infectious Disease Cardiology  DVT Prophylaxis  SCDs  Lab Results  Component Value Date   PLT 239 03/08/2013    Medications  Scheduled Meds: . atorvastatin  20 mg  Oral QHS  . divalproex  500 mg Oral Daily  . feeding supplement (ENSURE COMPLETE)  237 mL Oral BID BM  . LORazepam  1 mg Intravenous Once  . metoprolol tartrate  50 mg Oral BID  . pantoprazole  40 mg Oral Daily  . tamsulosin  0.4 mg Oral QPC breakfast   Continuous Infusions: . sodium chloride 100 mL/hr at 03/10/13 0118   PRN Meds:.acetaminophen, acetaminophen, cyclobenzaprine, hydrALAZINE, ondansetron (ZOFRAN) IV, polyvinyl alcohol  Antibiotics    Anti-infectives   Start     Dose/Rate Route Frequency Ordered Stop   03/05/13 0800  vancomycin (VANCOCIN) 1,250 mg in sodium chloride 0.9 % 250 mL IVPB  Status:  Discontinued     1,250 mg 166.7 mL/hr over 90 Minutes Intravenous Every 8 hours 03/05/13 0225 03/07/13 2041   03/03/13 0300  cefTRIAXone (ROCEPHIN) 2 g in dextrose 5 % 50 mL IVPB  Status:  Discontinued     2 g 100 mL/hr over 30 Minutes Intravenous Every 12 hours 03/02/13 1453 03/07/13 2041   03/02/13 1300  cefTRIAXone (ROCEPHIN) 2 g in dextrose 5 % 50 mL IVPB  Status:  Discontinued     2 g 100 mL/hr over 30 Minutes Intravenous Every 24 hours 03/02/13 1241 03/02/13 1453   03/02/13 1300  metroNIDAZOLE (FLAGYL) IVPB 500 mg  Status:  Discontinued     500 mg 100 mL/hr over 60 Minutes Intravenous Every 6 hours 03/02/13 1241 03/07/13 0914   03/02/13 1300  vancomycin (VANCOCIN) IVPB 1000 mg/200 mL premix  Status:  Discontinued     1,000 mg 200 mL/hr over 60 Minutes Intravenous Every 12 hours 03/02/13 1252 03/05/13 0224   03/01/13 1400  cefTRIAXone (ROCEPHIN) 2 g in dextrose 5 % 50 mL IVPB  Status:  Discontinued     2 g 100 mL/hr over 30 Minutes Intravenous Every 24 hours 03/01/13 1245 03/02/13 0952   03/01/13 1200  vancomycin (VANCOCIN) IVPB 1000 mg/200 mL premix  Status:  Discontinued     1,000 mg 200 mL/hr over 60 Minutes Intravenous Every 12 hours 03/01/13 0353 03/02/13 0952   03/01/13 0600  piperacillin-tazobactam (ZOSYN) IVPB 3.375 g  Status:  Discontinued     3.375 g 12.5  mL/hr over 240 Minutes Intravenous 3 times per day 03/01/13 0353 03/01/13 1245   02/28/13 2300  piperacillin-tazobactam (ZOSYN) IVPB 3.375 g     3.375 g 100 mL/hr over 30 Minutes Intravenous  Once 02/28/13 2249 03/01/13 0007   02/28/13 2300  vancomycin (VANCOCIN) IVPB 1000 mg/200 mL premix     1,000 mg 200 mL/hr over 60 Minutes Intravenous  Once 02/28/13 2249 03/01/13 0104       Time Spent in minutes   20 minutes  Pleas Koch, MD [facility] [phone]   [facility] Office  [phone]    Subjective:   Doing well.  Still no sensation of urine No other issues.  tolerating diet.  Objective:   Filed Vitals:   03/10/13 0200 03/10/13 0514 03/10/13 1000 03/10/13 1419  BP: 118/57 156/60 159/79 163/85  Pulse: 73 76 80 75  Temp: 98.3 F (36.8 C) 98.6 F (37  C) 98 F (36.7 C) 98.3 F (36.8 C)  TempSrc: Oral Oral Oral Oral  Resp: 18 20 20 20   Height:      Weight:      SpO2: 95% 92% 98% 93%    Wt Readings from Last 3 Encounters:  03/01/13 81.647 kg (180 lb)  03/01/13 81.647 kg (180 lb)     Intake/Output Summary (Last 24 hours) at 03/10/13 1523 Last data filed at 03/10/13 1200  Gross per 24 hour  Intake   1920 ml  Output   1480 ml  Net    440 ml    Exam  General: Well developed, well nourished, NAD, appears stated age  HEENT: NCAT, Anicteic Sclera, mucous membranes moist.   Neck: Supple, no JVD, no masses  Cardiovascular: S1 S2 auscultated, no rubs, murmurs or gallops. Regular rate and rhythm.  Respiratory: Clear to auscultation bilaterally with equal chest rise  Abdomen: Soft, nontender, nondistended, + bowel sounds  Extremities: warm dry without cyanosis clubbing or edema  Neuro: AAOx3, no neurological deficits  Skin: Without rashes exudates or nodules  Psych: Normal affect and demeanor with intact judgement and insight  Data Review   Micro Results Recent Results (from the past 240 hour(s))  CULTURE, BLOOD (ROUTINE X 2)      Status: None   Collection Time    02/28/13 11:35 PM      Result Value Range Status   Specimen Description BLOOD LEFT WRIST   Final   Special Requests BOTTLES DRAWN AEROBIC AND ANAEROBIC 10CC EACH   Final   Culture  Setup Time     Final   Value: 03/01/2013 04:45     Performed at [facility]   Culture     Final   Value: NO GROWTH 5 DAYS     Performed at [facility]   Report Status 03/07/2013 FINAL   Final  CULTURE, BLOOD (ROUTINE X 2)     Status: None   Collection Time    02/28/13 11:40 PM      Result Value Range Status   Specimen Description BLOOD LEFT HAND   Final   Special Requests BOTTLES DRAWN AEROBIC ONLY 5CC   Final   Culture  Setup Time     Final   Value: 03/01/2013 04:44     Performed at [facility]   Culture     Final   Value: NO GROWTH 5 DAYS     Performed at [facility]   Report Status 03/07/2013 FINAL   Final  URINE CULTURE     Status: None   Collection Time    03/01/13 12:16 AM      Result Value Range Status   Specimen Description URINE, CLEAN CATCH   Final   Special Requests NONE   Final   Culture  Setup Time     Final   Value: 03/01/2013 01:00     Performed at [facility] Count     Final   Value: NO GROWTH     Performed at [facility]   Culture     Final   Value: NO GROWTH     Performed at [facility]   Report Status 03/02/2013 FINAL   Final    Radiology Reports Dg Chest 2 View  03/01/2013   CLINICAL DATA:  Fever.  EXAM: CHEST - 2 VIEW  COMPARISON:  02/28/2013  FINDINGS: The heart size and mediastinal contours are within normal limits.  There is no evidence of pulmonary edema, consolidation, pneumothorax, nodule or pleural fluid. Stable degenerative changes are seen in the thoracic spine  IMPRESSION: No active disease.   Electronically Signed   By: Irish Lack M.D.   On: 03/01/2013 16:35   Ct Head Wo Contrast  02/28/2013   CLINICAL DATA:  Altered mental status.  Stroke.  EXAM:  CT HEAD WITHOUT CONTRAST  TECHNIQUE: Contiguous axial images were obtained from the base of the skull through the vertex without intravenous contrast.  COMPARISON:  None.  FINDINGS: Skull and Sinuses:No significant abnormality.  Orbits: Bilateral cataract resection.  Brain: Acute hemorrhage in the posterior right cerebral hemisphere, located in the posterior temporal and occipital regions. The hematoma is irregularly shaped, but the entire area of abnormality measures 7cm in length, 3 cm in width. There is a moderate amount of surrounding cerebral edema. Minimal intraventricular extension into the right lateral ventricle. No herniation, midline shift, or hydrocephalus.  Chronic small vessel ischemic white matter disease with patchy bilateral cerebral white matter low density. Extensive intracranial atherosclerotic calcification.  Critical Value/emergent results were called by telephone at the time of interpretation on 02/28/2013 at 11:13 PM to Dr.JOSHUA ZAVITZ , who verbally acknowledged these results.  IMPRESSION: 1. Right temporal/occipital parenchymal hematoma, measuring up to 7 cm in length. Trace intraventricular extension; no hydrocephalus. 2. Chronic small vessel ischemic white matter disease.   Electronically Signed   By: Tiburcio Pea M.D.   On: 02/28/2013 23:16   Dg Chest Portable 1 View  02/28/2013   CLINICAL DATA:  Altered mental status.  EXAM: PORTABLE CHEST - 1 VIEW  COMPARISON:  None.  FINDINGS: The heart size and mediastinal contours are within normal limits. Both lungs are clear. The visualized skeletal structures are unremarkable.  IMPRESSION: No active disease.   Electronically Signed   By: Tiburcio Pea M.D.   On: 02/28/2013 23:03    CBC  Recent Labs Lab 03/04/13 0345 03/05/13 0945 03/06/13 0555 03/07/13 0550 03/08/13 0610  WBC 14.2* 13.7* 16.1* 13.0* 13.8*  HGB 14.0 13.2 13.3 12.3* 13.2  HCT 40.2 37.9* 38.0* 34.8* 37.5*  PLT 196 213 238 240 239  MCV 91.8 91.5 91.3 90.4  90.8  MCH 32.0 31.9 32.0 31.9 32.0  MCHC 34.8 34.8 35.0 35.3 35.2  RDW 13.3 13.5 13.4 13.6 13.8    Chemistries   Recent Labs Lab 03/04/13 0345 03/06/13 0555 03/07/13 0550 03/08/13 0610 03/09/13 0952  NA 131* 129* 131* 135 134*  K 3.6 3.1* 3.5 3.5 3.9  CL 97 97 99 101 101  CO2 23 20 21 20 22   GLUCOSE 123* 100* 97 103* 134*  BUN 10 10 13 19 16   CREATININE 0.78 0.68 0.69 1.29 0.76  CALCIUM 8.6 8.3* 8.3* 8.5 8.7  MG  --  2.0  --   --   --   AST  --   --   --   --  39*  ALT  --   --   --   --  31  ALKPHOS  --   --   --   --  109  BILITOT  --   --   --   --  0.6   ------------------------------------------------------------------------------------------------------------------ estimated creatinine clearance is 78.4 ml/min (by C-G formula based on Cr of 0.76). ------------------------------------------------------------------------------------------------------------------ No results found for this basename: HGBA1C,  in the last 72 hours ------------------------------------------------------------------------------------------------------------------ No results found for this basename: CHOL, HDL, LDLCALC, TRIG, CHOLHDL, LDLDIRECT,  in the last 72 hours ------------------------------------------------------------------------------------------------------------------  No results found for this basename: TSH, T4TOTAL, FREET3, T3FREE, THYROIDAB,  in the last 72 hours ------------------------------------------------------------------------------------------------------------------ No results found for this basename: VITAMINB12, FOLATE, FERRITIN, TIBC, IRON, RETICCTPCT,  in the last 72 hours  Coagulation profile No results found for this basename: INR, PROTIME,  in the last 168 hours  No results found for this basename: DDIMER,  in the last 72 hours  Cardiac Enzymes No results found for this basename: CK, CKMB, TROPONINI, MYOGLOBIN,  in the last 168  hours ------------------------------------------------------------------------------------------------------------------ No components found with this basename: POCBNP,

## 2013-03-11 LAB — PROTEIN ELECTROPHORESIS, SERUM
Albumin ELP: 45.1 % — ABNORMAL LOW (ref 55.8–66.1)
Alpha-1-Globulin: 7.5 % — ABNORMAL HIGH (ref 2.9–4.9)
Beta 2: 7.7 % — ABNORMAL HIGH (ref 3.2–6.5)
Beta Globulin: 6.5 % (ref 4.7–7.2)
Gamma Globulin: 14 % (ref 11.1–18.8)

## 2013-03-11 LAB — ANA: Anti Nuclear Antibody(ANA): NEGATIVE

## 2013-03-11 MED ORDER — AMLODIPINE BESYLATE 5 MG PO TABS
5.0000 mg | ORAL_TABLET | Freq: Every day | ORAL | Status: DC
  Administered 2013-03-11 – 2013-03-14 (×4): 5 mg via ORAL
  Filled 2013-03-11 (×5): qty 1

## 2013-03-11 NOTE — Progress Notes (Signed)
Clinical Social Work Department BRIEF PSYCHOSOCIAL ASSESSMENT 03/11/2013  Patient:  Walter Larsen, Walter Larsen     Account Number:  [id number]     Admit date:  02/28/2013  Clinical Social Worker:  Leron Croak, CLINICAL SOCIAL WORKER  Date/Time:  03/11/2013 02:26 PM  Referred by:  Physician  Date Referred:  03/08/2013 Referred for  SNF Placement   Other Referral:   Interview type:  Patient Other interview type:   Family was also present at the bedside    Wife:  Walter Larsen  [phone]  hm                     [phone]  cell    PSYCHOSOCIAL DATA Living Status:  WIFE Admitted from facility:   Level of care:   Primary support name:  Walter Larsen  [phone] Primary support relationship to patient:  SPOUSE Degree of support available:   Pt has a good support system from family    CURRENT CONCERNS Current Concerns  Post-Acute Placement   Other Concerns:    SOCIAL WORK ASSESSMENT / PLAN CSW met with the Pt and family at the bedside. Pt was alert and oriented x4 and agreeable to speaking with the CSW. CSW introduced self and reason for visit. CSW explained to Pt and family that when a Pt is recommended for CIR and that CSW would like to send Pt information to local SNF facilities in the event that the CIR would not be possible, then the Pt would have an alternate placement option. Pt and family were agreeable to placement search for SNF as a backup. CSW will fax Pt information out to [facility] for SNF search.   Assessment/plan status:  Information/Referral to [facility] Other assessment/ plan:   Information/referral to community resources:   CSW provided family and Pt with a SNF listing of facilities in the [facility] area.    PATIENT'S/FAMILY'S RESPONSE TO PLAN OF CARE: Pt and family were appreciative for the backup options and assistance with d/c planning. MD arrived while CSW present. CSW will follow for d/c planning.        Leron Croak [facility]   [address];  [phone] Phone: [phone]

## 2013-03-11 NOTE — Progress Notes (Signed)
Bladder scan showed >999 cc retained urine, verbal order given to reinsert Foley.  14Fr inserted, 1000 cc yellow urine containing several clots returned.  Pt reports relief.  Will continue to monitor.

## 2013-03-11 NOTE — Progress Notes (Signed)
Clinical Social Work Department CLINICAL SOCIAL WORK PLACEMENT NOTE 03/11/2013  Patient:  Walter Larsen, Walter Larsen  Account Number:  [id number] Admit date:  02/28/2013  Clinical Social Worker:  Leron Croak, CLINICAL SOCIAL WORKER  Date/time:  03/11/2013 02:42 PM  Clinical Social Work is seeking post-discharge placement for this patient at the following level of care:   SKILLED NURSING   (*CSW will update this form in Epic as items are completed)   03/11/2013  Patient/family provided with [facility] Department of Clinical Social Work's list of facilities offering this level of care within the geographic area requested by the patient (or if unable, by the patient's family).  03/11/2013  Patient/family informed of their freedom to choose among providers that offer the needed level of care, that participate in Medicare, Medicaid or managed care program needed by the patient, have an available bed and are willing to accept the patient.  03/11/2013  Patient/family informed of [facility]' ownership interest in [facility], as well as of the fact that they are under no obligation to receive care at this facility.  PASARR submitted to EDS on 03/10/2013 PASARR number received from EDS on 03/10/2013  FL2 transmitted to all facilities in geographic area requested by pt/family on  03/11/2013 FL2 transmitted to all facilities within larger geographic area on 03/11/2013  Patient informed that his/her managed care company has contracts with or will negotiate with  certain facilities, including the following:     Patient/family informed of bed offers received:   Patient chooses bed at  Physician recommends and patient chooses bed at    Patient to be transferred to  on   Patient to be transferred to facility by   The following physician request were entered in Epic:   Additional Comments:    Lenord Carbo  [facility]  [address];  [address] Phone: [phone]

## 2013-03-11 NOTE — Progress Notes (Signed)
Seen and agreed 03/11/2013 Robinette, Julia Elizabeth PTA [phone] pager [phone] office    

## 2013-03-11 NOTE — Progress Notes (Signed)
.  BP 171/83  Pulse 81  Temp(Src) 99.1 F (37.3 C) (Oral)  Resp 20  Ht 5\' 8"  (1.727 m)  Wt 81.647 kg (180 lb)  BMI 27.38 kg/m2  SpO2 98%   PRN hydralazine given. Foley d/c. Pt due to void 16:45pm.

## 2013-03-11 NOTE — Progress Notes (Signed)
is [facility]                                                                                Patient Demographics  Walter Larsen, is a 77 y.o. male, DOB - 1935/11/10, HQI:[id number]  Admit date - 02/28/2013   Admitting Physician Lynden Oxford, MD  Outpatient Primary MD for the patient is Provider Not In System  LOS - 11   Chief Complaint  Patient presents with  . Altered Mental Status      Interim history This is a 77 year old past medical history of hypertension, dyslipidemia, recent admission was for showing hemorrhagic stroke. Patient was discharged with home and was found to have altered mental status with fevers. He was brought into the emergency department here at [facility] with hallucinations and confabulation. Patient continues to have fevers. Neurosurgery cells ID and neurology been consulted and following patient. Infectious disease workup including a urinalysis chest x-ray MRI of the head to rule out abscess have all been conducted with no source of infection at this time. Patient has been on broad-spectrum antibiotics for quite some time. Patient had MRI conducted showing no abscess. TEE was also conducted showing no endocarditis. Antibiotics were discontinued 11/25. However in workup for his fevers, he was found to have extensive PEs with moderate right heart strain and right lower extremity DVT Care situation as recent hemorrhagic stroke therefore cannot anticoagulate and Greenfield filter placed and emergently 11/25 Patient has progressively done well  Assessment & Plan   Principal Problem:   Fever, unspecified Active Problems:   Hemorrhagic stroke   Essential hypertension, benign   Other and unspecified hyperlipidemia   Bradycardia   Hypokalemia   Dry eyes   Hyponatremia   Headache   Acute encephalopathy   Pulmonary emboli  Fever -Unknown etiology, may be secondary to Recent Temporal/occiptal hematoma -Urine analysis negative -Urine and blood cultures  shows no growth at this time.  -Patient has been on empiric antibiotic coverage with vancomycin and ceftrixone, flagyl.  Infectious disease input appreciated discontinued antibiotics 11/25 -ID and neurosurgery consulted and following -MRI suggests emboli -TEE showed no obvious vegetation, no thrombus, no PFO  Pulmonary emboli as well as right lower extremity DVT-undetermined age  green field filter placed 11/25 emergently back radiology  precarious situation as cannot anticoagulate given recent hemorrhagic transformation of hematoma Goals of care have been discussed with the patient and the family on 11/28. Understand precarious situation. Will further delineate in the a.m.-patient for now is  Full code  Hypokalemia -K  3.5, will continue to monitor-labs ordered for a.m.  -Magnesium 2  Right temporal/occipital parenchymal hematoma -Neurosurgery and neurology consulted and following.   -CT Scan: Right temporal/occipital parenchymal hematoma, measuring up to 7 cm in length. Trace intraventricular extension; no hydrocephalus. -ASA held  Encephalopathy/ AMS -Resolved, no longer altered, at baseline -Neurology following  -EEG: This is a normal EEG recording during sleep. No evidence of an epileptic disorder was seen.  Bradycardia -Resovled now has only PVCs, discontinue telemetry   HTN  -Continue metoprolol 50mg  BID. Continue hydralazine PRN.  BPH -Clamp foley.  Monitor for sensation.  Increase flomax 0.4--0.8 11/27  Hyperlipidemia -Continue atorvastatin  Hyponatremia -Improving, Na 135, will continue IVF.  May be secondary to insensible losses.  Headaches -Continue depakote (for headache and possible seizure)  Dry Eyes Continue artificial tears.  Code Status: Full  Family Communication: Wife at bedside  Disposition Plan: Admitted-possible transfer to inpatient rehabilitation if continues to do well  Procedures EEG: This is a normal EEG recording during sleep. No  evidence of an epileptic disorder was seen.  MRI IMPRESSION:  Large subacute hematoma right temporoparietal lobe. Superimposed abscess not considered likely.  Small areas of acute infarct in the right occipital parietal lobe and left temporoparietal lobe, question emboli.  TEE Impressions: No vegetations present.  Consults   Neurology Neurosurgery Infectious Disease Cardiology  DVT Prophylaxis  SCDs  Lab Results  Component Value Date   PLT 239 03/08/2013    Medications  Scheduled Meds: . amLODipine  5 mg Oral Daily  . atorvastatin  20 mg Oral QHS  . divalproex  500 mg Oral Daily  . feeding supplement (ENSURE COMPLETE)  237 mL Oral BID BM  . LORazepam  1 mg Intravenous Once  . metoprolol tartrate  50 mg Oral BID  . pantoprazole  40 mg Oral Daily  . tamsulosin  0.4 mg Oral QPC breakfast   Continuous Infusions:   PRN Meds:.acetaminophen, acetaminophen, cyclobenzaprine, hydrALAZINE, ondansetron (ZOFRAN) IV, polyvinyl alcohol  Antibiotics    Anti-infectives   Start     Dose/Rate Route Frequency Ordered Stop   03/05/13 0800  vancomycin (VANCOCIN) 1,250 mg in sodium chloride 0.9 % 250 mL IVPB  Status:  Discontinued     1,250 mg 166.7 mL/hr over 90 Minutes Intravenous Every 8 hours 03/05/13 0225 03/07/13 2041   03/03/13 0300  cefTRIAXone (ROCEPHIN) 2 g in dextrose 5 % 50 mL IVPB  Status:  Discontinued     2 g 100 mL/hr over 30 Minutes Intravenous Every 12 hours 03/02/13 1453 03/07/13 2041   03/02/13 1300  cefTRIAXone (ROCEPHIN) 2 g in dextrose 5 % 50 mL IVPB  Status:  Discontinued     2 g 100 mL/hr over 30 Minutes Intravenous Every 24 hours 03/02/13 1241 03/02/13 1453   03/02/13 1300  metroNIDAZOLE (FLAGYL) IVPB 500 mg  Status:  Discontinued     500 mg 100 mL/hr over 60 Minutes Intravenous Every 6 hours 03/02/13 1241 03/07/13 0914   03/02/13 1300  vancomycin (VANCOCIN) IVPB 1000 mg/200 mL premix  Status:  Discontinued     1,000 mg 200 mL/hr over 60 Minutes Intravenous  Every 12 hours 03/02/13 1252 03/05/13 0224   03/01/13 1400  cefTRIAXone (ROCEPHIN) 2 g in dextrose 5 % 50 mL IVPB  Status:  Discontinued     2 g 100 mL/hr over 30 Minutes Intravenous Every 24 hours 03/01/13 1245 03/02/13 0952   03/01/13 1200  vancomycin (VANCOCIN) IVPB 1000 mg/200 mL premix  Status:  Discontinued     1,000 mg 200 mL/hr over 60 Minutes Intravenous Every 12 hours 03/01/13 0353 03/02/13 0952   03/01/13 0600  piperacillin-tazobactam (ZOSYN) IVPB 3.375 g  Status:  Discontinued     3.375 g 12.5 mL/hr over 240 Minutes Intravenous 3 times per day 03/01/13 0353 03/01/13 1245   02/28/13 2300  piperacillin-tazobactam (ZOSYN) IVPB 3.375 g     3.375 g 100 mL/hr over 30 Minutes Intravenous  Once 02/28/13 2249 03/01/13 0007   02/28/13 2300  vancomycin (VANCOCIN) IVPB 1000 mg/200 mL premix     1,000 mg 200 mL/hr over 60 Minutes Intravenous  Once 02/28/13 2249 03/01/13 0104  Time Spent in minutes   20 minutes  Pleas Koch, MD [facility] [phone]   [facility] Office  [phone]    Subjective:   Doing fair. No specific new issues.  Family at bedside says he is tired after having physical therapy today.  Wife seems to understand the difficult situation given inability to anticoagulate despite PE/DVT but has Greenfield filter .  Headaches for patient seemed better   Objective:   Filed Vitals:   03/11/13 0522 03/11/13 0713 03/11/13 0952 03/11/13 1335  BP: 172/83 154/90 171/83 116/57  Pulse: 79  81 78  Temp: 98.5 F (36.9 C)  99.1 F (37.3 C) 97.8 F (36.6 C)  TempSrc: Oral  Oral Oral  Resp: 20  20 20   Height:      Weight:      SpO2: 94%  98% 96%    Wt Readings from Last 3 Encounters:  03/01/13 81.647 kg (180 lb)  03/01/13 81.647 kg (180 lb)     Intake/Output Summary (Last 24 hours) at 03/11/13 1634 Last data filed at 03/11/13 1030  Gross per 24 hour  Intake   3780 ml  Output   3150 ml  Net    630 ml    Exam  General: Well  developed, well nourished, NAD, appears stated age  HEENT: NCAT, Anicteic Sclera, mucous membranes moist.   Neck: Supple, no JVD, no masses  Cardiovascular: S1 S2 auscultated, no rubs, murmurs or gallops. Regular rate and rhythm.  Respiratory: Clear to auscultation bilaterally with equal chest rise  Abdomen: Soft, nontender, nondistended, + bowel sounds Data Review   Micro Results No results found for this or any previous visit (from the past 240 hour(s)).  Radiology Reports Dg Chest 2 View  03/01/2013   CLINICAL DATA:  Fever.  EXAM: CHEST - 2 VIEW  COMPARISON:  02/28/2013  FINDINGS: The heart size and mediastinal contours are within normal limits. There is no evidence of pulmonary edema, consolidation, pneumothorax, nodule or pleural fluid. Stable degenerative changes are seen in the thoracic spine  IMPRESSION: No active disease.   Electronically Signed   By: Irish Lack M.D.   On: 03/01/2013 16:35   Ct Head Wo Contrast  02/28/2013   CLINICAL DATA:  Altered mental status.  Stroke.  EXAM: CT HEAD WITHOUT CONTRAST  TECHNIQUE: Contiguous axial images were obtained from the base of the skull through the vertex without intravenous contrast.  COMPARISON:  None.  FINDINGS: Skull and Sinuses:No significant abnormality.  Orbits: Bilateral cataract resection.  Brain: Acute hemorrhage in the posterior right cerebral hemisphere, located in the posterior temporal and occipital regions. The hematoma is irregularly shaped, but the entire area of abnormality measures 7cm in length, 3 cm in width. There is a moderate amount of surrounding cerebral edema. Minimal intraventricular extension into the right lateral ventricle. No herniation, midline shift, or hydrocephalus.  Chronic small vessel ischemic white matter disease with patchy bilateral cerebral white matter low density. Extensive intracranial atherosclerotic calcification.  Critical Value/emergent results were called by telephone at the time of  interpretation on 02/28/2013 at 11:13 PM to Dr.JOSHUA ZAVITZ , who verbally acknowledged these results.  IMPRESSION: 1. Right temporal/occipital parenchymal hematoma, measuring up to 7 cm in length. Trace intraventricular extension; no hydrocephalus. 2. Chronic small vessel ischemic white matter disease.   Electronically Signed   By: Tiburcio Pea M.D.   On: 02/28/2013 23:16   Dg Chest Portable 1 View  02/28/2013   CLINICAL DATA:  Altered mental  status.  EXAM: PORTABLE CHEST - 1 VIEW  COMPARISON:  None.  FINDINGS: The heart size and mediastinal contours are within normal limits. Both lungs are clear. The visualized skeletal structures are unremarkable.  IMPRESSION: No active disease.   Electronically Signed   By: Tiburcio Pea M.D.   On: 02/28/2013 23:03    CBC  Recent Labs Lab 03/05/13 0945 03/06/13 0555 03/07/13 0550 03/08/13 0610  WBC 13.7* 16.1* 13.0* 13.8*  HGB 13.2 13.3 12.3* 13.2  HCT 37.9* 38.0* 34.8* 37.5*  PLT 213 238 240 239  MCV 91.5 91.3 90.4 90.8  MCH 31.9 32.0 31.9 32.0  MCHC 34.8 35.0 35.3 35.2  RDW 13.5 13.4 13.6 13.8    Chemistries   Recent Labs Lab 03/06/13 0555 03/07/13 0550 03/08/13 0610 03/09/13 0952  NA 129* 131* 135 134*  K 3.1* 3.5 3.5 3.9  CL 97 99 101 101  CO2 20 21 20 22   GLUCOSE 100* 97 103* 134*  BUN 10 13 19 16   CREATININE 0.68 0.69 1.29 0.76  CALCIUM 8.3* 8.3* 8.5 8.7  MG 2.0  --   --   --   AST  --   --   --  39*  ALT  --   --   --  31  ALKPHOS  --   --   --  109  BILITOT  --   --   --  0.6   ------------------------------------------------------------------------------------------------------------------ estimated creatinine clearance is 78.4 ml/min (by C-G formula based on Cr of 0.76). ------------------------------------------------------------------------------------------------------------------ No results found for this basename: HGBA1C,  in the last 72  hours ------------------------------------------------------------------------------------------------------------------ No results found for this basename: CHOL, HDL, LDLCALC, TRIG, CHOLHDL, LDLDIRECT,  in the last 72 hours ------------------------------------------------------------------------------------------------------------------ No results found for this basename: TSH, T4TOTAL, FREET3, T3FREE, THYROIDAB,  in the last 72 hours ------------------------------------------------------------------------------------------------------------------ No results found for this basename: VITAMINB12, FOLATE, FERRITIN, TIBC, IRON, RETICCTPCT,  in the last 72 hours  Coagulation profile No results found for this basename: INR, PROTIME,  in the last 168 hours  No results found for this basename: DDIMER,  in the last 72 hours  Cardiac Enzymes No results found for this basename: CK, CKMB, TROPONINI, MYOGLOBIN,  in the last 168 hours ------------------------------------------------------------------------------------------------------------------ No components found with this basename: POCBNP,

## 2013-03-11 NOTE — Progress Notes (Signed)
Pt with large loose BM. Per family patient has had several loose stools over the past few days. Will send for cdiff per protocol. Enteric precautions initiated. Family and patient educated. Will monitor.

## 2013-03-11 NOTE — Progress Notes (Signed)
Physical Therapy Treatment Patient Details Name: Walter Larsen MRN: [id number] DOB: 1935/06/27 Today's Date: 03/11/2013 Time: 2130-8657 PT Time Calculation (min): 39 min  PT Assessment / Plan / Recommendation  History of Present Illness Walter Larsen is a 77 y.o. male with Past medical history of hypertension, dyslipidemia, recent admission to [facility] with hemorrhagic stroke. The patient is presenting with complaints of altered mental status. He was brought in by his family. As per the family since last one week he had significant headache, after which he was taken to [facility] for hemorrhagic stroke. From where he was discharged day before yesterday. Today he has been having hallucinations and confabulation. He was talking about basketball match but there was none. He also complained of some further frontal headache and some nausea. There were episodes of staring during which he would take a while to respond. And also he had an episode of urinary incontinence. By the EMS arrived he was found to be febrile as well.   PT Comments   Pt improving with mobility today with increasing carry-over of cues throughout tx.  +2A for safety; to assist with chair/IV pole. Pt required assistance with functional bathroom tasks.  Pt will benefit from CIR to increase functional independence and improve mobility prior to d/c home. Family present throughout tx and extremely supportive of pt.    Follow Up Recommendations  CIR     Does the patient have the potential to tolerate intense rehabilitation     Barriers to Discharge        Equipment Recommendations       Recommendations for Other Services Rehab consult  Frequency Min 3X/week   Progress towards PT Goals Progress towards PT goals: Progressing toward goals  Plan Current plan remains appropriate    Precautions / Restrictions Precautions Precautions: Fall Restrictions Weight Bearing Restrictions: No   Pertinent Vitals/Pain no  apparent distress     Mobility  Bed Mobility Bed Mobility: Sitting - Scoot to Edge of Bed;Supine to Sit Supine to Sit: 3: Mod assist Sitting - Scoot to Edge of Bed: 4: Min assist Details for Bed Mobility Assistance: Assist to elevate trunk and to come to sitting Transfers Transfers: Sit to Stand;Stand to Sit Sit to Stand: 4: Min guard;With upper extremity assist;From bed;From chair/3-in-1 Stand to Sit: With upper extremity assist;To bed;To chair/3-in-1;4: Min assist Details for Transfer Assistance: max vc's with each sit<>stand x4 for hand placement with increasing carryover with each rep, from bed, 3n1, recliner.  Min assist to stabilize RW and due to instability and poor balance; no LOB.   Ambulation/Gait Ambulation/Gait Assistance: 4: Min assist Ambulation Distance (Feet): 40 Feet Assistive device: Rolling walker Ambulation/Gait Assistance Details: min Assist today for use of RW, to maintain balance and for stability.  Cues for longer step length, erect posture Gait Pattern: Step-through pattern;Decreased stride length;Shuffle;Trunk flexed    Exercises     PT Diagnosis:    PT Problem List:   PT Treatment Interventions:     PT Goals (current goals can now be found in the care plan section)    Visit Information  Last PT Received On: 03/11/13 Assistance Needed: +2 (for safety) History of Present Illness: Walter Larsen is a 77 y.o. male with Past medical history of hypertension, dyslipidemia, recent admission to [facility] with hemorrhagic stroke. The patient is presenting with complaints of altered mental status. He was brought in by his family. As per the family since last one week he had significant headache,  after which he was taken to [facility] for hemorrhagic stroke. From where he was discharged day before yesterday. Today he has been having hallucinations and confabulation. He was talking about basketball match but there was none. He also complained of some  further frontal headache and some nausea. There were episodes of staring during which he would take a while to respond. And also he had an episode of urinary incontinence. By the EMS arrived he was found to be febrile as well.    Subjective Data      Cognition  Cognition Arousal/Alertness: Awake/alert Behavior During Therapy: Flat affect Overall Cognitive Status: Within Functional Limits for tasks assessed Area of Impairment: Attention;Following commands;Problem solving Current Attention Level: Selective Following Commands: Follows one step commands with increased time Problem Solving: Slow processing;Decreased initiation;Difficulty sequencing;Requires verbal cues;Requires tactile cues    Balance     End of Session PT - End of Session Equipment Utilized During Treatment: Gait belt Activity Tolerance: Patient limited by fatigue Patient left: with call bell/phone within reach;with family/visitor present;in chair Nurse Communication: Mobility status (Pt left in chair)   GP     Byran Bilotti, SPTA 03/11/2013, 2:16 PM

## 2013-03-12 LAB — CBC WITH DIFFERENTIAL/PLATELET
Basophils Absolute: 0.1 10*3/uL (ref 0.0–0.1)
Basophils Relative: 1 % (ref 0–1)
Eosinophils Absolute: 0.2 10*3/uL (ref 0.0–0.7)
Eosinophils Relative: 2 % (ref 0–5)
HCT: 35.2 % — ABNORMAL LOW (ref 39.0–52.0)
Hemoglobin: 12.2 g/dL — ABNORMAL LOW (ref 13.0–17.0)
Lymphocytes Relative: 12 % (ref 12–46)
Lymphs Abs: 1.3 10*3/uL (ref 0.7–4.0)
MCH: 32 pg (ref 26.0–34.0)
MCHC: 34.7 g/dL (ref 30.0–36.0)
MCV: 92.4 fL (ref 78.0–100.0)
Monocytes Absolute: 1.1 10*3/uL — ABNORMAL HIGH (ref 0.1–1.0)
Monocytes Relative: 10 % (ref 3–12)
Neutro Abs: 7.8 10*3/uL — ABNORMAL HIGH (ref 1.7–7.7)
Neutrophils Relative %: 75 % (ref 43–77)
Platelets: 215 10*3/uL (ref 150–400)
RBC: 3.81 MIL/uL — ABNORMAL LOW (ref 4.22–5.81)
RDW: 14.1 % (ref 11.5–15.5)
WBC: 10.4 10*3/uL (ref 4.0–10.5)

## 2013-03-12 LAB — BASIC METABOLIC PANEL
BUN: 8 mg/dL (ref 6–23)
CO2: 23 mEq/L (ref 19–32)
Calcium: 8.3 mg/dL — ABNORMAL LOW (ref 8.4–10.5)
Chloride: 105 mEq/L (ref 96–112)
Creatinine, Ser: 0.64 mg/dL (ref 0.50–1.35)
GFR calc Af Amer: >90 mL/min (ref >90)
GFR calc non Af Amer: >90 mL/min (ref >90)
Glucose, Bld: 88 mg/dL (ref 70–99)
Potassium: 3.8 mEq/L (ref 3.5–5.1)
Sodium: 137 mEq/L (ref 135–145)

## 2013-03-12 MED ORDER — CYCLOBENZAPRINE HCL 5 MG PO TABS
5.0000 mg | ORAL_TABLET | Freq: Every day | ORAL | Status: DC
  Administered 2013-03-13 – 2013-03-14 (×2): 5 mg via ORAL
  Filled 2013-03-12 (×3): qty 1

## 2013-03-12 NOTE — Progress Notes (Signed)
Patient is oriented when asked orientation questions, able to follow commands, and answers questions appropriately. Pt does, however, continue to say strange things that are not appropriate with the particular conversation, but is easily reoriented. Will monitor.

## 2013-03-12 NOTE — Progress Notes (Signed)
[facility]                                                                                Patient Demographics  Walter Larsen, is a 77 y.o. male, DOB - [birth year], UJW:[id number]  Admit date - 02/28/2013   Admitting Physician Lynden Oxford, MD  Outpatient Primary MD for the patient is Provider Not In System  LOS - 12   Chief Complaint  Patient presents with  . Altered Mental Status      Interim history This is a 77 year old past medical history of hypertension, dyslipidemia, recent admission was for showing hemorrhagic stroke. Patient was discharged with home and was found to have altered mental status with fevers. He was brought into the emergency department here at [facility] with hallucinations and confabulation. Patient continues to have fevers. Neurosurgery cells ID and neurology been consulted and following patient. Infectious disease workup including a urinalysis chest x-ray MRI of the head to rule out abscess have all been conducted with no source of infection at this time. Patient has been on broad-spectrum antibiotics for quite some time. Patient had MRI conducted showing no abscess. TEE was also conducted showing no endocarditis. Antibiotics were discontinued 11/25. However in workup for his fevers, he was found to have extensive PEs with moderate right heart strain and right lower extremity DVT Care situation as recent hemorrhagic stroke therefore cannot anticoagulate and [place] filter placed and emergently 11/25 Patient has progressively done well  Assessment & Plan   Principal Problem:   Fever, unspecified Active Problems:   Hemorrhagic stroke   Essential hypertension, benign   Other and unspecified hyperlipidemia   Bradycardia   Hypokalemia   Dry eyes   Hyponatremia   Headache   Acute encephalopathy   Pulmonary emboli  Fever -Unknown etiology, may be secondary to Recent Temporal/occiptal hematoma -Urine analysis negative -Urine and blood cultures shows  no growth at this time.  -Patient has been on empiric antibiotic coverage with vancomycin and ceftrixone, flagyl.  Infectious disease input appreciated discontinued antibiotics 11/25 -ID and neurosurgery consulted and following -MRI suggests emboli -TEE showed no obvious vegetation, no thrombus, no PFO  Pulmonary emboli as well as right lower extremity DVT-undetermined age  green field filter placed 11/25 emergently back radiology  precarious situation as cannot anticoagulate given recent hemorrhagic transformation of hematoma Goals of care have been discussed with the patient and the family on 11/28. Understand precarious situation. Will further delineate in the a.m.-patient for now is  Full code Repeat discussion 11/29 has yielded that patient see family wants me to talk to her son- i did discuss this with him 11/29 and family will dscuss the same  Hypokalemia -K  3.5, will continue to monitor-labs ordered for a.m.  -Magnesium 2  Right temporal/occipital parenchymal hematoma -Neurosurgery and neurology consulted and following.   -CT Scan: Right temporal/occipital parenchymal hematoma, measuring up to 7 cm in length. Trace intraventricular extension; no hydrocephalus. -ASA held  Encephalopathy/ AMS -Resolved, no longer altered, at baseline -Neurology following  -EEG: This is a normal EEG recording during sleep. No evidence of an epileptic disorder was seen.  Bradycardia -Resovled now has only PVCs, discontinue telemetry  HTN  -Continue metoprolol 50mg  BID. Continue hydralazine PRN.  BPH -Increase flomax 0.4--0.8 11/27 -Foley d/c 11.26  Hyperlipidemia -Continue atorvastatin   Hyponatremia -Improving, Na 135, will continue IVF.  May be secondary to insensible losses.  Headaches -Continue depakote (for headache and possible seizure)  Dry Eyes Continue artificial tears.  Code Status: Full  Family Communication: Wife at bedside  Disposition Plan: Admitted-possible  transfer to inpatient rehabilitation if continues to do well  Procedures EEG: This is a normal EEG recording during sleep. No evidence of an epileptic disorder was seen.  MRI IMPRESSION:  Large subacute hematoma right temporoparietal lobe. Superimposed abscess not considered likely.  Small areas of acute infarct in the right occipital parietal lobe and left temporoparietal lobe, question emboli.  TEE Impressions: No vegetations present.  Consults   Neurology Neurosurgery Infectious Disease Cardiology  DVT Prophylaxis  SCDs  Lab Results  Component Value Date   PLT 215 03/12/2013    Medications  Scheduled Meds: . amLODipine  5 mg Oral Daily  . atorvastatin  20 mg Oral QHS  . divalproex  500 mg Oral Daily  . feeding supplement (ENSURE COMPLETE)  237 mL Oral BID BM  . LORazepam  1 mg Intravenous Once  . metoprolol tartrate  50 mg Oral BID  . pantoprazole  40 mg Oral Daily  . tamsulosin  0.4 mg Oral QPC breakfast   Continuous Infusions:   PRN Meds:.acetaminophen, acetaminophen, cyclobenzaprine, hydrALAZINE, ondansetron (ZOFRAN) IV, polyvinyl alcohol  Antibiotics    Anti-infectives   Start     Dose/Rate Route Frequency Ordered Stop   03/05/13 0800  vancomycin (VANCOCIN) 1,250 mg in sodium chloride 0.9 % 250 mL IVPB  Status:  Discontinued     1,250 mg 166.7 mL/hr over 90 Minutes Intravenous Every 8 hours 03/05/13 0225 03/07/13 2041   03/03/13 0300  cefTRIAXone (ROCEPHIN) 2 g in dextrose 5 % 50 mL IVPB  Status:  Discontinued     2 g 100 mL/hr over 30 Minutes Intravenous Every 12 hours 03/02/13 1453 03/07/13 2041   03/02/13 1300  cefTRIAXone (ROCEPHIN) 2 g in dextrose 5 % 50 mL IVPB  Status:  Discontinued     2 g 100 mL/hr over 30 Minutes Intravenous Every 24 hours 03/02/13 1241 03/02/13 1453   03/02/13 1300  metroNIDAZOLE (FLAGYL) IVPB 500 mg  Status:  Discontinued     500 mg 100 mL/hr over 60 Minutes Intravenous Every 6 hours 03/02/13 1241 03/07/13 0914   03/02/13  1300  vancomycin (VANCOCIN) IVPB 1000 mg/200 mL premix  Status:  Discontinued     1,000 mg 200 mL/hr over 60 Minutes Intravenous Every 12 hours 03/02/13 1252 03/05/13 0224   03/01/13 1400  cefTRIAXone (ROCEPHIN) 2 g in dextrose 5 % 50 mL IVPB  Status:  Discontinued     2 g 100 mL/hr over 30 Minutes Intravenous Every 24 hours 03/01/13 1245 03/02/13 0952   03/01/13 1200  vancomycin (VANCOCIN) IVPB 1000 mg/200 mL premix  Status:  Discontinued     1,000 mg 200 mL/hr over 60 Minutes Intravenous Every 12 hours 03/01/13 0353 03/02/13 0952   03/01/13 0600  piperacillin-tazobactam (ZOSYN) IVPB 3.375 g  Status:  Discontinued     3.375 g 12.5 mL/hr over 240 Minutes Intravenous 3 times per day 03/01/13 0353 03/01/13 1245   02/28/13 2300  piperacillin-tazobactam (ZOSYN) IVPB 3.375 g     3.375 g 100 mL/hr over 30 Minutes Intravenous  Once 02/28/13 2249 03/01/13 0007   02/28/13 2300  vancomycin (  VANCOCIN) IVPB 1000 mg/200 mL premix     1,000 mg 200 mL/hr over 60 Minutes Intravenous  Once 02/28/13 2249 03/01/13 0104       Time Spent in minutes   20 minutes  Pleas Koch, MD [facility] [phone]   [facility] Office  [phone]    Subjective:   Doing fair. Sleepy No other issues currently Noted low grade temp  Objective:   Filed Vitals:   03/12/13 0327 03/12/13 0602 03/12/13 0757 03/12/13 1320  BP: 144/70 123/52 150/68 119/56  Pulse: 77 71 87 77  Temp:  98.6 F (37 C) 98.3 F (36.8 C) 99.5 F (37.5 C)  TempSrc:  Oral Axillary Oral  Resp:  18 18 20   Height:      Weight:      SpO2:  96% 100% 97%    Wt Readings from Last 3 Encounters:  03/01/13 81.647 kg (180 lb)  03/01/13 81.647 kg (180 lb)     Intake/Output Summary (Last 24 hours) at 03/12/13 1544 Last data filed at 03/12/13 1321  Gross per 24 hour  Intake    720 ml  Output   2850 ml  Net  -2130 ml    Exam  General: Well developed, well nourished, NAD, appears stated age  HEENT: NCAT,  Anicteic Sclera, mucous membranes moist.   Neck: Supple, no JVD, no masses  Cardiovascular: S1 S2 auscultated, no rubs, murmurs or gallops. Regular rate and rhythm.  Respiratory: Clear to auscultation bilaterally with equal chest rise  Abdomen: Soft, nontender, nondistended, + bowel sounds Data Review   Micro Results Recent Results (from the past 240 hour(s))  CLOSTRIDIUM DIFFICILE BY PCR     Status: None   Collection Time    03/11/13  9:30 PM      Result Value Range Status   C difficile by pcr NEGATIVE  NEGATIVE Final    Radiology Reports Dg Chest 2 View  03/01/2013   CLINICAL DATA:  Fever.  EXAM: CHEST - 2 VIEW  COMPARISON:  02/28/2013  FINDINGS: The heart size and mediastinal contours are within normal limits. There is no evidence of pulmonary edema, consolidation, pneumothorax, nodule or pleural fluid. Stable degenerative changes are seen in the thoracic spine  IMPRESSION: No active disease.   Electronically Signed   By: Irish Lack M.D.   On: 03/01/2013 16:35   Ct Head Wo Contrast  02/28/2013   CLINICAL DATA:  Altered mental status.  Stroke.  EXAM: CT HEAD WITHOUT CONTRAST  TECHNIQUE: Contiguous axial images were obtained from the base of the skull through the vertex without intravenous contrast.  COMPARISON:  None.  FINDINGS: Skull and Sinuses:No significant abnormality.  Orbits: Bilateral cataract resection.  Brain: Acute hemorrhage in the posterior right cerebral hemisphere, located in the posterior temporal and occipital regions. The hematoma is irregularly shaped, but the entire area of abnormality measures 7cm in length, 3 cm in width. There is a moderate amount of surrounding cerebral edema. Minimal intraventricular extension into the right lateral ventricle. No herniation, midline shift, or hydrocephalus.  Chronic small vessel ischemic white matter disease with patchy bilateral cerebral white matter low density. Extensive intracranial atherosclerotic calcification.   Critical Value/emergent results were called by telephone at the time of interpretation on 02/28/2013 at 11:13 PM to Dr.JOSHUA ZAVITZ , who verbally acknowledged these results.  IMPRESSION: 1. Right temporal/occipital parenchymal hematoma, measuring up to 7 cm in length. Trace intraventricular extension; no hydrocephalus. 2. Chronic small vessel ischemic white matter disease.  Electronically Signed   By: Tiburcio Pea M.D.   On: 02/28/2013 23:16   Dg Chest Portable 1 View  02/28/2013   CLINICAL DATA:  Altered mental status.  EXAM: PORTABLE CHEST - 1 VIEW  COMPARISON:  None.  FINDINGS: The heart size and mediastinal contours are within normal limits. Both lungs are clear. The visualized skeletal structures are unremarkable.  IMPRESSION: No active disease.   Electronically Signed   By: Tiburcio Pea M.D.   On: 02/28/2013 23:03    CBC  Recent Labs Lab 03/06/13 0555 03/07/13 0550 03/08/13 0610 03/12/13 0605  WBC 16.1* 13.0* 13.8* 10.4  HGB 13.3 12.3* 13.2 12.2*  HCT 38.0* 34.8* 37.5* 35.2*  PLT 238 240 239 215  MCV 91.3 90.4 90.8 92.4  MCH 32.0 31.9 32.0 32.0  MCHC 35.0 35.3 35.2 34.7  RDW 13.4 13.6 13.8 14.1  LYMPHSABS  --   --   --  1.3  MONOABS  --   --   --  1.1*  EOSABS  --   --   --  0.2  BASOSABS  --   --   --  0.1    Chemistries   Recent Labs Lab 03/06/13 0555 03/07/13 0550 03/08/13 0610 03/09/13 0952 03/12/13 0605  NA 129* 131* 135 134* 137  K 3.1* 3.5 3.5 3.9 3.8  CL 97 99 101 101 105  CO2 20 21 20 22 23   GLUCOSE 100* 97 103* 134* 88  BUN 10 13 19 16 8   CREATININE 0.68 0.69 1.29 0.76 0.64  CALCIUM 8.3* 8.3* 8.5 8.7 8.3*  MG 2.0  --   --   --   --   AST  --   --   --  39*  --   ALT  --   --   --  31  --   ALKPHOS  --   --   --  109  --   BILITOT  --   --   --  0.6  --    ------------------------------------------------------------------------------------------------------------------ estimated creatinine clearance is 78.4 ml/min (by C-G formula based on  Cr of 0.64). ------------------------------------------------------------------------------------------------------------------ No results found for this basename: HGBA1C,  in the last 72 hours ------------------------------------------------------------------------------------------------------------------ No results found for this basename: CHOL, HDL, LDLCALC, TRIG, CHOLHDL, LDLDIRECT,  in the last 72 hours ------------------------------------------------------------------------------------------------------------------ No results found for this basename: TSH, T4TOTAL, FREET3, T3FREE, THYROIDAB,  in the last 72 hours ------------------------------------------------------------------------------------------------------------------ No results found for this basename: VITAMINB12, FOLATE, FERRITIN, TIBC, IRON, RETICCTPCT,  in the last 72 hours  Coagulation profile No results found for this basename: INR, PROTIME,  in the last 168 hours  No results found for this basename: DDIMER,  in the last 72 hours  Cardiac Enzymes No results found for this basename: CK, CKMB, TROPONINI, MYOGLOBIN,  in the last 168 hours ------------------------------------------------------------------------------------------------------------------ No components found with this basename: POCBNP,

## 2013-03-13 NOTE — Progress Notes (Signed)
[facility]                                                                                Patient Demographics  Walter Larsen, is a 77 y.o. male, DOB - 04/14/1936, ZOX:[id number]  Admit date - 02/28/2013   Admitting Physician Lynden Oxford, MD  Outpatient Primary MD for the patient is Provider Not In System  LOS - 13   Chief Complaint  Patient presents with  . Altered Mental Status      Interim history This is a 77 year old past medical history of hypertension, dyslipidemia, recent admission was for showing hemorrhagic stroke. Patient was discharged with home and was found to have altered mental status with fevers. He was brought into the emergency department here at [facility] with hallucinations and confabulation. Patient continues to have fevers. Neurosurgery cells ID and neurology been consulted and following patient. Infectious disease workup including a urinalysis chest x-ray MRI of the head to rule out abscess have all been conducted with no source of infection at this time. Patient has been on broad-spectrum antibiotics for quite some time. Patient had MRI conducted showing no abscess. TEE was also conducted showing no endocarditis. Antibiotics were discontinued 11/25. However in workup for his fevers, he was found to have extensive PEs with moderate right heart strain and right lower extremity DVT Care situation as recent hemorrhagic stroke therefore cannot anticoagulate and Green[place] filter placed and emergently 11/25 Patient has progressively done well  Assessment & Plan   Principal Problem:   Fever, unspecified Active Problems:   Hemorrhagic stroke   Essential hypertension, benign   Other and unspecified hyperlipidemia   Bradycardia   Hypokalemia   Dry eyes   Hyponatremia   Headache   Acute encephalopathy   Pulmonary emboli  Fever -Unknown etiology, may be secondary to Recent Temporal/occiptal hematoma -Urine analysis negative -Urine and blood cultures shows  no growth at this time.  -Patient has been on empiric antibiotic coverage with vancomycin and ceftrixone, flagyl.  Infectious disease input appreciated discontinued antibiotics 11/25 -ID and neurosurgery consulted and following -MRI suggests emboli -TEE showed no obvious vegetation, no thrombus, no PFO  Pulmonary emboli as well as right lower extremity DVT-undetermined age  green field filter placed 11/25 emergently by radiology  precarious situation as cannot anticoagulate given recent hemorrhagic transformation of hematoma Goals of care have been discussed with the patient and the family on 11/28. Understand precarious situation. Will further delineate in the a.m.-patient for now is  Full code Repeat discussion 11/29 has yielded that patient see family wants me to talk to her son- i did discuss this with him 11/29 and family will discuss the same  Hypokalemia -K  3.5, will continue to monitor-labs ordered for a.m.  -Magnesium 2  Right temporal/occipital parenchymal hematoma -Neurosurgery and neurology consulted and following.   -CT Scan: Right temporal/occipital parenchymal hematoma, measuring up to 7 cm in length. Trace intraventricular extension; no hydrocephalus. -ASA held  Encephalopathy/ AMS -Resolved, no longer altered, at baseline -Neurology following  -EEG: This is a normal EEG recording during sleep. No evidence of an epileptic disorder was seen.  Bradycardia -Resovled now has only PVCs, discontinue telemetry  HTN  -Continue metoprolol 50mg  BID. Continue hydralazine PRN.  BPH -Increase flomax 0.4--0.8 11/27 -Foley d/c 11.26  Hyperlipidemia -Continue atorvastatin   Hyponatremia -Improving, Na 135, will continue IVF.  May be secondary to insensible losses.  Headaches -Continue depakote (for headache and possible seizure)  Dry Eyes Continue artificial tears.  Code Status: Full  Family Communication: Wife at bedside  Disposition Plan: Admitted-possible  transfer to inpatient rehabilitation if continues to do well  Procedures EEG: This is a normal EEG recording during sleep. No evidence of an epileptic disorder was seen.  MRI IMPRESSION:  Large subacute hematoma right temporoparietal lobe. Superimposed abscess not considered likely.  Small areas of acute infarct in the right occipital parietal lobe and left temporoparietal lobe, question emboli.  TEE Impressions: No vegetations present.  Consults   Neurology Neurosurgery Infectious Disease Cardiology  DVT Prophylaxis  SCDs  Lab Results  Component Value Date   PLT 215 03/12/2013    Medications  Scheduled Meds: . amLODipine  5 mg Oral Daily  . atorvastatin  20 mg Oral QHS  . cyclobenzaprine  5 mg Oral QHS  . divalproex  500 mg Oral Daily  . feeding supplement (ENSURE COMPLETE)  237 mL Oral BID BM  . LORazepam  1 mg Intravenous Once  . metoprolol tartrate  50 mg Oral BID  . pantoprazole  40 mg Oral Daily  . tamsulosin  0.4 mg Oral QPC breakfast   Continuous Infusions:   PRN Meds:.acetaminophen, acetaminophen, hydrALAZINE, polyvinyl alcohol  Antibiotics    Anti-infectives   Start     Dose/Rate Route Frequency Ordered Stop   03/05/13 0800  vancomycin (VANCOCIN) 1,250 mg in sodium chloride 0.9 % 250 mL IVPB  Status:  Discontinued     1,250 mg 166.7 mL/hr over 90 Minutes Intravenous Every 8 hours 03/05/13 0225 03/07/13 2041   03/03/13 0300  cefTRIAXone (ROCEPHIN) 2 g in dextrose 5 % 50 mL IVPB  Status:  Discontinued     2 g 100 mL/hr over 30 Minutes Intravenous Every 12 hours 03/02/13 1453 03/07/13 2041   03/02/13 1300  cefTRIAXone (ROCEPHIN) 2 g in dextrose 5 % 50 mL IVPB  Status:  Discontinued     2 g 100 mL/hr over 30 Minutes Intravenous Every 24 hours 03/02/13 1241 03/02/13 1453   03/02/13 1300  metroNIDAZOLE (FLAGYL) IVPB 500 mg  Status:  Discontinued     500 mg 100 mL/hr over 60 Minutes Intravenous Every 6 hours 03/02/13 1241 03/07/13 0914   03/02/13 1300   vancomycin (VANCOCIN) IVPB 1000 mg/200 mL premix  Status:  Discontinued     1,000 mg 200 mL/hr over 60 Minutes Intravenous Every 12 hours 03/02/13 1252 03/05/13 0224   03/01/13 1400  cefTRIAXone (ROCEPHIN) 2 g in dextrose 5 % 50 mL IVPB  Status:  Discontinued     2 g 100 mL/hr over 30 Minutes Intravenous Every 24 hours 03/01/13 1245 03/02/13 0952   03/01/13 1200  vancomycin (VANCOCIN) IVPB 1000 mg/200 mL premix  Status:  Discontinued     1,000 mg 200 mL/hr over 60 Minutes Intravenous Every 12 hours 03/01/13 0353 03/02/13 0952   03/01/13 0600  piperacillin-tazobactam (ZOSYN) IVPB 3.375 g  Status:  Discontinued     3.375 g 12.5 mL/hr over 240 Minutes Intravenous 3 times per day 03/01/13 0353 03/01/13 1245   02/28/13 2300  piperacillin-tazobactam (ZOSYN) IVPB 3.375 g     3.375 g 100 mL/hr over 30 Minutes Intravenous  Once 02/28/13 2249 03/01/13 0007  02/28/13 2300  vancomycin (VANCOCIN) IVPB 1000 mg/200 mL premix     1,000 mg 200 mL/hr over 60 Minutes Intravenous  Once 02/28/13 2249 03/01/13 0104       Time Spent in minutes   20 minutes  Pleas Koch, MD [facility] [phone]   [facility] Office  [phone]    Subjective:   Doing fair. Much more oriented and alert and conversant.  Son in the room No other issues currently Noted low grade temp  Objective:   Filed Vitals:   03/13/13 0619 03/13/13 1049 03/13/13 1200 03/13/13 1320  BP: 147/78 168/77 156/48 144/68  Pulse: 81 88 78 86  Temp: 99.1 F (37.3 C) 97.8 F (36.6 C)  97.7 F (36.5 C)  TempSrc: Oral Oral  Oral  Resp: 16 20  20   Height:      Weight:      SpO2: 94% 99%  96%    Wt Readings from Last 3 Encounters:  03/01/13 81.647 kg (180 lb)  03/01/13 81.647 kg (180 lb)     Intake/Output Summary (Last 24 hours) at 03/13/13 1702 Last data filed at 03/13/13 1321  Gross per 24 hour  Intake    480 ml  Output   4075 ml  Net  -3595 ml    Exam  General: Well developed, well  nourished, NAD, appears stated age  HEENT: NCAT, Anicteic Sclera, mucous membranes moist.   Neck: Supple, no JVD, no masses  Cardiovascular: S1 S2 auscultated, no rubs, murmurs or gallops. Regular rate and rhythm.  Respiratory: Clear to auscultation bilaterally with equal chest rise  Abdomen: Soft, nontender, nondistended, + bowel sounds Data Review   Micro Results Recent Results (from the past 240 hour(s))  CLOSTRIDIUM DIFFICILE BY PCR     Status: None   Collection Time    03/11/13  9:30 PM      Result Value Range Status   C difficile by pcr NEGATIVE  NEGATIVE Final    Radiology Reports Dg Chest 2 View  03/01/2013   CLINICAL DATA:  Fever.  EXAM: CHEST - 2 VIEW  COMPARISON:  02/28/2013  FINDINGS: The heart size and mediastinal contours are within normal limits. There is no evidence of pulmonary edema, consolidation, pneumothorax, nodule or pleural fluid. Stable degenerative changes are seen in the thoracic spine  IMPRESSION: No active disease.   Electronically Signed   By: Irish Lack M.D.   On: 03/01/2013 16:35   Ct Head Wo Contrast  02/28/2013   CLINICAL DATA:  Altered mental status.  Stroke.  EXAM: CT HEAD WITHOUT CONTRAST  TECHNIQUE: Contiguous axial images were obtained from the base of the skull through the vertex without intravenous contrast.  COMPARISON:  None.  FINDINGS: Skull and Sinuses:No significant abnormality.  Orbits: Bilateral cataract resection.  Brain: Acute hemorrhage in the posterior right cerebral hemisphere, located in the posterior temporal and occipital regions. The hematoma is irregularly shaped, but the entire area of abnormality measures 7cm in length, 3 cm in width. There is a moderate amount of surrounding cerebral edema. Minimal intraventricular extension into the right lateral ventricle. No herniation, midline shift, or hydrocephalus.  Chronic small vessel ischemic white matter disease with patchy bilateral cerebral white matter low density. Extensive  intracranial atherosclerotic calcification.  Critical Value/emergent results were called by telephone at the time of interpretation on 02/28/2013 at 11:13 PM to Dr.JOSHUA ZAVITZ , who verbally acknowledged these results.  IMPRESSION: 1. Right temporal/occipital parenchymal hematoma, measuring up to 7 cm in  length. Trace intraventricular extension; no hydrocephalus. 2. Chronic small vessel ischemic white matter disease.   Electronically Signed   By: Tiburcio Pea M.D.   On: 02/28/2013 23:16   Dg Chest Portable 1 View  02/28/2013   CLINICAL DATA:  Altered mental status.  EXAM: PORTABLE CHEST - 1 VIEW  COMPARISON:  None.  FINDINGS: The heart size and mediastinal contours are within normal limits. Both lungs are clear. The visualized skeletal structures are unremarkable.  IMPRESSION: No active disease.   Electronically Signed   By: Tiburcio Pea M.D.   On: 02/28/2013 23:03    CBC  Recent Labs Lab 03/07/13 0550 03/08/13 0610 03/12/13 0605  WBC 13.0* 13.8* 10.4  HGB 12.3* 13.2 12.2*  HCT 34.8* 37.5* 35.2*  PLT 240 239 215  MCV 90.4 90.8 92.4  MCH 31.9 32.0 32.0  MCHC 35.3 35.2 34.7  RDW 13.6 13.8 14.1  LYMPHSABS  --   --  1.3  MONOABS  --   --  1.1*  EOSABS  --   --  0.2  BASOSABS  --   --  0.1    Chemistries   Recent Labs Lab 03/07/13 0550 03/08/13 0610 03/09/13 0952 03/12/13 0605  NA 131* 135 134* 137  K 3.5 3.5 3.9 3.8  CL 99 101 101 105  CO2 21 20 22 23   GLUCOSE 97 103* 134* 88  BUN 13 19 16 8   CREATININE 0.69 1.29 0.76 0.64  CALCIUM 8.3* 8.5 8.7 8.3*  AST  --   --  39*  --   ALT  --   --  31  --   ALKPHOS  --   --  109  --   BILITOT  --   --  0.6  --    ------------------------------------------------------------------------------------------------------------------ estimated creatinine clearance is 78.4 ml/min (by C-G formula based on Cr of  0.64). ------------------------------------------------------------------------------------------------------------------ No results found for this basename: HGBA1C,  in the last 72 hours ------------------------------------------------------------------------------------------------------------------ No results found for this basename: CHOL, HDL, LDLCALC, TRIG, CHOLHDL, LDLDIRECT,  in the last 72 hours ------------------------------------------------------------------------------------------------------------------ No results found for this basename: TSH, T4TOTAL, FREET3, T3FREE, THYROIDAB,  in the last 72 hours ------------------------------------------------------------------------------------------------------------------ No results found for this basename: VITAMINB12, FOLATE, FERRITIN, TIBC, IRON, RETICCTPCT,  in the last 72 hours  Coagulation profile No results found for this basename: INR, PROTIME,  in the last 168 hours  No results found for this basename: DDIMER,  in the last 72 hours  Cardiac Enzymes No results found for this basename: CK, CKMB, TROPONINI, MYOGLOBIN,  in the last 168 hours ------------------------------------------------------------------------------------------------------------------ No components found with this basename: POCBNP,

## 2013-03-14 LAB — CRYOGLOBULIN

## 2013-03-14 MED ORDER — DIVALPROEX SODIUM ER 500 MG PO TB24
750.0000 mg | ORAL_TABLET | Freq: Every day | ORAL | Status: DC
  Administered 2013-03-15: 10:00:00 750 mg via ORAL
  Filled 2013-03-14: qty 1

## 2013-03-14 NOTE — PMR Pre-admission (Signed)
PMR Admission Coordinator Pre-Admission Assessment  Patient: Walter Larsen is an 77 y.o., male MRN: [id number] DOB: Jul 24, 1935 Height: 5\' 8"  (172.7 cm) Weight: 81.647 kg (180 lb)              Insurance Information HMO: yes    PPO:      PCP:      IPA:      80/20:      OTHER: medicare replacement policy PRIMARY: A[employer] Medicare      Policy#: [id number]      Subscriber: pt CM Name: Bertram Denver     Phone#: [phone]     Fax#: onsite Pre-Cert#: [id number]      Employer: retired Benefits:  Phone #: [phone]     Name: 11/26 Eff. Date: 11/12/12     Deduct: none      Out of Pocket Max: $4900      Life Max: none CIR: $295 copay per day day 1 thru 5      SNF: $25 copay per day days 1 - 20, $152 per day days 50-100 Outpatient: $45 per visit     Co-Pay: no visit limit Home Health: 100%      Co-Pay: no visit limit DME: 80%     Co-Pay: 20% Providers: in network  SECONDARY: none  Emergency Contact Information Contact Information   Name Relation Home Work Mobile   Reinig,Linda Spouse [phone]  [phone]   Caylor, [place] [phone]       Current Medical History  Patient Admitting Diagnosis: Right temporoparietal intracranial hemorrhage as well as right occipital infarct with left hemiparesis and left homonymous hemianopsia  History of Present Illness:Walter Larsen is a 77 y.o. right-handed male with history of hypertension, CAD with PTCA. Admit 03/01/2013 with altered mental status and fever. Recent admission to [facility] with hemorrhagic stroke and discharged 02/27/2013 after a three-day hospital stay. Presented with increased headache and nausea as well as hallucinations and altered mental status. Cranial CT scan showed right temporal occipital parenchymal hematoma measuring up to 7 cm in light of trace intraventricular extension without hydrocephalus. Patient was placed on vancomycin /Zosyn for fever of 100.3 white blood cell count 15,000. An MRI was completed 03/05/2013 during workup of fever  rule out abscess that showed a large subacute hematoma right temporoparietal lobe as well small areas of acute infarct in the right occipital parietal lobe and left temporal parietal lobe. TEE completed showing no thrombus without PFO no vegetation. Infectious disease consult and for fever of unknown origin with workup ongoing. CT of the 03/08/2013 showed bilateral lobar and segmental pulmonary emboli. Underwent removal Greenfield filter 03/08/2012 per interventional radiology Depakote was added for headache as well as seizure prevention. Patient is on a regular consistency diet. Physical therapy evaluation completed 03/08/2013 with recommendations of physical medicine rehabilitation consult to consider inpatient rehabilitation services.  Patient has poor appetite, food this and takes good but denies any choking. Very active prior to admission.  Total: 4 NIH    Past Medical History  Past Medical History  Diagnosis Date  . Stroke   . Coronary artery disease     3 stents    Family History  family history includes Hypertension in his father and mother.  Prior Rehab/Hospitalizations: none   Current Medications  Current facility-administered medications:acetaminophen (TYLENOL) suppository 650 mg, 650 mg, Rectal, Q4H PRN, Maryann Mikhail, DO, 650 mg at 03/07/13 1300;  acetaminophen (TYLENOL) tablet 650 mg, 650 mg, Oral, Q6H PRN, Belkys A Regalado, MD, 650 mg at 03/15/13  4782;  amLODipine (NORVASC) tablet 5 mg, 5 mg, Oral, Daily, Rhetta Mura, MD, 5 mg at 03/14/13 1006 atorvastatin (LIPITOR) tablet 20 mg, 20 mg, Oral, QHS, Lynden Oxford, MD, 20 mg at 03/14/13 2158;  cyclobenzaprine (FLEXERIL) tablet 5 mg, 5 mg, Oral, QHS, Rhetta Mura, MD, 5 mg at 03/14/13 2158;  divalproex (DEPAKOTE ER) 24 hr tablet 750 mg, 750 mg, Oral, Daily, Rhetta Mura, MD, 750 mg at 03/15/13 1023 feeding supplement (ENSURE COMPLETE) (ENSURE COMPLETE) liquid 237 mL, 237 mL, Oral, BID BM, Ailene Ards, RD, 237 mL at 03/15/13 1024;  hydrALAZINE (APRESOLINE) injection 10 mg, 10 mg, Intravenous, Q6H PRN, Belkys A Regalado, MD, 10 mg at 03/13/13 1108;  LORazepam (ATIVAN) injection 1 mg, 1 mg, Intravenous, Once, [employer], DO;  metoprolol (LOPRESSOR) tablet 50 mg, 50 mg, Oral, BID, Maryann Mikhail, DO, 50 mg at 03/15/13 1023 pantoprazole (PROTONIX) EC tablet 40 mg, 40 mg, Oral, Daily, Lynden Oxford, MD, 40 mg at 03/15/13 1023;  polyvinyl alcohol (LIQUIFILM TEARS) 1.4 % ophthalmic solution 2 drop, 2 drop, Both Eyes, PRN, Dorothea Ogle, MD, 2 drop at 03/13/13 2142;  tamsulosin (FLOMAX) capsule 0.4 mg, 0.4 mg, Oral, QPC breakfast, Rhetta Mura, MD, 0.4 mg at 03/15/13 9562  Patients Current Diet: Cardiac regular  Precautions / Restrictions Precautions Precautions: Fall Restrictions Weight Bearing Restrictions: No   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, 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 Assistive Devices/Equipment: None Home Equipment: None  Prior Functional Level Prior Function Level of Independence: Independent  Current Functional Level Cognition  Overall Cognitive Status: Impaired/Different from baseline Current Attention Level: Selective Orientation Level: Oriented to person;Oriented to place Following Commands: Follows one step commands inconsistently    Extremity Assessment (includes Sensation/Coordination)          ADLs  Eating/Feeding: Independent Where Assessed - Eating/Feeding: Chair Grooming: Teeth care;Wash/dry face;Brushing hair;Minimal assistance Where Assessed - Grooming: Supported standing;Supported sitting Upper Body Bathing: Minimal assistance Where Assessed - Upper Body Bathing: Supported sitting Lower Body Bathing: +2 Total assistance Lower Body Bathing: Patient Percentage: 20% Where Assessed - Lower Body Bathing: Supported sit to stand Upper Body Dressing: Minimal assistance Where Assessed - Upper  Body Dressing: Supported sitting Lower Body Dressing: +2 Total assistance Lower Body Dressing: Patient Percentage: 10% Where Assessed - Lower Body Dressing: Supported sit to Pharmacist, hospital: Min guard;Moderate assistance (Min guard/Mod A-sit to stand; +2 Total A for stand pivot) Toilet Transfer: Patient Percentage: 50% Toilet Transfer Method: Sit to stand;Stand pivot Acupuncturist: Bedside commode Toileting - Clothing Manipulation and Hygiene: +2 Total assistance Toileting - Clothing Manipulation and Hygiene: Patient Percentage: 10% Where Assessed - Toileting Clothing Manipulation and Hygiene: Sit to stand from 3-in-1 or toilet Tub/Shower Transfer Method: Not assessed Equipment Used: Gait belt;Rolling walker Transfers/Ambulation Related to ADLs: Min guard ambulating to bathroom at beginning of session; +2 Total A for safety to stand pivot back to recliner chair from [facility] as pt was feeling dizzy. Pt performed stand pivot later in session at [facility] guard level.  ADL Comments: Pt stood at sink to perform grooming tasks. Pt had very difficult time locating items on left side of sink. OT had to physically move pt's head to left side due to him not responding to cues. Educated family to sit on left side and also to present things to him on left to force him to look that way so he can learn to compensate. Educated pt on energy conservation as  he was fatigued during grooming tasks and also educated family as well. Educated on chair push ups to increase pt's strength in upper extremities.     Mobility  Bed Mobility: Right Sidelying to Sit;Sitting - Scoot to [place] of Bed Rolling Right: 3: Mod assist;4: Min guard (Min guard rolling to get out of bed) Rolling Left: 3: Mod assist Right Sidelying to Sit: With rails;4: Min guard Left Sidelying to Sit: 1: +2 Total assist;HOB flat Left Sidelying to Sit: Patient Percentage: 20% Supine to Sit: 4: Min guard;HOB elevated Sitting - Scoot to Edge of  Bed: With rail;4: Min guard Sit to Sidelying Right: 3: Mod assist Sit to Sidelying Left: 2: Max assist Sit to Sidelying Left: Patient Percentage: 20%    Transfers  Transfers: Sit to Stand;Stand to Sit Sit to Stand: 4: Min guard;With upper extremity assist;From bed Sit to Stand: Patient Percentage: 40% Stand to Sit: To chair/3-in-1;4: Min guard;With upper extremity assist Stand to Sit: Patient Percentage: 40% Stand Pivot Transfers: 1: +2 Total assist Stand Pivot Transfers: Patient Percentage: 50%    Ambulation / Gait / Stairs / Wheelchair Mobility  Ambulation/Gait Ambulation/Gait Assistance: 4: Min guard Ambulation/Gait: Patient Percentage: 60% Ambulation Distance (Feet): 70 Feet Assistive device: Rolling walker Ambulation/Gait Assistance Details: Pt ambulated 50' and required seated rest break due to LBP. Pt able to ambulate 20' prior to needing 2nd rest break. Pt becomes unsteady  and increases distance between himself and RW despite cues due to LBP. Pt requires cues to take seated rest break instead of continuing when becoming usafe.  Gait Pattern: Step-through pattern;Decreased stride length;Trunk flexed Gait velocity: decreased    Posture / Balance Static Sitting Balance Static Sitting - Balance Support: Feet supported Static Sitting - Level of Assistance: 5: Stand by assistance Static Sitting - Comment/# of Minutes: 12 minutes on BSC Static Standing Balance Static Standing - Balance Support: Bilateral upper extremity supported;During functional activity Static Standing - Level of Assistance: 4: Min assist Static Standing - Comment/# of Minutes: 6 minutes    Special needs/care consideration Bowel mgmt: continent Bladder mgmt: foley   Previous Home Environment Living Arrangements: Spouse/significant other  Lives With: Spouse Available Help at Discharge: Family Type of Home: House Home Layout: One level Home Access: Stairs to enter Entrance Stairs-Rails:  Doctor, general practice of Steps: 4 Bathroom Shower/Tub: Engineer, manufacturing systems: Standard Bathroom Accessibility: Yes How Accessible: Accessible via walker Home Care Services: No  Discharge Living Setting Plans for Discharge Living Setting: Patient's home;Lives with (comment);Other (Comment) (wife) Type of Home at Discharge: House Discharge Home Layout: One level Discharge Home Access: Stairs to enter Entrance Stairs-Rails: Right;Left Entrance Stairs-Number of Steps: 4 steps Discharge Bathroom Shower/Tub: Tub/shower unit Discharge Bathroom Toilet: Standard Discharge Bathroom Accessibility: Yes How Accessible: Accessible via walker Does the patient have any problems obtaining your medications?: No  Social/Family/Support Systems Patient Roles: Spouse;Parent Contact Information: Selinda Michaels Anticipated Caregiver: wife and family Anticipated Caregiver's Contact Information: see above Ability/Limitations of Caregiver: supervision Caregiver Availability: 24/7 Discharge Plan Discussed with Primary Caregiver: Yes Is Caregiver In Agreement with Plan?: Yes Does Caregiver/Family have Issues with Lodging/Transportation while Pt is in Rehab?: No    Goals/Additional Needs Patient/Family Goal for Rehab: supervision with PT, OT, and SLP Expected length of stay: ELOS 10 to 14 days Dietary Needs: needs encouragement to eat, loss of appetite Pt/Family Agrees to Admission and willing to participate: Yes Program Orientation Provided & Reviewed with Pt/Caregiver Including Roles  & Responsibilities: Yes   Decrease burden of  Care through IP rehab admission: n/a  Possible need for SNF placement upon discharge: not anticipated  Patient Condition: This patient's medical and functional status has changed since the consult dated: 03/08/13 in which the Rehabilitation Physician determined and documented that the patient's condition is appropriate for intensive rehabilitative care in  an inpatient rehabilitation facility. See "History of Present Illness" (above) for medical update. Functional changes are: overall min assist. Patient's medical and functional status update has been discussed with the Rehabilitation physician and patient remains appropriate for inpatient rehabilitation. Will admit to inpatient rehab today.  Preadmission Screen Completed By:  Clois Dupes, 03/15/2013 2:26 PM ______________________________________________________________________   Discussed status with Dr. Riley Kill on 03/15/13 at  1429 and received telephone approval for admission today.  Admission Coordinator:  Clois Dupes, time 1610 Date 03/15/2013

## 2013-03-14 NOTE — Progress Notes (Signed)
Occupational Therapy Treatment Patient Details Name: Walter Larsen MRN: [id number] DOB: 1935/09/19 Today's Date: 03/14/2013 Time: 4098-1191 OT Time Calculation (min): 32 min  OT Assessment / Plan / Recommendation  History of present illness Walter Larsen is a 77 y.o. male with Past medical history of hypertension, dyslipidemia, recent admission to [facility] with hemorrhagic stroke. The patient is presenting with complaints of altered mental status. He was brought in by his family. As per the family since last one week he had significant headache, after which he was taken to [facility] for hemorrhagic stroke. From where he was discharged day before yesterday. Today he has been having hallucinations and confabulation. He was talking about basketball match but there was none. He also complained of some further frontal headache and some nausea. There were episodes of staring during which he would take a while to respond. And also he had an episode of urinary incontinence. By the EMS arrived he was found to be febrile as well.   OT comments  Pt limited in session due to fatigue and dizziness. Provided education to pt and family. Pt having very difficult time locating items on left side.   Follow Up Recommendations  CIR;Supervision/Assistance - 24 hour    Barriers to Discharge       Equipment Recommendations  Other (comment) (tbd)    Recommendations for Other Services    Frequency Min 2X/week   Progress towards OT Goals Progress towards OT goals: Progressing toward goals  Plan Discharge plan remains appropriate    Precautions / Restrictions Precautions Precautions: Fall Restrictions Weight Bearing Restrictions: No   Pertinent Vitals/Pain Pain in back. Repositioned.     ADL  Grooming: Teeth care;Wash/dry face;Brushing hair;Minimal assistance Where Assessed - Grooming: Supported standing;Supported sitting Toilet Transfer: Min guard;Moderate assistance (Min guard/Mod A-sit  to stand; +2 Total A for stand pivot) Toilet Transfer: Patient Percentage: 50% Toilet Transfer Method: Sit to stand;Stand pivot Toilet Transfer Equipment: Bedside commode Equipment Used: Gait belt;Rolling walker Transfers/Ambulation Related to ADLs: Min guard ambulating to bathroom at beginning of session; +2 Total A for safety to stand pivot back to recliner chair from [facility] as pt was feeling dizzy. Pt performed stand pivot later in session at [facility] guard level.  ADL Comments: Pt stood at sink to perform grooming tasks. Pt had very difficult time locating items on left side of sink. OT had to physically move pt's head to left side due to him not responding to cues. Educated family to sit on left side and also to present things to him on left to force him to look that way so he can learn to compensate. Educated pt on energy conservation as he was fatigued during grooming tasks and also educated family as well. Educated on chair push ups to increase pt's strength in upper extremities.     OT Diagnosis:    OT Problem List:   OT Treatment Interventions:     OT Goals(current goals can now be found in the care plan section) Acute Rehab OT Goals Patient Stated Goal: not stated OT Goal Formulation: With patient Time For Goal Achievement: 03/15/13 Potential to Achieve Goals: Good ADL Goals Pt Will Perform Grooming: with min guard assist;standing Pt Will Perform Lower Body Bathing: with min assist;sit to/from stand Pt Will Perform Lower Body Dressing: with min assist;sit to/from stand Pt Will Transfer to Toilet: with min guard assist;ambulating;bedside commode (sit <> stand) Pt Will Perform Toileting - Clothing Manipulation and hygiene: with min guard assist;sit to/from stand  Additional ADL Goal #1: Pt will perform bed mobility at Min guard level as precursor for ADLs.  Additional ADL Goal #2: Pt will be independent with HEP to increase strength in bilateral upper extremities.  Visit Information  Last  OT Received On: 03/14/13 Assistance Needed: +1 History of Present Illness: Walter Larsen is a 77 y.o. male with Past medical history of hypertension, dyslipidemia, recent admission to wake [facility] with hemorrhagic stroke. The patient is presenting with complaints of altered mental status. He was brought in by his family. As per the family since last one week he had significant headache, after which he was taken to wake [facility] for hemorrhagic stroke. From where he was discharged day before yesterday. Today he has been having hallucinations and confabulation. He was talking about basketball match but there was none. He also complained of some further frontal headache and some nausea. There were episodes of staring during which he would take a while to respond. And also he had an episode of urinary incontinence. By the EMS arrived he was found to be febrile as well.    Subjective Data      Prior Functioning       Cognition  Cognition Arousal/Alertness: Lethargic Behavior During Therapy: Flat affect Overall Cognitive Status: Impaired/Different from baseline Area of Impairment: Following commands;Problem solving Following Commands: Follows one step commands inconsistently Problem Solving: Slow processing;Requires verbal cues;Requires tactile cues    Mobility  Bed Mobility Bed Mobility: Sit to Sidelying Left;Rolling Right;Right Sidelying to Sit Rolling Right: 3: Mod assist;4: Min guard (Min guard rolling to get out of bed) Right Sidelying to Sit: 4: Min guard Sit to Sidelying Left: 2: Max assist Details for Bed Mobility Assistance: Cues for technique. Pt requiring a lot of assistance to get back in bed after he had dizzy spell. Educated family on log roll technique due to pt's back pain.  Transfers Transfers: Sit to Stand;Stand to Sit Sit to Stand: 4: Min guard;With upper extremity assist;From bed;From chair/3-in-1;3: Mod assist Stand to Sit: 4: Min guard;4: Min assist;3: Mod  assist;To bed;To chair/3-in-1 Details for Transfer Assistance: Mod A for sit <> stand to/from [facility] when trying to get back to recliner chair after dizzy spell. +2 for safety for stand pivot to recliner chair after dizzy spell. Pt did better at end of session when returning back to bed.     Exercises      Balance     End of Session OT - End of Session Equipment Utilized During Treatment: Gait belt;Rolling walker Activity Tolerance: Patient limited by fatigue;Other (comment) (dizzy) Patient left: in bed;with call bell/phone within reach;with family/visitor present Nurse Communication: Other (comment) (BP)  GO     Earlie Raveling OTR/L [phone] 03/14/2013, 5:51 PM

## 2013-03-14 NOTE — Progress Notes (Signed)
CSW provided updated bed offers and also left a message with [name] at [facility] in [place], as that is their first choice for placement.   CSW will continue to follow Pt for d/c planning.    Walter Larsen [facility]  [address];  [phone] Phone: [phone]

## 2013-03-14 NOTE — Progress Notes (Addendum)
CSW provided list of bed offers for SNF facilities.   CSW to f/u for bed selection if Pt does not d/c to CIR.   CSW will continue to assist with d/c planning.     Leron Croak [facility]  [address];  [phone] Phone: [phone]

## 2013-03-14 NOTE — Progress Notes (Signed)
I met with pt and his wife at bedside. Discussed inpt rehab venue as an option for his recovery. I await insurance approval to admit pt to inpt rehab possibly tomorrow. [phone]

## 2013-03-14 NOTE — Progress Notes (Signed)
[facility]                                                                                Patient Demographics  Walter Larsen, is a 77 y.o. male, DOB - 1935-06-24, ZOX:[id number]  Admit date - 02/28/2013   Admitting Physician Lynden Oxford, MD  Outpatient Primary MD for the patient is Provider Not In System  LOS - 14   Chief Complaint  Patient presents with  . Altered Mental Status      Interim history This is a 77 year old past medical history of hypertension, dyslipidemia, recent admission was for showing hemorrhagic stroke. Patient was discharged with home and was found to have altered mental status with fevers. He was brought into the emergency department here at [facility] with hallucinations and confabulation. Patient continues to have fevers. Neurosurgery cells ID and neurology been consulted and following patient. Infectious disease workup including a urinalysis chest x-ray MRI of the head to rule out abscess have all been conducted with no source of infection at this time. Patient has been on broad-spectrum antibiotics for quite some time. Patient had MRI conducted showing no abscess. TEE was also conducted showing no endocarditis. Antibiotics were discontinued 11/25. However in workup for his fevers, he was found to have extensive PEs with moderate right heart strain and right lower extremity DVT Precarious situation as recent hemorrhagic stroke therefore cannot anticoagulate and Greenfield filter placed and emergently 11/25 Patient has progressively done well  Assessment & Plan   Principal Problem:   Fever, unspecified Active Problems:   Hemorrhagic stroke   Essential hypertension, benign   Other and unspecified hyperlipidemia   Bradycardia   Hypokalemia   Dry eyes   Hyponatremia   Headache   Acute encephalopathy   Pulmonary emboli  Fever -Unknown etiology, may be secondary to Recent Temporal/occiptal hematoma-Urine and blood cultures shows no growth at this  time.  -Patient has been on empiric antibiotic coverage with vancomycin and ceftrixone, flagyl.  Infectious disease input appreciated discontinued antibiotics 11/25 -ID and neurosurgery consulted and following -MRI suggests emboli -TEE showed no obvious vegetation, no thrombus, no PFO  Pulmonary emboli as well as right lower extremity DVT-undetermined age Greenfield filter placed 11/25 emergently by radiology  precarious situation as cannot anticoagulate given recent hemorrhagic transformation of hematoma Goals of care have been discussed with the patient and the family on 11/28. Understand precarious situation. Will further delineate in the a.m.-patient for now is  Full code Repeat discussion 11/29 has yielded that patient see family wants me to talk to her son- i did discuss this with him 11/29 and family will discuss the same  Hypokalemia -K  3.5, will continue to monitor-labs ordered for a.m.  -Magnesium 2  Right temporal/occipital parenchymal hematoma -Neurosurgery and neurology consulted and following.   -CT Scan: Right temporal/occipital parenchymal hematoma, measuring up to 7 cm in length. Trace intraventricular extension; no hydrocephalus. -ASA held  Encephalopathy/ AMS -Resolved, no longer altered, at baseline -Neurology following  -EEG: This is a normal EEG recording during sleep. No evidence of an epileptic disorder was seen.  Bradycardia -Resovled now has only PVCs, discontinue telemetry   HTN  -Continue metoprolol 50mg  BID.  Continue hydralazine PRN.  BPH -Increase flomax 0.4--0.8 11/27 -Foley d/c 11.26  Hyperlipidemia -Continue atorvastatin   Hyponatremia -Improving, Na 135, will continue IVF.  May be secondary to insensible losses.  Headaches -Continue depakote (for headache and possible seizure)-increased to 750 daily on 12/1  Dry Eyes Continue artificial tears.  Code Status: Full  Family Communication: Wife at bedside  Disposition Plan:  Admitted-possible transfer to inpatient rehabilitation if continues to do well  Procedures EEG: This is a normal EEG recording during sleep. No evidence of an epileptic disorder was seen.  MRI IMPRESSION:  Large subacute hematoma right temporoparietal lobe. Superimposed abscess not considered likely.  Small areas of acute infarct in the right occipital parietal lobe and left temporoparietal lobe, question emboli.  TEE Impressions: No vegetations present.  Consults   Neurology Neurosurgery Infectious Disease Cardiology  DVT Prophylaxis  SCDs  Lab Results  Component Value Date   PLT 215 03/12/2013    Medications  Scheduled Meds: . amLODipine  5 mg Oral Daily  . atorvastatin  20 mg Oral QHS  . cyclobenzaprine  5 mg Oral QHS  . divalproex  500 mg Oral Daily  . feeding supplement (ENSURE COMPLETE)  237 mL Oral BID BM  . LORazepam  1 mg Intravenous Once  . metoprolol tartrate  50 mg Oral BID  . pantoprazole  40 mg Oral Daily  . tamsulosin  0.4 mg Oral QPC breakfast   Continuous Infusions:   PRN Meds:.acetaminophen, acetaminophen, hydrALAZINE, polyvinyl alcohol  Antibiotics    Anti-infectives   Start     Dose/Rate Route Frequency Ordered Stop   03/05/13 0800  vancomycin (VANCOCIN) 1,250 mg in sodium chloride 0.9 % 250 mL IVPB  Status:  Discontinued     1,250 mg 166.7 mL/hr over 90 Minutes Intravenous Every 8 hours 03/05/13 0225 03/07/13 2041   03/03/13 0300  cefTRIAXone (ROCEPHIN) 2 g in dextrose 5 % 50 mL IVPB  Status:  Discontinued     2 g 100 mL/hr over 30 Minutes Intravenous Every 12 hours 03/02/13 1453 03/07/13 2041   03/02/13 1300  cefTRIAXone (ROCEPHIN) 2 g in dextrose 5 % 50 mL IVPB  Status:  Discontinued     2 g 100 mL/hr over 30 Minutes Intravenous Every 24 hours 03/02/13 1241 03/02/13 1453   03/02/13 1300  metroNIDAZOLE (FLAGYL) IVPB 500 mg  Status:  Discontinued     500 mg 100 mL/hr over 60 Minutes Intravenous Every 6 hours 03/02/13 1241 03/07/13 0914    03/02/13 1300  vancomycin (VANCOCIN) IVPB 1000 mg/200 mL premix  Status:  Discontinued     1,000 mg 200 mL/hr over 60 Minutes Intravenous Every 12 hours 03/02/13 1252 03/05/13 0224   03/01/13 1400  cefTRIAXone (ROCEPHIN) 2 g in dextrose 5 % 50 mL IVPB  Status:  Discontinued     2 g 100 mL/hr over 30 Minutes Intravenous Every 24 hours 03/01/13 1245 03/02/13 0952   03/01/13 1200  vancomycin (VANCOCIN) IVPB 1000 mg/200 mL premix  Status:  Discontinued     1,000 mg 200 mL/hr over 60 Minutes Intravenous Every 12 hours 03/01/13 0353 03/02/13 0952   03/01/13 0600  piperacillin-tazobactam (ZOSYN) IVPB 3.375 g  Status:  Discontinued     3.375 g 12.5 mL/hr over 240 Minutes Intravenous 3 times per day 03/01/13 0353 03/01/13 1245   02/28/13 2300  piperacillin-tazobactam (ZOSYN) IVPB 3.375 g     3.375 g 100 mL/hr over 30 Minutes Intravenous  Once 02/28/13 2249 03/01/13 0007   02/28/13  2300  vancomycin (VANCOCIN) IVPB 1000 mg/200 mL premix     1,000 mg 200 mL/hr over 60 Minutes Intravenous  Once 02/28/13 2249 03/01/13 0104       Time Spent in minutes   20 minutes  Pleas Koch, MD [facility] [phone]   [facility] Office  [phone]    Subjective:   Doing fair. Much more oriented and alert and conversant.  Son in the room No other issues currently Noted low grade temp  Objective:   Filed Vitals:   03/13/13 2141 03/14/13 0636 03/14/13 0930 03/14/13 1441  BP: 151/83 139/71 127/68 119/97  Pulse: 95 77 88 92  Temp: 98.2 F (36.8 C) 98.5 F (36.9 C) 97.5 F (36.4 C) 98.7 F (37.1 C)  TempSrc: Oral Oral Oral Oral  Resp: 18 19 18 18   Height:      Weight:      SpO2: 94% 95% 97% 97%    Wt Readings from Last 3 Encounters:  03/01/13 81.647 kg (180 lb)  03/01/13 81.647 kg (180 lb)     Intake/Output Summary (Last 24 hours) at 03/14/13 1655 Last data filed at 03/14/13 1400  Gross per 24 hour  Intake    120 ml  Output   3850 ml  Net  -3730 ml     Exam  General: Well developed, well nourished, NAD, appears stated age  HEENT: NCAT, Anicteic Sclera, mucous membranes moist.   Neck: Supple, no JVD, no masses  Cardiovascular: S1 S2 auscultated, no rubs, murmurs or gallops. Regular rate and rhythm.  Respiratory: Clear to auscultation bilaterally with equal chest rise  Abdomen: Soft, nontender, nondistended, + bowel sounds Data Review   Micro Results Recent Results (from the past 240 hour(s))  CLOSTRIDIUM DIFFICILE BY PCR     Status: None   Collection Time    03/11/13  9:30 PM      Result Value Range Status   C difficile by pcr NEGATIVE  NEGATIVE Final    Radiology Reports Dg Chest 2 View  03/01/2013   CLINICAL DATA:  Fever.  EXAM: CHEST - 2 VIEW  COMPARISON:  02/28/2013  FINDINGS: The heart size and mediastinal contours are within normal limits. There is no evidence of pulmonary edema, consolidation, pneumothorax, nodule or pleural fluid. Stable degenerative changes are seen in the thoracic spine  IMPRESSION: No active disease.   Electronically Signed   By: Irish Lack M.D.   On: 03/01/2013 16:35   Ct Head Wo Contrast  02/28/2013   CLINICAL DATA:  Altered mental status.  Stroke.  EXAM: CT HEAD WITHOUT CONTRAST  TECHNIQUE: Contiguous axial images were obtained from the base of the skull through the vertex without intravenous contrast.  COMPARISON:  None.  FINDINGS: Skull and Sinuses:No significant abnormality.  Orbits: Bilateral cataract resection.  Brain: Acute hemorrhage in the posterior right cerebral hemisphere, located in the posterior temporal and occipital regions. The hematoma is irregularly shaped, but the entire area of abnormality measures 7cm in length, 3 cm in width. There is a moderate amount of surrounding cerebral edema. Minimal intraventricular extension into the right lateral ventricle. No herniation, midline shift, or hydrocephalus.  Chronic small vessel ischemic white matter disease with patchy bilateral  cerebral white matter low density. Extensive intracranial atherosclerotic calcification.  Critical Value/emergent results were called by telephone at the time of interpretation on 02/28/2013 at 11:13 PM to Dr.JOSHUA ZAVITZ , who verbally acknowledged these results.  IMPRESSION: 1. Right temporal/occipital parenchymal hematoma, measuring up to 7  cm in length. Trace intraventricular extension; no hydrocephalus. 2. Chronic small vessel ischemic white matter disease.   Electronically Signed   By: Tiburcio Pea M.D.   On: 02/28/2013 23:16   Dg Chest Portable 1 View  02/28/2013   CLINICAL DATA:  Altered mental status.  EXAM: PORTABLE CHEST - 1 VIEW  COMPARISON:  None.  FINDINGS: The heart size and mediastinal contours are within normal limits. Both lungs are clear. The visualized skeletal structures are unremarkable.  IMPRESSION: No active disease.   Electronically Signed   By: Tiburcio Pea M.D.   On: 02/28/2013 23:03    CBC  Recent Labs Lab 03/08/13 0610 03/12/13 0605  WBC 13.8* 10.4  HGB 13.2 12.2*  HCT 37.5* 35.2*  PLT 239 215  MCV 90.8 92.4  MCH 32.0 32.0  MCHC 35.2 34.7  RDW 13.8 14.1  LYMPHSABS  --  1.3  MONOABS  --  1.1*  EOSABS  --  0.2  BASOSABS  --  0.1    Chemistries   Recent Labs Lab 03/08/13 0610 03/09/13 0952 03/12/13 0605  NA 135 134* 137  K 3.5 3.9 3.8  CL 101 101 105  CO2 20 22 23   GLUCOSE 103* 134* 88  BUN 19 16 8   CREATININE 1.29 0.76 0.64  CALCIUM 8.5 8.7 8.3*  AST  --  39*  --   ALT  --  31  --   ALKPHOS  --  109  --   BILITOT  --  0.6  --    ------------------------------------------------------------------------------------------------------------------ estimated creatinine clearance is 78.4 ml/min (by C-G formula based on Cr of 0.64). ------------------------------------------------------------------------------------------------------------------ No results found for this basename: HGBA1C,  in the last 72  hours ------------------------------------------------------------------------------------------------------------------ No results found for this basename: CHOL, HDL, LDLCALC, TRIG, CHOLHDL, LDLDIRECT,  in the last 72 hours ------------------------------------------------------------------------------------------------------------------ No results found for this basename: TSH, T4TOTAL, FREET3, T3FREE, THYROIDAB,  in the last 72 hours ------------------------------------------------------------------------------------------------------------------ No results found for this basename: VITAMINB12, FOLATE, FERRITIN, TIBC, IRON, RETICCTPCT,  in the last 72 hours  Coagulation profile No results found for this basename: INR, PROTIME,  in the last 168 hours  No results found for this basename: DDIMER,  in the last 72 hours  Cardiac Enzymes No results found for this basename: CK, CKMB, TROPONINI, MYOGLOBIN,  in the last 168 hours ------------------------------------------------------------------------------------------------------------------ No components found with this basename: POCBNP,

## 2013-03-14 NOTE — Progress Notes (Signed)
Seen and agreed 03/14/2013 Fredrich Birks PTA [phone] pager [phone] office

## 2013-03-14 NOTE — Progress Notes (Signed)
NUTRITION FOLLOW UP  Intervention:    Continue Ensure Complete BID (350 kcals, 13 gm protein per 8 fl oz bottle) RD to follow for nutrition care plan  Nutrition Dx:   Inadequate oral intake, improving  Goal:   Pt to meet >/= 90% of their estimated nutrition needs, progressing  Monitor:   PO & supplemental intake, weight, labs, I/O's  Assessment:   PMHx significant for HTN, dyslipidemia. Recently admitted to [facility] with hemorrhagic stroke, discharged the day before yesterday. Admitted with AMS, hallucinations, HA, fever and nausea.   Patient s/p IR IVC FILTER PLMT 11/25 for acute pulmonary embolism.  Patient reports his appetite is slowly improving.  He doesn't really care for the hospital food.  PO intake 25-50% per flowsheet records.  Drinking at least 2 Ensure Complete supplements per day -- encouraged him to continue.  Height: Ht Readings from Last 1 Encounters:  03/01/13 5\' 8"  (1.727 m)    Weight Status:   Wt Readings from Last 1 Encounters:  03/01/13 180 lb (81.647 kg)    Re-estimated needs:  Kcal: 1700-1900 Protein: 80-90 gm Fluid: 1.7-1.9 L  Skin: Stage II pressure ulcer to sacrum  Diet Order: Cardiac   Intake/Output Summary (Last 24 hours) at 03/14/13 1415 Last data filed at 03/14/13 0644  Gross per 24 hour  Intake    120 ml  Output   2750 ml  Net  -2630 ml    Labs:   Recent Labs Lab 03/08/13 0610 03/09/13 0952 03/12/13 0605  NA 135 134* 137  K 3.5 3.9 3.8  CL 101 101 105  CO2 20 22 23   BUN 19 16 8   CREATININE 1.29 0.76 0.64  CALCIUM 8.5 8.7 8.3*  GLUCOSE 103* 134* 88    Scheduled Meds: . amLODipine  5 mg Oral Daily  . atorvastatin  20 mg Oral QHS  . cyclobenzaprine  5 mg Oral QHS  . divalproex  500 mg Oral Daily  . feeding supplement (ENSURE COMPLETE)  237 mL Oral BID BM  . LORazepam  1 mg Intravenous Once  . metoprolol tartrate  50 mg Oral BID  . pantoprazole  40 mg Oral Daily  . tamsulosin  0.4 mg Oral QPC  breakfast    Continuous Infusions:   Maureen Chatters, RD, LDN Pager #: [phone] After-Hours Pager #: [phone]

## 2013-03-14 NOTE — Progress Notes (Signed)
Physical Therapy Treatment Patient Details Name: Walter Larsen MRN: [id number] DOB: [birth year] Today's Date: 03/14/2013 Time: 4098-1191 PT Time Calculation (min): 27 min  PT Assessment / Plan / Recommendation  History of Present Illness Walter Larsen is a 77 y.o. male with Past medical history of hypertension, dyslipidemia, [facility] admission [facility] with hemorrhagic stroke. The patient is presenting with complaints of altered mental status. He was brought in by his family. As per the family since last one week he had significant headache, after which he was taken to [facility] for hemorrhagic stroke. From where he was discharged day before yesterday. Today he has been having hallucinations and confabulation. He was talking about basketball match but there was none. He also complained of some further frontal headache and some nausea. There were episodes of staring during which he would take a while to respond. And also he had an episode of urinary incontinence. By the EMS arrived he was found to be febrile as well.   PT Comments   Pt progressing well with mobility today with increased ambulation. Pt was more participatory during PT and more motivated. Pt will benefit greatly from CIR to increase functional I and mobility prior to d/c home.  Follow Up Recommendations  CIR     Does the patient have the potential to tolerate intense rehabilitation     Barriers to Discharge        Equipment Recommendations       Recommendations for Other Services Rehab consult  Frequency Min 3X/week   Progress towards PT Goals Progress towards PT goals: Progressing toward goals  Plan Current plan remains appropriate    Precautions / Restrictions Precautions Precautions: Fall Restrictions Weight Bearing Restrictions: No   Pertinent Vitals/Pain Denied pain; c/o of back "discomfort" with gait    Mobility  Bed Mobility Bed Mobility: Sitting - Scoot to Edge of Bed;Supine to Sit Supine  to Sit: 4: Min guard;HOB elevated Sitting - Scoot to Edge of Bed: 4: Min guard Details for Bed Mobility Assistance: vc's for technique and to continue process; no physical assistance needed today, however increased time to complete task Transfers Transfers: Sit to Stand;Stand to Sit Sit to Stand: 4: Min guard;With upper extremity assist;From bed;From chair/3-in-1 Stand to Sit: 4: Min guard;To chair/3-in-1;To bed;With upper extremity assist Details for Transfer Assistance: vc's for hand placement for safest technique Ambulation/Gait Ambulation/Gait Assistance: 4: Min guard;4: Min assist Ambulation Distance (Feet): 50 Feet Assistive device: Rolling walker Ambulation/Gait Assistance Details: 31' x2 with seated rest break between due to fatigue Gait Pattern: Step-through pattern;Decreased stride length;Trunk flexed Gait velocity: decreased    Exercises     PT Diagnosis:    PT Problem List:   PT Treatment Interventions:     PT Goals (current goals can now be found in the care plan section)    Visit Information  Last PT Received On: 03/14/13 Assistance Needed: +1 History of Present Illness: Walter Larsen is a 77 y.o. male with Past medical history of hypertension, dyslipidemia, [facility] with hemorrhagic stroke. The patient is presenting with complaints of altered mental status. He was brought in by his family. As per the family since last one week he had significant headache, after which [facility] was [facility] for hemorrhagic stroke. From where he was discharged day before yesterday. Today he has been having hallucinations and confabulation. He was talking about basketball match but there was none. He also complained of some further frontal  headache and some nausea. There were episodes of staring during which he would take a while to respond. And also he had an episode of urinary incontinence. By the EMS arrived he was found to be febrile as well.     Subjective Data      Cognition  Cognition Arousal/Alertness: Awake/alert Behavior During Therapy: Flat affect Overall Cognitive Status: Within Functional Limits for tasks assessed Area of Impairment: Attention;Following commands;Problem solving Current Attention Level: Selective Following Commands: Follows one step commands with increased time Problem Solving: Slow processing;Difficulty sequencing;Requires verbal cues;Requires tactile cues    Balance     End of Session PT - End of Session Equipment Utilized During Treatment: Gait belt Activity Tolerance: Patient limited by fatigue Patient left: with call bell/phone within reach;in chair;with bed alarm set;with family/visitor present Nurse Communication: Mobility status   GP     Ernestina Columbia, SPTA 03/14/2013, 9:16 AM

## 2013-03-15 ENCOUNTER — Inpatient Hospital Stay (HOSPITAL_COMMUNITY)
Admission: RE | Admit: 2013-03-15 | Discharge: 2013-04-06 | DRG: 945 | Disposition: A | Discharge status: Home Health | Payer: Medicare Other | Source: Intra-hospital | Attending: Physical Medicine & Rehabilitation | Admitting: Physical Medicine & Rehabilitation

## 2013-03-15 ENCOUNTER — Encounter (HOSPITAL_COMMUNITY): Payer: Self-pay | Admitting: *Deleted

## 2013-03-15 DIAGNOSIS — L89109 Pressure ulcer of unspecified part of back, unspecified stage: Secondary | ICD-10-CM | POA: Diagnosis not present

## 2013-03-15 DIAGNOSIS — E785 Hyperlipidemia, unspecified: Secondary | ICD-10-CM | POA: Diagnosis present

## 2013-03-15 DIAGNOSIS — N401 Enlarged prostate with lower urinary tract symptoms: Secondary | ICD-10-CM | POA: Diagnosis present

## 2013-03-15 DIAGNOSIS — Z5189 Encounter for other specified aftercare: Principal | ICD-10-CM

## 2013-03-15 DIAGNOSIS — R339 Retention of urine, unspecified: Secondary | ICD-10-CM | POA: Diagnosis present

## 2013-03-15 DIAGNOSIS — I10 Essential (primary) hypertension: Secondary | ICD-10-CM | POA: Diagnosis present

## 2013-03-15 DIAGNOSIS — I619 Nontraumatic intracerebral hemorrhage, unspecified: Secondary | ICD-10-CM | POA: Diagnosis present

## 2013-03-15 DIAGNOSIS — Z86718 Personal history of other venous thrombosis and embolism: Secondary | ICD-10-CM | POA: Diagnosis not present

## 2013-03-15 DIAGNOSIS — I2510 Atherosclerotic heart disease of native coronary artery without angina pectoris: Secondary | ICD-10-CM | POA: Diagnosis present

## 2013-03-15 DIAGNOSIS — G4700 Insomnia, unspecified: Secondary | ICD-10-CM | POA: Diagnosis present

## 2013-03-15 DIAGNOSIS — Z9861 Coronary angioplasty status: Secondary | ICD-10-CM | POA: Diagnosis not present

## 2013-03-15 DIAGNOSIS — L8990 Pressure ulcer of unspecified site, unspecified stage: Secondary | ICD-10-CM | POA: Diagnosis not present

## 2013-03-15 DIAGNOSIS — Z86711 Personal history of pulmonary embolism: Secondary | ICD-10-CM | POA: Diagnosis not present

## 2013-03-15 DIAGNOSIS — N138 Other obstructive and reflux uropathy: Secondary | ICD-10-CM | POA: Diagnosis present

## 2013-03-15 LAB — GLUCOSE, CAPILLARY: Glucose-Capillary: 153 mg/dL — ABNORMAL HIGH (ref 70–99)

## 2013-03-15 MED ORDER — ATORVASTATIN CALCIUM 20 MG PO TABS
20.0000 mg | ORAL_TABLET | Freq: Every day | ORAL | Status: DC
  Administered 2013-03-15 – 2013-04-05 (×22): 20 mg via ORAL
  Filled 2013-03-15 (×24): qty 1

## 2013-03-15 MED ORDER — DIVALPROEX SODIUM ER 500 MG PO TB24
750.0000 mg | ORAL_TABLET | Freq: Every day | ORAL | Status: DC
  Administered 2013-03-16 – 2013-03-30 (×15): 750 mg via ORAL
  Filled 2013-03-15 (×17): qty 1

## 2013-03-15 MED ORDER — TAMSULOSIN HCL 0.4 MG PO CAPS: 0.4000 mg | ORAL_CAPSULE | Freq: Every day | ORAL | Status: DC

## 2013-03-15 MED ORDER — ONDANSETRON HCL 4 MG/2ML IJ SOLN: 4.0000 mg | Freq: Four times a day (QID) | INTRAMUSCULAR | Status: DC | PRN

## 2013-03-15 MED ORDER — ONDANSETRON HCL 4 MG PO TABS: 4.0000 mg | ORAL_TABLET | Freq: Four times a day (QID) | ORAL | Status: DC | PRN

## 2013-03-15 MED ORDER — METOPROLOL TARTRATE 50 MG PO TABS
50.0000 mg | ORAL_TABLET | Freq: Two times a day (BID) | ORAL | Status: DC
  Administered 2013-03-15 – 2013-04-01 (×34): 50 mg via ORAL
  Filled 2013-03-15 (×36): qty 1

## 2013-03-15 MED ORDER — POLYVINYL ALCOHOL 1.4 % OP SOLN
2.0000 [drp] | OPHTHALMIC | Status: DC | PRN
  Administered 2013-04-02 – 2013-04-05 (×3): 2 [drp] via OPHTHALMIC
  Filled 2013-03-15: qty 15

## 2013-03-15 MED ORDER — SORBITOL 70 % SOLN
30.0000 mL | Freq: Every day | Status: DC | PRN
  Administered 2013-03-18 – 2013-04-04 (×3): 30 mL via ORAL
  Filled 2013-03-15 (×3): qty 30

## 2013-03-15 MED ORDER — AMLODIPINE BESYLATE 5 MG PO TABS: 5.0000 mg | ORAL_TABLET | Freq: Every day | ORAL | Status: DC

## 2013-03-15 MED ORDER — TAMSULOSIN HCL 0.4 MG PO CAPS
0.4000 mg | ORAL_CAPSULE | Freq: Every day | ORAL | Status: DC
Start: 2013-03-16 — End: 2013-04-06
  Administered 2013-03-16 – 2013-04-06 (×22): 0.4 mg via ORAL
  Filled 2013-03-15 (×24): qty 1

## 2013-03-15 MED ORDER — AMLODIPINE BESYLATE 5 MG PO TABS
5.0000 mg | ORAL_TABLET | Freq: Every day | ORAL | Status: DC
  Administered 2013-03-16 – 2013-03-21 (×6): 5 mg via ORAL
  Filled 2013-03-15 (×8): qty 1

## 2013-03-15 MED ORDER — PANTOPRAZOLE SODIUM 40 MG PO TBEC
40.0000 mg | DELAYED_RELEASE_TABLET | Freq: Every day | ORAL | Status: DC
  Administered 2013-03-16 – 2013-04-06 (×22): 40 mg via ORAL
  Filled 2013-03-15 (×24): qty 1

## 2013-03-15 MED ORDER — ENSURE COMPLETE PO LIQD
237.0000 mL | Freq: Two times a day (BID) | ORAL | Status: DC
  Administered 2013-03-16 – 2013-04-04 (×33): 237 mL via ORAL

## 2013-03-15 MED ORDER — DIVALPROEX SODIUM ER 250 MG PO TB24: 750.0000 mg | ORAL_TABLET | Freq: Every day | ORAL | Status: DC

## 2013-03-15 MED ORDER — ACETAMINOPHEN 325 MG PO TABS
650.0000 mg | ORAL_TABLET | Freq: Four times a day (QID) | ORAL | Status: DC | PRN
  Administered 2013-03-17 – 2013-04-05 (×17): 650 mg via ORAL
  Filled 2013-03-15 (×19): qty 2

## 2013-03-15 NOTE — Discharge Summary (Signed)
Physician Discharge Summary  Walter Larsen ZOX:[id number] DOB: November 29, 1935 DOA: 02/28/2013  PCP: Provider Not In System  Admit date: 02/28/2013 Discharge date: 03/15/2013  Time spent: 40 minutes  Recommendations for Outpatient Follow-up:  1. Patient will need further specialty care at [facility] in hospital rehabilitation Center  2. Goals of care has been started with patient and family-he is in a precarious position as he has intracranial hemorrhage, bilateral pulmonary emboli and right lower extremity DVT is not a candidate for anticoagulation but does have a Greenfield filter 3. I would recommend labs every 3 days CBC/Cmet  Discharge Diagnoses:  Principal Problem:   Fever, unspecified Active Problems:   Hemorrhagic stroke   Essential hypertension, benign   Other and unspecified hyperlipidemia   Bradycardia   Hypokalemia   Dry eyes   Hyponatremia   Headache   Acute encephalopathy   Pulmonary emboli   Discharge Condition: fair  Diet recommendation: heart healthy  Filed Weights   03/01/13 0025 03/01/13 0152  Weight: 81.647 kg (180 lb) 81.647 kg (180 lb)    History of present illness:  This is a 77 year old past medical history of hypertension, dyslipidemia, recent admission was for showing hemorrhagic stroke. Patient was discharged with home and was found to have altered mental status with fevers. He was brought into the emergency department here at [facility] with hallucinations and confabulation. Patient continues to have fevers. Neurosurgery cells ID and neurology been consulted and following patient. Infectious disease workup including a urinalysis chest x-ray MRI of the head to rule out abscess have all been conducted with no source of infection at this time. Patient has been on broad-spectrum antibiotics for quite some time. Patient had MRI conducted showing no abscess. TEE was also conducted showing no endocarditis. Antibiotics were discontinued 11/25.  However in workup  for his fevers, he was found to have extensive PEs with moderate right heart strain and right lower extremity DVT  Precarious situation as recent hemorrhagic stroke therefore cannot anticoagulate and Greenfield filter placed and emergently 11/25  Patient has progressively done well He has multiple times been trialed to discontinue his Foley catheter, and because he does not have sensation to void, he has been discharged to rehabilitation with the Foley catheter.   Hospital Course:  Fever  -Unknown etiology, may be secondary to Recent Temporal/occiptal hematoma-Urine and blood cultures shows no growth at this time.  -Patient has been on empiric antibiotic coverage with vancomycin and ceftrixone, flagyl. Infectious disease input appreciated discontinued antibiotics 11/25  -ID and neurosurgery consulted and following  -MRI suggests emboli  -TEE showed no obvious vegetation, no thrombus, no PFO  Pulmonary emboli as well as right lower extremity DVT-undetermined age  Greenfield filter placed 11/25 emergently by radiology  precarious situation as cannot anticoagulate given recent hemorrhagic transformation of hematoma  Goals of care have been discussed with the patient and the family on 11/28. Understand precarious situation. Will further delineate in the a.m.-patient for now is Full code  Repeat discussion 11/29 has yielded that patient see family wants me to talk to her son- i did discuss this with him 11/29 and family will discuss the same- -family is to make a final decision about goals of care subsequently Patient is doing fairly well all considered Hypokalemia  -K 3.8on last check. Repeat labs every 3 days Right temporal/occipital parenchymal hematoma  -Neurosurgery and neurology consulted and following.  -CT Scan: Right temporal/occipital parenchymal hematoma, measuring up to 7 cm in length. Trace intraventricular extension; no hydrocephalus.  -  ASA held  Encephalopathy/ AMS  -Resolved, no  longer altered, at baseline  -Neurology following  -EEG: This is a normal EEG recording during sleep. No evidence of an epileptic disorder was seen.  Bradycardia  -Resovled now has only PVCs, discontinue telemetry  HTN  -Continue metoprolol 50mg  BID. Continue hydralazine PRN.  BPH  -Increase flomax 0.4--0.8 11/27  -Foley was initially discontinued on 11/26 however needed to be replaced given the fact that patient developed retention again- Offending agents were discontinued inclusive of opiates Consider outpatient neurology followup and urodynamic studies Hyperlipidemia  -Continue atorvastatin  Hyponatremia  -resolved May be secondary to insensible losses.  Headaches  -Continue depakote (for headache and possible seizure)-increased to 750 daily on 12/1  Dry Eyes  Continue artificial tears.  Procedures  EEG: This is a normal EEG recording during sleep. No evidence of an epileptic disorder was seen.  MRI  IMPRESSION:  Large subacute hematoma right temporoparietal lobe. Superimposed abscess not considered likely.  Small areas of acute infarct in the right occipital parietal lobe and left temporoparietal lobe, question emboli.  TEE  Impressions: No vegetations present.  Consults  Neurology  Neurosurgery  Infectious Disease  Cardiology   Discharge Exam: Filed Vitals:   03/15/13 0611  BP: 126/58  Pulse: 80  Temp: 98 F (36.7 C)  Resp: 19   Doing well much more responsive, tolerating diet no specific pain presently Wife at bedside General: EOMI NCAT, mild slowing of speech Cardiovascular: S1-S2 no murmur rub or gallop Respiratory: clinically clear  Discharge Instructions  Discharge Orders   Future Orders Complete By Expires   Diet - low sodium heart healthy  As directed    Increase activity slowly  As directed        Medication List    STOP taking these medications       aspirin 325 MG tablet      TAKE these medications       amLODipine 5 MG tablet   Commonly known as:  NORVASC  Take 1 tablet (5 mg total) by mouth daily.     atorvastatin 20 MG tablet  Commonly known as:  LIPITOR  Take 20 mg by mouth at bedtime.     divalproex 250 MG 24 hr tablet  Commonly known as:  DEPAKOTE ER  Take 3 tablets (750 mg total) by mouth daily.     esomeprazole 40 MG capsule  Commonly known as:  NEXIUM  Take 40 mg by mouth daily before breakfast.     fenofibrate 145 MG tablet  Commonly known as:  TRICOR  Take 145 mg by mouth at bedtime.     FISH OIL PO  Take 1 capsule by mouth daily with breakfast.     metoprolol 50 MG tablet  Commonly known as:  LOPRESSOR  Take 50 mg by mouth daily with lunch.     multivitamin with minerals Tabs tablet  Take 1 tablet by mouth daily with breakfast.     OSTEO BI-FLEX REGULAR STRENGTH PO  Take 1 tablet by mouth daily with breakfast.     tamsulosin 0.4 MG Caps capsule  Commonly known as:  FLOMAX  Take 1 capsule (0.4 mg total) by mouth daily after breakfast.       Allergies  Allergen Reactions  . Ativan [Lorazepam]     confusion       Follow-up Information   Follow up with Provider Not In System.       The results of significant diagnostics from this  hospitalization (including imaging, microbiology, ancillary and laboratory) are listed below for reference.    Significant Diagnostic Studies: Dg Chest 2 View  03/01/2013   CLINICAL DATA:  Fever.  EXAM: CHEST - 2 VIEW  COMPARISON:  02/28/2013  FINDINGS: The heart size and mediastinal contours are within normal limits. There is no evidence of pulmonary edema, consolidation, pneumothorax, nodule or pleural fluid. Stable degenerative changes are seen in the thoracic spine  IMPRESSION: No active disease.   Electronically Signed   By: Irish Lack M.D.   On: 03/01/2013 16:35   Ct Head Wo Contrast  02/28/2013   CLINICAL DATA:  Altered mental status.  Stroke.  EXAM: CT HEAD WITHOUT CONTRAST  TECHNIQUE: Contiguous axial images were obtained from the  base of the skull through the vertex without intravenous contrast.  COMPARISON:  None.  FINDINGS: Skull and Sinuses:No significant abnormality.  Orbits: Bilateral cataract resection.  Brain: Acute hemorrhage in the posterior right cerebral hemisphere, located in the posterior temporal and occipital regions. The hematoma is irregularly shaped, but the entire area of abnormality measures 7cm in length, 3 cm in width. There is a moderate amount of surrounding cerebral edema. Minimal intraventricular extension into the right lateral ventricle. No herniation, midline shift, or hydrocephalus.  Chronic small vessel ischemic white matter disease with patchy bilateral cerebral white matter low density. Extensive intracranial atherosclerotic calcification.  Critical Value/emergent results were called by telephone at the time of interpretation on 02/28/2013 at 11:13 PM to Dr.JOSHUA ZAVITZ , who verbally acknowledged these results.  IMPRESSION: 1. Right temporal/occipital parenchymal hematoma, measuring up to 7 cm in length. Trace intraventricular extension; no hydrocephalus. 2. Chronic small vessel ischemic white matter disease.   Electronically Signed   By: Tiburcio Pea M.D.   On: 02/28/2013 23:16   Ct Chest W Contrast  03/08/2013   CLINICAL DATA:  Fever of unknown origin  EXAM: CT CHEST, ABDOMEN, AND PELVIS WITH CONTRAST  TECHNIQUE: Multidetector CT imaging of the chest, abdomen and pelvis was performed following the standard protocol during bolus administration of intravenous contrast.  CONTRAST:  90mL OMNIPAQUE IOHEXOL 300 MG/ML  SOLN  COMPARISON:  None.  FINDINGS: CT CHEST FINDINGS  Although not performed for evaluation of the pulmonary arteries, there are bilateral lobar and segmental pulmonary emboli to all lobes. Overall clot burden is moderate.  Associated findings suggesting right heart strain, including straightening of the interventricular septum, an elevated RV to LV ratio, and leftward bowing of the  inter-atrial septum.  Evaluation of the lung parenchyma is mildly constrained by respiratory motion.  Mild dependent atelectasis in the bilateral lower lobes. Small left and trace right pleural effusions.  The visualized thyroid is unremarkable.  The heart is top normal in size. Coronary atherosclerosis. Atherosclerotic calcifications of the aortic arch.  Small mediastinal lymph nodes which do not meet pathologic CT size criteria. No suspicious hilar or axillary lymphadenopathy.  Degenerative changes of the thoracic spine.  CT ABDOMEN AND PELVIS FINDINGS  Motion degraded images.  Liver, spleen, pancreas, and adrenal glands are within normal limits.  Gallbladder is underdistended. No intrahepatic or extrahepatic ductal dilatation.  Two interpolar right renal cyst measuring 11 mm (series 7/ image 18) and 15 mm (series 7/ image 21). Scattered small left renal cysts measuring up to 10 mm (series 7/ image 20). No hydronephrosis.  No evidence of bowel obstruction. No convincing bowel wall thickening or inflammatory changes.  Atherosclerotic calcifications of the abdominal aorta and branch vessels.  No abdominopelvic ascites.  No suspicious  abdominopelvic lymphadenopathy.  Prostate is notable for enlargement of the central gland which indents the base of the bladder.  Bladder is within normal limits.  Degenerative changes of the lumbar spine.  IMPRESSION: Bilateral lobar and segmental pulmonary emboli, as described above. Overall clot burden is moderate.  Secondary findings worrisome for right heart strain.  Small left and trace right pleural effusions.  Critical value/emergent results were called by telephone at the time of interpretation on 03/08/2013 at 4:27 PM to Dr.CORNELIUS VAN DAM , who verbally acknowledged these results.   Electronically Signed   By: Charline Bills M.D.   On: 03/08/2013 16:43   Mr Laqueta Jean RU Contrast  03/05/2013   CLINICAL DATA:  Cerebral hematoma. Headache and fever. Rule out abscess   EXAM: MRI HEAD WITHOUT AND WITH CONTRAST  TECHNIQUE: Multiplanar, multiecho pulse sequences of the brain and surrounding structures were obtained without and with intravenous contrast.  CONTRAST:  1 MULTIHANCE GADOBENATE DIMEGLUMINE 529 MG/ML IV SOLN  COMPARISON:  CT head 02/28/2013  FINDINGS: Subacute hematoma in the right temporal parietal lobe measures 37 x 77 mm. Thick wall of methemoglobin is seen around the hematoma. Findings are not likely due to abscess as there is not a large amount of surrounding white matter edema. There is a mild amount of white matter edema. There is local mass effect.  Diffusion-weighted imaging reveals small areas of restricted diffusion in the right occipital parietal lobe which may be due to small embolic infarcts. There is a small area of restricted diffusion in the left temporoparietal cortex which may be a small acute infarct. The hematoma also shows restricted diffusion as expected.  Ventricle size is normal. Chronic microvascular ischemic changes in the white matter.  Dural sinuses appear patent.  No evidence of venous thrombosis.  Image quality degraded by moderate motion.  IMPRESSION: Large subacute hematoma right temporoparietal lobe. Superimposed abscess not considered likely.  Small areas of acute infarct in the right occipital parietal lobe and left temporoparietal lobe, question emboli.   Electronically Signed   By: Marlan Palau M.D.   On: 03/05/2013 16:48   Ct Abdomen Pelvis W Contrast  03/08/2013   CLINICAL DATA:  Fever of unknown origin  EXAM: CT CHEST, ABDOMEN, AND PELVIS WITH CONTRAST  TECHNIQUE: Multidetector CT imaging of the chest, abdomen and pelvis was performed following the standard protocol during bolus administration of intravenous contrast.  CONTRAST:  90mL OMNIPAQUE IOHEXOL 300 MG/ML  SOLN  COMPARISON:  None.  FINDINGS: CT CHEST FINDINGS  Although not performed for evaluation of the pulmonary arteries, there are bilateral lobar and segmental  pulmonary emboli to all lobes. Overall clot burden is moderate.  Associated findings suggesting right heart strain, including straightening of the interventricular septum, an elevated RV to LV ratio, and leftward bowing of the inter-atrial septum.  Evaluation of the lung parenchyma is mildly constrained by respiratory motion.  Mild dependent atelectasis in the bilateral lower lobes. Small left and trace right pleural effusions.  The visualized thyroid is unremarkable.  The heart is top normal in size. Coronary atherosclerosis. Atherosclerotic calcifications of the aortic arch.  Small mediastinal lymph nodes which do not meet pathologic CT size criteria. No suspicious hilar or axillary lymphadenopathy.  Degenerative changes of the thoracic spine.  CT ABDOMEN AND PELVIS FINDINGS  Motion degraded images.  Liver, spleen, pancreas, and adrenal glands are within normal limits.  Gallbladder is underdistended. No intrahepatic or extrahepatic ductal dilatation.  Two interpolar right renal cyst measuring 11 mm (series  7/ image 18) and 15 mm (series 7/ image 21). Scattered small left renal cysts measuring up to 10 mm (series 7/ image 20). No hydronephrosis.  No evidence of bowel obstruction. No convincing bowel wall thickening or inflammatory changes.  Atherosclerotic calcifications of the abdominal aorta and branch vessels.  No abdominopelvic ascites.  No suspicious abdominopelvic lymphadenopathy.  Prostate is notable for enlargement of the central gland which indents the base of the bladder.  Bladder is within normal limits.  Degenerative changes of the lumbar spine.  IMPRESSION: Bilateral lobar and segmental pulmonary emboli, as described above. Overall clot burden is moderate.  Secondary findings worrisome for right heart strain.  Small left and trace right pleural effusions.  Critical value/emergent results were called by telephone at the time of interpretation on 03/08/2013 at 4:27 PM to Dr.CORNELIUS VAN DAM , who  verbally acknowledged these results.   Electronically Signed   By: Charline Bills M.D.   On: 03/08/2013 16:43   Ir Ivc Filter Plmt / S&i /img Guid/mod Sed  03/09/2013   CLINICAL DATA:  Pulmonary thromboembolism.  Hemorrhage.  EXAM: IVC FILTER,INFERIOR VENA CAVOGRAM  MEDICATIONS AND MEDICAL HISTORY: Versed none mg, Fentanyl 25 mcg.  Additional Medications: None.  ANESTHESIA/SEDATION: Moderate sedation time: 12 minutes  CONTRAST:  Carbon dioxide  FLUOROSCOPY TIME:  1 min and 6 seconds.  PROCEDURE: The procedure, risks, benefits, and alternatives were explained to the patient. Questions regarding the procedure were encouraged and answered. The patient understands and consents to the procedure.  The right neck was prepped with Betadine in a sterile fashion, and a sterile drape was applied covering the operative field. A sterile gown and sterile gloves were used for the procedure.  The right internal jugular vein was noted to be patent initially with ultrasound. Under sonographic guidance, a micropuncture needle was inserted into the right internal jugular vein (Ultrasound image documentation was performed). It was removed over an 018 wire which was upsized to a [place]. The sheath was inserted over the wire and into the IVC. Carbon dioxide IVC venography was performed.  The temporary filter was then deployed in the infrarenal IVC. The sheath was removed and hemostasis was achieved with direct pressure.  COMPLICATIONS: None  FINDINGS: Carbon dioxide venography demonstrates common iliac vein confluence and renal vein inflow. No obvious DVT. These findings were corroborated with the recent CT chest which extended through the abdomen.  The image demonstrates placement of an IVC filter with its tip at the lower L1 vertebral body endplate. Please note that L5 is sacralized and there are rudimentary ribs at T12. Lumbar anatomy is transitional.  IMPRESSION: Successful infrarenal IVC filter placement. This is a temporary  filter. It can be removed or remain in place to become permanent.   Electronically Signed   By: Maryclare Bean M.D.   On: 03/09/2013 10:31   Dg Chest Portable 1 View  02/28/2013   CLINICAL DATA:  Altered mental status.  EXAM: PORTABLE CHEST - 1 VIEW  COMPARISON:  None.  FINDINGS: The heart size and mediastinal contours are within normal limits. Both lungs are clear. The visualized skeletal structures are unremarkable.  IMPRESSION: No active disease.   Electronically Signed   By: Tiburcio Pea M.D.   On: 02/28/2013 23:03    Microbiology: Recent Results (from the past 240 hour(s))  CLOSTRIDIUM DIFFICILE BY PCR     Status: None   Collection Time    03/11/13  9:30 PM      Result Value Range Status  C difficile by pcr NEGATIVE  NEGATIVE Final     Labs: Basic Metabolic Panel:  Recent Labs Lab 03/09/13 0952 03/12/13 0605  NA 134* 137  K 3.9 3.8  CL 101 105  CO2 22 23  GLUCOSE 134* 88  BUN 16 8  CREATININE 0.76 0.64  CALCIUM 8.7 8.3*   Liver Function Tests:  Recent Labs Lab 03/09/13 0952  AST 39*  ALT 31  ALKPHOS 109  BILITOT 0.6  PROT 5.6*  ALBUMIN 2.3*   No results found for this basename: LIPASE, AMYLASE,  in the last 168 hours No results found for this basename: AMMONIA,  in the last 168 hours CBC:  Recent Labs Lab 03/12/13 0605  WBC 10.4  NEUTROABS 7.8*  HGB 12.2*  HCT 35.2*  MCV 92.4  PLT 215   Cardiac Enzymes:  Recent Labs Lab 03/09/13 0438  CKTOTAL 100   BNP: BNP (last 3 results) No results found for this basename: PROBNP,  in the last 8760 hours CBG:  Recent Labs Lab 03/09/13 1105  GLUCAP 119*       SignedRhetta Mura  [facility] 03/15/2013, 2:33 PM

## 2013-03-15 NOTE — Progress Notes (Signed)
Physical Therapy Treatment Patient Details Name: Walter Larsen MRN: [id number] DOB: Aug 16, 1935 Today's Date: 03/15/2013 Time: 4098-1191 PT Time Calculation (min): 26 min  PT Assessment / Plan / Recommendation  History of Present Illness Walter Larsen is a 77 y.o. male with Past medical history of hypertension, dyslipidemia, recent admission to [facility] with hemorrhagic stroke. The patient is presenting with complaints of altered mental status. He was brought in by his family. As per the family since last one week he had significant headache, after which he was taken to [facility] for hemorrhagic stroke. From where he was discharged day before yesterday. Today he has been having hallucinations and confabulation. He was talking about basketball match but there was none. He also complained of some further frontal headache and some nausea. There were episodes of staring during which he would take a while to respond. And also he had an episode of urinary incontinence. By the EMS arrived he was found to be febrile as well.   PT Comments   Pt progressing with ambulation today however becomes unsafe despite cues for safety due to LBP. Pt is progressing with bed mobility and is completing bed mobility tasks with decreased time needed. Pt would benefit from CIR to increase functional independence prior to d/c home.  Follow Up Recommendations  CIR     Does the patient have the potential to tolerate intense rehabilitation     Barriers to Discharge        Equipment Recommendations       Recommendations for Other Services Rehab consult  Frequency Min 3X/week   Progress towards PT Goals Progress towards PT goals: Progressing toward goals  Plan Current plan remains appropriate    Precautions / Restrictions Precautions Precautions: Fall Restrictions Weight Bearing Restrictions: No   Pertinent Vitals/Pain 8-9/10 LBP; nursing notified     Mobility  Bed Mobility Bed Mobility: Right  Sidelying to Sit;Sitting - Scoot to Edge of Bed Right Sidelying to Sit: With rails;4: Min guard Sitting - Scoot to [place] of Bed: With rail;4: Min guard Details for Bed Mobility Assistance: vc's for technique and to decrease use of rail.  Transfers Transfers: Sit to Stand;Stand to Sit Sit to Stand: 4: Min guard;With upper extremity assist;From bed Stand to Sit: To chair/3-in-1;4: Min guard;With upper extremity assist Details for Transfer Assistance: Sit<>stand x5 from bed and recliner with decreased need for vc's for hand placement due to increased carryover. Ambulation/Gait Ambulation/Gait Assistance: 4: Min guard Ambulation Distance (Feet): 70 Feet Ambulation/Gait Assistance Details: Pt ambulated 50' and required seated rest break due to LBP. Pt able to ambulate 20' prior to needing 2nd rest break. Pt becomes unsteady  and increases distance between himself and RW despite cues due to LBP. Pt requires cues to take seated rest break instead of continuing when becoming usafe.  Gait Pattern: Step-through pattern;Decreased stride length;Trunk flexed Gait velocity: decreased    Exercises     PT Diagnosis:    PT Problem List:   PT Treatment Interventions:     PT Goals (current goals can now be found in the care plan section)    Visit Information  Last PT Received On: 03/15/13 Assistance Needed: +1 History of Present Illness: Walter Larsen is a 77 y.o. male with Past medical history of hypertension, dyslipidemia, recent admission to [facility] with hemorrhagic stroke. The patient is presenting with complaints of altered mental status. He was brought in by his family. As per the family since last one week he  had significant headache, after which he was taken to [facility] for hemorrhagic stroke. From where he was discharged day before yesterday. Today he has been having hallucinations and confabulation. He was talking about basketball match but there was none. He also complained  of some further frontal headache and some nausea. There were episodes of staring during which he would take a while to respond. And also he had an episode of urinary incontinence. By the EMS arrived he was found to be febrile as well.    Subjective Data      Cognition  Cognition Arousal/Alertness: Awake/alert Behavior During Therapy: Flat affect Overall Cognitive Status: Impaired/Different from baseline Area of Impairment: Following commands;Problem solving Current Attention Level: Selective Following Commands: Follows one step commands inconsistently Problem Solving: Slow processing;Requires verbal cues;Requires tactile cues    Balance     End of Session PT - End of Session Equipment Utilized During Treatment: Gait belt Activity Tolerance: Patient limited by fatigue;Patient limited by pain Patient left: with call bell/phone within reach;in chair;with family/visitor present Nurse Communication: Mobility status;Patient requests pain meds   GP     Ernestina Columbia, SPTA 03/15/2013, 8:56 AM

## 2013-03-15 NOTE — Progress Notes (Signed)
UR complete.  Brendaliz Kuk RN, MSN 

## 2013-03-15 NOTE — Progress Notes (Signed)
Insurance has approved and bed is available for admission to inpt rehab today. [phone]

## 2013-03-15 NOTE — Progress Notes (Signed)
03/15/2013 Fredrich Birks PTA [phone] pager [phone] office

## 2013-03-15 NOTE — H&P (Signed)
Physical Medicine and Rehabilitation Admission H&P  Chief Complaint   Patient presents with   .  Altered Mental Status   :  Chief complaint: Altered mental status  HPI: Walter Larsen is a 77 y.o. right-handed male with history of hypertension, CAD with PTCA. Admit 03/01/2013 with altered mental status and fever. Recent admission to [facility] with hemorrhagic stroke and discharged 02/27/2013 after a three-day hospital stay. Presented with increased headache and nausea as well as hallucinations and altered mental status. Cranial CT scan showed right temporal occipital parenchymal hematoma measuring up to 7 cm in light of trace intraventricular extension without hydrocephalus. Patient was placed on vancomycin /Zosyn for fever of 100.3 white blood cell count 15,000. An MRI was completed 03/05/2013 during workup of fever rule out abscess that showed a large subacute hematoma right temporoparietal lobe as well small areas of acute infarct in the right occipital parietal lobe and left temporal parietal lobe. Neurosurgery Dr. Danielle Dess consulted advise conservative care in reference of right parietal ICH. TEE completed showing no thrombus without PFO no vegetation. Infectious disease consult and for fever of unknown origin with workup ongoing including CT of the chest 03/08/2013 showed bilateral lobar and segmental pulmonary emboli and bilateral lower extremity Doppler studies consistent with deep vein thrombosis involving the right common femoral vein and right peroneal vein as well as deep vein thrombosis left peroneal vein. Underwent removable Greenfield filter 03/08/2012 per interventional radiology. Depakote was added for headache as well as seizure prevention. Bouts of urinary retention Foley catheter tube inserted x2. Plan is to try a voiding trial as mobility improves. Patient is on a regular consistency diet. Physical and occupational therapy evaluations completed 03/08/2013 with recommendations of  physical medicine rehabilitation consult to consider inpatient rehabilitation services. Patient was felt to be a good candidate for inpatient rehabilitation services was admitted for comprehensive rehabilitation program  ROS Review of Systems  Gastrointestinal: Positive for nausea.  Neurological: Positive for headaches.  Psychiatric/Behavioral: Positive for memory loss.  All other systems reviewed and are negative  Past Medical History   Diagnosis  Date   .  Stroke    .  Coronary artery disease      3 stents    Past Surgical History   Procedure  Laterality  Date   .  Tee without cardioversion  N/A  03/07/2013     Procedure: TRANSESOPHAGEAL ECHOCARDIOGRAM (TEE); Surgeon: Pricilla Riffle, MD; Location: [facility] ENDOSCOPY; Service: Cardiovascular; Laterality: N/A;    Family History   Problem  Relation  Age of Onset   .  Hypertension  Mother    .  Hypertension  Father     Social History: reports that he has never smoked. He does not have any smokeless tobacco history on file. He reports that he does not drink alcohol or use illicit drugs.  Allergies:  Allergies   Allergen  Reactions   .  Ativan [Lorazepam]      confusion    Medications Prior to Admission   Medication  Sig  Dispense  Refill   .  atorvastatin (LIPITOR) 20 MG tablet  Take 20 mg by mouth at bedtime.     Marland Kitchen  esomeprazole (NEXIUM) 40 MG capsule  Take 40 mg by mouth daily before breakfast.     .  fenofibrate (TRICOR) 145 MG tablet  Take 145 mg by mouth at bedtime.     .  Glucosamine-Chondroitin (OSTEO BI-FLEX REGULAR STRENGTH PO)  Take 1 tablet by mouth daily with breakfast.     .  metoprolol (LOPRESSOR) 50 MG tablet  Take 50 mg by mouth daily with lunch.     .  Multiple Vitamin (MULTIVITAMIN WITH MINERALS) TABS tablet  Take 1 tablet by mouth daily with breakfast.     .  Omega-3 Fatty Acids (FISH OIL PO)  Take 1 capsule by mouth daily with breakfast.     .  [DISCONTINUED] aspirin 325 MG tablet  Take 325 mg by mouth daily with lunch.       Home:  Home Living  Family/patient expects to be discharged to:: Private residence  Living Arrangements: Spouse/significant other  Available Help at Discharge: Family  Type of Home: House  Home Access: Stairs to enter  Secretary/administrator of Steps: 4  Entrance Stairs-Rails: Right;Left  Home Layout: One level  Home Equipment: None  Lives With: Spouse  Functional History:   Functional Status:  Mobility:  Bed Mobility  Bed Mobility: Right Sidelying to Sit;Sitting - Scoot to [place] of Bed  Rolling Right: 3: Mod assist;4: Min guard (Min guard rolling to get out of bed)  Rolling Left: 3: Mod assist  Right Sidelying to Sit: With rails;4: Min guard  Left Sidelying to Sit: 1: +2 Total assist;HOB flat  Left Sidelying to Sit: Patient Percentage: 20%  Supine to Sit: 4: Min guard;HOB elevated  Sitting - Scoot to Edge of Bed: With rail;4: Min guard  Sit to Sidelying Right: 3: Mod assist  Sit to Sidelying Left: 2: Max assist  Sit to Sidelying Left: Patient Percentage: 20%  Transfers  Transfers: Sit to Stand;Stand to Sit  Sit to Stand: 4: Min guard;With upper extremity assist;From bed  Sit to Stand: Patient Percentage: 40%  Stand to Sit: To chair/3-in-1;4: Min guard;With upper extremity assist  Stand to Sit: Patient Percentage: 40%  Stand Pivot Transfers: 1: +2 Total assist  Stand Pivot Transfers: Patient Percentage: 50%  Ambulation/Gait  Ambulation/Gait Assistance: 4: Min guard  Ambulation/Gait: Patient Percentage: 60%  Ambulation Distance (Feet): 70 Feet  Assistive device: Rolling walker  Ambulation/Gait Assistance Details: Pt ambulated 50' and required seated rest break due to LBP. Pt able to ambulate 20' prior to needing 2nd rest break. Pt becomes unsteady and increases distance between himself and RW despite cues due to LBP. Pt requires cues to take seated rest break instead of continuing when becoming usafe.  Gait Pattern: Step-through pattern;Decreased stride length;Trunk  flexed  Gait velocity: decreased   ADL:  ADL  Eating/Feeding: Independent  Where Assessed - Eating/Feeding: Chair  Grooming: Teeth care;Wash/dry face;Brushing hair;Minimal assistance  Where Assessed - Grooming: Supported standing;Supported sitting  Upper Body Bathing: Minimal assistance  Where Assessed - Upper Body Bathing: Supported sitting  Lower Body Bathing: +2 Total assistance  Where Assessed - Lower Body Bathing: Supported sit to stand  Upper Body Dressing: Minimal assistance  Where Assessed - Upper Body Dressing: Supported sitting  Lower Body Dressing: +2 Total assistance  Where Assessed - Lower Body Dressing: Supported sit to Scientist, research (life sciences): Min guard;Moderate assistance (Min guard/Mod A-sit to stand; +2 Total A for stand pivot)  Toilet Transfer Method: Sit to stand;Stand pivot  Toilet Transfer Equipment: Bedside commode  Tub/Shower Transfer Method: Not assessed  Equipment Used: Gait belt;Rolling walker  Transfers/Ambulation Related to ADLs: Min guard ambulating to bathroom at beginning of session; +2 Total A for safety to stand pivot back to recliner chair from [facility] as pt was feeling dizzy. Pt performed stand pivot later in session at [facility] guard level.  ADL Comments: Pt stood at sink  to perform grooming tasks. Pt had very difficult time locating items on left side of sink. OT had to physically move pt's head to left side due to him not responding to cues. Educated family to sit on left side and also to present things to him on left to force him to look that way so he can learn to compensate. Educated pt on energy conservation as he was fatigued during grooming tasks and also educated family as well. Educated on chair push ups to increase pt's strength in upper extremities.  Cognition:  Cognition  Overall Cognitive Status: Impaired/Different from baseline  Orientation Level: Oriented to person;Oriented to place  Cognition  Arousal/Alertness: Awake/alert  Behavior During  Therapy: Flat affect  Overall Cognitive Status: Impaired/Different from baseline  Area of Impairment: Following commands;Problem solving  Current Attention Level: Selective  Following Commands: Follows one step commands inconsistently  Problem Solving: Slow processing;Requires verbal cues;Requires tactile cues    Physical Exam:  Blood pressure 126/58, pulse 80, temperature 98 F (36.7 C), temperature source Oral, resp. rate 19, height 5\' 8"  (1.727 m), weight 81.647 kg (180 lb), SpO2 95.00%.  General: sleeping but arouses easily.   HENT: dentition fair, oral mucosa pink and moist Head: Normocephalic.  Eyes: EOM are normal.  No nystagmus  Neck: Normal range of motion. Neck supple. No thyromegaly present.  Cardiovascular: Regular rhythm. No murmurs, rubs, gallops Respiratory: Effort normal and breath sounds normal. No respiratory distress. No wheezes, rales, rhonchi GI: Soft. Bowel sounds are normal. He exhibits no distension.  Neurological: He was fairly alert once aroused.  His moood is flat but appropriate. He makes good eye contact with examiner. He was able to provide his name, age. He did follow simple commands. Oriented to name, place, month, reason he's here.  Skin: Skin is warm and dry.  motor strength: 5/5 in the right deltoid, bicep, tricep, grip, hip flexor, knee extensors, ankle dorsiflexor plantar flexor  3+/5 in the left deltoid, bicep, tricep, grip. He is 3_ to 4- in left hip flexor, knee extensors, ankle dorsi flexion plantar flexor  Sensation intact to light touch and pain Cerebellar shows mild dysmetria finger-nose-finger testing on the left upper extremity Visual fields show left field cut on confrontational testing--this does not appear complete, but at least 50% (outer half) of left visual field. Appears to have intact basic insight and awareness.     Post Admission Physician Evaluation:  1. Functional deficits secondary to right temporal-parietal intracranial  hemorrhage, right occipital infarct 2. Patient is admitted to receive collaborative, interdisciplinary care between the physiatrist, rehab nursing staff, and therapy team. 3. Patient's level of medical complexity and substantial therapy needs in context of that medical necessity cannot be provided at a lesser intensity of care such as a SNF. 4. Patient has experienced substantial functional loss from his/her baseline which was documented above under the "Functional History" and "Functional Status" headings. Judging by the patient's diagnosis, physical exam, and functional history, the patient has potential for functional progress which will result in measurable gains while on inpatient rehab. These gains will be of substantial and practical use upon discharge in facilitating mobility and self-care at the household level. 5. Physiatrist will provide 24 hour management of medical needs as well as oversight of the therapy plan/treatment and provide guidance as appropriate regarding the interaction of the two. 6. 24 hour rehab nursing will assist with bladder management, bowel management, safety, skin/wound care, disease management, medication administration, pain management and patient education and help integrate  therapy concepts, techniques,education, etc. 7. PT will assess and treat for/with: Lower extremity strength, range of motion, stamina, balance, functional mobility, safety, adaptive techniques and equipment, NMR, visual-perceptual awareness. Goals are: supervision to mod I. 8. OT will assess and treat for/with: ADL's, functional mobility, safety, upper extremity strength, adaptive techniques and equipment, NMR, visual-perceptual awareness. Goals are: supervision to mod I. 9. SLP will assess and treat for/with: cognition, communication. Goals are: mod I. 10. Case Management and Social Worker will assess and treat for psychological issues and discharge planning. 11. Team conference will be held weekly  to assess progress toward goals and to determine barriers to discharge. 12. Patient will receive at least 3 hours of therapy per day at least 5 days per week. 13. ELOS: 10-14 days  14. Prognosis: excellent   Medical Problem List and Plan:  1. Right temporal parietal intracranial hemorrhage as well as right occipital infarct  2. DVT Prophylaxis/Anticoagulation: Pulmonary emboli/DVT right common femoral vein and peroneal vein, left peroneal vein. Status post Greenfield filter 03/08/2013  3. Pain Management/headaches. Depakote 750 mg daily  4. Neuropsych: This patient is  capable of making decisions on his own behalf.  5. ID/fever. All antibiotics discontinued 03/08/2013 with workup unremarkable except for signs of DVT /PE  6. Hypertension. Norvasc 5 mg daily, Lopressor 50 mg twice a day. Monitor with increased mobility  7. Hyperlipidemia. Lipitor  8. BPH. Flomax. Check PVRs x3  9. CAD/PTCA. Aspirin prior to admission discontinued secondary to hemorrhage. No chest pain or shortness of breath  10. Urinary retention. Foley catheter tube reinserted x2. Will attempt voiding trial as mobility improves   Ranelle Oyster, MD, [facility] Physical Medicine & Rehabilitation   03/15/2013

## 2013-03-16 ENCOUNTER — Inpatient Hospital Stay (HOSPITAL_COMMUNITY): Payer: Medicare Other

## 2013-03-16 ENCOUNTER — Inpatient Hospital Stay (HOSPITAL_COMMUNITY): Payer: Medicare Other | Admitting: Occupational Therapy

## 2013-03-16 DIAGNOSIS — I69998 Other sequelae following unspecified cerebrovascular disease: Secondary | ICD-10-CM

## 2013-03-16 DIAGNOSIS — R209 Unspecified disturbances of skin sensation: Secondary | ICD-10-CM

## 2013-03-16 DIAGNOSIS — I619 Nontraumatic intracerebral hemorrhage, unspecified: Secondary | ICD-10-CM

## 2013-03-16 LAB — CBC WITH DIFFERENTIAL/PLATELET
Eosinophils Absolute: 0.2 10*3/uL (ref 0.0–0.7)
Eosinophils Relative: 2 % (ref 0–5)
HCT: 35.3 % — ABNORMAL LOW (ref 39.0–52.0)
Hemoglobin: 11.8 g/dL — ABNORMAL LOW (ref 13.0–17.0)
Lymphocytes Relative: 13 % (ref 12–46)
Lymphs Abs: 2 10*3/uL (ref 0.7–4.0)
MCH: 31.1 pg (ref 26.0–34.0)
MCV: 93.1 fL (ref 78.0–100.0)
Monocytes Absolute: 1.9 10*3/uL — ABNORMAL HIGH (ref 0.1–1.0)
Monocytes Relative: 13 % — ABNORMAL HIGH (ref 3–12)
RBC: 3.79 MIL/uL — ABNORMAL LOW (ref 4.22–5.81)
WBC: 15 10*3/uL — ABNORMAL HIGH (ref 4.0–10.5)

## 2013-03-16 LAB — COMPREHENSIVE METABOLIC PANEL
BUN: 13 mg/dL (ref 6–23)
Calcium: 9.3 mg/dL (ref 8.4–10.5)
Creatinine, Ser: 0.76 mg/dL (ref 0.50–1.35)
GFR calc Af Amer: >90 mL/min (ref >90)
Glucose, Bld: 97 mg/dL (ref 70–99)
Total Protein: 6.8 g/dL (ref 6.0–8.3)

## 2013-03-16 NOTE — Progress Notes (Signed)
Patient working with therapist Rayfield Citizen PT during session Rayfield Citizen reports patient became dizzy, less response for approximately 30 seconds and  while sitting in chair had some jerky movements and blood pressure 103/54. Patient brought back to room and placed in bed. Upon assessment patient sleeping, easily arousable. Patient without TED hose, placed on patient by NT, thigh high teds, Dan Anguilli, PA made aware of events, no new orders at this time. Roberts-VonCannon, Thanya Cegielski Elon Jester

## 2013-03-16 NOTE — Progress Notes (Signed)
Patient with Foley catheter to straight drain, no order for catheter, placed on 03/11/13 on acute after being unable to void. Contacted Harvel Ricks, PA to discuss urinary retention concern, order received to discontinue the foley and give 8 hours to void and scan if voids, if greater than 350 may in and out cath patient, patient and wife aware of plan, discussed with this writer and Harvel Ricks, Pa, foley removed by NTJoni Reining, urinal at bedside.Patient tolerated well. Roberts-VonCannon, Lyndell Allaire Elon Jester

## 2013-03-16 NOTE — Evaluation (Signed)
Speech Language Pathology Assessment and Plan  Patient Details  Name: Walter Larsen MRN: [id number] Date of Birth: Feb 25, 1936  SLP Diagnosis: Cognitive Impairments;Speech and Language deficits;Aphasia  Rehab Potential: Good ELOS: 12-15 days   Today's Date: 03/16/2013 Time: 1500-1550 Time Calculation (min): 50 min  Problem List:  Patient Active Problem List   Diagnosis Date Noted  . ICH (intracerebral hemorrhage) 03/15/2013  . Dry eyes 03/08/2013  . Hyponatremia 03/08/2013  . Headache 03/08/2013  . Acute encephalopathy 03/08/2013  . Pulmonary emboli 03/08/2013  . Other and unspecified hyperlipidemia 03/02/2013  . Bradycardia 03/02/2013  . Hypokalemia 03/02/2013  . Hemorrhagic stroke 03/01/2013  . Essential hypertension, benign 03/01/2013  . Fever, unspecified 03/01/2013   Past Medical History:  Past Medical History  Diagnosis Date  . Stroke   . Coronary artery disease     3 stents   Past Surgical History:  Past Surgical History  Procedure Laterality Date  . Tee without cardioversion N/A 03/07/2013    Procedure: TRANSESOPHAGEAL ECHOCARDIOGRAM (TEE);  Surgeon: Pricilla Riffle, MD;  Location: [facility] ENDOSCOPY;  Service: Cardiovascular;  Laterality: N/A;    Assessment / Plan / Recommendation Clinical Impression  Pt is a 77 y.o. right-handed male with history of hypertension, CAD with PTCA. Admit 03/01/13 with AMS and fever. Recent admission to [facility] with hemorrhagic stroke and discharged 02/27/13 after a 3-day hospital stay. Presented with increased headache and nausea as well as hallucinations and AMS. Cranial CT scan showed right temporal occipital parenchymal hematoma measuring up to 7 cm in light of trace intraventricular extension without hydrocephalus. An MRI was completed 03/05/13 during w/u of fever rule out abscess that showed a large subacute hematoma right temporoparietal lobe as well small areas of acute infarct in the right occipital parietal lobe and left  temporal parietal lobe. Neurosurgery Dr. Danielle Dess consulted advise conservative care in reference of right parietal ICH. Infectious disease consult and for fever of unknown origin with workup ongoing including CT of the chest 03/08/2013 showed bilateral lobar and segmental pulmonary emboli and bilateral lower extremity Doppler studies consistent with deep vein thrombosis involving the right common femoral vein and right peroneal vein as well as deep vein thrombosis left peroneal vein. Underwent removable Greenfield filter 03/08/12. Depakote was added for headache as well as seizure prevention. Bouts of urinary retention Foley catheter tube inserted x2. Patient is on a regular consistency diet. PT/OT evaluations completed 03/08/13 with recommendations of physical medicine rehabilitation consult to consider inpatient rehabilitation services. Patient was felt to be a good candidate for inpatient rehabilitation services was admitted for comprehensive rehabilitation program 12/2. SLP cognitive-linguistic evaluation was administered 12/3, and revealed decreased level of alertness impacting all other areas of cognition, including focused and sustained attention, intellectual awareness, and orientation. Pt with linguistic impairments including decreased ability to follow commands, impaired confrontational naming, and language of confusion. Pt's performance was also marked by perseverative errors. Speech was clear and intelligible. Pt will benefit from skilled SLP services to maximize functional communication and cognitive functioning prior to discharge.    SLP Assessment  Patient will need skilled Speech Lanaguage Pathology Services during CIR admission    Recommendations       SLP Frequency 5 out of 7 days   SLP Treatment/Interventions Cognitive remediation/compensation;Cueing hierarchy;Environmental controls;Functional tasks;Internal/external aids;Patient/family education;Speech/Language facilitation     Pain Pain Assessment Pain Assessment: Faces Faces Pain Scale: No hurt Prior Functioning Cognitive/Linguistic Baseline: Within functional limits Type of Home: House  Lives With: Spouse Available Help at Discharge: Family  Vocation: Part time employment (painting houses)  Short Term Goals: Week 1: SLP Short Term Goal 1 (Week 1): Pt will sustain attention to basic, structured task for 60 seconds with Max cues SLP Short Term Goal 2 (Week 1): Pt will follow one-step commands with 80% accuracy with Max cues SLP Short Term Goal 3 (Week 1): Pt will answer basic yes/no questions with 80% accuracy with Max cues SLP Short Term Goal 4 (Week 1): Pt will utilize external aids to demonstrate orientation x4 with Max cues SLP Short Term Goal 5 (Week 1): Pt will name common objects with 80% accuracy with Max cues  See FIM for current functional status Refer to Care Plan for Long Term Goals  Recommendations for other services: None  Discharge Criteria: Patient will be discharged from SLP if patient refuses treatment 3 consecutive times without medical reason, if treatment goals not met, if there is a change in medical status, if patient makes no progress towards goals or if patient is discharged from hospital.  The above assessment, treatment plan, treatment alternatives and goals were discussed and mutually agreed upon: by patient and by family   Maxcine Ham, M.A. CCC-SLP [phone]   Maxcine Ham 03/16/2013, 4:55 PM

## 2013-03-16 NOTE — Evaluation (Signed)
Physical Therapy Assessment and Plan  Patient Details  Name: Walter Larsen MRN: [id number] Date of Birth: 22-Jul-1935  PT Diagnosis: Cognitive deficits, Difficulty walking, Dizziness and giddiness, Edema, Hemiparesis non-dominant, Hypertonia, Impaired cognition and Muscle weakness Rehab Potential: Good ELOS: 14-18 days   Today's Date: 03/16/2013 Time: 1500-1550 Time Calculation (min): 50 min  Problem List:  Patient Active Problem List   Diagnosis Date Noted  . ICH (intracerebral hemorrhage) 03/15/2013  . Dry eyes 03/08/2013  . Hyponatremia 03/08/2013  . Headache 03/08/2013  . Acute encephalopathy 03/08/2013  . Pulmonary emboli 03/08/2013  . Other and unspecified hyperlipidemia 03/02/2013  . Bradycardia 03/02/2013  . Hypokalemia 03/02/2013  . Hemorrhagic stroke 03/01/2013  . Essential hypertension, benign 03/01/2013  . Fever, unspecified 03/01/2013    Past Medical History:  Past Medical History  Diagnosis Date  . Stroke   . Coronary artery disease     3 stents   Past Surgical History:  Past Surgical History  Procedure Laterality Date  . Tee without cardioversion N/A 03/07/2013    Procedure: TRANSESOPHAGEAL ECHOCARDIOGRAM (TEE);  Surgeon: Pricilla Riffle, MD;  Location: [facility] ENDOSCOPY;  Service: Cardiovascular;  Laterality: N/A;    Assessment & Plan Clinical Impression: Walter Larsen is a 77 y.o. right-handed male with history of hypertension, CAD with PTCA. Admit 03/01/2013 with altered mental status and fever. Recent admission to [facility] with hemorrhagic stroke and discharged 02/27/2013 after a three-day hospital stay.  Cranial CT scan showed right temporal occipital parenchymal hematoma measuring up to 7 cm in light of trace intraventricular extension without hydrocephalus.  An MRI was completed 03/05/2013 during workup of fever rule out abscess that showed a large subacute hematoma right temporoparietal lobe as well small areas of acute infarct in the right occipital  parietal lobe and left temporal parietal lobe.  Infectious disease consult and for fever of unknown origin with workup ongoing including CT of the chest 03/08/2013 showed bilateral lobar and segmental pulmonary emboli and bilateral lower extremity Doppler studies consistent with deep vein thrombosis involving the right common femoral vein and right peroneal vein as well as deep vein thrombosis left peroneal vein. Underwent removable Greenfield filter 03/08/2012 per interventional radiology. Depakote was added for headache as well as seizure prevention. Bouts of urinary retention Foley catheter tube inserted x2..  Patient transferred to CIR on 03/15/2013 .   Patient currently requires mod with mobility secondary to decreased cardiorespiratoy endurance, impaired timing and sequencing, abnormal tone and decreased motor planning, field cut, decreased attention to left and decreased initiation, decreased attention, decreased awareness, decreased problem solving, decreased safety awareness and delayed processing.  Prior to hospitalization, patient was independent  with mobility and lived with Spouse in a House home.  Home access is 4Stairs to enter.  Patient will benefit from skilled PT intervention to maximize safe functional mobility, minimize fall risk and decrease caregiver burden for planned discharge home with 24 hour supervision.  Anticipate patient will benefit from follow up HH at discharge. PT - End of Session Activity Tolerance: Tolerates < 10 min activity with changes in vital signs Endurance Deficit: Yes Endurance Deficit Description: dizzy due to LBP; fatigued easily PT Assessment Rehab Potential: Good Barriers to Discharge: Decreased caregiver support PT Patient demonstrates impairments in the following area(s): Balance;Behavior;Edema;Endurance;Motor;Pain;Perception;Safety;Sensory PT Transfers Functional Problem(s): Bed Mobility;Bed to Chair;Car;Furniture PT Locomotion Functional Problem(s):  Ambulation;Wheelchair Mobility;Stairs PT Plan PT Intensity: Minimum of 1-2 x/day ,45 to 90 minutes PT Frequency: 5 out of 7 days PT Duration Estimated Length of Stay:  14-18 days PT Treatment/Interventions: Ambulation/gait training;Balance/vestibular training;Cognitive remediation/compensation;Discharge planning;Neuromuscular re-education;Functional mobility training;DME/adaptive equipment instruction;Pain management;Patient/family education;Splinting/orthotics;UE/LE Coordination activities;UE/LE Strength taining/ROM;Therapeutic Exercise;Therapeutic Activities;Stair training;Visual/perceptual remediation/compensation;Wheelchair propulsion/positioning PT Transfers Anticipated Outcome(s): supervision PT Locomotion Anticipated Outcome(s): supervision w/c propulsion x 150', and gait x 50', up/down 4 steps 2 rails, min assist PT Recommendation Follow Up Recommendations: Home health PT Patient destination: Home Equipment Recommended: Rolling walker with 5" wheels  Skilled Therapeutic Intervention Tx today: self care- pt washed face after being handed wet washcloth.  It did increase arousal. He was safe and appropriate with this task.  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  He was always very active, still working part time.  Pt was exhausted after evaluation; transferred to bed with mod assist but required max assist to lie down possibly due to low back pain/motor planning and was asleep in less than 1 minute.  PT Evaluation Precautions/Restrictions Precautions Precautions: Fall Restrictions Weight Bearing Restrictions: No General   Vital SignsTherapy Vitals Temp: 99.2 F (37.3 C) Temp src: Oral Pulse Rate: 64 Resp: 18 BP: 103/54 mmHg Patient Position, if appropriate: sitting Oxygen Therapy SpO2: 96 % O2 Device: None (Room air) Pain Pain Assessment Pain Assessment: No/denies pain Home Living/Prior Functioning Home  Living Available Help at Discharge: Family Type of Home: House Home Access: Stairs to enter Secretary/administrator of Steps: 4 Entrance Stairs-Rails: Right;Left Home Layout: One level Prior Function Level of Independence: Independent with homemaking with ambulation;Independent with gait  Able to Take Stairs?: Yes Vocation: Part time employment Vocation Requirements: Surveyor, minerals  Vision/Perception  Vision - Assessment Vision Assessment:  (pt kept eyes closed during majority of eval) Tracking/Visual Pursuits: Other (comment) Visual Fields: Left visual field deficit  Cognition Overall Cognitive Status: Impaired/Different from baseline Orientation Level: Oriented to person;Oriented to situation;Disoriented to place;Disoriented to time Behaviors: Perseveration Safety/Judgment: Impaired Sensation Sensation Light Touch: Appears Intact Proprioception: Not tested Coordination Heel Shin Test: not formally tested, but pt able to cross LEs to don socks with assistance Motor  Motor Motor - Skilled Clinical Observations: difficutly relaxing bil LEs/ ? mild hypertonus bil Ues  Mobility Bed Mobility Bed Mobility: Rolling Left;Left Sidelying to Sit;Supine to Sit;Sit to Supine Rolling Left: 4: Min assist Rolling Left Details: Verbal cues for technique;Tactile cues for weight shifting Left Sidelying to Sit: 3: Mod assist Left Sidelying to Sit Details: Manual facilitation for weight shifting;Manual facilitation for placement;Tactile cues for placement;Tactile cues for weight shifting;Verbal cues for technique Supine to Sit: 3: Mod assist Sitting - Scoot to Edge of Bed: 4: Min assist Sit to Supine: HOB flat;2: Max assist Transfers Transfers: Yes Sit to Stand: 4: Min assist Stand to Sit: 4: Min Multimedia programmer Transfers: 4: Min Actuary Details (indicate cue type and reason): cues for reaching back Locomotion  Ambulation Ambulation: Yes Ambulation/Gait Assistance:  3: Mod assist Ambulation Distance (Feet): 8 Feet Assistive device: None Ambulation/Gait Assistance Details: required seated rest break due to dizziness/low BP Gait Gait: Yes Gait Pattern: Impaired Gait Pattern: Narrow base of support;Trunk flexed;Decreased dorsiflexion - left;Decreased dorsiflexion - right;Decreased hip/knee flexion - left;Decreased hip/knee flexion - right;Decreased stride length;Step-through pattern Gait velocity: decreased Stairs / Additional Locomotion Stairs: No Wheelchair Mobility Wheelchair Mobility: Yes Distance: 50'  Trunk/Postural Assessment  Cervical Assessment Cervical Assessment: Within Functional Limits Thoracic Assessment Thoracic Assessment: Within Functional Limits Lumbar Assessment Lumbar Assessment: Exceptions to [facility] (hx spinal stenosis; s/p surgery per wife; ? painful during sit> supine) Postural Control Postural  Control: Deficits on evaluation Protective Responses: not formally tested, but pt slumping forward when fatigued with poor protective response, plus eyes closed majority of eval  Balance Balance Balance Assessed: Yes Static Sitting Balance Static Sitting - Balance Support: Feet supported;No upper extremity supported Static Sitting - Level of Assistance: 5: Stand by assistance Static Sitting - Comment/# of Minutes: 5 minutes Dynamic Sitting Balance Dynamic Sitting - Level of Assistance: 5: Stand by assistance (while donning socks) Static Standing Balance Static Standing - Balance Support: Bilateral upper extremity supported (gait) Static Standing - Level of Assistance: 3: Mod assist Dynamic Standing Balance Dynamic Standing - Level of Assistance: 3: Mod assist (during gait) Extremity Assessment      RLE Assessment RLE Assessment: Exceptions to [facility] (mild edema noted R foot; hamstrings and heel corrd tight) RLE tone comments: questionable slight Increase in tone PF RLE Strength RLE Overall Strength Comments: in sittiing, grossly  4/5 hip flexion, knee ext, ankle DF LLE Assessment LLE Assessment: Exceptions to WFL (HC and HS tight) LLE Tone LLE Tone Comments: questionabl slight increase in tone PF; strength grossly in sitting 4/5 hip flexion, knee ext, ankle DF  FIM:  FIM - Bed/Chair Transfer Bed/Chair Transfer Assistive Devices: HOB elevated Bed/Chair Transfer: 3: Supine > Sit: Mod A (lifting assist/Pt. 50-74%/lift 2 legs;2: Sit > Supine: Max A (lifting assist/Pt. 25-49%);4: Bed > Chair or W/C: Min A (steadying Pt. > 75%);4: Chair or W/C > Bed: Min A (steadying Pt. > 75%) FIM - Locomotion: Wheelchair Distance: 50' Locomotion: Wheelchair: 2: Travels 50 - 149 ft with minimal assistance (Pt.>75%) FIM - Locomotion: Ambulation Locomotion: Ambulation Assistive Devices: Other (comment) (HHA) Ambulation/Gait Assistance: 3: Mod assist Locomotion: Ambulation: 1: Travels less than 50 ft with moderate assistance (Pt: 50 - 74%) FIM - Locomotion: Stairs Locomotion: Stairs: 0: Activity did not occur   Refer to Care Plan for Long Term Goals  Recommendations for other services: None  Discharge Criteria: Patient will be discharged from PT if patient refuses treatment 3 consecutive times without medical reason, if treatment goals not met, if there is a change in medical status, if patient makes no progress towards goals or if patient is discharged from hospital.  The above assessment, treatment plan, treatment alternatives and goals were discussed and mutually agreed upon: by family  Mirra Basilio 03/16/2013, 6:19 PM

## 2013-03-16 NOTE — Care Management Note (Signed)
[facility] Individual Statement of Services  Patient Name:  Walter Larsen  Date:  03/16/2013  Welcome to the [facility].  Our goal is to provide you with an individualized program based on your diagnosis and situation, designed to meet your specific needs.  With this comprehensive rehabilitation program, you will be expected to participate in at least 3 hours of rehabilitation therapies Monday-Friday, with modified therapy programming on the weekends.  Your rehabilitation program will include the following services:  Physical Therapy (PT), Occupational Therapy (OT), Speech Therapy (ST), 24 hour per day rehabilitation nursing, Case Management (Social Worker), Rehabilitation Medicine, Nutrition Services and Pharmacy Services  Weekly team conferences will be held on Wednesday to discuss your progress.  Your Social Worker will talk with you frequently to get your input and to update you on team discussions.  Team conferences with you and your family in attendance may also be held.  Expected length of stay: 2 weeks Overall anticipated outcome: Supervision with cueing  Depending on your progress and recovery, your program may change. Your Social Worker will coordinate services and will keep you informed of any changes. Your Social Worker's name and contact numbers are listed  below.  The following services may also be recommended but are not provided by the [facility]:   Driving Evaluations  Home Health Rehabiltiation Services  Outpatient Rehabilitation Services  Vocational Rehabilitation   Arrangements will be made to provide these services after discharge if needed.  Arrangements include referral to agencies that provide these services.  Your insurance has been verified to be:  [employer] Your primary doctor is:  Dr Alinda Deem  Pertinent information will be shared with your doctor and your insurance company.  Social Worker:   Dossie Der, SW [phone] or [phone]  Information discussed with and copy given to patient by: Lucy Chris, 03/16/2013, 2:58 PM

## 2013-03-16 NOTE — Evaluation (Signed)
Occupational Therapy Assessment and Plan  Patient Details  Name: Walter Larsen MRN: [id number] Date of Birth: Apr 18, 1935  OT Diagnosis: cognitive deficits, disturbance of vision and muscle weakness (generalized) Rehab Potential: Rehab Potential: Good ELOS: 12- 14 days   Today's Date: 03/16/2013 Time: 3086-5784 Time Calculation (min): 60 min  Problem List:  Patient Active Problem List   Diagnosis Date Noted  . ICH (intracerebral hemorrhage) 03/15/2013  . Dry eyes 03/08/2013  . Hyponatremia 03/08/2013  . Headache 03/08/2013  . Acute encephalopathy 03/08/2013  . Pulmonary emboli 03/08/2013  . Other and unspecified hyperlipidemia 03/02/2013  . Bradycardia 03/02/2013  . Hypokalemia 03/02/2013  . Hemorrhagic stroke 03/01/2013  . Essential hypertension, benign 03/01/2013  . Fever, unspecified 03/01/2013    Past Medical History:  Past Medical History  Diagnosis Date  . Stroke   . Coronary artery disease     3 stents   Past Surgical History:  Past Surgical History  Procedure Laterality Date  . Tee without cardioversion N/A 03/07/2013    Procedure: TRANSESOPHAGEAL ECHOCARDIOGRAM (TEE);  Surgeon: Pricilla Riffle, MD;  Location: [facility] ENDOSCOPY;  Service: Cardiovascular;  Laterality: N/A;    Assessment & Plan Clinical Impression:  77 y.o. right-handed male with history of hypertension, CAD with PTCA. Admit 03/01/2013 with altered mental status and fever. Recent admission [facility] with hemorrhagic stroke and discharged 02/27/2013 after a three-day hospital stay. Presented with increased headache and nausea as well as hallucinations and altered mental status. Cranial CT scan showed right temporal occipital parenchymal hematoma measuring up to 7 cm in light of trace intraventricular extension without hydrocephalus. Patient was placed on vancomycin /Zosyn for fever of 100.3 white blood cell count 15,000. An MRI was completed 03/05/2013 during workup of fever rule out abscess that  showed a large subacute hematoma right temporoparietal lobe as well small areas of acute infarct in the right occipital parietal lobe and left temporal parietal lobe. Neurosurgery Dr. Danielle Dess consulted advise conservative care in reference of right parietal ICH. TEE completed showing no thrombus without PFO no vegetation. Infectious disease consult and for fever of unknown origin with workup ongoing including CT of the chest 03/08/2013 showed bilateral lobar and segmental pulmonary emboli and bilateral lower extremity Doppler studies consistent with deep vein thrombosis involving the right common femoral vein and right peroneal vein as well as deep vein thrombosis left peroneal vein. Underwent removable Greenfield filter 03/08/2012 per interventional radiology. Depakote was added for headache as well as seizure prevention. Bouts of urinary retention Foley catheter tube inserted x2. Plan is to try a voiding trial as mobility improves. Patient is on a regular consistency diet. Patient transferred to CIR on 03/15/2013 .    Patient currently requires total with lower body basic self-care skills secondary to muscle weakness, decreased cardiorespiratoy endurance, abnormal tone, decreased visual motor skills and field cut, left side neglect, decreased initiation, decreased attention, decreased awareness, decreased safety awareness, decreased memory and delayed processing, central origin and decreased sitting balance, decreased standing balance and decreased postural control.  Prior to hospitalization, patient could complete ADLs with independent .  Patient will benefit from skilled intervention to increase independence with basic self-care skills prior to discharge home with care partner.  Anticipate patient will require 24 hour supervision and follow up home health.  OT - End of Session Activity Tolerance: Tolerates 10 - 20 min activity with multiple rests Endurance Deficit: Yes Endurance Deficit Description:  becomes fatigued easily with simple movements OT Assessment Rehab Potential: Good OT Patient  demonstrates impairments in the following area(s): Balance;Cognition;Endurance;Motor;Perception;Safety;Vision OT Basic ADL's Functional Problem(s): Bathing;Dressing;Toileting OT Transfers Functional Problem(s): Toilet;Tub/Shower OT Additional Impairment(s): None OT Plan OT Intensity: Minimum of 1-2 x/day, 45 to 90 minutes OT Frequency: 5 out of 7 days OT Duration/Estimated Length of Stay: 12- 14 days OT Treatment/Interventions: Balance/vestibular training;Cognitive remediation/compensation;Discharge planning;DME/adaptive equipment instruction;Functional mobility training;Neuromuscular re-education;Patient/family education;Self Care/advanced ADL retraining;Therapeutic Activities;Therapeutic Exercise;UE/LE Strength taining/ROM;UE/LE Coordination activities;Visual/perceptual remediation/compensation OT Self Feeding Anticipated Outcome(s): set up OT Basic Self-Care Anticipated Outcome(s): supervision OT Toileting Anticipated Outcome(s): supervision OT Bathroom Transfers Anticipated Outcome(s): supervision OT Recommendation Patient destination: Home Follow Up Recommendations: Home health OT Equipment Recommended: 3 in 1 bedside comode   Skilled Therapeutic Intervention Pt seen for the initial evaluation and ADL retraining of B/D from bed level with a focus on attention, left visual field awareness, and activity tolerance. Pt seen earlier by PT in which he had very low blood pressure and became unresponsive for a few seconds.  ADL care was performed from bed level to be very conservative as pt was still not feeling well. He needed extra time and cues due to delayed processing. He was having difficulty keeping his eyes open throughout the session.  He actually did very well with his UB self care and adjusting himself up in bed several times.  He was able to bridge his hips and roll L/R in bed without assist.   At end of session pt resting in bed with alarm on, call light in reach, and wife in the room.  OT Evaluation Precautions/Restrictions  Precautions Precautions: Fall Precaution Comments: low blood pressure, becomes dizzy Restrictions Weight Bearing Restrictions: No   Vital Signs Therapy Vitals Pulse Rate: 64 BP: 103/54 mmHg Pain Pain Assessment Pain Assessment: No/denies pain Home Living/Prior Functioning Home Living Available Help at Discharge: Family Type of Home: House Home Access: Stairs to enter Secretary/administrator of Steps: 4 Entrance Stairs-Rails: Right;Left Home Layout: One level Additional Comments: they own a tub bench, BSC  Lives With: Spouse Prior Function Level of Independence: Independent with homemaking with ambulation;Independent with gait  Able to Take Stairs?: Yes Vocation: Part time employment Vocation Requirements: Surveyor, minerals  ADL  Refer to [place] Vision/Perception  Vision - History Baseline Vision: Other (comment) (L visual field cut from CVA 03-01-13) Visual History: Other (comment) Patient Visual Report:  (pt unable to state at this time) Vision - Assessment Eye Alignment:  (appears functional, difficult to assess as pt kept closing his eyes) Vision Assessment: Vision tested Ocular Range of Motion: Restricted on the left Alignment/Gaze Preference: Gaze right Tracking/Visual Pursuits: Left eye does not track laterally;Right eye does not track laterally (he has restricted ocular movement to left) Visual Fields: Left visual field deficit Depth Perception: pt over reaches for objects Perception Perception: Impaired Inattention/Neglect: Does not attend to left visual field Praxis Praxis:  (to be evaluated further, appears to be functional)  Cognition Overall Cognitive Status: Impaired/Different from baseline Orientation Level: Oriented to person;Oriented to situation;Disoriented to place;Disoriented to time Behaviors:  Perseveration Safety/Judgment: Impaired Comments: very slow processing Sensation Sensation Light Touch: Appears Intact Stereognosis: Not tested Hot/Cold: Not tested Proprioception: Not tested Coordination Gross Motor Movements are Fluid and Coordinated: No Fine Motor Movements are Fluid and Coordinated: No Finger Nose Finger Test: very slow movement in BUE Heel Shin Test: not formally tested, but pt able to cross LEs to don socks with assistance Motor  Motor Motor - Skilled Clinical Observations: difficutly relaxing bil LEs/ ? mild hypertonus bil Ues Mobility  Bed Mobility  Bed Mobility: Rolling Left;Left Sidelying to Sit;Supine to Sit;Sit to Supine Rolling Left: 4: Min assist Rolling Left Details: Verbal cues for technique;Tactile cues for weight shifting Left Sidelying to Sit: 3: Mod assist Left Sidelying to Sit Details: Manual facilitation for weight shifting;Manual facilitation for placement;Tactile cues for placement;Tactile cues for weight shifting;Verbal cues for technique Supine to Sit: 3: Mod assist Sitting - Scoot to Edge of Bed: 4: Min assist Sit to Supine: HOB flat;2: Max assist Transfers Sit to Stand: 4: Min assist Stand to Sit: 4: Min assist  Trunk/Postural Assessment  Cervical Assessment Cervical Assessment: Within Functional Limits Thoracic Assessment Thoracic Assessment: Within Functional Limits Lumbar Assessment Lumbar Assessment: Exceptions to [facility] (hx spinal stenosis; s/p surgery per wife; ? painful during sit> supine) Postural Control Postural Control: Deficits on evaluation Protective Responses: not formally tested, but pt slumping forward when fatigued with poor protective response, plus eyes closed majority of eval  Balance Balance Balance Assessed: Yes Static Sitting Balance Static Sitting - Balance Support: Feet supported;No upper extremity supported Static Sitting - Level of Assistance: 5: Stand by assistance Static Sitting - Comment/# of  Minutes: 5 minutes Dynamic Sitting Balance Dynamic Sitting - Level of Assistance: 5: Stand by assistance (while donning socks) Static Standing Balance Static Standing - Balance Support: Bilateral upper extremity supported (gait) Static Standing - Level of Assistance: 3: Mod assist Dynamic Standing Balance Dynamic Standing - Level of Assistance: 3: Mod assist (during gait) Extremity/Trunk Assessment RUE Assessment RUE Assessment: Within Functional Limits LUE Assessment LUE Assessment: Within Functional Limits  FIM:  FIM - Grooming Grooming Steps: Wash, rinse, dry face;Oral care, brush teeth, clean dentures;Brush, comb hair;Shave or apply make-up;Wash, rinse, dry hands (Neurosurgeon) Grooming: 5: Set-up assist to obtain items FIM - Bathing Bathing Steps Patient Completed: Chest;Right Arm;Left Arm;Abdomen;Front perineal area Bathing: 3: Mod-Patient completes 5-7 70f 10 parts or 50-74% FIM - Upper Body Dressing/Undressing Upper body dressing/undressing steps patient completed: Thread/unthread right sleeve of pullover shirt/dresss;Thread/unthread left sleeve of pullover shirt/dress;Put head through opening of pull over shirt/dress;Pull shirt over trunk Upper body dressing/undressing: 5: Set-up assist to: Obtain clothing/put away FIM - Lower Body Dressing/Undressing Lower body dressing/undressing: 1: Total-Patient completed less than 25% of tasks FIM - Toileting Toileting: 0: Activity did not occur FIM - Banker Devices: HOB elevated Bed/Chair Transfer: 3: Supine > Sit: Mod A (lifting assist/Pt. 50-74%/lift 2 legs;2: Sit > Supine: Max A (lifting assist/Pt. 25-49%);4: Bed > Chair or W/C: Min A (steadying Pt. > 75%);4: Chair or W/C > Bed: Min A (steadying Pt. > 75%) FIM - Archivist Transfers: 0-Activity did not occur FIM - IT consultant Transfers: 0-Activity did not occur or was simulated   Refer to Care Plan for  Long Term Goals  Recommendations for other services: None  Discharge Criteria: Patient will be discharged from OT if patient refuses treatment 3 consecutive times without medical reason, if treatment goals not met, if there is a change in medical status, if patient makes no progress towards goals or if patient is discharged from hospital.  The above assessment, treatment plan, treatment alternatives and goals were discussed and mutually agreed upon: by patient and by family  Aralyn Nowak 03/16/2013, 12:06 PM

## 2013-03-16 NOTE — Progress Notes (Signed)
INITIAL NUTRITION ASSESSMENT  DOCUMENTATION CODES Per approved criteria  -Not Applicable   INTERVENTION: Continue Ensure Complete to PO BID. Downgrade diet to Dysphagia 3 to help with oral intake (per wife's request.) RD to continue to follow nutrition care plan.  NUTRITION DIAGNOSIS: Inadequate oral intake related to poor appetite as evidenced by limited PO intake.   Goal: Intake to meet >90% of estimated nutrition needs.  Monitor:  weight trends, lab trends, I/O's, PO intake, supplement tolerance  Reason for Assessment: Malnutrition Screening Tool  77 y.o. male  Admitting Dx: right temporal occipital parenchymal hematoma  ASSESSMENT: PMHx significant for HTN and CAD. Admitted 11/18 with AMS and fever, recent CVA. Cranial CT scan showed right temporal occipital parenchymal hematoma. Admitted to inpatient rehab for deconditioning from hospitalization on 12/2. Pt was followed by RD staff during acute hospitalization.  Currently on a Heart Healthy diet. Pt was not eating well during acute hospitalization 2/2 food choices. Was able to drink oral nutrition supplements and continues on Ensure Complete PO BID at this time. Wife states that patient is drinking 2-3 Ensures daily. Pt consumed approximately 35% of lunch. Noted that wife cut up hamburger and tomato for patient, she states that it is easier for him to eat that way. Agreeable to downgrade of textures.  Pt states that he is not nauseous, however pt appears nauseated when discussing foods with this RD.  Sodium is low at 131 and trending down.  Height: Ht Readings from Last 1 Encounters:  03/15/13 5\' 8"  (1.727 m)    Weight: Wt Readings from Last 1 Encounters:  03/16/13 187 lb 9.8 oz (85.1 kg)    Ideal Body Weight: 154 lb  % Ideal Body Weight: 121%  Wt Readings from Last 10 Encounters:  03/16/13 187 lb 9.8 oz (85.1 kg)  03/01/13 180 lb (81.647 kg)  03/01/13 180 lb (81.647 kg)    Usual Body Weight: 185 lb  %  Usual Body Weight: 101%  BMI:  Body mass index is 28.53 kg/(m^2). Overweight  Estimated Nutritional Needs: Kcal: 1700 - 2000 Protein: 80 - 95 g Fluid: 1.7 - 2 liters  Skin: stage II sacrum  Diet Order: Cardiac  EDUCATION NEEDS: -No education needs identified at this time   Intake/Output Summary (Last 24 hours) at 03/16/13 1220 Last data filed at 03/16/13 0958  Gross per 24 hour  Intake      0 ml  Output   2825 ml  Net  -2825 ml    Last BM: 12/2  Labs:   Recent Labs Lab 03/12/13 0605 03/16/13 0545  NA 137 131*  K 3.8 4.4  CL 105 96  CO2 23 24  BUN 8 13  CREATININE 0.64 0.76  CALCIUM 8.3* 9.3  GLUCOSE 88 97    CBG (last 3)   Recent Labs  03/15/13 2044  GLUCAP 153*    Scheduled Meds: . amLODipine  5 mg Oral Daily  . atorvastatin  20 mg Oral QHS  . divalproex  750 mg Oral Daily  . feeding supplement (ENSURE COMPLETE)  237 mL Oral BID BM  . metoprolol tartrate  50 mg Oral BID  . pantoprazole  40 mg Oral Daily  . tamsulosin  0.4 mg Oral QPC breakfast    Continuous Infusions:    Past Medical History  Diagnosis Date  . Stroke   . Coronary artery disease     3 stents    Past Surgical History  Procedure Laterality Date  . Tee without cardioversion N/A  03/07/2013    Procedure: TRANSESOPHAGEAL ECHOCARDIOGRAM (TEE);  Surgeon: Pricilla Riffle, MD;  Location: [facility] ENDOSCOPY;  Service: Cardiovascular;  Laterality: N/A;    Jarold Motto MS, RD, LDN Pager: [phone] After-hours pager: [phone]

## 2013-03-16 NOTE — Progress Notes (Signed)
Patient information reviewed and entered into eRehab system by Zareah Hunzeker, RN, CRRN, PPS Coordinator.  Information including medical coding and functional independence measure will be reviewed and updated through discharge.     Per nursing patient was given "Data Collection Information Summary for Patients in Inpatient Rehabilitation Facilities with attached "Privacy Act Statement-Health Care Records" upon admission.  

## 2013-03-16 NOTE — Progress Notes (Signed)
Occupational Therapy Session Note  Patient Details  Name: Walter Larsen MRN: [id number] Date of Birth: April 06, 1936  Today's Date: 03/16/2013 Time: 1305-1330 Time Calculation (min): 25 min  Short Term Goals: Week 1:  OT Short Term Goal 1 (Week 1): Pt will be able to transfer to toilet with steady assist. OT Short Term Goal 2 (Week 1): Pt will be able to don pants with min assist. OT Short Term Goal 3 (Week 1): Pt will be able to stand with steadying assist to wash buttocks. OT Short Term Goal 4 (Week 1): Pt will be able to turn head to left with mod cues during self care tasks.  Skilled Therapeutic Interventions/Progress Updates:  Therapy session focused on sit<>stand, standing balance, attention, and functional transfers. Pt received sitting in w/c with wife present. Pt required increased time for processing and was perseverating on removing shirt throughout session. Completed stand pivot transfer w/c<>toilet with mod assist and verbal cues. Pt easily fatigued after transfers. Practiced sit<>stand x5 with pt demonstrating posterior lean each trial. Required min assist for all sit<>stand. Required max cues for pt to open eyes as he kept eyes open approx 10% of therapy session. At end of session pt left sitting in w/c with all needs in reach and QRB donned.    Therapy Documentation Precautions:  Precautions Precautions: Fall Precaution Comments: low blood pressure, becomes dizzy Restrictions Weight Bearing Restrictions: No General:   Vital Signs:   Pain: No report of pain during therapy session.   See FIM for current functional status  Therapy/Group: Individual Therapy  Daneil Dan 03/16/2013, 1:46 PM

## 2013-03-16 NOTE — Progress Notes (Signed)
77 y.o. right-handed male with history of hypertension, CAD with PTCA. Admit 03/01/2013 with altered mental status and fever. Recent admission to [facility] with hemorrhagic stroke and discharged 02/27/2013 after a three-day hospital stay. Presented with increased headache and nausea as well as hallucinations and altered mental status. Cranial CT scan showed right temporal occipital parenchymal hematoma measuring up to 7 cm in light of trace intraventricular extension without hydrocephalus. Patient was placed on vancomycin /Zosyn for fever of 100.3 white blood cell count 15,000. An MRI was completed 03/05/2013 during workup of fever rule out abscess that showed a large subacute hematoma right temporoparietal lobe as well small areas of acute infarct in the right occipital parietal lobe and left temporal parietal lobe. Neurosurgery Dr. Danielle Dess consulted advise conservative care in reference of right parietal ICH. TEE completed showing no thrombus without PFO no vegetation. Infectious disease consult and for fever of unknown origin with workup ongoing including CT of the chest 03/08/2013 showed bilateral lobar and segmental pulmonary emboli and bilateral lower extremity Doppler studies consistent with deep vein thrombosis involving the right common femoral vein and right peroneal vein as well as deep vein thrombosis left peroneal vein. Underwent removable Greenfield filter 03/08/2012 per interventional radiology. Depakote was added for headache as well as seizure prevention. Bouts of urinary retention Foley catheter tube inserted x2. Plan is to try a voiding trial as mobility improves. Patient is on a regular consistency diet.  Subjective/Complaints:   Objective: Vital Signs: Blood pressure 139/74, pulse 89, temperature 99.2 F (37.3 C), temperature source Oral, resp. rate 18, height 5\' 8"  (1.727 m), weight 85.1 kg (187 lb 9.8 oz), SpO2 96.00%. No results found. Results for orders placed during the  hospital encounter of 03/15/13 (from the past 72 hour(s))  GLUCOSE, CAPILLARY     Status: Abnormal   Collection Time    03/15/13  8:44 PM      Result Value Range   Glucose-Capillary 153 (*) 70 - 99 mg/dL  CBC WITH DIFFERENTIAL     Status: Abnormal   Collection Time    03/16/13  5:45 AM      Result Value Range   WBC 15.0 (*) 4.0 - 10.5 K/uL   RBC 3.79 (*) 4.22 - 5.81 MIL/uL   Hemoglobin 11.8 (*) 13.0 - 17.0 g/dL   HCT 16.1 (*) 09.6 - 04.5 %   MCV 93.1  78.0 - 100.0 fL   MCH 31.1  26.0 - 34.0 pg   MCHC 33.4  30.0 - 36.0 g/dL   RDW 40.9  81.1 - 91.4 %   Platelets 230  150 - 400 K/uL   Neutrophils Relative % 72  43 - 77 %   Neutro Abs 10.8 (*) 1.7 - 7.7 K/uL   Lymphocytes Relative 13  12 - 46 %   Lymphs Abs 2.0  0.7 - 4.0 K/uL   Monocytes Relative 13 (*) 3 - 12 %   Monocytes Absolute 1.9 (*) 0.1 - 1.0 K/uL   Eosinophils Relative 2  0 - 5 %   Eosinophils Absolute 0.2  0.0 - 0.7 K/uL   Basophils Relative 0  0 - 1 %   Basophils Absolute 0.0  0.0 - 0.1 K/uL  COMPREHENSIVE METABOLIC PANEL     Status: Abnormal   Collection Time    03/16/13  5:45 AM      Result Value Range   Sodium 131 (*) 135 - 145 mEq/L   Potassium 4.4  3.5 - 5.1 mEq/L  Chloride 96  96 - 112 mEq/L   CO2 24  19 - 32 mEq/L   Glucose, Bld 97  70 - 99 mg/dL   BUN 13  6 - 23 mg/dL   Creatinine, Ser 7.82  0.50 - 1.35 mg/dL   Calcium 9.3  8.4 - 95.6 mg/dL   Total Protein 6.8  6.0 - 8.3 g/dL   Albumin 2.7 (*) 3.5 - 5.2 g/dL   AST 29  0 - 37 U/L   ALT 26  0 - 53 U/L   Alkaline Phosphatase 191 (*) 39 - 117 U/L   Total Bilirubin 0.9  0.3 - 1.2 mg/dL   GFR calc non Af Amer 88 (*) >90 mL/min   GFR calc Af Amer >90  >90 mL/min   Comment: (NOTE)     The eGFR has been calculated using the CKD EPI equation.     This calculation has not been validated in all clinical situations.     eGFR's persistently <90 mL/min signify possible Chronic Kidney     Disease.     HEENT: normal and oral mucosa moist Cardio: RRR and no  murmur Resp: CTA B/L and unlabored GI: BS positive and non tender Extremity:  Pulses positive and No Edema Skin:   Intact Neuro: Alert/Oriented, Cranial Nerve II-XII normal, Abnormal Sensory Difficulty cooperating with sensory exam. Poor attention to task, Normal Motor, Abnormal FMC Ataxic/ dec FMC and Other Able to spell world forward but not backwards Musc/Skel:  Normal General no acute distress   Assessment/Plan: 1. Functional deficits secondary to R temporal/occipital IPH as well as acute infarcts in R occipital/parietal and L temporal/ prieateal which require 3+ hours per day of interdisciplinary therapy in a comprehensive inpatient rehab setting. Physiatrist is providing close team supervision and 24 hour management of active medical problems listed below. Physiatrist and rehab team continue to assess barriers to discharge/monitor patient progress toward functional and medical goals. FIM:                   Comprehension Comprehension Mode: Auditory Comprehension: 4-Understands basic 75 - 89% of the time/requires cueing 10 - 24% of the time  Expression Expression Mode: Verbal Expression: 4-Expresses basic 75 - 89% of the time/requires cueing 10 - 24% of the time. Needs helper to occlude trach/needs to repeat words.  Social Interaction Social Interaction: 5-Interacts appropriately 90% of the time - Needs monitoring or encouragement for participation or interaction.  Problem Solving Problem Solving: 4-Solves basic 75 - 89% of the time/requires cueing 10 - 24% of the time  Memory Memory: 4-Recognizes or recalls 75 - 89% of the time/requires cueing 10 - 24% of the time  Medical Problem List and Plan:  1. Right temporal parietal intracranial hemorrhage as well as right occipital infarct  2. DVT Prophylaxis/Anticoagulation: Pulmonary emboli/DVT right common femoral vein and peroneal vein, left peroneal vein. Status post Greenfield filter 03/08/2013  3. Pain  Management/headaches. Depakote 750 mg daily  4. Neuropsych: This patient is capable of making decisions on his own behalf.  5. ID/fever. All antibiotics discontinued 03/08/2013 with workup unremarkable except for signs of DVT /PE  6. Hypertension. Norvasc 5 mg daily, Lopressor 50 mg twice a day. Monitor with increased mobility  7. Hyperlipidemia. Lipitor  8. BPH. Flomax. Check PVRs x3  9. CAD/PTCA. Aspirin prior to admission discontinued secondary to hemorrhage. No chest pain or shortness of breath  10. Urinary retention. Foley catheter tube reinserted x2. Will attempt voiding trial as mobility improves  LOS (Days) 1 A FACE TO FACE EVALUATION WAS PERFORMED  KIRSTEINS,ANDREW E 03/16/2013, 8:50 AM

## 2013-03-17 ENCOUNTER — Inpatient Hospital Stay (HOSPITAL_COMMUNITY): Payer: Medicare Other

## 2013-03-17 ENCOUNTER — Inpatient Hospital Stay (HOSPITAL_COMMUNITY): Payer: Medicare Other | Admitting: Physical Therapy

## 2013-03-17 DIAGNOSIS — I69993 Ataxia following unspecified cerebrovascular disease: Secondary | ICD-10-CM

## 2013-03-17 DIAGNOSIS — I619 Nontraumatic intracerebral hemorrhage, unspecified: Secondary | ICD-10-CM

## 2013-03-17 NOTE — IPOC Note (Signed)
Overall Plan of Care [facility]) Patient Details Name: Walter Larsen MRN: [id number] DOB: 1935/04/27  Admitting Diagnosis: CVA  Hospital Problems: Active Problems:   ICH (intracerebral hemorrhage)     Functional Problem List: Nursing Bladder;Bowel;Endurance;Medication Management;Safety;Skin Integrity  PT Balance;Behavior;Edema;Endurance;Motor;Pain;Perception;Safety;Sensory  OT Balance;Cognition;Endurance;Motor;Perception;Safety;Vision  SLP Cognition;Linguistic  TR         Basic ADL's: OT Bathing;Dressing;Toileting     Advanced  ADL's: OT       Transfers: PT Bed Mobility;Bed to Chair;Car;Furniture  OT Toilet;Tub/Shower     Locomotion: PT Ambulation;Wheelchair Mobility;Stairs     Additional Impairments: OT None  SLP Communication;Social Cognition comprehension;expression Problem Solving;Memory;Attention;Awareness  TR      Anticipated Outcomes Item Anticipated Outcome  Self Feeding set up  Swallowing      Basic self-care  supervision  Toileting  supervision   Bathroom Transfers supervision  Bowel/Bladder  cont of bowel and bladder with min assist  Transfers  supervision  Locomotion  supervision w/c propulsion x 150', and gait x 50', up/down 4 steps 2 rails, min assist  Communication  Mod  Cognition  Mod  Pain  pain level less than 3/10  Safety/Judgment  patient will be safe and no falls with min assist while in rehab   Therapy Plan: PT Intensity: Minimum of 1-2 x/day ,45 to 90 minutes PT Frequency: 5 out of 7 days PT Duration Estimated Length of Stay: 14-18 days OT Intensity: Minimum of 1-2 x/day, 45 to 90 minutes OT Frequency: 5 out of 7 days OT Duration/Estimated Length of Stay: 12- 14 days SLP Intensity: Minumum of 1-2 x/day, 30 to 90 minutes SLP Frequency: 5 out of 7 days SLP Duration/Estimated Length of Stay: 12-15 days       Team Interventions: Nursing Interventions Patient/Family Education;Bladder Management;Bowel Management;Disease  Management/Prevention;Pain Management;Medication Management;Skin Care/Wound Management;Psychosocial Support  PT interventions Ambulation/gait training;Balance/vestibular training;Cognitive remediation/compensation;Discharge planning;Neuromuscular re-education;Functional mobility training;DME/adaptive equipment instruction;Pain management;Patient/family education;Splinting/orthotics;UE/LE Coordination activities;UE/LE Strength taining/ROM;Therapeutic Exercise;Therapeutic Activities;Stair training;Visual/perceptual remediation/compensation;Wheelchair propulsion/positioning  OT Interventions Balance/vestibular training;Cognitive remediation/compensation;Discharge planning;DME/adaptive equipment instruction;Functional mobility training;Neuromuscular re-education;Patient/family education;Self Care/advanced ADL retraining;Therapeutic Activities;Therapeutic Exercise;UE/LE Strength taining/ROM;UE/LE Coordination activities;Visual/perceptual remediation/compensation  SLP Interventions Cognitive remediation/compensation;Cueing hierarchy;Environmental controls;Functional tasks;Internal/external aids;Patient/family education;Speech/Language facilitation  TR Interventions    SW/CM Interventions      Team Discharge Planning: Destination: PT-Home ,OT- Home , SLP-  Projected Follow-up: PT-Home health PT, OT-  Home health OT, SLP-  Projected Equipment Needs: PT-Rolling walker with 5" wheels, OT- 3 in 1 bedside comode, SLP-  Equipment Details: PT- , OT-  Patient/family involved in discharge planning: PT- Family member/caregiver,  OT-Family member/caregiver, SLP-Patient;Family member/caregiver  MD ELOS: 12-15 days Medical Rehab Prognosis:  Good Assessment: 77 y.o. right-handed male with history of hypertension, CAD with PTCA. Admit 03/01/2013 with altered mental status and fever. Recent admission to [facility] with hemorrhagic stroke and discharged 02/27/2013 after a three-day hospital stay. Presented with  increased headache and nausea as well as hallucinations and altered mental status. Cranial CT scan showed right temporal occipital parenchymal hematoma measuring up to 7 cm in light of trace intraventricular extension without hydrocephalus. Patient was placed on vancomycin /Zosyn for fever of 100.3 white blood cell count 15,000. An MRI was completed 03/05/2013 during workup of fever rule out abscess that showed a large subacute hematoma right temporoparietal lobe as well small areas of acute infarct in the right occipital parietal lobe and left temporal parietal lobe. Neurosurgery Dr. Danielle Dess consulted advise conservative care in reference of right parietal ICH. TEE completed showing no thrombus without PFO no vegetation. Infectious disease  consult and for fever of unknown origin with workup ongoing including CT of the chest 03/08/2013 showed bilateral lobar and segmental pulmonary emboli and bilateral lower extremity Doppler studies consistent with deep vein thrombosis involving the right common femoral vein and right peroneal vein as well as deep vein thrombosis left peroneal vein. Underwent removable Greenfield filter 03/08/2012 per interventional radiology. Depakote was added for headache as well as seizure prevention   Now requiring 24/7 Rehab RN,MD, as well as CIR level PT, OT and SLP.  Treatment team will focus on ADLs and mobility with goals set at supervision  See Team Conference Notes for weekly updates to the plan of care

## 2013-03-17 NOTE — Progress Notes (Signed)
Social Work Assessment and Plan Social Work Assessment and Plan  Patient Details  Name: Walter Larsen MRN: [id number] Date of Birth: 1935-05-21  Today's Date: 03/17/2013  Problem List:  Patient Active Problem List   Diagnosis Date Noted  . ICH (intracerebral hemorrhage) 03/15/2013  . Dry eyes 03/08/2013  . Hyponatremia 03/08/2013  . Headache 03/08/2013  . Acute encephalopathy 03/08/2013  . Pulmonary emboli 03/08/2013  . Other and unspecified hyperlipidemia 03/02/2013  . Bradycardia 03/02/2013  . Hypokalemia 03/02/2013  . Hemorrhagic stroke 03/01/2013  . Essential hypertension, benign 03/01/2013  . Fever, unspecified 03/01/2013   Past Medical History:  Past Medical History  Diagnosis Date  . Stroke   . Coronary artery disease     3 stents   Past Surgical History:  Past Surgical History  Procedure Laterality Date  . Tee without cardioversion N/A 03/07/2013    Procedure: TRANSESOPHAGEAL ECHOCARDIOGRAM (TEE);  Surgeon: Pricilla Riffle, MD;  Location: [facility] ENDOSCOPY;  Service: Cardiovascular;  Laterality: N/A;   Social History:  reports that he has never smoked. He does not have any smokeless tobacco history on file. He reports that he does not drink alcohol or use illicit drugs.  Family / Support Systems Marital Status: Married Patient Roles: Spouse;Parent;Other (Comment) (Employee) Spouse/Significant Other: Bonita Quin [phone]  [phone] Children: David-son  [phone] Other Supports: Another son Anticipated Caregiver: Wife Ability/Limitations of Caregiver: Wife has some health issues and can not provide much physical care Caregiver Availability: 24/7 Family Dynamics: Close knit family who are willing to do for one another.  Pt has been so independent he has always doen the care and helped others this is a first for him.  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  Wife is here and involved in pt's care   Abuse/Neglect Physical Abuse: Denies Verbal Abuse: Denies Sexual Abuse: Denies Exploitation of patient/patient's resources: Denies Self-Neglect: Denies  Emotional Status Pt's affect, behavior adn adjustment status: Pt is tired from therapies, but does answer when asked a questions.  He wants to be as independent as possible he is not used to relying upon others and wants to do for himself.  His wife confirms how active her husband is.  She states: " He is not used to sitting still." Recent Psychosocial Issues: recent health issues since beginning of Nov Pyschiatric History: No history deferred depression screen due to pt tired form therapies and was closing his eyes and had to be aroused by the end of session.  Will monitor his coping while here ,may benefit from Neuro-psych due to how active he was. Substance Abuse History: No issues  Patient / Family Perceptions, Expectations & Goals Pt/Family understanding of illness & functional limitations: Pt and wife can explain his stroke and condition.  Wife has been here and observed int ehrapies otday and is awar eof his deficits.  Her concern is he moves quickly and that is the reason one of them is here always.  They are afraid he will fall, since he moves so fast. 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, etc Anticipated changes in roles/activities/participation: Resume Pt/family expectations/goals: Pt states: " This is somethng, I will get through it."  Wife states; " I hope he does well here and I can handle  him at home."  [employer]: None Premorbid Home Care/DME Agencies: None Transportation available at  discharge: Wife Resource referrals recommended: Support group (specify) ([facility] SUpport group)  Discharge Planning Living Arrangements: Spouse/significant other Support Systems: Spouse/significant other;Children;Friends/neighbors;Church/faith community Type of Residence: Private residence Insurance Resources: Media planner (specify) Investment banker, operational) Financial Resources: Tree surgeon;Other (Comment) (Odd jobs) Surveyor, quantity Screen Referred: No Living Expenses: Lives with family Money Management: Spouse;Patient Does the patient have any problems obtaining your medications?: No Home Management: Wife, pt does outside work Associate Professor Plans: Return home with wife providing care to pt.  She plans to be here daily and observe and particiapte in therapies when she can or when team is ready.  Supportive family who are very involved. Social Work Anticipated Follow Up Needs: HH/OP;Support Group  Clinical Impression Pleasant very tired gentleman who is not used to being in this role, he has been very independent throughout his life.  Supportive wife and son who are very involved. Will await team's evaluations to formulate a discharge plan.  Lucy Chris 03/17/2013, 8:57 AM

## 2013-03-17 NOTE — Progress Notes (Signed)
Speech Language Pathology Daily Session Note  Patient Details  Name: Walter Larsen MRN: [id number] Date of Birth: [birth year]  Today's Date: 03/17/2013 Time: 1000-1030 Time Calculation (min): 30 min  Short Term Goals: Week 1: SLP Short Term Goal 1 (Week 1): Pt will sustain attention to basic, structured task for 60 seconds with Max cues SLP Short Term Goal 2 (Week 1): Pt will follow one-step commands with 80% accuracy with Max cues SLP Short Term Goal 3 (Week 1): Pt will answer basic yes/no questions with 80% accuracy with Max cues SLP Short Term Goal 4 (Week 1): Pt will utilize external aids to demonstrate orientation x4 with Max cues SLP Short Term Goal 5 (Week 1): Pt will name common objects with 80% accuracy with Max cues  Skilled Therapeutic Interventions: Treatment focused on cognitive-linguistic goals. SLP facilitated session with Max faded to Min cues for arousal, with pt demonstrating increased LOA the last ~10 minutes of the session. Pt demonstrated sustained attention for <30 seconds with Max cues. He required Max cues for following basic commands and orientation. When maximally alert, he identified colors with Mod cues. Continue plan of care.   FIM:  Comprehension Comprehension Mode: Auditory Comprehension: 1-Understands basic less than 25% of the time/requires cueing 75% of the time Expression Expression: 4-Expresses basic 75 - 89% of the time/requires cueing 10 - 24% of the time. Needs helper to occlude trach/needs to repeat words. Social Interaction Social Interaction: 2-Interacts appropriately 25 - 49% of time - Needs frequent redirection. Problem Solving Problem Solving: 1-Solves basic less than 25% of the time - needs direction nearly all the time or does not effectively solve problems and may need a restraint for safety Memory Memory: 1-Recognizes or recalls less than 25% of the time/requires cueing greater than 75% of the time  Pain Pain Assessment Pain Assessment:  No/denies pain  Therapy/Group: Individual Therapy   Maxcine Ham, M.A. CCC-SLP [phone]   Maxcine Ham 03/17/2013, 3:52 PM

## 2013-03-17 NOTE — Progress Notes (Signed)
I&O cath pt at 1445 with a moderate amount of blood present in urine. Cath clotted off with thick blood, unable to completely empty urine from bladder. Notified Deatra Ina PA with issue. No new orders at this time. Continue to monitor bladder scans and I&O caths.

## 2013-03-17 NOTE — Plan of Care (Signed)
Problem: RH BLADDER ELIMINATION Goal: RH STG MANAGE BLADDER WITH ASSISTANCE STG Manage Bladder With min Assistance  Outcome: Not Progressing Requires i & o cath

## 2013-03-17 NOTE — Progress Notes (Signed)
77 y.o. right-handed male with history of hypertension, CAD with PTCA. Admit 03/01/2013 with altered mental status and fever. Recent admission to [facility] with hemorrhagic stroke and discharged 02/27/2013 after a three-day hospital stay. Presented with increased headache and nausea as well as hallucinations and altered mental status. Cranial CT scan showed right temporal occipital parenchymal hematoma measuring up to 7 cm in light of trace intraventricular extension without hydrocephalus. Patient was placed on vancomycin /Zosyn for fever of 100.3 white blood cell count 15,000. An MRI was completed 03/05/2013 during workup of fever rule out abscess that showed a large subacute hematoma right temporoparietal lobe as well small areas of acute infarct in the right occipital parietal lobe and left temporal parietal lobe. Neurosurgery Dr. Danielle Dess consulted advise conservative care in reference of right parietal ICH. TEE completed showing no thrombus without PFO no vegetation. Infectious disease consult and for fever of unknown origin with workup ongoing including CT of the chest 03/08/2013 showed bilateral lobar and segmental pulmonary emboli and bilateral lower extremity Doppler studies consistent with deep vein thrombosis involving the right common femoral vein and right peroneal vein as well as deep vein thrombosis left peroneal vein. Underwent removable Greenfield filter 03/08/2012 per interventional radiology. Depakote was added for headache as well as seizure prevention. Bouts of urinary retention Foley catheter tube inserted x2. Plan is to try a voiding trial as mobility improves. Patient is on a regular consistency diet.  Subjective/Complaints: Trying to put both arms into one sleeve No pain c/os Working with OT  Review of Systems - poor awareness of deficits  Objective: Vital Signs: Blood pressure 123/81, pulse 83, temperature 98.4 F (36.9 C), temperature source Oral, resp. rate 18, height  5\' 8"  (1.727 m), weight 85.1 kg (187 lb 9.8 oz), SpO2 95.00%. No results found. Results for orders placed during the hospital encounter of 03/15/13 (from the past 72 hour(s))  GLUCOSE, CAPILLARY     Status: Abnormal   Collection Time    03/15/13  8:44 PM      Result Value Range   Glucose-Capillary 153 (*) 70 - 99 mg/dL  CBC WITH DIFFERENTIAL     Status: Abnormal   Collection Time    03/16/13  5:45 AM      Result Value Range   WBC 15.0 (*) 4.0 - 10.5 K/uL   RBC 3.79 (*) 4.22 - 5.81 MIL/uL   Hemoglobin 11.8 (*) 13.0 - 17.0 g/dL   HCT 16.1 (*) 09.6 - 04.5 %   MCV 93.1  78.0 - 100.0 fL   MCH 31.1  26.0 - 34.0 pg   MCHC 33.4  30.0 - 36.0 g/dL   RDW 40.9  81.1 - 91.4 %   Platelets 230  150 - 400 K/uL   Neutrophils Relative % 72  43 - 77 %   Neutro Abs 10.8 (*) 1.7 - 7.7 K/uL   Lymphocytes Relative 13  12 - 46 %   Lymphs Abs 2.0  0.7 - 4.0 K/uL   Monocytes Relative 13 (*) 3 - 12 %   Monocytes Absolute 1.9 (*) 0.1 - 1.0 K/uL   Eosinophils Relative 2  0 - 5 %   Eosinophils Absolute 0.2  0.0 - 0.7 K/uL   Basophils Relative 0  0 - 1 %   Basophils Absolute 0.0  0.0 - 0.1 K/uL  COMPREHENSIVE METABOLIC PANEL     Status: Abnormal   Collection Time    03/16/13  5:45 AM      Result  Value Range   Sodium 131 (*) 135 - 145 mEq/L   Potassium 4.4  3.5 - 5.1 mEq/L   Chloride 96  96 - 112 mEq/L   CO2 24  19 - 32 mEq/L   Glucose, Bld 97  70 - 99 mg/dL   BUN 13  6 - 23 mg/dL   Creatinine, Ser 9.60  0.50 - 1.35 mg/dL   Calcium 9.3  8.4 - 45.4 mg/dL   Total Protein 6.8  6.0 - 8.3 g/dL   Albumin 2.7 (*) 3.5 - 5.2 g/dL   AST 29  0 - 37 U/L   ALT 26  0 - 53 U/L   Alkaline Phosphatase 191 (*) 39 - 117 U/L   Total Bilirubin 0.9  0.3 - 1.2 mg/dL   GFR calc non Af Amer 88 (*) >90 mL/min   GFR calc Af Amer >90  >90 mL/min   Comment: (NOTE)     The eGFR has been calculated using the CKD EPI equation.     This calculation has not been validated in all clinical situations.     eGFR's persistently <90  mL/min signify possible Chronic Kidney     Disease.     HEENT: normal and oral mucosa moist Cardio: RRR and no murmur Resp: CTA B/L and unlabored GI: BS positive and non tender Extremity:  Pulses positive and No Edema Skin:   Intact Neuro: Alert/Oriented, Cranial Nerve II-XII normal, Abnormal Sensory Difficulty cooperating with sensory exam. Poor attention to task, Normal Motor, Abnormal FMC Ataxic/ dec FMC and Other Able to spell world forward but not backwards Musc/Skel:  Normal General no acute distress   Assessment/Plan: 1. Functional deficits secondary to R temporal/occipital IPH as well as acute infarcts in R occipital/parietal and L temporal/ prieateal which require 3+ hours per day of interdisciplinary therapy in a comprehensive inpatient rehab setting. Physiatrist is providing close team supervision and 24 hour management of active medical problems listed below. Physiatrist and rehab team continue to assess barriers to discharge/monitor patient progress toward functional and medical goals. FIM: FIM - Bathing Bathing Steps Patient Completed: Chest;Right Arm;Left Arm;Abdomen;Front perineal area Bathing: 3: Mod-Patient completes 5-7 78f 10 parts or 50-74%  FIM - Upper Body Dressing/Undressing Upper body dressing/undressing steps patient completed: Thread/unthread right sleeve of pullover shirt/dresss;Thread/unthread left sleeve of pullover shirt/dress;Put head through opening of pull over shirt/dress;Pull shirt over trunk Upper body dressing/undressing: 5: Set-up assist to: Obtain clothing/put away FIM - Lower Body Dressing/Undressing Lower body dressing/undressing: 1: Total-Patient completed less than 25% of tasks  FIM - Hotel manager Devices: Grab bar or rail for support Toileting: 1: Total-Patient completed zero steps, helper did all 3  FIM - Diplomatic Services operational officer Devices: Grab bars Toilet Transfers: 3-To toilet/BSC: Mod A (lift or  lower assist);3-From toilet/BSC: Mod A (lift or lower assist)  FIM - Press photographer Assistive Devices: HOB elevated Bed/Chair Transfer: 3: Chair or W/C > Bed: Mod A (lift or lower assist)  FIM - Locomotion: Wheelchair Distance: 50' Locomotion: Wheelchair: 2: Travels 50 - 149 ft with minimal assistance (Pt.>75%) FIM - Locomotion: Ambulation Locomotion: Ambulation Assistive Devices: Other (comment) (HHA) Ambulation/Gait Assistance: 3: Mod assist Locomotion: Ambulation: 1: Travels less than 50 ft with moderate assistance (Pt: 50 - 74%)  Comprehension Comprehension Mode: Auditory Comprehension: 2-Understands basic 25 - 49% of the time/requires cueing 51 - 75% of the time  Expression Expression Mode: Verbal Expression: 4-Expresses basic 75 - 89% of the time/requires cueing 10 -  24% of the time. Needs helper to occlude trach/needs to repeat words.  Social Interaction Social Interaction: 2-Interacts appropriately 25 - 49% of time - Needs frequent redirection.  Problem Solving Problem Solving: 1-Solves basic less than 25% of the time - needs direction nearly all the time or does not effectively solve problems and may need a restraint for safety  Memory Memory: 1-Recognizes or recalls less than 25% of the time/requires cueing greater than 75% of the time  Medical Problem List and Plan:  1. Right temporal parietal intracranial hemorrhage as well as right occipital infarct  2. DVT Prophylaxis/Anticoagulation: Pulmonary emboli/DVT right common femoral vein and peroneal vein, left peroneal vein. Status post Greenfield filter 03/08/2013  3. Pain Management/headaches. Depakote 750 mg daily  4. Neuropsych: This patient is capable of making decisions on his own behalf.  5. ID/fever. All antibiotics discontinued 03/08/2013 with workup unremarkable except for signs of DVT /PE  6. Hypertension. Norvasc 5 mg daily, Lopressor 50 mg twice a day. Monitor with increased mobility   7. Hyperlipidemia. Lipitor  8. BPH. Flomax. Check PVRs x3  9. CAD/PTCA. Aspirin prior to admission discontinued secondary to hemorrhage. No chest pain or shortness of breath  10. Urinary retention. Foley catheter tube reinserted x2. Will attempt voiding trial as mobility improves      LOS (Days) 2 A FACE TO FACE EVALUATION WAS PERFORMED  Walter Larsen 03/17/2013, 8:46 AM

## 2013-03-17 NOTE — Progress Notes (Signed)
Physical Therapy Note  Patient Details  Name: Doak Mah MRN: [id number] Date of Birth: Aug 08, 1935 Today's Date: 03/17/2013  Time: 1115-1155 40 minutes  1:1 No c/o pain.  Bed mobility supine <> sit with cues for sequencing and motor planning, increased processing time.  Min A for trunk and LE control with bed mobility.  Pt requires max A for attending to L side.  Gait with RW 2 x 25' with min A. Pt requires cues for posture and attention to task, no LOB during gait training.  Stair negotiation with B handrails with min A, max cues for attending to L handrail, cues for safety with stepping.  Pt requires prolonged rest breaks between each activity.  Seated kinetron for activity tolerance, pt able to pedal 2 x 1 minute with max encouragement and max cues for attention.   Kerilyn Cortner 03/17/2013, 11:56 AM

## 2013-03-17 NOTE — Progress Notes (Signed)
Physical Therapy Session Note  Patient Details  Name: Walter Larsen MRN: [id number] Date of Birth: 1935-07-09  Today's Date: 03/17/2013 Time: 1330-1430  Time Calculation (min): 60 min  Short Term Goals: Week 1:  PT Short Term Goal 1 (Week 1): pt will perform supine>< sit with min assist 5/5 trials PT Short Term Goal 2 (Week 1): pt will perform basic transfer w/c>< bed with min assist 5/5 trial PT Short Term Goal 3 (Week 1): pt will propel w/c x 100' with min assist PT Short Term Goal 4 (Week 1): pt will perform gait x 25' with min assist PT Short Term Goal 5 (Week 1): pt will ascend/descend 4 steps 2 rails with min assist  Skilled Therapeutic Interventions/Progress Updates:   Son Onalee Hua in attendance.  neuromuscular re-education for:   w/c propulsion using bil UEs and bil LEs, x 70' with min assist and cues for problem solving when bumping into obstacles on L; gait with RW focusing on longer step lengths, upright posture, eyes open and forward.  Gait on level tile and carpet, x 20-25' with min/mod assist, RW.  Therapeutic activities sit>< stand and standing x 30 seconds at window and at high table, to stimulate visual attention in all directions.  BP in sitting after gait = 107/67; BP at end of session after standing x 30 seconds, 86/53; complained of dizziness and fatigue.  Discussed with [place] RN who stated pt needs hypertension meds in AM.  Pt transferred w/c> bed to L with min/mod assist; sit> supine with mod assist.  See doc flowsheets for cognition information.    Therapy Documentation Precautions:  Precautions Precautions: Fall Precaution Comments: low blood pressure, becomes dizzy Restrictions Weight Bearing Restrictions: No   Pain: Pain Assessment Pain Assessment: No/denies pain   Locomotion : Wheelchair Mobility Distance: 70    Other Treatments: Treatments Neuromuscular Facilitation: Activity to increase sustained activation;Activity to increase grading;Forced  use;Lower Extremity;Left;Right  See FIM for current functional status  Therapy/Group: Individual Therapy  Marcela Alatorre 03/17/2013, 4:02 PM

## 2013-03-17 NOTE — Plan of Care (Signed)
Problem: RH BLADDER ELIMINATION Goal: RH STG MANAGE BLADDER WITH ASSISTANCE STG Manage Bladder With min Assistance  Outcome: Not Progressing Pt requiring I&O caths. Pt unable to empty bladder

## 2013-03-17 NOTE — Progress Notes (Signed)
Occupational Therapy Session Note  Patient Details  Name: Walter Larsen MRN: [id number] Date of Birth: [birth year]  Today's Date: 03/17/2013 Time: 0730-0830 Time Calculation (min): 60 min  Short Term Goals: Week 1:  OT Short Term Goal 1 (Week 1): Pt will be able to transfer to toilet with steady assist. OT Short Term Goal 2 (Week 1): Pt will be able to don pants with min assist. OT Short Term Goal 3 (Week 1): Pt will be able to stand with steadying assist to wash buttocks. OT Short Term Goal 4 (Week 1): Pt will be able to turn head to left with mod cues during self care tasks.  Skilled Therapeutic Interventions/Progress Updates:    Pt seen for ADL retraining this AM with focus on attention to L, attention to task, and visual deficits. Pt received supine in bed eating breakfast with son present. Pt agreed to sit EOB unsupported to engage in self-feeding task with focus on visual scanning to L and attention to L. Began with placing item in midline and having pt locate then gradually moving to L visual field with pt locating 80% of time with min cues. Pt required max cues to keep eyes open during task and improved this by attending to task with eyes open approx 50% of therapy session. Presented all items from L side with verbal and auditory cues to locate. Required min assist for sit<>stand for clothing management and buttocks hygiene. Pt required increased time for dressing and cues for threading secondary to pt attempting to dress with eyes closed. Allowed pt to thread BUE into same sleeve for problem solving requiring mod cues to correct. Pt required max verbal cues for sustained attention throughout session. At end of session pt left sitting in w/c with QRB donned and all items in reach.   Therapy Documentation Precautions:  Precautions Precautions: Fall Precaution Comments: low blood pressure, becomes dizzy Restrictions Weight Bearing Restrictions: No General:   Vital Signs:   Pain: No  c/o pain during therapy session.   Other Treatments:    See FIM for current functional status  Therapy/Group: Individual Therapy  Koi Yarbro, Vara Guardian 03/17/2013, 10:05 AM

## 2013-03-18 ENCOUNTER — Inpatient Hospital Stay (HOSPITAL_COMMUNITY): Payer: Medicare Other

## 2013-03-18 ENCOUNTER — Inpatient Hospital Stay (HOSPITAL_COMMUNITY): Payer: Medicare Other | Admitting: Physical Therapy

## 2013-03-18 DIAGNOSIS — R209 Unspecified disturbances of skin sensation: Secondary | ICD-10-CM

## 2013-03-18 DIAGNOSIS — I10 Essential (primary) hypertension: Secondary | ICD-10-CM

## 2013-03-18 DIAGNOSIS — I69998 Other sequelae following unspecified cerebrovascular disease: Secondary | ICD-10-CM

## 2013-03-18 DIAGNOSIS — I619 Nontraumatic intracerebral hemorrhage, unspecified: Secondary | ICD-10-CM

## 2013-03-18 DIAGNOSIS — I2510 Atherosclerotic heart disease of native coronary artery without angina pectoris: Secondary | ICD-10-CM

## 2013-03-18 LAB — URINALYSIS, ROUTINE W REFLEX MICROSCOPIC
Bilirubin Urine: NEGATIVE
Glucose, UA: NEGATIVE mg/dL
Nitrite: NEGATIVE
Specific Gravity, Urine: 1.019 (ref 1.005–1.030)
Urobilinogen, UA: 0.2 mg/dL (ref 0.0–1.0)

## 2013-03-18 LAB — URINE MICROSCOPIC-ADD ON

## 2013-03-18 MED ORDER — TRAZODONE HCL 50 MG PO TABS
50.0000 mg | ORAL_TABLET | Freq: Every evening | ORAL | Status: DC | PRN
  Administered 2013-03-18: 50 mg via ORAL
  Filled 2013-03-18: qty 1

## 2013-03-18 NOTE — Progress Notes (Signed)
Physical Therapy Session Note  Patient Details  Name: Walter Larsen MRN: [id number] Date of Birth: 05/04/1935  Today's Date: 03/18/2013 Time: 8469-6295 Time Calculation (min): 28 min  Short Term Goals: Week 1:  PT Short Term Goal 1 (Week 1): pt will perform supine>< sit with min assist 5/5 trials PT Short Term Goal 2 (Week 1): pt will perform basic transfer w/c>< bed with min assist 5/5 trial PT Short Term Goal 3 (Week 1): pt will propel w/c x 100' with min assist PT Short Term Goal 4 (Week 1): pt will perform gait x 25' with min assist PT Short Term Goal 5 (Week 1): pt will ascend/descend 4 steps 2 rails with min assist  Skilled Therapeutic Interventions/Progress Updates:    Session focused on functional mobility and transfers, attention to task and reorientation, maintaining eyes open and alertness to follow commands, w/c propulsion in hallway with mod cues needed for obstacle negotiation and mod A for w/c mobility, and attention to the L during functional tasks. Pt perseverating on "being pregnant" and "having this baby today" throughout session. Pt's wife stated they are having a grandchild due in June, but unclear where this topic initiated from. Returned to bed end of session and wife present.  Therapy Documentation Precautions:  Precautions Precautions: Fall Precaution Comments: low blood pressure, becomes dizzy Restrictions Weight Bearing Restrictions: No Pain: Pain Assessment Pain Assessment: No/denies pain Pain Score: 0-No pain  See FIM for current functional status  Therapy/Group: Individual Therapy  Karolee Stamps [facility] 03/18/2013, 4:33 PM

## 2013-03-18 NOTE — Progress Notes (Signed)
Rechecked temp. 98.6. Patient resting quietly at this time. Family members at bedside. Will continue to monitor.

## 2013-03-18 NOTE — Progress Notes (Signed)
Physical Therapy Session Note  Patient Details  Name: Walter Larsen MRN: [id number] Date of Birth: [birth year]  Today's Date: 03/18/2013 Time: 0900-0940 Time Calculation (min): 40 min  Short Term Goals: Week 1:  PT Short Term Goal 1 (Week 1): pt will perform supine>< sit with min assist 5/5 trials PT Short Term Goal 2 (Week 1): pt will perform basic transfer w/c>< bed with min assist 5/5 trial PT Short Term Goal 3 (Week 1): pt will propel w/c x 100' with min assist PT Short Term Goal 4 (Week 1): pt will perform gait x 25' with min assist PT Short Term Goal 5 (Week 1): pt will ascend/descend 4 steps 2 rails with min assist  Skilled Therapeutic Interventions/Progress Updates:    Pt received supine in bed with spouse present, pt c/o feeling constipated and requested being taken to bathroom.  Consulted with nsg and pt had received meds for bowel. Pt performed roll side to side, L side min A and R side mod A. Stand pivot transfer bed to w/c and w/c to raised toilet seat at toilet with mod A to R side with tactile and vcs for hand placement and sequencing, and proper set up of w/c. Pt performed w/c mobility in and around room to get to sink to perform hand washing. Pt left in w/c with quick release safety belt, needs within reach and wife present.   Therapy Documentation Precautions:  Precautions Precautions: Fall Precaution Comments: low blood pressure, becomes dizzy Restrictions Weight Bearing Restrictions: No     Pain: Pain Assessment Pain Assessment: No/denies pain Pain Score: 0-No pain               See FIM for current functional status  Therapy/Group: Individual Therapy  [name] 03/18/2013, 3:51 PM

## 2013-03-18 NOTE — Progress Notes (Signed)
Occupational Therapy Session Note  Patient Details  Name: Walter Larsen MRN: [id number] Date of Birth: 07-11-1935  Today's Date: 03/18/2013 Time: 1030-1130 Time Calculation (min): 60 min  Short Term Goals: Week 1:  OT Short Term Goal 1 (Week 1): Pt will be able to transfer to toilet with steady assist. OT Short Term Goal 2 (Week 1): Pt will be able to don pants with min assist. OT Short Term Goal 3 (Week 1): Pt will be able to stand with steadying assist to wash buttocks. OT Short Term Goal 4 (Week 1): Pt will be able to turn head to left with mod cues during self care tasks.  Skilled Therapeutic Interventions/Progress Updates:    Pt seen for ADL retraining with focus on following simple commands, attention to L, sustained attention, and functional transfers. Pt received sitting in w/c with wife and cousin present. Pt very fatigued but willing to participate in therapy. Completed bathing at sink for safety. Pt required max cues (tactile and verbal-one step commands with increased processing time) for self-care tasks. Pt scanning to left approx 10% of time to locate items. Min assist for sit<>stand and standing balance during clothing management around waist. Pt with limited sustained attention requiring max cues throughout session. Pt with no c/o dizziness or headaches during activity. Pt's cousin consistently attempting to provide extra assist to pt and providing excessive cues. Discussed allowing pt increased time to process information and using simple commands however no carryover. Pt required max cues to keep eyes open during therapy session and pt able to keep them open for 2-3 sec approx 20-30% of session. At end of session assisted pt back to bed as he was falling asleep in chair. Pt fearful when lying on L side in bed.   Therapy Documentation Precautions:  Precautions Precautions: Fall Precaution Comments: low blood pressure, becomes dizzy Restrictions Weight Bearing Restrictions:  No General:   Vital Signs:   Pain: No report of pain during therapy session.   See FIM for current functional status  Therapy/Group: Individual Therapy  Daneil Dan 03/18/2013, 12:13 PM

## 2013-03-18 NOTE — IPOC Note (Addendum)
Overall Plan of Care [facility]) Patient Details Name: Walter Larsen MRN: [id number] DOB: 06/23/1935  Admitting Diagnosis: CVA  Hospital Problems: Active Problems:   ICH (intracerebral hemorrhage)     Functional Problem List: Nursing Bladder;Bowel;Endurance;Medication Management;Safety;Skin Integrity  PT Balance;Behavior;Edema;Endurance;Motor;Pain;Perception;Safety;Sensory  OT Balance;Cognition;Endurance;Motor;Perception;Safety;Vision  SLP Cognition;Linguistic  TR         Basic ADL's: OT Bathing;Dressing;Toileting     Advanced  ADL's: OT       Transfers: PT Bed Mobility;Bed to Chair;Car;Furniture  OT Toilet;Tub/Shower     Locomotion: PT Ambulation;Wheelchair Mobility;Stairs     Additional Impairments: OT None  SLP Communication;Social Cognition comprehension;expression Problem Solving;Memory;Attention;Awareness  TR      Anticipated Outcomes Item Anticipated Outcome  Self Feeding set up  Swallowing      Basic self-care  supervision  Toileting  supervision   Bathroom Transfers supervision  Bowel/Bladder  cont of bowel and bladder with min assist  Transfers  supervision  Locomotion  supervision w/c propulsion x 150', and gait x 50', up/down 4 steps 2 rails, min assist  Communication  Mod  Cognition  Mod  Pain  pain level less than 3/10  Safety/Judgment  patient will be safe and no falls with min assist while in rehab   Therapy Plan: PT Intensity: Minimum of 1-2 x/day ,45 to 90 minutes PT Frequency: 5 out of 7 days PT Duration Estimated Length of Stay: 14-18 days OT Intensity: Minimum of 1-2 x/day, 45 to 90 minutes OT Frequency: 5 out of 7 days OT Duration/Estimated Length of Stay: 12- 14 days SLP Intensity: Minumum of 1-2 x/day, 30 to 90 minutes SLP Frequency: 5 out of 7 days SLP Duration/Estimated Length of Stay: 12-15 days       Team Interventions: Nursing Interventions Patient/Family Education;Bladder Management;Bowel Management;Disease  Management/Prevention;Pain Management;Medication Management;Skin Care/Wound Management;Psychosocial Support  PT interventions Ambulation/gait training;Balance/vestibular training;Cognitive remediation/compensation;Discharge planning;Neuromuscular re-education;Functional mobility training;DME/adaptive equipment instruction;Pain management;Patient/family education;Splinting/orthotics;UE/LE Coordination activities;UE/LE Strength taining/ROM;Therapeutic Exercise;Therapeutic Activities;Stair training;Visual/perceptual remediation/compensation;Wheelchair propulsion/positioning  OT Interventions Balance/vestibular training;Cognitive remediation/compensation;Discharge planning;DME/adaptive equipment instruction;Functional mobility training;Neuromuscular re-education;Patient/family education;Self Care/advanced ADL retraining;Therapeutic Activities;Therapeutic Exercise;UE/LE Strength taining/ROM;UE/LE Coordination activities;Visual/perceptual remediation/compensation  SLP Interventions Cognitive remediation/compensation;Cueing hierarchy;Environmental controls;Functional tasks;Internal/external aids;Patient/family education;Speech/Language facilitation  TR Interventions    SW/CM Interventions Discharge Planning;Psychosocial Support;Patient/Family Education    Team Discharge Planning: Destination: PT-Home ,OT- Home , SLP-Home Projected Follow-up: PT-Home health PT, OT-  Home health OT, SLP-Home Health SLP;24 hour supervision/assistance Projected Equipment Needs: PT-Rolling walker with 5" wheels, OT- 3 in 1 bedside comode, SLP-None recommended by SLP Equipment Details: PT- , OT-  Patient/family involved in discharge planning: PT- Family member/caregiver,  OT-Family member/caregiver, SLP-Patient;Family member/caregiver  MD ELOS: 14 days Medical Rehab Prognosis:  Excellent Assessment: The patient has been admitted for CIR therapies. The team will be addressing, functional mobility, strength, stamina, balance,  safety, adaptive techniques/equipment, self-care, bowel and bladder mgt, patient and caregiver education, NMR, cognition, communication, visual-perceptual awareness, . Goals have been set at supervision for mobility and self-care and mod assist for cognition and communication.    Ranelle Oyster, MD, FAAPMR      See Team Conference Notes for weekly updates to the plan of care

## 2013-03-18 NOTE — Progress Notes (Signed)
Speech Language Pathology Daily Session Note  Patient Details  Name: Walter Larsen MRN: [id number] Date of Birth: 1936/04/14  Today's Date: 03/18/2013 Time: 4098-1191 Time Calculation (min): 45 min  Short Term Goals: Week 1: SLP Short Term Goal 1 (Week 1): Pt will sustain attention to basic, structured task for 60 seconds with Max cues SLP Short Term Goal 2 (Week 1): Pt will follow one-step commands with 80% accuracy with Max cues SLP Short Term Goal 3 (Week 1): Pt will answer basic yes/no questions with 80% accuracy with Max cues SLP Short Term Goal 4 (Week 1): Pt will utilize external aids to demonstrate orientation x4 with Max cues SLP Short Term Goal 5 (Week 1): Pt will name common objects with 80% accuracy with Max cues  Skilled Therapeutic Interventions: Skilled treatment session focused on cognitive-linguistic goals. Patient was lethargic and required moderate to maximal verbal and tactile cues to initiate and maintain attention to tasks for 15-25 second increments. Patient responded to basic biographical and intellectual awareness questions, stated "[employer]" when asked where he was, stated "stroke" when asked why he had to come to the hospital, oriented to self, approximate month (stated  "end of November"). Patient very perseverative with open-ended responses and during object naming tasks. He named the first object/picture in a category, but then became perseverative , tangential, and language and thoughts became confused, ie: when asked to identify colors, first one, he correctly stated "green", second one, he stated, "He's getting a little personal", and was unable to be cued back to task.     FIM:  Comprehension Comprehension Mode: Auditory Comprehension: 1-Understands basic less than 25% of the time/requires cueing 75% of the time Expression Expression Mode: Verbal Expression: 2-Expresses basic 25 - 49% of the time/requires cueing 50 - 75% of the time. Uses single  words/gestures. Social Interaction Social Interaction: 2-Interacts appropriately 25 - 49% of time - Needs frequent redirection. Problem Solving Problem Solving: 1-Solves basic less than 25% of the time - needs direction nearly all the time or does not effectively solve problems and may need a restraint for safety Memory Memory: 1-Recognizes or recalls less than 25% of the time/requires cueing greater than 75% of the time  Pain Pain Assessment Pain Assessment: No/denies pain Pain Score: 0-No pain  Therapy/Group: Individual Therapy  Pablo Lawrence 03/18/2013, 3:16 PM  Angela Nevin, MA, CCC-SLP [facility] Speech-Language Pathologist

## 2013-03-18 NOTE — Progress Notes (Signed)
Patient temp 100.4. Tylenol 650 mg at 6:17 pm for neck and back pain. MD on call notified.Urine culture still pending. No new orders noted. Will continue to monitor and rechecked temperature. Family members at bedside.

## 2013-03-18 NOTE — Progress Notes (Signed)
Physical Therapy Note  Patient Details  Name: Walter Larsen MRN: [id number] Date of Birth: 11-Mar-1936 Today's Date: 03/18/2013  Pt scheduled for 1130-12pm PT session as pt presents in bed and asleep following OT session and declined ability to participate at this time. Pt did not open eyes but states he is "too worn out" to even attempt supine therex at this time. Wife present in room and this therapist will attempt to see patient again this afternoon. If not, pt will have missed 30 min of skilled PT. Will follow up in PM.  Karolee Stamps [facility] 03/18/2013, 11:48 AM

## 2013-03-18 NOTE — Plan of Care (Signed)
Problem: RH BLADDER ELIMINATION Goal: RH STG MANAGE BLADDER WITH ASSISTANCE STG Manage Bladder With min Assistance  Outcome: Not Progressing Indwelling catheter placed today, total assist of cath

## 2013-03-18 NOTE — Progress Notes (Signed)
77 y.o. right-handed male with history of hypertension, CAD with PTCA. Admit 03/01/2013 with altered mental status and fever. Recent admission to [facility] with hemorrhagic stroke and discharged 02/27/2013 after a three-day hospital stay. Presented with increased headache and nausea as well as hallucinations and altered mental status. Cranial CT scan showed right temporal occipital parenchymal hematoma measuring up to 7 cm in light of trace intraventricular extension without hydrocephalus. Patient was placed on vancomycin /Zosyn for fever of 100.3 white blood cell count 15,000. An MRI was completed 03/05/2013 during workup of fever rule out abscess that showed a large subacute hematoma right temporoparietal lobe as well small areas of acute infarct in the right occipital parietal lobe and left temporal parietal lobe. Neurosurgery Dr. Danielle Dess consulted advise conservative care in reference of right parietal ICH. TEE completed showing no thrombus without PFO no vegetation. Infectious disease consult and for fever of unknown origin with workup ongoing including CT of the chest 03/08/2013 showed bilateral lobar and segmental pulmonary emboli and bilateral lower extremity Doppler studies consistent with deep vein thrombosis involving the right common femoral vein and right peroneal vein as well as deep vein thrombosis left peroneal vein. Underwent removable Greenfield filter 03/08/2012 per interventional radiology. Depakote was added for headache as well as seizure prevention. Bouts of urinary retention Foley catheter tube inserted x2. Plan is to try a voiding trial as mobility improves. Patient is on a regular consistency diet.  Subjective/Complaints: Slept okay according to wife. Nursing reports hematuria with in and out catheterization. Eating 15-50% of meals. No pain c/os Working with OT  Review of Systems - poor awareness of deficits  Objective: Vital Signs: Blood pressure 106/62, pulse 83,  temperature 98.8 F (37.1 C), temperature source Oral, resp. rate 17, height 5\' 8"  (1.727 m), weight 85.1 kg (187 lb 9.8 oz), SpO2 99.00%. No results found. Results for orders placed during the hospital encounter of 03/15/13 (from the past 72 hour(s))  GLUCOSE, CAPILLARY     Status: Abnormal   Collection Time    03/15/13  8:44 PM      Result Value Range   Glucose-Capillary 153 (*) 70 - 99 mg/dL  CBC WITH DIFFERENTIAL     Status: Abnormal   Collection Time    03/16/13  5:45 AM      Result Value Range   WBC 15.0 (*) 4.0 - 10.5 K/uL   RBC 3.79 (*) 4.22 - 5.81 MIL/uL   Hemoglobin 11.8 (*) 13.0 - 17.0 g/dL   HCT 62.1 (*) 30.8 - 65.7 %   MCV 93.1  78.0 - 100.0 fL   MCH 31.1  26.0 - 34.0 pg   MCHC 33.4  30.0 - 36.0 g/dL   RDW 84.6  96.2 - 95.2 %   Platelets 230  150 - 400 K/uL   Neutrophils Relative % 72  43 - 77 %   Neutro Abs 10.8 (*) 1.7 - 7.7 K/uL   Lymphocytes Relative 13  12 - 46 %   Lymphs Abs 2.0  0.7 - 4.0 K/uL   Monocytes Relative 13 (*) 3 - 12 %   Monocytes Absolute 1.9 (*) 0.1 - 1.0 K/uL   Eosinophils Relative 2  0 - 5 %   Eosinophils Absolute 0.2  0.0 - 0.7 K/uL   Basophils Relative 0  0 - 1 %   Basophils Absolute 0.0  0.0 - 0.1 K/uL  COMPREHENSIVE METABOLIC PANEL     Status: Abnormal   Collection Time    03/16/13  5:45 AM      Result Value Range   Sodium 131 (*) 135 - 145 mEq/L   Potassium 4.4  3.5 - 5.1 mEq/L   Chloride 96  96 - 112 mEq/L   CO2 24  19 - 32 mEq/L   Glucose, Bld 97  70 - 99 mg/dL   BUN 13  6 - 23 mg/dL   Creatinine, Ser 1.61  0.50 - 1.35 mg/dL   Calcium 9.3  8.4 - 09.6 mg/dL   Total Protein 6.8  6.0 - 8.3 g/dL   Albumin 2.7 (*) 3.5 - 5.2 g/dL   AST 29  0 - 37 U/L   ALT 26  0 - 53 U/L   Alkaline Phosphatase 191 (*) 39 - 117 U/L   Total Bilirubin 0.9  0.3 - 1.2 mg/dL   GFR calc non Af Amer 88 (*) >90 mL/min   GFR calc Af Amer >90  >90 mL/min   Comment: (NOTE)     The eGFR has been calculated using the CKD EPI equation.     This calculation  has not been validated in all clinical situations.     eGFR's persistently <90 mL/min signify possible Chronic Kidney     Disease.     HEENT: normal and oral mucosa moist Cardio: RRR and no murmur Resp: CTA B/L and unlabored GI: BS positive and non tender Extremity:  Pulses positive and No Edema Skin:   Intact Neuro: Alert/Oriented, Cranial Nerve II-XII normal, Abnormal Sensory Difficulty cooperating with sensory exam. Poor attention to task, Normal Motor, Abnormal FMC Ataxic/ dec FMC and Other Able to spell world forward but not backwards Musc/Skel:  Normal General no acute distress   Assessment/Plan: 1. Functional deficits secondary to R temporal/occipital IPH as well as acute infarcts in R occipital/parietal and L temporal/ prieateal which require 3+ hours per day of interdisciplinary therapy in a comprehensive inpatient rehab setting. Physiatrist is providing close team supervision and 24 hour management of active medical problems listed below. Physiatrist and rehab team continue to assess barriers to discharge/monitor patient progress toward functional and medical goals. FIM: FIM - Bathing Bathing Steps Patient Completed: Chest;Right Arm;Left Arm;Abdomen;Front perineal area;Right upper leg;Left upper leg;Right lower leg (including foot);Left lower leg (including foot) Bathing: 4: Min-Patient completes 8-9 66f 10 parts or 75+ percent  FIM - Upper Body Dressing/Undressing Upper body dressing/undressing steps patient completed: Thread/unthread right sleeve of pullover shirt/dresss;Thread/unthread left sleeve of pullover shirt/dress;Put head through opening of pull over shirt/dress;Pull shirt over trunk Upper body dressing/undressing: 0: Wears gown/pajamas-no public clothing FIM - Lower Body Dressing/Undressing Lower body dressing/undressing steps patient completed: Thread/unthread right underwear leg;Thread/unthread left underwear leg;Pull underwear up/down;Don/Doff left sock Lower  body dressing/undressing: 4: Min-Patient completed 75 plus % of tasks  FIM - Toileting Toileting steps completed by patient: Performs perineal hygiene Toileting Assistive Devices: Grab bar or rail for support Toileting: 2: Max-Patient completed 1 of 3 steps  FIM - Diplomatic Services operational officer Devices: Grab bars Toilet Transfers: 3-To toilet/BSC: Mod A (lift or lower assist);3-From toilet/BSC: Mod A (lift or lower assist)  FIM - Bed/Chair Transfer Bed/Chair Transfer Assistive Devices: HOB elevated Bed/Chair Transfer: 3: Supine > Sit: Mod A (lifting assist/Pt. 50-74%/lift 2 legs  FIM - Locomotion: Wheelchair Distance: 70 Locomotion: Wheelchair: 2: Travels 50 - 149 ft with minimal assistance (Pt.>75%) FIM - Locomotion: Ambulation Locomotion: Ambulation Assistive Devices: Designer, industrial/product Ambulation/Gait Assistance: 3: Mod assist Locomotion: Ambulation: 1: Travels less than 50 ft with moderate assistance (Pt:  50 - 74%)  Comprehension Comprehension Mode: Auditory Comprehension: 1-Understands basic less than 25% of the time/requires cueing 75% of the time  Expression Expression Mode: Verbal Expression: 4-Expresses basic 75 - 89% of the time/requires cueing 10 - 24% of the time. Needs helper to occlude trach/needs to repeat words.  Social Interaction Social Interaction: 2-Interacts appropriately 25 - 49% of time - Needs frequent redirection.  Problem Solving Problem Solving: 1-Solves basic less than 25% of the time - needs direction nearly all the time or does not effectively solve problems and may need a restraint for safety  Memory Memory: 1-Recognizes or recalls less than 25% of the time/requires cueing greater than 75% of the time  Medical Problem List and Plan:  1. Right temporal parietal intracranial hemorrhage as well as right occipital infarct  2. DVT Prophylaxis/Anticoagulation: Pulmonary emboli/DVT right common femoral vein and peroneal vein, left peroneal  vein. Status post Greenfield filter 03/08/2013  3. Pain Management/headaches. Depakote 750 mg daily  4. Neuropsych: This patient is capable of making decisions on his own behalf.  5. ID/fever. All antibiotics discontinued 03/08/2013 with workup unremarkable except for signs of DVT /PE  6. Hypertension. Norvasc 5 mg daily, Lopressor 50 mg twice a day. Monitor with increased mobility  7. Hyperlipidemia. Lipitor  8. BPH. Flomax. Check PVRs x3  9. CAD/PTCA. Aspirin prior to admission discontinued secondary to hemorrhage. No chest pain or shortness of breath  10. Urinary retention. Foley catheter tube needs to be  Reinserted, hematuria, check UCx    LOS (Days) 3 A FACE TO FACE EVALUATION WAS PERFORMED  Linnea Todisco E 03/18/2013, 7:49 AM

## 2013-03-19 ENCOUNTER — Inpatient Hospital Stay (HOSPITAL_COMMUNITY): Payer: Medicare Other

## 2013-03-19 ENCOUNTER — Inpatient Hospital Stay (HOSPITAL_COMMUNITY): Payer: Medicare Other | Admitting: Physical Therapy

## 2013-03-19 ENCOUNTER — Inpatient Hospital Stay (HOSPITAL_COMMUNITY): Payer: Medicare Other | Admitting: *Deleted

## 2013-03-19 DIAGNOSIS — R209 Unspecified disturbances of skin sensation: Secondary | ICD-10-CM

## 2013-03-19 DIAGNOSIS — I69998 Other sequelae following unspecified cerebrovascular disease: Secondary | ICD-10-CM

## 2013-03-19 DIAGNOSIS — I619 Nontraumatic intracerebral hemorrhage, unspecified: Secondary | ICD-10-CM

## 2013-03-19 LAB — URINE CULTURE: Colony Count: NO GROWTH

## 2013-03-19 MED ORDER — QUETIAPINE FUMARATE 25 MG PO TABS
25.0000 mg | ORAL_TABLET | Freq: Every day | ORAL | Status: DC
  Administered 2013-03-19: 25 mg via ORAL
  Filled 2013-03-19 (×2): qty 1

## 2013-03-19 MED ORDER — CIPROFLOXACIN HCL 250 MG PO TABS
250.0000 mg | ORAL_TABLET | Freq: Two times a day (BID) | ORAL | Status: DC
  Administered 2013-03-19 (×2): 250 mg via ORAL
  Filled 2013-03-19 (×5): qty 1

## 2013-03-19 NOTE — Plan of Care (Signed)
Problem: RH BLADDER ELIMINATION Goal: RH STG MANAGE BLADDER WITH ASSISTANCE STG Manage Bladder With min Assistance with voiding, if home with indwelling catheter min assist for maintaince by patient and/or caregiver  Outcome: Not Progressing Foley in place for now due to multiple failed voiding trials; family asking when foley is going to come out.

## 2013-03-19 NOTE — Progress Notes (Signed)
Occupational Therapy Session Note  Patient Details  Name: Walter Larsen MRN: [id number] Date of Birth: 1935/05/12  Today's Date: 03/19/2013 Time: 4098-1191 Time Calculation (min): 41 min  Short Term Goals: Week 1:  OT Short Term Goal 1 (Week 1): Pt will be able to transfer to toilet with steady assist. OT Short Term Goal 2 (Week 1): Pt will be able to don pants with min assist. OT Short Term Goal 3 (Week 1): Pt will be able to stand with steadying assist to wash buttocks. OT Short Term Goal 4 (Week 1): Pt will be able to turn head to left with mod cues during self care tasks.  Skilled Therapeutic Interventions/Progress Updates:    Pt seen for ADL retraining with focus on attention, alertness, cognition, and sit<>stand. Pt's granddaughter present during therapy session. Pt with impaired focused attention during therapy session and followed simple commands approx 5% of session. Increase in following commands to approx 20% when therapist assisted with initiating task. Pt distracted by pain at catheter (RN aware of) and constantly attempting to pull at it. Pt had eyes open approx 10% of therapy session with max cues therefore impacting amount of assist needed for self-care tasks. Required min-mod assist for sit<>stand and HOH assist for pushing up from w/c and reaching back for arm rests. Engaged in orientation questions and pt disorietned x3. Granddaughter reported pt loved his dog. Pt unable tell color, name, breed, and size of dog however did report having one. Pt able to accurately answer yes/no for name of dog demonstrating difficulty with retrieval of name to verbalize. At end of session pt completed stand pivot transfer back to bed with mod assist. Pt fearful for side lying on L side when going sit>supine requiring mod assist. Pt left with RN present to assess catheter.   Therapy Documentation Precautions:  Precautions Precautions: Fall Precaution Comments: low blood pressure, becomes  dizzy Restrictions Weight Bearing Restrictions: No General:   Vital Signs:   Pain: Pt with pain at catheter site and RN notified.   See FIM for current functional status  Therapy/Group: Individual Therapy  Daneil Dan 03/19/2013, 11:54 AM

## 2013-03-19 NOTE — Progress Notes (Signed)
Occupational Therapy Note   Name: Orvie Caradine Details  Name: Shaughn Thomley MRN: [id number] Date of Birth: [birth year] Today's Date: 03/19/2013  Time:1400-1445   45 min Pain:  General aches and pains, not specific Individual session  Focus of treatment was bed mobility, transfers,  itting balance, standing balance, attention, therapeutic activities, sustained attention, postural control ;, looking to left.  Pt. Supine in bed upon OT arrival.  Wife present.  Pt needed max tactile cues to roll to right and sit on EOB.  Pt kept eyes closed 90 % of session, He wouldl open briefly for 3 seconds to look for chair or razor.  Transferred to wc with max assist and taken to sink.  Shaved beard for 1 minute before needed cues to keep shaving.  Pt crossed midline but no eye movement to the left.  He combed his hair with mod assist.  Demonstrated motor planning problems with body in space.  Would turn the opposite way he neded to go.    Transferred back to bed and left in bed with bed alarm on and wife present.       Humberto Seals 03/19/2013, 2:04 PM

## 2013-03-19 NOTE — Progress Notes (Signed)
77 y.o. right-handed male with history of hypertension, CAD with PTCA. Admit 03/01/2013 with altered mental status and fever. Recent admission to [facility] with hemorrhagic stroke and discharged 02/27/2013 after a three-day hospital stay. Presented with increased headache and nausea as well as hallucinations and altered mental status. Cranial CT scan showed right temporal occipital parenchymal hematoma measuring up to 7 cm in light of trace intraventricular extension without hydrocephalus. Patient was placed on vancomycin /Zosyn for fever of 100.3 white blood cell count 15,000. An MRI was completed 03/05/2013 during workup of fever rule out abscess that showed a large subacute hematoma right temporoparietal lobe as well small areas of acute infarct in the right occipital parietal lobe and left temporal parietal lobe. Neurosurgery Dr. Danielle Dess consulted advise conservative care in reference of right parietal ICH. TEE completed showing no thrombus without PFO no vegetation. Infectious disease consult and for fever of unknown origin with workup ongoing including CT of the chest 03/08/2013 showed bilateral lobar and segmental pulmonary emboli and bilateral lower extremity Doppler studies consistent with deep vein thrombosis involving the right common femoral vein and right peroneal vein as well as deep vein thrombosis left peroneal vein. Underwent removable Greenfield filter 03/08/2012 per interventional radiology. Depakote was added for headache as well as seizure prevention. Bouts of urinary retention Foley catheter tube inserted x2. Plan is to try a voiding trial as mobility improves. Patient is on a regular consistency diet.  Subjective/Complaints: Had a rough night per daughter. She states he didn't sleep at all.. Trazodone made him restless and a little agitated apparently  Review of Systems - poor awareness of deficits  Objective: Vital Signs: Blood pressure 123/73, pulse 89, temperature 98.6 F  (37 C), temperature source Oral, resp. rate 18, height 5\' 8"  (1.727 m), weight 85.1 kg (187 lb 9.8 oz), SpO2 95.00%. No results found. Results for orders placed during the hospital encounter of 03/15/13 (from the past 72 hour(s))  URINALYSIS, ROUTINE W REFLEX MICROSCOPIC     Status: Abnormal   Collection Time    03/18/13  7:10 AM      Result Value Range   Color, Urine YELLOW  YELLOW   APPearance CLEAR  CLEAR   Specific Gravity, Urine 1.019  1.005 - 1.030   pH 6.5  5.0 - 8.0   Glucose, UA NEGATIVE  NEGATIVE mg/dL   Hgb urine dipstick LARGE (*) NEGATIVE   Bilirubin Urine NEGATIVE  NEGATIVE   Ketones, ur NEGATIVE  NEGATIVE mg/dL   Protein, ur NEGATIVE  NEGATIVE mg/dL   Urobilinogen, UA 0.2  0.0 - 1.0 mg/dL   Nitrite NEGATIVE  NEGATIVE   Leukocytes, UA SMALL (*) NEGATIVE  URINE MICROSCOPIC-ADD ON     Status: None   Collection Time    03/18/13  7:10 AM      Result Value Range   Squamous Epithelial / LPF RARE  RARE   WBC, UA 0-2  <3 WBC/hpf   RBC / HPF 21-50  <3 RBC/hpf   Bacteria, UA RARE  RARE     HEENT: normal and oral mucosa moist Cardio: RRR and no murmur Resp: CTA B/L and unlabored GI: BS positive and non tender Extremity:  Pulses positive and No Edema Skin:   Intact Neuro: Alert/Oriented, Cranial Nerve II-XII normal, Abnormal Sensory Difficulty cooperating with sensory exam. Poor attention to task, Normal Motor, Abnormal FMC Ataxic/ dec FMC and Other Able to spell world forward but not backwards Musc/Skel:  Normal General no acute distress   Assessment/Plan:  1. Functional deficits secondary to R temporal/occipital IPH as well as acute infarcts in R occipital/parietal and L temporal/ prieateal which require 3+ hours per day of interdisciplinary therapy in a comprehensive inpatient rehab setting. Physiatrist is providing close team supervision and 24 hour management of active medical problems listed below. Physiatrist and rehab team continue to assess barriers to  discharge/monitor patient progress toward functional and medical goals. FIM: FIM - Bathing Bathing Steps Patient Completed: Chest;Right Arm;Left Arm;Abdomen;Front perineal area;Right upper leg;Left upper leg Bathing: 3: Mod-Patient completes 5-7 63f 10 parts or 50-74%  FIM - Upper Body Dressing/Undressing Upper body dressing/undressing steps patient completed: Put head through opening of pull over shirt/dress;Pull shirt over trunk;Thread/unthread right sleeve of pullover shirt/dresss Upper body dressing/undressing: 4: Min-Patient completed 75 plus % of tasks FIM - Lower Body Dressing/Undressing Lower body dressing/undressing steps patient completed: Thread/unthread right underwear leg;Pull underwear up/down;Don/Doff left sock;Thread/unthread right pants leg Lower body dressing/undressing: 3: Mod-Patient completed 50-74% of tasks  FIM - Toileting Toileting steps completed by patient: Performs perineal hygiene Toileting Assistive Devices: Grab bar or rail for support Toileting: 2: Max-Patient completed 1 of 3 steps  FIM - Diplomatic Services operational officer Devices: Grab bars Toilet Transfers: 3-To toilet/BSC: Mod A (lift or lower assist);3-From toilet/BSC: Mod A (lift or lower assist)  FIM - Bed/Chair Transfer Bed/Chair Transfer Assistive Devices: Bed rails Bed/Chair Transfer: 4: Supine > Sit: Min A (steadying Pt. > 75%/lift 1 leg);3: Sit > Supine: Mod A (lifting assist/Pt. 50-74%/lift 2 legs);4: Bed > Chair or W/C: Min A (steadying Pt. > 75%);3: Chair or W/C > Bed: Mod A (lift or lower assist)  FIM - Locomotion: Wheelchair Distance: 70 Locomotion: Wheelchair: 2: Travels 50 - 149 ft with moderate assistance (Pt: 50 - 74%) FIM - Locomotion: Ambulation Locomotion: Ambulation Assistive Devices: Designer, industrial/product Ambulation/Gait Assistance: 3: Mod assist Locomotion: Ambulation: 1: Travels less than 50 ft with moderate assistance (Pt: 50 - 74%)  Comprehension Comprehension Mode:  Auditory Comprehension: 1-Understands basic less than 25% of the time/requires cueing 75% of the time  Expression Expression Mode: Verbal Expression: 2-Expresses basic 25 - 49% of the time/requires cueing 50 - 75% of the time. Uses single words/gestures.  Social Interaction Social Interaction: 2-Interacts appropriately 25 - 49% of time - Needs frequent redirection.  Problem Solving Problem Solving: 1-Solves basic less than 25% of the time - needs direction nearly all the time or does not effectively solve problems and may need a restraint for safety  Memory Memory: 1-Recognizes or recalls less than 25% of the time/requires cueing greater than 75% of the time  Medical Problem List and Plan:  1. Right temporal parietal intracranial hemorrhage as well as right occipital infarct  2. DVT Prophylaxis/Anticoagulation: Pulmonary emboli/DVT right common femoral vein and peroneal vein, left peroneal vein. Status post Greenfield filter 03/08/2013  3. Pain Management/headaches. Depakote 750 mg daily  4. Neuropsych: This patient is capable of making decisions on his own behalf.  5. ID/fever. All antibiotics discontinued 03/08/2013 with workup unremarkable except for signs of DVT /PE  6. Hypertension. Norvasc 5 mg daily, Lopressor 50 mg twice a day. Monitor with increased mobility  7. Hyperlipidemia. Lipitor  8. BPH. Flomax.  ---foley placed per below 9. CAD/PTCA. Aspirin prior to admission discontinued secondary to hemorrhage. No chest pain or shortness of breath  10. Urinary retention. Foley catheter replaced  -UA equivocal, ucx pending. Does have low grade temp however  -will add low dose cipro for now 11. Insomnia- dc trazodone  and try seroquel    LOS (Days) 4 A FACE TO FACE EVALUATION WAS PERFORMED  Shontell Prosser T 03/19/2013, 7:57 AM

## 2013-03-19 NOTE — Progress Notes (Signed)
Physical Therapy Session Note  Patient Details  Name: Walter Larsen MRN: [id number] Date of Birth: 1935/10/29  Today's Date: 03/19/2013 Time: 1003-1059 Time Calculation (min): 56 min  Short Term Goals: Week 1:  PT Short Term Goal 1 (Week 1): pt will perform supine>< sit with min assist 5/5 trials PT Short Term Goal 2 (Week 1): pt will perform basic transfer w/c>< bed with min assist 5/5 trial PT Short Term Goal 3 (Week 1): pt will propel w/c x 100' with min assist PT Short Term Goal 4 (Week 1): pt will perform gait x 25' with min assist PT Short Term Goal 5 (Week 1): pt will ascend/descend 4 steps 2 rails with min assist  Skilled Therapeutic Interventions/Progress Updates:  Pt was seen bedside in the am, with grand daughter at bedside. Pt is sleepy but will awake to stimuli, requiring frequent verbal cues to remain focused on task. Pt rolled R/L with side rail to assist with donning pants with min A. Pt transferred supine to edge of bed with side rail and min to mod A. Pt transferred edge of bed to w/c with mod A and verbal cues. Pt propelled w/c about 70 feet with mod A and verbal cues utilizing B UE and LEs. In gym treatment focused on sit to stand transfers with rolling walker. Pt required frequent verbal cues to preform transfers safely. Pt easily frustrated and requiring frequent verbal cues to remain focused on task.    Therapy Documentation Precautions:  Precautions Precautions: Fall Precaution Comments: low blood pressure, becomes dizzy Restrictions Weight Bearing Restrictions: No General:   Pain: No c/o pain.   See FIM for current functional status  Therapy/Group: Individual Therapy  Rayford Halsted 03/19/2013, 12:48 PM

## 2013-03-19 NOTE — Plan of Care (Signed)
Problem: RH SAFETY Goal: RH STG ADHERE TO SAFETY PRECAUTIONS W/ASSISTANCE/DEVICE STG Adhere to Safety Precautions With mod Assistance/Device.  Outcome: Not Progressing Frequent attempts to get OOB at HS; pulls at foley catheter, requires mittens for safety

## 2013-03-19 NOTE — Progress Notes (Signed)
Speech Language Pathology Daily Session Note  Patient Details  Name: Walter Larsen MRN: [id number] Date of Birth: Dec 30, 1935  Today's Date: 03/19/2013 Time: 1330-1400 Time Calculation (min): 30 min  Short Term Goals: Week 1: SLP Short Term Goal 1 (Week 1): Pt will sustain attention to basic, structured task for 60 seconds with Max cues SLP Short Term Goal 2 (Week 1): Pt will follow one-step commands with 80% accuracy with Max cues SLP Short Term Goal 3 (Week 1): Pt will answer basic yes/no questions with 80% accuracy with Max cues SLP Short Term Goal 4 (Week 1): Pt will utilize external aids to demonstrate orientation x4 with Max cues SLP Short Term Goal 5 (Week 1): Pt will name common objects with 80% accuracy with Max cues  Skilled Therapeutic Interventions: Skilled treatment focused on cognitive-linguistic goals. SLP facilitated session with Max cues for patient to open his eyes in order to engage in therapy activities. Pt demonstrated orientation to location and situation with Mod-Max question cues. Max cues were provided for sustained attention in less than 30 second intervals. Pt continues to be verbally perseverative. Continue plan of care.   FIM:  Comprehension Comprehension Mode: Auditory Comprehension: 1-Understands basic less than 25% of the time/requires cueing 75% of the time Expression Expression Mode: Verbal Expression: 2-Expresses basic 25 - 49% of the time/requires cueing 50 - 75% of the time. Uses single words/gestures. Social Interaction Social Interaction: 2-Interacts appropriately 25 - 49% of time - Needs frequent redirection. Problem Solving Problem Solving: 1-Solves basic less than 25% of the time - needs direction nearly all the time or does not effectively solve problems and may need a restraint for safety Memory Memory: 1-Recognizes or recalls less than 25% of the time/requires cueing greater than 75% of the time  Pain Pain Assessment Pain Assessment:  No/denies pain  Therapy/Group: Individual Therapy   Maxcine Ham, M.A. CCC-SLP [phone]   Maxcine Ham 03/19/2013, 4:25 PM

## 2013-03-19 NOTE — Plan of Care (Signed)
Problem: RH BLADDER ELIMINATION Goal: RH STG MANAGE BLADDER WITH ASSISTANCE STG Manage Bladder With min Assistance with voiding, if home with indwelling catheter min assist for maintaince by patient and/or caregiver  Outcome: Not Progressing Acute urinary retention. Uses foley catheter.

## 2013-03-20 MED ORDER — QUETIAPINE FUMARATE 50 MG PO TABS
50.0000 mg | ORAL_TABLET | Freq: Every day | ORAL | Status: DC
  Administered 2013-03-20 – 2013-03-30 (×11): 50 mg via ORAL
  Filled 2013-03-20 (×12): qty 1

## 2013-03-20 NOTE — Progress Notes (Signed)
77 y.o. right-handed male with history of hypertension, CAD with PTCA. Admit 03/01/2013 with altered mental status and fever. Recent admission to [facility] with hemorrhagic stroke and discharged 02/27/2013 after a three-day hospital stay. Presented with increased headache and nausea as well as hallucinations and altered mental status. Cranial CT scan showed right temporal occipital parenchymal hematoma measuring up to 7 cm in light of trace intraventricular extension without hydrocephalus. Patient was placed on vancomycin /Zosyn for fever of 100.3 white blood cell count 15,000. An MRI was completed 03/05/2013 during workup of fever rule out abscess that showed a large subacute hematoma right temporoparietal lobe as well small areas of acute infarct in the right occipital parietal lobe and left temporal parietal lobe. Neurosurgery Dr. Danielle Dess consulted advise conservative care in reference of right parietal ICH. TEE completed showing no thrombus without PFO no vegetation. Infectious disease consult and for fever of unknown origin with workup ongoing including CT of the chest 03/08/2013 showed bilateral lobar and segmental pulmonary emboli and bilateral lower extremity Doppler studies consistent with deep vein thrombosis involving the right common femoral vein and right peroneal vein as well as deep vein thrombosis left peroneal vein. Underwent removable Greenfield filter 03/08/2012 per interventional radiology. Depakote was added for headache as well as seizure prevention. Bouts of urinary retention Foley catheter tube inserted x2. Plan is to try a voiding trial as mobility improves. Patient is on a regular consistency diet.  Subjective/Complaints: Didn't settle down until 0400 this morning. Now resting. restless  Review of Systems - poor awareness of deficits  Objective: Vital Signs: Blood pressure 119/72, pulse 79, temperature 99.4 F (37.4 C), temperature source Axillary, resp. rate 20, height  5\' 8"  (1.727 m), weight 85.1 kg (187 lb 9.8 oz), SpO2 99.00%. No results found. Results for orders placed during the hospital encounter of 03/15/13 (from the past 72 hour(s))  URINALYSIS, ROUTINE W REFLEX MICROSCOPIC     Status: Abnormal   Collection Time    03/18/13  7:10 AM      Result Value Range   Color, Urine YELLOW  YELLOW   APPearance CLEAR  CLEAR   Specific Gravity, Urine 1.019  1.005 - 1.030   pH 6.5  5.0 - 8.0   Glucose, UA NEGATIVE  NEGATIVE mg/dL   Hgb urine dipstick LARGE (*) NEGATIVE   Bilirubin Urine NEGATIVE  NEGATIVE   Ketones, ur NEGATIVE  NEGATIVE mg/dL   Protein, ur NEGATIVE  NEGATIVE mg/dL   Urobilinogen, UA 0.2  0.0 - 1.0 mg/dL   Nitrite NEGATIVE  NEGATIVE   Leukocytes, UA SMALL (*) NEGATIVE  URINE CULTURE     Status: None   Collection Time    03/18/13  7:10 AM      Result Value Range   Specimen Description URINE, RANDOM     Special Requests NONE     Culture  Setup Time       Value: 03/18/2013 08:19     Performed at [facility] Count       Value: NO GROWTH     Performed at [facility]   Culture       Value: NO GROWTH     Performed at [facility]   Report Status 03/19/2013 FINAL    URINE MICROSCOPIC-ADD ON     Status: None   Collection Time    03/18/13  7:10 AM      Result Value Range   Squamous Epithelial / LPF RARE  RARE   WBC, UA 0-2  <3 WBC/hpf   RBC / HPF 21-50  <3 RBC/hpf   Bacteria, UA RARE  RARE     HEENT: normal and oral mucosa moist Cardio: RRR and no murmur Resp: CTA B/L and unlabored GI: BS positive and non tender Extremity:  Pulses positive and No Edema Skin:   Intact Neuro: Awakens to verbal and tactile stim, Cranial Nerve II-XII normal, Abnormal Sensory Difficulty cooperating with sensory exam. Poor attention to task, Normal Motor, Abnormal FMC Ataxic/ dec FMC and Other Able to spell world forward but not backwards Musc/Skel:  Normal General no acute distress   Assessment/Plan: 1.  Functional deficits secondary to R temporal/occipital IPH as well as acute infarcts in R occipital/parietal and L temporal/ prieateal which require 3+ hours per day of interdisciplinary therapy in a comprehensive inpatient rehab setting. Physiatrist is providing close team supervision and 24 hour management of active medical problems listed below. Physiatrist and rehab team continue to assess barriers to discharge/monitor patient progress toward functional and medical goals. FIM: FIM - Bathing Bathing Steps Patient Completed: Chest Bathing: 1: Total-Patient completes 0-2 of 10 parts or less than 25%  FIM - Upper Body Dressing/Undressing Upper body dressing/undressing steps patient completed: Put head through opening of pull over shirt/dress;Pull shirt over trunk Upper body dressing/undressing: 3: Mod-Patient completed 50-74% of tasks FIM - Lower Body Dressing/Undressing Lower body dressing/undressing steps patient completed: Thread/unthread right underwear leg;Pull underwear up/down;Don/Doff left sock;Thread/unthread right pants leg Lower body dressing/undressing: 3: Mod-Patient completed 50-74% of tasks  FIM - Toileting Toileting steps completed by patient: Performs perineal hygiene Toileting Assistive Devices: Grab bar or rail for support Toileting: 2: Max-Patient completed 1 of 3 steps  FIM - Diplomatic Services operational officer Devices: Grab bars Toilet Transfers: 3-To toilet/BSC: Mod A (lift or lower assist);3-From toilet/BSC: Mod A (lift or lower assist)  FIM - Bed/Chair Transfer Bed/Chair Transfer Assistive Devices: Bed rails Bed/Chair Transfer: 3: Supine > Sit: Mod A (lifting assist/Pt. 50-74%/lift 2 legs;3: Bed > Chair or W/C: Mod A (lift or lower assist)  FIM - Locomotion: Wheelchair Distance: 70 Locomotion: Wheelchair: 2: Travels 50 - 149 ft with moderate assistance (Pt: 50 - 74%) FIM - Locomotion: Ambulation Locomotion: Ambulation Assistive Devices: Dealer Ambulation/Gait Assistance: 3: Mod assist Locomotion: Ambulation: 1: Travels less than 50 ft with moderate assistance (Pt: 50 - 74%)  Comprehension Comprehension Mode: Auditory Comprehension: 1-Understands basic less than 25% of the time/requires cueing 75% of the time  Expression Expression Mode: Verbal Expression: 2-Expresses basic 25 - 49% of the time/requires cueing 50 - 75% of the time. Uses single words/gestures.  Social Interaction Social Interaction: 2-Interacts appropriately 25 - 49% of time - Needs frequent redirection.  Problem Solving Problem Solving: 1-Solves basic less than 25% of the time - needs direction nearly all the time or does not effectively solve problems and may need a restraint for safety  Memory Memory: 1-Recognizes or recalls less than 25% of the time/requires cueing greater than 75% of the time  Medical Problem List and Plan:  1. Right temporal parietal intracranial hemorrhage as well as right occipital infarct  2. DVT Prophylaxis/Anticoagulation: Pulmonary emboli/DVT right common femoral vein and peroneal vein, left peroneal vein. Status post Greenfield filter 03/08/2013  3. Pain Management/headaches. Depakote 750 mg daily  4. Neuropsych: This patient is capable of making decisions on his own behalf.  5. ID/fever. All antibiotics discontinued 03/08/2013 with workup unremarkable except for signs of DVT /PE  6. Hypertension. Norvasc 5 mg daily, Lopressor 50 mg twice a day. Monitor with increased mobility  7. Hyperlipidemia. Lipitor  8. BPH. Flomax.  ---foley placed per below 9. CAD/PTCA. Aspirin prior to admission discontinued secondary to hemorrhage. No chest pain or shortness of breath  10. Urinary retention. Foley catheter replaced  -UA equivocal, ucx -, dc cipro 11. Insomnia/night time restlessness--increase seroquel tonight    LOS (Days) 5 A FACE TO FACE EVALUATION WAS PERFORMED  Viriginia Amendola T 03/20/2013, 7:42 AM

## 2013-03-20 NOTE — Progress Notes (Signed)
Patient asleep at this time.  Patient's son states patient did not really calm down until approximately 0400.  "He was grabbing his foley and grabbed the side rail at one time.  It's like his mind won't shut down."

## 2013-03-21 ENCOUNTER — Inpatient Hospital Stay (HOSPITAL_COMMUNITY): Payer: Medicare Other | Admitting: Occupational Therapy

## 2013-03-21 ENCOUNTER — Inpatient Hospital Stay (HOSPITAL_COMMUNITY): Payer: Medicare Other

## 2013-03-21 ENCOUNTER — Inpatient Hospital Stay (HOSPITAL_COMMUNITY): Payer: Medicare Other | Admitting: Physical Therapy

## 2013-03-21 NOTE — Plan of Care (Signed)
Problem: RH BOWEL ELIMINATION Goal: RH STG MANAGE BOWEL WITH ASSISTANCE STG Manage Bowel with min Assistance.  Outcome: Not Progressing No BM since 03/18/13, will receive sorbitol today after therapies

## 2013-03-21 NOTE — Plan of Care (Signed)
Problem: RH BLADDER ELIMINATION Goal: RH STG MANAGE BLADDER WITH ASSISTANCE STG Manage Bladder With min Assistance with voiding, if home with indwelling catheter min assist for maintaince by patient and/or caregiver  Outcome: Progressing Foley out today at 1000 am , no void so far, continue to monitor

## 2013-03-21 NOTE — Plan of Care (Signed)
Problem: RH BOWEL ELIMINATION Goal: RH STG MANAGE BOWEL W/MEDICATION W/ASSISTANCE STG Manage Bowel with Medication with min Assistance.  Outcome: Not Progressing Medications had to be placed in patient's mouth today, unable to hold cup independently, no issues with swallowing noted

## 2013-03-21 NOTE — Progress Notes (Signed)
Speech Language Pathology Daily Session Note  Patient Details  Name: Walter Larsen MRN: [id number] Date of Birth: 27-Aug-1935  Today's Date: 03/21/2013 Time: 4098-1191 Time Calculation (min): 45 min  Short Term Goals: Week 1: SLP Short Term Goal 1 (Week 1): Pt will sustain attention to basic, structured task for 60 seconds with Max cues SLP Short Term Goal 2 (Week 1): Pt will follow one-step commands with 80% accuracy with Max cues SLP Short Term Goal 3 (Week 1): Pt will answer basic yes/no questions with 80% accuracy with Max cues SLP Short Term Goal 4 (Week 1): Pt will utilize external aids to demonstrate orientation x4 with Max cues SLP Short Term Goal 5 (Week 1): Pt will name common objects with 80% accuracy with Max cues  Skilled Therapeutic Interventions: Treatment focused on cognitive-linguistic goals. SLP facilitated session with Max cues for orientation. Pt unable to benefit from written cues at this time. Pt participated in expressive language task, and was able to correctly able to name common objects with Max cues. Pt's performance is impacted by decreased sustained attention, requiring Max cues to sustain attention for <30 second intervals. He also keeps his eyes closed for the majority of the session. Continue plan of care.   FIM:  Comprehension Comprehension Mode: Auditory Comprehension: 1-Understands basic less than 25% of the time/requires cueing 75% of the time Expression Expression Mode: Verbal Expression: 2-Expresses basic 25 - 49% of the time/requires cueing 50 - 75% of the time. Uses single words/gestures. Social Interaction Social Interaction: 2-Interacts appropriately 25 - 49% of time - Needs frequent redirection. Problem Solving Problem Solving: 1-Solves basic less than 25% of the time - needs direction nearly all the time or does not effectively solve problems and may need a restraint for safety Memory Memory: 1-Recognizes or recalls less than 25% of the  time/requires cueing greater than 75% of the time  Pain Pain Assessment Pain Assessment: 0-10 Pain Score: 7  Pain Location: Back Pain Intervention(s): RN made aware  Therapy/Group: Individual Therapy   Maxcine Ham, M.A. CCC-SLP [phone]   Maxcine Ham 03/21/2013, 4:38 PM

## 2013-03-21 NOTE — Progress Notes (Signed)
Physical Therapy Session Note  Patient Details  Name: Walter Larsen MRN: [id number] Date of Birth: September 21, 1935  Today's Date: 03/21/2013 Time: 0900-1000 and 4098-1191 Time Calculation (min): 60 min and 32 min  Short Term Goals: Week 1:  PT Short Term Goal 1 (Week 1): pt will perform supine>< sit with min assist 5/5 trials PT Short Term Goal 2 (Week 1): pt will perform basic transfer w/c>< bed with min assist 5/5 trial PT Short Term Goal 3 (Week 1): pt will propel w/c x 100' with min assist PT Short Term Goal 4 (Week 1): pt will perform gait x 25' with min assist PT Short Term Goal 5 (Week 1): pt will ascend/descend 4 steps 2 rails with min assist  Skilled Therapeutic Interventions/Progress Updates:    Treatment Session 1: Pt received semi-reclined in bed with wife present. Requires coaxing to agree to session, max encouragement to continue participation throughout. Pt with increased lethargy, maintains eyes closed for >95% of session.  Slowly performed supine>sit. Seated EOM, performed sit>stand with mod A, static standing at rolling walker with mod A while attempting to don pants. Pt requested to sit, then insistent on lying down due to fatigue.  Supine vitals: BP 143/80, HR 100, SpO2 97%. After slowly performing supine>sit, pt expresses discomfort (unable to describe) but denies dizziness, lightheadedness. Immediate (sitting) vitals: 89/67, HR 106, SpO2 99%. Pt immediately repositioned into supine; attempted to re-assess vitals but pt resistant to placement of BP cuff on UE. RN notified of vitals, pt presentation. Donned bilat thigh-high TED hose. Vital signs normalized within 5 minutes, after which therapist assisted pt in donning pants. Raised HOB to 30 degrees. Pt asymptomatic and BP 121/76, HR 101, SpO2 97%. Therapist departed with pt semi-reclined bed with wife present, 3 bed rails up, bed alarm on, and all needs within reach.   Treatment Session 2: Pt received semi-reclined in bed with wife  present. Pt asleep but easily awakened; agreeable to session. Pt with increased verbalization, as compared with previous session; however, pt again maintained eyes closed for majority of session until standing activity was attempted. HOB increased in 30-degree increments from 0 to 60 degrees, allowing 3-5 minutes between each transition; vitals checked (BP maintained within range of 118/68-122/72 throughout). Performed supine>sit (with HOB elevated to 60 degrees) with bed rail, mod A; immediate BP 101/69, HR 93; pt asymptomatic. Performed sit<>stand x3 reps from EOB to rolling walker with mod A, manual facilitation of anterior weight shift. Static standing (<20 seconds per trial due to pt request to sit) with bilat UE support at RW, widened BOS, hip/knee flexion, and downward gaze. Manual facilitation provided at R axilla, L ribs for upright posture. Therapist departed with ppt in bed (HOB at 60 degrees) with wife present, 3 bed rails up, bed alarm on, and all needs within reach. Pt asymptomatic and BP stable.  Therapy Documentation Precautions:  Precautions Precautions: Fall Precaution Comments: low blood pressure, becomes dizzy Restrictions Weight Bearing Restrictions: No Vital Signs: Therapy Vitals Pulse Rate: 93 BP: 101/68 mmHg Patient Position, if appropriate: Sitting Oxygen Therapy SpO2: 97 % O2 Device: None (Room air) Pain: Pain Assessment Pain Assessment: FLACC Pain Score: 3  Pain Type: Chronic pain Pain Location: Back Pain Orientation: Lower Pain Descriptors / Indicators: Aching Pain Frequency: Intermittent Pain Onset: Gradual Patients Stated Pain Goal: 1 Pain Intervention(s): RN made aware;Repositioned (RN had administered medication prior to session to address pain.) Multiple Pain Sites: No  See FIM for current functional status  Therapy/Group: Individual Therapy  Calvert Cantor 03/21/2013, 12:39 PM

## 2013-03-21 NOTE — Progress Notes (Signed)
Occupational Therapy Session Note  Patient Details  Name: Walter Larsen MRN: [id number] Date of Birth: 07-Apr-1936  Today's Date: 03/21/2013 Time: 1010-1100 Time Calculation (min): 50 min, (missed 10 minutes due to too sleepy)  Short Term Goals: Week 1:  OT Short Term Goal 1 (Week 1): Pt will be able to transfer to toilet with steady assist. OT Short Term Goal 2 (Week 1): Pt will be able to don pants with min assist. OT Short Term Goal 3 (Week 1): Pt will be able to stand with steadying assist to wash buttocks. OT Short Term Goal 4 (Week 1): Pt will be able to turn head to left with mod cues during self care tasks.  Skilled Therapeutic Interventions/Progress Updates:  Patient sleeping in bed upon arrival with wife at his bedside.  Patient required max multi modal cues to engage in self care session at a bed level.  Patient would not open eyes during session except on ~3 occasions for just a few seconds and he fell asleep during session approximately 4 times.  Patient able to assist with some UB bath and dressing, and assisted with pulling himself up in bed with BUEs.  Patient with episode of low BP just prior to OT's arrival for this session-about 10 min into the session his BP was 118/74 with HOB up.  Session ended early secondary to unable to keep patient awake.  Patient positioned comfortably on his right side with wife present.  Therapy Documentation Precautions:  Precautions Precautions: Fall Precaution Comments: low blood pressure, becomes dizzy Restrictions Weight Bearing Restrictions: No Pain: No reports of pain ADL: See FIM for current functional status  Therapy/Group: Individual Therapy  Ariadne Rissmiller 03/21/2013, 12:35 PM

## 2013-03-21 NOTE — Progress Notes (Signed)
Discussed with Deatra Ina, PA urinary catheter discontinued at 1000am, voided at 1430 with BM and scanned 221, at 1900 no void, abdomen distended, scan for greater than 900 in and out cath for 900cc, no new orders given at this time. Roberts-VonCannon, Vanita Cannell Elon Jester

## 2013-03-21 NOTE — Progress Notes (Signed)
Patient working with therapy, became orthostatic and returned to bed,lying 143/80,pulse 100 saturation on room air 97%, sitting 89/67, pulse 106, saturations 99% on room air, reminded therapist that patient needed to have thigh high teds hose on before out of bed. TED hose placed, no new orders given at this time, pt placed in bed and therapy limited secondary to patient's tolerance. Roberts-VonCannon, Jaremy Nosal Elon Jester

## 2013-03-21 NOTE — Progress Notes (Signed)
77 y.o. right-handed male with history of hypertension, CAD with PTCA. Admit 03/01/2013 with altered mental status and fever. Recent admission to [facility] with hemorrhagic stroke and discharged 02/27/2013 after a three-day hospital stay. Presented with increased headache and nausea as well as hallucinations and altered mental status. Cranial CT scan showed right temporal occipital parenchymal hematoma measuring up to 7 cm in light of trace intraventricular extension without hydrocephalus. Patient was placed on vancomycin /Zosyn for fever of 100.3 white blood cell count 15,000. An MRI was completed 03/05/2013 during workup of fever rule out abscess that showed a large subacute hematoma right temporoparietal lobe as well small areas of acute infarct in the right occipital parietal lobe and left temporal parietal lobe. Neurosurgery Dr. Danielle Dess consulted advise conservative care in reference of right parietal ICH. TEE completed showing no thrombus without PFO no vegetation. Infectious disease consult and for fever of unknown origin with workup ongoing including CT of the chest 03/08/2013 showed bilateral lobar and segmental pulmonary emboli and bilateral lower extremity Doppler studies consistent with deep vein thrombosis involving the right common femoral vein and right peroneal vein as well as deep vein thrombosis left peroneal vein. Underwent removable Greenfield filter 03/08/2012 per interventional radiology. Depakote was added for headache as well as seizure prevention. Bouts of urinary retention Foley catheter tube inserted x2. Plan is to try a voiding trial as mobility improves. Patient is on a regular consistency diet.  Subjective/Complaints:  Urine is yellow and clear Intermittently awake last noc per wife Review of Systems - poor awareness of deficits  Objective: Vital Signs: Blood pressure 146/75, pulse 84, temperature 98 F (36.7 C), temperature source Oral, resp. rate 20, height 5\' 8"   (1.727 m), weight 85.1 kg (187 lb 9.8 oz), SpO2 97.00%. No results found. Results for orders placed during the hospital encounter of 03/15/13 (from the past 72 hour(s))  URINALYSIS, ROUTINE W REFLEX MICROSCOPIC     Status: Abnormal   Collection Time    03/18/13  7:10 AM      Result Value Range   Color, Urine YELLOW  YELLOW   APPearance CLEAR  CLEAR   Specific Gravity, Urine 1.019  1.005 - 1.030   pH 6.5  5.0 - 8.0   Glucose, UA NEGATIVE  NEGATIVE mg/dL   Hgb urine dipstick LARGE (*) NEGATIVE   Bilirubin Urine NEGATIVE  NEGATIVE   Ketones, ur NEGATIVE  NEGATIVE mg/dL   Protein, ur NEGATIVE  NEGATIVE mg/dL   Urobilinogen, UA 0.2  0.0 - 1.0 mg/dL   Nitrite NEGATIVE  NEGATIVE   Leukocytes, UA SMALL (*) NEGATIVE  URINE CULTURE     Status: None   Collection Time    03/18/13  7:10 AM      Result Value Range   Specimen Description URINE, RANDOM     Special Requests NONE     Culture  Setup Time       Value: 03/18/2013 08:19     Performed at [facility] Count       Value: NO GROWTH     Performed at [facility]   Culture       Value: NO GROWTH     Performed at [facility]   Report Status 03/19/2013 FINAL    URINE MICROSCOPIC-ADD ON     Status: None   Collection Time    03/18/13  7:10 AM      Result Value Range   Squamous Epithelial / LPF RARE  RARE   WBC, UA 0-2  <3 WBC/hpf   RBC / HPF 21-50  <3 RBC/hpf   Bacteria, UA RARE  RARE     HEENT: normal and oral mucosa moist Cardio: RRR and no murmur Resp: CTA B/L and unlabored GI: BS positive and non tender Extremity:  Pulses positive and No Edema Skin:   Intact Neuro: Awakens to verbal and tactile stim, Cranial Nerve II-XII normal, Abnormal Sensory Difficulty cooperating with sensory exam. Poor attention to task, Normal Motor, Abnormal FMC Ataxic/ dec FMC and Other Able to spell world forward but not backwards Musc/Skel:  Normal General no acute distress   Assessment/Plan: 1. Functional  deficits secondary to R temporal/occipital IPH as well as acute infarcts in R occipital/parietal and L temporal/ prieateal which require 3+ hours per day of interdisciplinary therapy in a comprehensive inpatient rehab setting. Physiatrist is providing close team supervision and 24 hour management of active medical problems listed below. Physiatrist and rehab team continue to assess barriers to discharge/monitor patient progress toward functional and medical goals. FIM: FIM - Bathing Bathing Steps Patient Completed: Chest Bathing: 1: Total-Patient completes 0-2 of 10 parts or less than 25%  FIM - Upper Body Dressing/Undressing Upper body dressing/undressing steps patient completed: Put head through opening of pull over shirt/dress;Pull shirt over trunk Upper body dressing/undressing: 3: Mod-Patient completed 50-74% of tasks FIM - Lower Body Dressing/Undressing Lower body dressing/undressing steps patient completed: Thread/unthread right underwear leg;Pull underwear up/down;Don/Doff left sock;Thread/unthread right pants leg Lower body dressing/undressing: 3: Mod-Patient completed 50-74% of tasks  FIM - Toileting Toileting steps completed by patient: Performs perineal hygiene Toileting Assistive Devices: Grab bar or rail for support Toileting: 2: Max-Patient completed 1 of 3 steps  FIM - Diplomatic Services operational officer Devices: Grab bars Toilet Transfers: 3-To toilet/BSC: Mod A (lift or lower assist);3-From toilet/BSC: Mod A (lift or lower assist)  FIM - Bed/Chair Transfer Bed/Chair Transfer Assistive Devices: Bed rails Bed/Chair Transfer: 3: Supine > Sit: Mod A (lifting assist/Pt. 50-74%/lift 2 legs;3: Bed > Chair or W/C: Mod A (lift or lower assist)  FIM - Locomotion: Wheelchair Distance: 70 Locomotion: Wheelchair: 2: Travels 50 - 149 ft with moderate assistance (Pt: 50 - 74%) FIM - Locomotion: Ambulation Locomotion: Ambulation Assistive Devices: Dealer Ambulation/Gait Assistance: 3: Mod assist Locomotion: Ambulation: 1: Travels less than 50 ft with moderate assistance (Pt: 50 - 74%)  Comprehension Comprehension Mode: Auditory Comprehension: 1-Understands basic less than 25% of the time/requires cueing 75% of the time  Expression Expression Mode: Verbal Expression: 2-Expresses basic 25 - 49% of the time/requires cueing 50 - 75% of the time. Uses single words/gestures.  Social Interaction Social Interaction: 2-Interacts appropriately 25 - 49% of time - Needs frequent redirection.  Problem Solving Problem Solving: 1-Solves basic less than 25% of the time - needs direction nearly all the time or does not effectively solve problems and may need a restraint for safety  Memory Memory: 1-Recognizes or recalls less than 25% of the time/requires cueing greater than 75% of the time  Medical Problem List and Plan:  1. Right temporal parietal intracranial hemorrhage as well as right occipital infarct  2. DVT Prophylaxis/Anticoagulation: Pulmonary emboli/DVT right common femoral vein and peroneal vein, left peroneal vein. Status post Greenfield filter 03/08/2013  3. Pain Management/headaches. Depakote 750 mg daily  4. Neuropsych: This patient is capable of making decisions on his own behalf.  5. ID/fever. All antibiotics discontinued 03/08/2013 with workup unremarkable except for signs of DVT /PE  6. Hypertension. Norvasc 5 mg daily, Lopressor 50 mg twice a day. Monitor with increased mobility  7. Hyperlipidemia. Lipitor  8. BPH. Flomax.  ---foley removal 12/8, voiding trial monitor hematuria 9. CAD/PTCA. Aspirin prior to admission discontinued secondary to hemorrhage. No chest pain or shortness of breath  10. Urinary retention. Foley catheter replaced  -UA equivocal, ucx -, dc cipro 11. Insomnia/night time restlessness--increase seroquel tonight    LOS (Days) 6 A FACE TO FACE EVALUATION WAS PERFORMED  KIRSTEINS,ANDREW  E 03/21/2013, 6:30 AM

## 2013-03-22 ENCOUNTER — Inpatient Hospital Stay (HOSPITAL_COMMUNITY): Payer: Medicare Other

## 2013-03-22 ENCOUNTER — Inpatient Hospital Stay (HOSPITAL_COMMUNITY): Payer: Medicare Other | Admitting: Physical Therapy

## 2013-03-22 DIAGNOSIS — I69998 Other sequelae following unspecified cerebrovascular disease: Secondary | ICD-10-CM

## 2013-03-22 DIAGNOSIS — R209 Unspecified disturbances of skin sensation: Secondary | ICD-10-CM

## 2013-03-22 DIAGNOSIS — I2510 Atherosclerotic heart disease of native coronary artery without angina pectoris: Secondary | ICD-10-CM

## 2013-03-22 DIAGNOSIS — I619 Nontraumatic intracerebral hemorrhage, unspecified: Secondary | ICD-10-CM

## 2013-03-22 DIAGNOSIS — I10 Essential (primary) hypertension: Secondary | ICD-10-CM

## 2013-03-22 MED ORDER — FUROSEMIDE 20 MG PO TABS
20.0000 mg | ORAL_TABLET | Freq: Every day | ORAL | Status: DC
  Administered 2013-03-22 – 2013-04-06 (×16): 20 mg via ORAL
  Filled 2013-03-22 (×17): qty 1

## 2013-03-22 NOTE — Progress Notes (Signed)
Speech Language Pathology Weekly Progress & Daily Session Notes  Patient Details  Name: Walter Larsen MRN: [id number] Date of Birth: Apr 18, 1935  Today's Date: 03/22/2013 Time: 1330-1415 Time Calculation (min): 45 min  Short Term Goals: Week 1: SLP Short Term Goal 1 (Week 1): Pt will sustain attention to basic, structured task for 60 seconds with Max cues SLP Short Term Goal 1 - Progress (Week 1): Not met SLP Short Term Goal 2 (Week 1): Pt will follow one-step commands with 80% accuracy with Max cues SLP Short Term Goal 2 - Progress (Week 1): Met SLP Short Term Goal 3 (Week 1): Pt will answer basic yes/no questions with 80% accuracy with Max cues SLP Short Term Goal 3 - Progress (Week 1): Not met SLP Short Term Goal 4 (Week 1): Pt will utilize external aids to demonstrate orientation x4 with Max cues SLP Short Term Goal 4 - Progress (Week 1): Not met SLP Short Term Goal 5 (Week 1): Pt will name common objects with 80% accuracy with Max cues SLP Short Term Goal 5 - Progress (Week 1): Met  New Short Term Goals: Week 2: SLP Short Term Goal 1 (Week 2): Pt will sustain attention to basic, structured task for 60 seconds with Max cues SLP Short Term Goal 2 (Week 2): Pt will follow one-step commands with 80% accuracy with Mod cues SLP Short Term Goal 3 (Week 2): Pt will answer basic yes/no questions with 80% accuracy with Max cues SLP Short Term Goal 4 (Week 2): Pt will demonstrate orientation x4 with Max cues SLP Short Term Goal 5 (Week 2): Pt will name common objects with 80% accuracy with Mod cues  Weekly Progress Updates: Pt has met 2 out of 5 goals during this reporting period. He requires Max cues for sustained attention in <30 second intervals and to open his eyes to engage in therapy. He requires Max cues for basic commands, orientation, and naming. Pt/family education with wife is ongoing. Pt will benefit from continued SLP services to maximize cognitive function and reduce burden of  care.   SLP Intensity: Minumum of 1-2 x/day, 30 to 90 minutes SLP Frequency: 5 out of 7 days SLP Duration/Estimated Length of Stay: 12-15 days SLP Treatment/Interventions: Cognitive remediation/compensation;Cueing hierarchy;Environmental controls;Functional tasks;Internal/external aids;Patient/family education;Speech/Language facilitation  Daily Session Skilled Therapeutic Intervention: Skilled treatment focused on cognitive-linguistic goals. SLP facilitated session with Max cues for orientation. Pt participated in expressive language task, requiring Max cues for sustained attention <30 seconds. Pt verbalized his need to use the bathroom with Mod I, and pt was returned to room for toileting. Pt required Max cues to follow commands for transfer to commode. Continue plan of care.  FIM:  Comprehension Comprehension Mode: Auditory Comprehension: 1-Understands basic less than 25% of the time/requires cueing 75% of the time Expression Expression Mode: Verbal Expression: 2-Expresses basic 25 - 49% of the time/requires cueing 50 - 75% of the time. Uses single words/gestures. Social Interaction Social Interaction: 2-Interacts appropriately 25 - 49% of time - Needs frequent redirection. Problem Solving Problem Solving: 1-Solves basic less than 25% of the time - needs direction nearly all the time or does not effectively solve problems and may need a restraint for safety Memory Memory: 1-Recognizes or recalls less than 25% of the time/requires cueing greater than 75% of the time General    Pain Pain Assessment Pain Assessment: FLACC Pain Score: 5  Faces Pain Scale: No hurt Pain Type: Acute pain Pain Location: Leg Pain Orientation: Right;Left Pain Descriptors / Indicators:  Grimacing;Moaning Pain Onset: With Activity Patients Stated Pain Goal: 2 Pain Intervention(s): Rest;Repositioned;Elevated extremity Multiple Pain Sites: No  Therapy/Group: Individual Therapy   Maxcine Ham, M.A.  CCC-SLP [phone]   Maxcine Ham 03/22/2013, 4:32 PM

## 2013-03-22 NOTE — Plan of Care (Signed)
Problem: RH SAFETY Goal: RH STG ADHERE TO SAFETY PRECAUTIONS W/ASSISTANCE/DEVICE STG Adhere to Safety Precautions With mod Assistance/Device.  Outcome: Not Progressing Pt requiring max cues and assistance

## 2013-03-22 NOTE — Progress Notes (Signed)
Physical Therapy Session Note  Patient Details  Name: Walter Larsen MRN: [id number] Date of Birth: 06-05-1935  Today's Date: 03/22/2013 Time: 1118-1202 and [id number] Time Calculation (min): 44 min and 31 min  Short Term Goals: Week 1:  PT Short Term Goal 1 (Week 1): pt will perform supine>< sit with min assist 5/5 trials PT Short Term Goal 2 (Week 1): pt will perform basic transfer w/c>< bed with min assist 5/5 trial PT Short Term Goal 3 (Week 1): pt will propel w/c x 100' with min assist PT Short Term Goal 4 (Week 1): pt will perform gait x 25' with min assist PT Short Term Goal 5 (Week 1): pt will ascend/descend 4 steps 2 rails with min assist  Skilled Therapeutic Interventions/Progress Updates:    Treatment Session 1: Pt received semi-reclined in bed, asleep but easily awakened. Agrees to therapy session after mod coaxing. TED hose (thigh high) on bilat LE's. Eyes open for ~ 25% of session. Progressively elevated HOB to pt tolerance x2 trials. See vital signs for detailed BP and HR readings. Pt able to sit EOB x2 trials: initial trial <30 seconds prior to being repositioned in supine secondary to symptomatic decrease in BP; second trial x3.5 minutes in seated (BP stable) prior to pt repositioning self into L side lying due to pt sleepiness. Session ended with pt semi-reclined in bed with bilat LE's elevated, 3 bed rails up, bed alarm on, and all needs within reach. Vitals stable and pt reporting no symptoms. RN notified of session.  Treatment Session 2: Pt received awake and seated in w/c; accompanied by wife and granddaughter, who remained present throughout session. Pt agreeable to therapy but requires mod-max encouragement to maintain participation throughout. Session focused on increasing pt independence/stability with w/c mobility and gait. Pt performed self-propulsion of w/c x60' using bilat UE's with mod A; initially required hand-over-hand assist for technique with effective return  demonstration. Gait x12' with rolling walker requiring mod A, max encouragement throughout secondary to pt tendency to attempt to rest forearms/head on rolling walker. Upon standing, pt exhibits widened BOS, bilat hip/knee flexion, and limited cervicothoracic extension. Pt able to correct BOS with mod verbal cueing. Manual facilitation at L axilla and inferior to R ribcage for postural normalization. Gait trial ended secondary to pt adamantly maintaining rolling walker at unsafe distance; therapist unable to manually position walker within safe proximity due to pt resistance, fear. Session ended shortly thereafter secondary to pt falling asleep in chair and difficult to awaken. Eyes open for ~75% of session. Therapist departed pt room with pt seated in w/c with quick release belt in place for safety, wife/granddaughter present, and all needs within reach.   Therapy Documentation Precautions:  Precautions Precautions: Fall Precaution Comments: low blood pressure, becomes dizzy Restrictions Weight Bearing Restrictions: No General: Amount of Missed PT Time (min): 14 Minutes Missed Time Reason: Patient fatigue (after gait trial, pt fell asleep while seated in w/c) Vital Signs: Therapy Vitals BP: 108/71 mmHg Patient Position, if appropriate: Lying Pain: Pain Assessment Pain Assessment: FLACC Pain Score: 5  Faces Pain Scale: No hurt Pain Type: Acute pain Pain Location: Leg Pain Orientation: Right;Left Pain Descriptors / Indicators: Grimacing;Moaning Pain Onset: With Activity Patients Stated Pain Goal: 2 Pain Intervention(s): Rest;Repositioned;Elevated extremity Multiple Pain Sites: No Locomotion : Ambulation Ambulation/Gait Assistance: 3: Mod assist Wheelchair Mobility Distance: 60   See FIM for current functional status  Therapy/Group: Individual Therapy  Calvert Cantor 03/22/2013, 4:34 PM

## 2013-03-22 NOTE — Plan of Care (Signed)
Problem: RH BLADDER ELIMINATION Goal: RH STG MANAGE BLADDER WITH ASSISTANCE STG Manage Bladder With min Assistance with voiding, if home with indwelling catheter min assist for maintaince by patient and/or caregiver  Outcome: Not Progressing Acute urinary retention.     

## 2013-03-22 NOTE — Progress Notes (Signed)
Occupational Therapy Session Note  Patient Details  Name: Walter Larsen MRN: [id number] Date of Birth: Sep 15, 1935  Today's Date: 03/22/2013 Time: 4098-1191 Time Calculation (min): 54 min  Short Term Goals: Week 1:  OT Short Term Goal 1 (Week 1): Pt will be able to transfer to toilet with steady assist. OT Short Term Goal 2 (Week 1): Pt will be able to don pants with min assist. OT Short Term Goal 3 (Week 1): Pt will be able to stand with steadying assist to wash buttocks. OT Short Term Goal 4 (Week 1): Pt will be able to turn head to left with mod cues during self care tasks.  Skilled Therapeutic Interventions/Progress Updates:    Pt seen for ADL retraining with focus on following simple commands, attention to task, attention to L, and functional transfers. Pt not oriented to place or time this AM. Pt perseverating throughout session station "Please lord don't let me get lost on an airplane." Required max cues to focus on task and redirect. Patient assisted with bathing requiring max cues (verbal and tactile) and mod physical assist. Pt required mod-max assist for sit<>stand during self-care tasks secondary to pain and fear with movement. Required assist to initiated UB dressing then pt completed with min assist. Pt falling asleep during therapy at end of session and required max assist for stand pivot transfer w/c>bed. Pt left supine in bed with all needs in reach. Pt orthostatic during sit<>stand with BP 114/69 in sitting and dropping to 93/59 after sit<>stand and report of dizziness.   Therapy Documentation Precautions:  Precautions Precautions: Fall Precaution Comments: low blood pressure, becomes dizzy Restrictions Weight Bearing Restrictions: No General: General Amount of Missed OT Time (min): 6 Minutes Vital Signs:   Pain: Pt reporting pain but inconsistent with location.  See FIM for current functional status  Therapy/Group: Individual Therapy  Daneil Dan 03/22/2013,  10:44 AM

## 2013-03-22 NOTE — Progress Notes (Signed)
77 y.o. right-handed male with history of hypertension, CAD with PTCA. Admit 03/01/2013 with altered mental status and fever. Recent admission to [facility] with hemorrhagic stroke and discharged 02/27/2013 after a three-day hospital stay. Presented with increased headache and nausea as well as hallucinations and altered mental status. Cranial CT scan showed right temporal occipital parenchymal hematoma measuring up to 7 cm in light of trace intraventricular extension without hydrocephalus. Patient was placed on vancomycin /Zosyn for fever of 100.3 white blood cell count 15,000. An MRI was completed 03/05/2013 during workup of fever rule out abscess that showed a large subacute hematoma right temporoparietal lobe as well small areas of acute infarct in the right occipital parietal lobe and left temporal parietal lobe. Neurosurgery Dr. Danielle Dess consulted advise conservative care in reference of right parietal ICH. TEE completed showing no thrombus without PFO no vegetation. Infectious disease consult and for fever of unknown origin with workup ongoing including CT of the chest 03/08/2013 showed bilateral lobar and segmental pulmonary emboli and bilateral lower extremity Doppler studies consistent with deep vein thrombosis involving the right common femoral vein and right peroneal vein as well as deep vein thrombosis left peroneal vein. Underwent removable Greenfield filter 03/08/2012 per interventional radiology. Depakote was added for headache as well as seizure prevention. Bouts of urinary retention Foley catheter tube inserted x2. Plan is to try a voiding trial as mobility improves. Patient is on a regular consistency diet.  Subjective/Complaints:  Urine is yellow and clear Intermittently awake last noc per wife Review of Systems - poor awareness of deficits  Objective: Vital Signs: Blood pressure 115/68, pulse 84, temperature 97.9 F (36.6 C), temperature source Oral, resp. rate 18, height 5'  8" (1.727 m), weight 85.1 kg (187 lb 9.8 oz), SpO2 95.00%. No results found. No results found for this or any previous visit (from the past 72 hour(s)).   HEENT: normal and oral mucosa moist Cardio: RRR and no murmur Resp: CTA B/L and unlabored GI: BS positive and non tender Extremity:  Pulses positive and No Edema Skin:   Intact Neuro: Awakens to verbal and tactile stim, Cranial Nerve II-XII normal, Abnormal Sensory Difficulty cooperating with sensory exam. Poor attention to task, Normal Motor, Abnormal FMC Ataxic/ dec FMC and Other Able to spell world forward but not backwards Musc/Skel:  Normal General no acute distress   Assessment/Plan: 1. Functional deficits secondary to R temporal/occipital IPH as well as acute infarcts in R occipital/parietal and L temporal/ prieateal which require 3+ hours per day of interdisciplinary therapy in a comprehensive inpatient rehab setting. Physiatrist is providing close team supervision and 24 hour management of active medical problems listed below. Physiatrist and rehab team continue to assess barriers to discharge/monitor patient progress toward functional and medical goals. FIM: FIM - Bathing Bathing Steps Patient Completed: Chest;Right Arm;Left Arm Bathing: 2: Max-Patient completes 3-4 7f 10 parts or 25-49% (bed level HOB up)  FIM - Upper Body Dressing/Undressing Upper body dressing/undressing steps patient completed: Thread/unthread right sleeve of pullover shirt/dresss;Thread/unthread left sleeve of pullover shirt/dress Upper body dressing/undressing: 3: Mod-Patient completed 50-74% of tasks (bed level) FIM - Lower Body Dressing/Undressing Lower body dressing/undressing steps patient completed: Thread/unthread right underwear leg;Pull underwear up/down;Don/Doff left sock;Thread/unthread right pants leg Lower body dressing/undressing: 1: Total-Patient completed less than 25% of tasks (bed level)  FIM - Toileting Toileting steps completed by  patient: Performs perineal hygiene Toileting Assistive Devices: Grab bar or rail for support Toileting: 2: Max-Patient completed 1 of 3 steps  FIM -  Diplomatic Services operational officer Devices: Therapist, music Transfers: 3-To toilet/BSC: Mod A (lift or lower assist);3-From toilet/BSC: Mod A (lift or lower assist)  FIM - Bed/Chair Transfer Bed/Chair Transfer Assistive Devices: HOB elevated;Bed rails Bed/Chair Transfer: 3: Supine > Sit: Mod A (lifting assist/Pt. 50-74%/lift 2 legs;3: Sit > Supine: Mod A (lifting assist/Pt. 50-74%/lift 2 legs)  FIM - Locomotion: Wheelchair Distance: 70 Locomotion: Wheelchair: 2: Travels 50 - 149 ft with moderate assistance (Pt: 50 - 74%) FIM - Locomotion: Ambulation Locomotion: Ambulation Assistive Devices: Designer, industrial/product Ambulation/Gait Assistance: 3: Mod assist Locomotion: Ambulation: 1: Travels less than 50 ft with moderate assistance (Pt: 50 - 74%)  Comprehension Comprehension Mode: Auditory Comprehension: 1-Understands basic less than 25% of the time/requires cueing 75% of the time  Expression Expression Mode: Verbal Expression: 2-Expresses basic 25 - 49% of the time/requires cueing 50 - 75% of the time. Uses single words/gestures.  Social Interaction Social Interaction: 2-Interacts appropriately 25 - 49% of time - Needs frequent redirection.  Problem Solving Problem Solving: 1-Solves basic less than 25% of the time - needs direction nearly all the time or does not effectively solve problems and may need a restraint for safety  Memory Memory: 1-Recognizes or recalls less than 25% of the time/requires cueing greater than 75% of the time  Medical Problem List and Plan:  1. Right temporal parietal intracranial hemorrhage as well as right occipital infarct  2. DVT Prophylaxis/Anticoagulation: Pulmonary emboli/DVT right common femoral vein and peroneal vein, left peroneal vein. Status post Greenfield filter 03/08/2013  3. Pain  Management/headaches. Depakote 750 mg daily  4. Neuropsych: This patient is capable of making decisions on his own behalf.  5. ID/fever. All antibiotics discontinued 03/08/2013 with workup unremarkable except for signs of DVT /PE  6. Hypertension. Norvasc 5 mg daily, Lopressor 50 mg twice a day. Monitor with increased mobility  7. Hyperlipidemia. Lipitor  8. BPH. Flomax.  ---foley removal 12/8, voiding trial monitor hematuria 9. CAD/PTCA. Aspirin prior to admission discontinued secondary to hemorrhage. No chest pain or shortness of breath  10. Urinary retention. Foley catheter replaced  -UA equivocal, ucx -, dc cipro 11. Insomnia/night time restlessness--increase seroquel tonight 12.  Edema BLE no pain or redness, albumin is mildly reduced, ECHO 03/07/2013, "normal EF, no sig valvular disease, hold norvasc, give Lasix, use TEDs   LOS (Days) 7 A FACE TO FACE EVALUATION WAS PERFORMED  Mahrukh Seguin E 03/22/2013, 6:47 AM

## 2013-03-22 NOTE — Progress Notes (Signed)
NUTRITION FOLLOW-UP  DOCUMENTATION CODES Per approved criteria  -Not Applicable   INTERVENTION: Continue Ensure Complete to PO BID. Consider appetite stimulant in setting of very poor oral intake. If intake does not improve and aggressive nutrition therapy desired - recommend short-term nutrition support. RD to continue to follow nutrition care plan.  Nutrition Diagnosis: Inadequate oral intake related to poor appetite as evidenced by limited PO intake. Ongoing.  Goal: Intake to meet >90% of estimated nutrition needs.  Monitor:  weight trends, lab trends, I/O's, PO intake, supplement tolerance  ASSESSMENT: PMHx significant for HTN and CAD. Admitted 11/18 with AMS and fever, recent CVA. Cranial CT scan showed right temporal occipital parenchymal hematoma. Admitted to inpatient rehab for deconditioning from hospitalization on 12/2. Pt was followed by RD staff during acute hospitalization.  Continues on a Dysphagia 3 diet with Heart Healthy restrictions. Continues with orders for Ensure Complete PO BID. Meal intake remains poor, pt consuming 10-25% of meals.  Per chart review, pt seems restless and requiring foley catheter. Discussed intake with RN. Pt's son has told staff that pt is reporting ongoing taste issues. Pt will only drink 1/2 of an Ensure per RN and that's only if pt receives maximum encouragement from family.  Sodium is low at 131 and trending down.  Height: Ht Readings from Last 1 Encounters:  03/15/13 5\' 8"  (1.727 m)    Weight: Wt Readings from Last 1 Encounters:  03/16/13 187 lb 9.8 oz (85.1 kg)  Admit wt 187 lb - stable  BMI:  Body mass index is 28.53 kg/(m^2). Overweight  Estimated Nutritional Needs: Kcal: 1700 - 2000 Protein: 80 - 95 g Fluid: 1.7 - 2 liters  Skin: stage II sacrum  Diet Order: Dysphagia 3  EDUCATION NEEDS: -No education needs identified at this time   Intake/Output Summary (Last 24 hours) at 03/22/13 1016 Last data filed at  03/22/13 0745  Gross per 24 hour  Intake   1080 ml  Output   1975 ml  Net   -895 ml    Last BM: 12/8  Labs:   Recent Labs Lab 03/16/13 0545  NA 131*  K 4.4  CL 96  CO2 24  BUN 13  CREATININE 0.76  CALCIUM 9.3  GLUCOSE 97    CBG (last 3)  No results found for this basename: GLUCAP,  in the last 72 hours  Scheduled Meds: . atorvastatin  20 mg Oral QHS  . divalproex  750 mg Oral Daily  . feeding supplement (ENSURE COMPLETE)  237 mL Oral BID BM  . furosemide  20 mg Oral Daily  . metoprolol tartrate  50 mg Oral BID  . pantoprazole  40 mg Oral Daily  . QUEtiapine  50 mg Oral QHS  . tamsulosin  0.4 mg Oral QPC breakfast    Continuous Infusions: none     Jarold Motto MS, RD, LDN Pager: [phone] After-hours pager: [phone]

## 2013-03-22 NOTE — Plan of Care (Signed)
Problem: RH Dressing Goal: LTG Patient will perform lower body dressing w/assist (OT) LTG: Patient will perform lower body dressing with assist, with/without cues in positioning using equipment (OT)  Downgraded to min assist secondary to slow progress in therapy and impaired cognition   Problem: RH Toilet Transfers Goal: LTG Patient will perform toilet transfers w/assist (OT) LTG: Patient will perform toilet transfers with assist, with/without cues using equipment (OT)  Downgraded to min assist secondary to slow progression and impaired cognition.  Problem: RH Tub/Shower Transfers Goal: LTG Patient will perform tub/shower transfers w/assist (OT) LTG: Patient will perform tub/shower transfers with assist, with/without cues using equipment (OT)  Downgraded to min assist secondary to slow progression and impaired cognition.

## 2013-03-22 NOTE — Plan of Care (Signed)
Problem: RH BLADDER ELIMINATION Goal: RH STG MANAGE BLADDER WITH ASSISTANCE STG Manage Bladder With min Assistance with voiding, if home with indwelling catheter min assist for maintaince by patient and/or caregiver  Outcome: Not Progressing Pt requiring I&O caths q6-8 hrs

## 2013-03-23 ENCOUNTER — Inpatient Hospital Stay (HOSPITAL_COMMUNITY): Payer: Medicare Other | Admitting: Physical Therapy

## 2013-03-23 ENCOUNTER — Inpatient Hospital Stay (HOSPITAL_COMMUNITY): Payer: Medicare Other

## 2013-03-23 LAB — CBC WITH DIFFERENTIAL/PLATELET
Basophils Absolute: 0 10*3/uL (ref 0.0–0.1)
Basophils Relative: 0 % (ref 0–1)
Eosinophils Relative: 0 % (ref 0–5)
Hemoglobin: 10.8 g/dL — ABNORMAL LOW (ref 13.0–17.0)
MCH: 30.9 pg (ref 26.0–34.0)
MCHC: 33.3 g/dL (ref 30.0–36.0)
MCV: 92.6 fL (ref 78.0–100.0)
Monocytes Relative: 18 % — ABNORMAL HIGH (ref 3–12)
Neutro Abs: 14.4 10*3/uL — ABNORMAL HIGH (ref 1.7–7.7)
Neutrophils Relative %: 75 % (ref 43–77)
Platelets: 281 10*3/uL (ref 150–400)

## 2013-03-23 MED ORDER — METHYLPHENIDATE HCL 5 MG PO TABS
5.0000 mg | ORAL_TABLET | Freq: Two times a day (BID) | ORAL | Status: DC
  Administered 2013-03-23 – 2013-04-06 (×28): 5 mg via ORAL
  Filled 2013-03-23 (×28): qty 1

## 2013-03-23 NOTE — Progress Notes (Signed)
Speech Language Pathology Daily Session Note  Patient Details  Name: Dartagnan Beavers MRN: [id number] Date of Birth: 04/01/1936  Today's Date: 03/23/2013 Time: 8295-6213 Time Calculation (min): 45 min  Short Term Goals: Week 2: SLP Short Term Goal 1 (Week 2): Pt will sustain attention to basic, structured task for 60 seconds with Max cues SLP Short Term Goal 2 (Week 2): Pt will follow one-step commands with 80% accuracy with Mod cues SLP Short Term Goal 3 (Week 2): Pt will answer basic yes/no questions with 80% accuracy with Max cues SLP Short Term Goal 4 (Week 2): Pt will demonstrate orientation x4 with Max cues SLP Short Term Goal 5 (Week 2): Pt will name common objects with 80% accuracy with Mod cues  Skilled Therapeutic Interventions: Treatment focused on cognitive goals. SLP facilitated session with Max cues for alertness and sustained attention for completion of one-step commands for toileting, dressing, and hand washing. Pt oriented to person, but required Max cues for orientation to location and situation. Pt required Max cues for alertness and focused attention to task as session progressed and pt fatigued. Continue plan of care.   FIM:  Comprehension Comprehension Mode: Auditory Comprehension: 1-Understands basic less than 25% of the time/requires cueing 75% of the time Expression Expression Mode: Verbal Expression: 2-Expresses basic 25 - 49% of the time/requires cueing 50 - 75% of the time. Uses single words/gestures. Social Interaction Social Interaction: 2-Interacts appropriately 25 - 49% of time - Needs frequent redirection. Problem Solving Problem Solving: 1-Solves basic less than 25% of the time - needs direction nearly all the time or does not effectively solve problems and may need a restraint for safety Memory Memory: 1-Recognizes or recalls less than 25% of the time/requires cueing greater than 75% of the time  Pain Pain Assessment Pain Assessment: No/denies  pain Pain Score: 5  Pain Type: Chronic pain Pain Location: Back Pain Orientation: Lower Pain Descriptors / Indicators: Aching;Grimacing Pain Frequency: Intermittent Pain Onset: Gradual Patients Stated Pain Goal: 3 Pain Intervention(s): Medication (See eMAR);Repositioned Multiple Pain Sites: No  Therapy/Group: Individual Therapy   Maxcine Ham, M.A. CCC-SLP [phone]   Maxcine Ham 03/23/2013, 5:03 PM

## 2013-03-23 NOTE — Progress Notes (Signed)
77 y.o. right-handed male with history of hypertension, CAD with PTCA. Admit 03/01/2013 with altered mental status and fever. Recent admission to [facility] with hemorrhagic stroke and discharged 02/27/2013 after a three-day hospital stay. Presented with increased headache and nausea as well as hallucinations and altered mental status. Cranial CT scan showed right temporal occipital parenchymal hematoma measuring up to 7 cm in light of trace intraventricular extension without hydrocephalus. Patient was placed on vancomycin /Zosyn for fever of 100.3 white blood cell count 15,000. An MRI was completed 03/05/2013 during workup of fever rule out abscess that showed a large subacute hematoma right temporoparietal lobe as well small areas of acute infarct in the right occipital parietal lobe and left temporal parietal lobe. Neurosurgery Dr. Danielle Dess consulted advise conservative care in reference of right parietal ICH. TEE completed showing no thrombus without PFO no vegetation. Infectious disease consult and for fever of unknown origin with workup ongoing including CT of the chest 03/08/2013 showed bilateral lobar and segmental pulmonary emboli and bilateral lower extremity Doppler studies consistent with deep vein thrombosis involving the right common femoral vein and right peroneal vein as well as deep vein thrombosis left peroneal vein. Underwent removable Greenfield filter 03/08/2012 per interventional radiology. Depakote was added for headache as well as seizure prevention. Bouts of urinary retention Foley catheter tube inserted x2 Subjective/Complaints:  No problems overnite per wife. Inc of urine but residual about Review of Systems - poor awareness of deficits  Objective: Vital Signs: Blood pressure 125/76, pulse 88, temperature 98.2 F (36.8 C), temperature source Oral, resp. rate 18, height 5\' 8"  (1.727 m), weight 85.1 kg (187 lb 9.8 oz), SpO2 97.00%. No results found. No results found  for this or any previous visit (from the past 72 hour(s)).   HEENT: normal and oral mucosa moist Cardio: RRR and no murmur Resp: CTA B/L and unlabored GI: BS positive and non tender Extremity:  Pulses positive and No Edema Skin:   Intact Neuro: Awakens to verbal and tactile stim, Cranial Nerve II-XII normal, Abnormal Sensory Difficulty cooperating with sensory exam. Poor attention to task, Normal Motor, Abnormal FMC Ataxic/ dec FMC and Other Able to spell world forward but not backwards Musc/Skel:  Normal General no acute distress   Assessment/Plan: 1. Functional deficits secondary to R temporal/occipital IPH as well as acute infarcts in R occipital/parietal and L temporal/ prieateal which require 3+ hours per day of interdisciplinary therapy in a comprehensive inpatient rehab setting. Physiatrist is providing close team supervision and 24 hour management of active medical problems listed below. Physiatrist and rehab team continue to assess barriers to discharge/monitor patient progress toward functional and medical goals. Team conference today please see physician documentation under team conference tab, met with team face-to-face to discuss problems,progress, and goals. Formulized individual treatment plan based on medical history, underlying problem and comorbidities. FIM: FIM - Bathing Bathing Steps Patient Completed: Chest;Right Arm;Left Arm;Abdomen;Right upper leg;Left upper leg Bathing: 3: Mod-Patient completes 5-7 66f 10 parts or 50-74%  FIM - Upper Body Dressing/Undressing Upper body dressing/undressing steps patient completed: Thread/unthread right sleeve of pullover shirt/dresss;Put head through opening of pull over shirt/dress;Pull shirt over trunk Upper body dressing/undressing: 4: Min-Patient completed 75 plus % of tasks FIM - Lower Body Dressing/Undressing Lower body dressing/undressing steps patient completed: Pull underwear up/down;Thread/unthread left underwear leg;Pull  pants up/down Lower body dressing/undressing: 2: Max-Patient completed 25-49% of tasks  FIM - Toileting Toileting steps completed by patient: Adjust clothing prior to toileting Toileting Assistive Devices: Grab bar  or rail for support Toileting: 2: Max-Patient completed 1 of 3 steps  FIM - Diplomatic Services operational officer Devices: Elevated toilet seat;Grab bars Toilet Transfers: 2-To toilet/BSC: Max A (lift and lower assist);2-From toilet/BSC: Max A (lift and lower assist)  FIM - Press photographer Assistive Devices: HOB elevated;Bed rails Bed/Chair Transfer: 2: Sit > Supine: Max A (lifting assist/Pt. 25-49%);2: Chair or W/C > Bed: Max A (lift and lower assist)  FIM - Locomotion: Wheelchair Distance: 60 Locomotion: Wheelchair: 2: Travels 50 - 149 ft with moderate assistance (Pt: 50 - 74%) FIM - Locomotion: Ambulation Locomotion: Ambulation Assistive Devices: Designer, industrial/product Ambulation/Gait Assistance: 3: Mod assist Locomotion: Ambulation: 1: Travels less than 50 ft with moderate assistance (Pt: 50 - 74%)  Comprehension Comprehension Mode: Auditory Comprehension: 1-Understands basic less than 25% of the time/requires cueing 75% of the time  Expression Expression Mode: Verbal Expression: 2-Expresses basic 25 - 49% of the time/requires cueing 50 - 75% of the time. Uses single words/gestures.  Social Interaction Social Interaction: 2-Interacts appropriately 25 - 49% of time - Needs frequent redirection.  Problem Solving Problem Solving: 1-Solves basic less than 25% of the time - needs direction nearly all the time or does not effectively solve problems and may need a restraint for safety  Memory Memory: 1-Recognizes or recalls less than 25% of the time/requires cueing greater than 75% of the time  Medical Problem List and Plan:  1. Right temporal parietal intracranial hemorrhage as well as right occipital infarct  2. DVT  Prophylaxis/Anticoagulation: Pulmonary emboli/DVT right common femoral vein and peroneal vein, left peroneal vein. 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 Pain Management/headaches. Depakote 750 mg daily  4. Neuropsych: This patient is capable of making decisions on his own behalf.  5. ID/fever. All antibiotics discontinued 03/08/2013 with workup unremarkable except for signs of DVT /PE  6. Hypertension. Norvasc 5 mg daily, Lopressor 50 mg twice a day. Monitor with increased mobility  7. Hyperlipidemia. Lipitor  8. BPH. Flomax.  ---foley removal 12/8, voiding trial monitor hematuria, inc but not fully emptying 9. CAD/PTCA. Aspirin prior to admission discontinued secondary to hemorrhage. No chest pain or shortness of breath  10. Urinary retention. Foley catheter replaced  -UA equivocal, ucx -, dc cipro 11. Insomnia/night time restlessness--increase seroquel tonight 12.  Edema BLE no pain or redness, albumin is mildly reduced, ECHO 03/07/2013, "normal EF, no sig valvular disease, hold norvasc, give Lasix, use TEDs   LOS (Days) 8 A FACE TO FACE EVALUATION WAS PERFORMED  Naiomi Musto E 03/23/2013, 7:16 AM

## 2013-03-23 NOTE — Plan of Care (Signed)
Problem: RH BLADDER ELIMINATION Goal: RH STG MANAGE BLADDER WITH ASSISTANCE STG Manage Bladder With min Assistance with voiding, if home with indwelling catheter min assist for maintaince by patient and/or caregiver  Outcome: Not Progressing Requiring in and out cath, total assist of staff

## 2013-03-23 NOTE — Patient Care Conference (Signed)
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 ICH (intracerebral hemorrhage) 03/15/2013  . Dry eyes 03/08/2013  . Hyponatremia 03/08/2013  . Headache 03/08/2013  . Acute encephalopathy 03/08/2013  . Pulmonary emboli 03/08/2013  . Other and unspecified hyperlipidemia 03/02/2013  . Bradycardia 03/02/2013  . Hypokalemia 03/02/2013  . Hemorrhagic stroke 03/01/2013  . Essential hypertension, benign 03/01/2013  . Fever, unspecified 03/01/2013    Expected Discharge Date: Expected Discharge Date: 04/01/13  Team Members Present: Physician leading conference: Dr. Claudette Laws Social Worker Present: Dossie Der, LCSW Nurse Present: Gregor Hams, RN PT Present: Wanda Plump, PT OT Present: Bretta Bang, Carollee Sires, OT SLP Present: Maxcine Ham, SLP PPS Coordinator present : Tora Duck, RN, CRRN     Current Status/Progress Goal Weekly Team Focus  Medical   still lethargic in am, urinary retention, no hematuria  complete bladder emptying  voiding trial   Bowel/Bladder   Patient remains continent of bowel with last BM 12/9.  Inability to void  continues.  receiving I/O caths q 8 for retention.  Remain continent of Bowel while improving bladder function.  Continue medications and I/O caths.  Monitor for effectiveness of interventions q shift and PRN   Swallow/Nutrition/ Hydration   Pt has very  poor appetite with pt stating "nothing tastes good" ; very minimal fluid/food inatke         ADL's   min-max assist UB dressing, mod-total assist LB dressing, mod-max assist bathing, mod-max assist functional transfers, level of assist depends on pt's awareness and fatigue level, cognition is biggest barrier  downgraded to min assist-supervision overall  cognition, NMR, functional transfers, activty tolerance, awareness, strength, sit<>stand, postural control in standing   Mobility   Min-modA for bed mobility, min-modA for bed<>chair, and modA for gait x10'  Supervision with bed mobility, bed<>chair transfers, and gait x150'  Bed mobility/transfer training, gait training, increasing pt participation/initiation with functional mobility   Communication   Max cues for confrontational naming  Mod  naming, confrontational linguistic tasks   Safety/Cognition/ Behavioral Observations  Max A for orientation, sustained attention  Mod A  orientation, sustained attention   Pain   Denies pain  Patient will have pain less than 3 on 1/10 scale during stay  Monitor for pain q shift and PRN   Skin   Bottom red and groin yeasty.   Skin will remin free of other breakdown and improve   skin checks q shift and PRN and continue treatments as ordered      *See Care Plan and progress notes for long and short-term goals.  Barriers to Discharge: see above    Possible Resolutions to Barriers:  cont rehab    Discharge Planning/Teaching Needs:  Home with wife who can not provide much physical care, she is here daily with pt      Team Discussion:  I & O caths, cont of bowel.  Need  him more alert, very lethargic in therapies.  Team feels if more alert would make good progress and do well.  Wife is here and participates in his care.  Trial of ritalin-MD.  Poor appetite.  Revisions to Treatment Plan:  Downgraded goals to supervision/min level   Continued Need for Acute Rehabilitation Level of Care: The patient  requires daily medical management by a physician with specialized training in physical medicine and rehabilitation for the following conditions: Daily direction of a multidisciplinary physical rehabilitation program to ensure safe treatment while eliciting the highest outcome that is of practical value to the patient.: Yes Daily medical management of patient stability for increased activity during participation in an intensive rehabilitation regime.: Yes Daily analysis of laboratory values and/or radiology reports with any subsequent need for medication adjustment of medical intervention for : Neurological problems;Other  Lucy Chris 03/24/2013, 9:26 AM

## 2013-03-23 NOTE — Progress Notes (Signed)
Physical Therapy Session Note  Patient Details  Name: Cicero Noy MRN: [id number] Date of Birth: May 21, 1935  Today's Date: 03/23/2013 Time: 4098-1191 and 4782-9562 Time Calculation (min): 63 min and 30 min  Short Term Goals: Week 1:  PT Short Term Goal 1 (Week 1): pt will perform supine>< sit with min assist 5/5 trials PT Short Term Goal 2 (Week 1): pt will perform basic transfer w/c>< bed with min assist 5/5 trial PT Short Term Goal 3 (Week 1): pt will propel w/c x 100' with min assist PT Short Term Goal 4 (Week 1): pt will perform gait x 25' with min assist PT Short Term Goal 5 (Week 1): pt will ascend/descend 4 steps 2 rails with min assist  Skilled Therapeutic Interventions/Progress Updates:    Treatment Session 1: Pt received seated in w/c with wife present. Pt reports no pain at rest; agreeable to session after mod coaxing. Oriented x1 to self. Self-propulsion of w/c with bilat UE's x80' with min A, mod verbal cues for redirection, and increased time due to pt distractibility, decreased willingness to participate. Gait x10' with rolling walker, mod A, and max encouragement; manual facilitation at L axilla and R ribcage to promote postural normalization. During gait, pt demonstrates widened BOS (able to self-correct with verbal cueing) and tendency to maintain walker at unsafe distance (unable to correct with cueing). Gait trial ended due to pt attempting to rest forearms/head on front rolling walker during ambulation. When asked why he was doing this, pt states, "I feel like I'm going to pass out." Pt repositioned in chair with the following seated vitals: BP 122/70, HR 98.   Transported pt to treatment gym in attempt to increase pt arousal. Performed stand-pivot transfer from w/c>mat table with rolling walker and mod A, c/o bilat leg pain upon standing. Performed static sitting EOB with bilat UE support x12 min prior to pt attempting to reposition into supine on mat. Busy, crowded  environment effective in increasing pt arousal, promoting participation.  When cued, pt performed squat-pivot from mat table>w/c with min A, cueing for hand placement. Self-propulsion of w/c (as abovementioned) x55' with supervision, cueing for technique. Session ended due to decreased pt participation secondary to decreased arousal. Therapist departed pt room with pt seated in w/c with quick release belt in place for safety, wife present, and all needs within reach.  Treatment Session 2: Pt received semi-reclined in bed with wife present; agreeable to session after min coaxing. Supine>sit with supervision, HOB elevated, bed rail; mod cueing for initiation and hand placement. Wife educated on importance of allowing pt to independently initiate and perform functional mobility. Wife verbalized understanding. Pt performed squat-pivot from bed>w/c (to R side) with min guard. Sit<>stand with mod A for weight shifting, multimodal cueing for hand placement. Gait x35' with rolling walker, min A during initial 25' and mod A during final 10' secondary to pt-reported generalized discomfort in bilat LE's with weightbearing. Transported pt to room, where session ended. Therapist departed with pt seated in w/c with wife present, quick release belt in place for safety, and all needs within reach. Pt awake, alert, and agreeable to stay seated in w/c. RN notified of session.   Pt demonstrates marked improvement in arousal, attention, and participation during this session, as compared with previous 2 days. Eyes open for >90% of session.  Therapy Documentation Precautions:  Precautions Precautions: Fall Precaution Comments: low blood pressure, becomes dizzy Restrictions Weight Bearing Restrictions: No Vital Signs: Therapy Vitals Temp: 98.3 F (36.8 C) Temp  src: Oral Pulse Rate: 93 Resp: 18 BP: 103/65 mmHg Patient Position, if appropriate: Sitting Oxygen Therapy SpO2: 98 % O2 Device: None (Room air) Pain: Pain  Assessment Pain Assessment: FLACC Pain Score: 3  Pain Type: Acute pain Pain Location: Leg Pain Orientation: Right;Left Pain Descriptors / Indicators: Discomfort;Moaning Pain Onset: On-going (with weightbearing) Patients Stated Pain Goal: 2 Pain Intervention(s): RN made aware;Repositioned;Rest Multiple Pain Sites: No Locomotion : Ambulation Ambulation/Gait Assistance: 3: Mod assist Wheelchair Mobility Distance: 80   See FIM for current functional status  Therapy/Group: Individual Therapy  Hobble, [name] 03/23/2013, 6:20 PM

## 2013-03-23 NOTE — Progress Notes (Signed)
Social Work Patient ID: Walter Larsen, male   DOB: February 19, 1936, 77 y.o.   MRN: [id number] Met with pt and wife to inform of team conference goals-supervision/min level and discharge 12/19.  Discussed with wife if we can get pt more alert then team feel she will do better. When he is alert he does well in therapies.  MD to do trial of ritalin and work on medications.  Discussed if wife had help at home, she will check with her children, but as of right now it is her. Will need to see if wife can manage pt at home and go from here.  She is willing to try and is staying her with him.  Will work on a safe discharge plan.

## 2013-03-23 NOTE — Progress Notes (Signed)
Occupational Therapy Weekly Progress Note  Patient Details  Name: Solly Derasmo MRN: [id number] Date of Birth: [birth year]  Today's Date: 03/23/2013 Time:  4098-1191 Time calculation (min): 55 min     Patient has met 2 of 4 short term goals.  Patient's progress in therapy has been limited by cognitive impairments and decreased arousal/alertness. Patient with impaired sustained attention during self-care tasks and therapeutic activities. Patient requires max cues to open eyes throughout session and keeps eyes open approx 10-20% of session. Patient is fearful at times during functional transfers secondary to keeping eyes closed. Patient is disoriented to place, time, and situation. Patient's has decreased visual scanning to left requiring max tactile, verbal, and auditory cues to locate items on left side. Patient with improved attention and arousal on this date.  Patient continues to demonstrate the following deficits: decreased attention to L, decreased coordination, impaired overall cognition, impaired attention, delayed processing, decreased strength, decreased activity tolerance, decreased postural control in standing and therefore will continue to benefit from skilled OT intervention to enhance overall performance with BADL.  Patient not progressing toward long term goals.  See goal revision..  Plan of care revisions: Patient's LTG's downgraded to min assist functional transfers, LB dressing, .  OT Short Term Goals Week 1:  OT Short Term Goal 1 (Week 1): Pt will be able to transfer to toilet with steady assist. OT Short Term Goal 1 - Progress (Week 1): Not met OT Short Term Goal 2 (Week 1): Pt will be able to don pants with min assist. OT Short Term Goal 2 - Progress (Week 1): Met OT Short Term Goal 3 (Week 1): Pt will be able to stand with steadying assist to wash buttocks. OT Short Term Goal 3 - Progress (Week 1): Met OT Short Term Goal 4 (Week 1): Pt will be able to turn head to left with  mod cues during self care tasks. OT Short Term Goal 4 - Progress (Week 1): Not met Week 2:  OT Short Term Goal 1 (Week 2): Pt demonstrate sustained attention to self-care task without cues OT Short Term Goal 2 (Week 2): Pt will consistently don shirt with min assist OT Short Term Goal 3 (Week 2): Pt will complete toilet transfer with min assist OT Short Term Goal 4 (Week 2): Pt remain alert with eyes open appox 25% of therapy session with mod cues  Skilled Therapeutic Interventions/Progress Updates:    Pt seen for ADL retraining with focus on activity tolerance, attention to task, overall cognition, sit<>stand, and standing balance. Pt received sitting in w/c with wife present and pt more alert. Completed shower with min assist and increased attention noted requiring min verbal cues to initiate task. Pt with eyes open approx 30% of shower however decreased to approx 10% of dressing task. Required mod-min assist for sit<>stand transfers throughout session and min assist for standing balance with posterior lean. Pt with LOB posteriorly when managing clothing around waste requiring max assist to correct. Pt very fatigued during dressing requiring max cues and assist for initiating dressing tasks. Pt required mod cues for At end of session pt willing to remain in w/c with visitors present. BP stable throughout session.   Therapy Documentation Precautions:  Precautions Precautions: Fall Precaution Comments: low blood pressure, becomes dizzy Restrictions Weight Bearing Restrictions: No General:   Vital Signs: Therapy Vitals Temp: 98.2 F (36.8 C) Temp src: Oral Pulse Rate: 88 Resp: 18 BP: 125/76 mmHg Patient Position, if appropriate: Lying Oxygen Therapy SpO2: 97 %  O2 Device: None (Room air) Pulse Oximetry Type: Intermittent Pain: Pt reporting pain in BLE.   Other Treatments:    See FIM for current functional status  Therapy/Group: Individual Therapy  Daneil Dan 03/23/2013, 7:28 AM

## 2013-03-24 ENCOUNTER — Inpatient Hospital Stay (HOSPITAL_COMMUNITY): Payer: Medicare Other

## 2013-03-24 ENCOUNTER — Inpatient Hospital Stay (HOSPITAL_COMMUNITY): Payer: Medicare Other | Admitting: Physical Therapy

## 2013-03-24 DIAGNOSIS — I69998 Other sequelae following unspecified cerebrovascular disease: Secondary | ICD-10-CM

## 2013-03-24 DIAGNOSIS — I619 Nontraumatic intracerebral hemorrhage, unspecified: Secondary | ICD-10-CM

## 2013-03-24 DIAGNOSIS — I2510 Atherosclerotic heart disease of native coronary artery without angina pectoris: Secondary | ICD-10-CM

## 2013-03-24 DIAGNOSIS — I10 Essential (primary) hypertension: Secondary | ICD-10-CM

## 2013-03-24 DIAGNOSIS — R209 Unspecified disturbances of skin sensation: Secondary | ICD-10-CM

## 2013-03-24 LAB — URINALYSIS, ROUTINE W REFLEX MICROSCOPIC
Bilirubin Urine: NEGATIVE
Leukocytes, UA: NEGATIVE
Nitrite: NEGATIVE
Specific Gravity, Urine: 1.014 (ref 1.005–1.030)
Urobilinogen, UA: 0.2 mg/dL (ref 0.0–1.0)
pH: 6.5 (ref 5.0–8.0)

## 2013-03-24 LAB — URINE MICROSCOPIC-ADD ON

## 2013-03-24 NOTE — Progress Notes (Signed)
Occupational Therapy Session Note  Patient Details  Name: Walter Larsen MRN: [id number] Date of Birth: 05/06/1935  Today's Date: 03/24/2013 Time: 4098-1191 Time Calculation (min): 48 min  Short Term Goals: Week 1:  OT Short Term Goal 1 (Week 1): Pt will be able to transfer to toilet with steady assist. OT Short Term Goal 1 - Progress (Week 1): Not met OT Short Term Goal 2 (Week 1): Pt will be able to don pants with min assist. OT Short Term Goal 2 - Progress (Week 1): Met OT Short Term Goal 3 (Week 1): Pt will be able to stand with steadying assist to wash buttocks. OT Short Term Goal 3 - Progress (Week 1): Met OT Short Term Goal 4 (Week 1): Pt will be able to turn head to left with mod cues during self care tasks. OT Short Term Goal 4 - Progress (Week 1): Not met  Skilled Therapeutic Interventions/Progress Updates:    Pt seen for ADL retraining with focus on sit<>stand, functional transfers, and overall cognition. Pt received in w/c. Completed stand pivot transfer w/c>shower chair with min assist. Pt very fatigued and reporting having trouble breathing. Pt orthostatic and unconscious approx 3 sec. Safely transferred back to w/c with mod assist and BP was 91/62 with HR 88. Pt awake and alert in w/c and BP increased to 122/76 with HR 92. Completed dressing with assist for orienting clothing items and positioning of BLE using crossover technique. Min assist for sit<>stand with cues for positioning of BLE and BUE. Pt required max cues to attend to task and verbalizing random sayings throughout session. At end of session therapist assist pt back to bed with min assist and pt left supine in bed with BP 126/76.   Therapy Documentation Precautions:  Precautions Precautions: Fall Precaution Comments: low blood pressure, becomes dizzy Restrictions Weight Bearing Restrictions: No General:   Vital Signs:   Pain: Pt reports pain in BLE.  See FIM for current functional status  Therapy/Group:  Individual Therapy  Daneil Dan 03/24/2013, 10:43 AM

## 2013-03-24 NOTE — Progress Notes (Signed)
Physical Therapy Weekly Progress Note  Patient Details  Name: Walter Larsen MRN: [id number] Date of Birth: 03-03-1936  Today's Date: 03/24/2013 Time: 6578-4696 and 2952-8413 Time Calculation (min): 60 min and 30 min  Patient has met 3 of 5 short term goals. Patient has not met short term goals addressing the following: 1) gait x25' with min A; 2) negotiation of 4 steps with 2 rails with min A. Progress in physical therapy has been limited by decreased patient arousal, orthostatic hypotension, bilat LE pain, and decreased pt willingness to participate.   Patient continues to demonstrate the following deficits: decreased arousal, cognitive impairments, pain, and therefore will continue to benefit from skilled PT intervention to enhance overall performance with activity tolerance, balance, postural control, ability to compensate for deficits, functional use of  right upper extremity and right lower extremity, attention, awareness and coordination.  Patient slowly progressing toward long term goals..  Abovementioned barriers to pt progress have improved markedly within past 3 treatment sessions. Will consider downgrading goals, pending continuation of progress in coming sessions.  PT Short Term Goals Week 1:  PT Short Term Goal 1 (Week 1): pt will perform supine>< sit with min assist 5/5 trials PT Short Term Goal 1 - Progress (Week 1): Met PT Short Term Goal 2 (Week 1): pt will perform basic transfer w/c>< bed with min assist 5/5 trial PT Short Term Goal 2 - Progress (Week 1): Met PT Short Term Goal 3 (Week 1): pt will propel w/c x 100' with min assist PT Short Term Goal 3 - Progress (Week 1): Met PT Short Term Goal 4 (Week 1): pt will perform gait x 25' with min assist PT Short Term Goal 4 - Progress (Week 1): Not met PT Short Term Goal 5 (Week 1): pt will ascend/descend 4 steps 2 rails with min assist PT Short Term Goal 5 - Progress (Week 1): Not met Week 2:  PT Short Term Goal 1 (Week 2):  STG's=LTG's secondary to ELOS  Skilled Therapeutic Interventions/Progress Updates:    Treatment Session 1: Pt received semi-reclined in bed; agreeable to session, stating, "I feel a lot better today, mentally and physically." Oriented x2 to self and situation. Conversation appropriate for majority of session. Supine>sit using rail with HOB flat requiring min A, multimodal cueing to initiate movement of RUE across midline, and increased time required. Seated EOM, pt maintained balance with SBA while this therapist donned thigh-high TED hose, assisted in donning shoes. Pt eager to assist with donning shoes, managing w/c parts. Squat-pivot from bed>w/c (to R side) with min A, verbal cueing for hand placement and for initiation. W/c mobility x100' with bilat UE's and min A for obstacle negotiation.   Gait x8' with rolling walker in controlled environment, initially requiring mod A and cueing for positioning of rolling walker; however, required max-totalA after initial 6' secondary to onset of pt fear and onset of nondescript bilat LE pain, causing pt to eventually attempt to sit. This PT provided max-total A to maintain pt in partial standing while additional therapist arrived to bring w/c behind pt. Pt was then repositioned in w/c. After seated rest break x3 minutes, pt agreed to re-attempt gait trial. Performed gait x10' with rolling walker and +2A for safety and wheelchair follow. Gait trial again ended per pt request secondary to c/o bilat anterior thigh pain. Pt transported to room in w/c, secondary to pt fatigue, where pt was left seated in w/c with quick release belt in place, CNA present, all needs within  reach, and pt in no apparent distress.  Treatment Session 2: Pt received semi-reclined in bed; pt continuously verbalizing despite being alone in room. Oriented x2 to self and time (current year). Pt exhibits confabulation throughout session with brief (<10 second) periods of mental clarity, appropriate  conversation. Wife present for final 15 minutes of session; son present for final 5 minutes. Session focused on increasing pt independence with bed mobility, functional transfers, and w/c mobility. Supine>sit with min A using bed rail with HOB elevated 30 degrees; verbal/tactile cueing required for initiation, hand placement. Squat-pivot from bed>w/c<>mat table with min guard to R side, min A to L side, cueing as described above. Static sitting at [facility] without UE support for initial 2.5 min, with LUE support for final 1.5 min. After 4 minutes of sitting, pt attempts to perform R side lying on mat table secondary to pt-reported sleepiness. W/c mobility x115' with bilat UE's, verbal cueing to alert pt to obstacles, and min A hand-over-hand assist with turning; pt with effective within-session carryover of technique with increased time and min verbal cueing. Once in room,pt requested to go back to bed. Therapist verbally educated pt/family members on importance of maintaining pt in upright/seated position for majority of day to promote upright tolerance, BP normalization. Pt/family members agreed. Therapist departed with pt seated in w/c with quick release belt in place for safety, all needs met, wife and son present, and pt in no apparent distress.  Therapy Documentation Precautions:  Precautions Precautions: Fall Precaution Comments: low blood pressure, becomes dizzy Restrictions Weight Bearing Restrictions: No Vital Signs: Therapy Vitals Pulse Rate: 90 Resp: 18 BP: 108/67 mmHg Patient Position, if appropriate: Sitting Oxygen Therapy SpO2: 98 % O2 Device: None (Room air) Pain: Pain Assessment Pain Assessment: No/denies pain Locomotion : Wheelchair Mobility Distance: 115   See FIM for current functional status  Therapy/Group: Individual Therapy  Hobble, [name] 03/24/2013, 5:29 PM

## 2013-03-24 NOTE — Progress Notes (Signed)
Speech Language Pathology Daily Session Note  Patient Details  Name: Walter Larsen MRN: [id number] Date of Birth: July 11, 1935  Today's Date: 03/24/2013 Time: 1345-1430 Time Calculation (min): 45 min  Short Term Goals: Week 2: SLP Short Term Goal 1 (Week 2): Pt will sustain attention to basic, structured task for 60 seconds with Max cues SLP Short Term Goal 2 (Week 2): Pt will follow one-step commands with 80% accuracy with Mod cues SLP Short Term Goal 3 (Week 2): Pt will answer basic yes/no questions with 80% accuracy with Max cues SLP Short Term Goal 4 (Week 2): Pt will demonstrate orientation x4 with Max cues SLP Short Term Goal 5 (Week 2): Pt will name common objects with 80% accuracy with Mod cues  Skilled Therapeutic Interventions: Treatment focused on cognitive goals. SLP facilitated session with Max cues for sustained attention during self-feeding task. Pt required only Mod-Max cues to keep his eyes open throughout session. He was oriented to person, but required Max cues for orientation to place. Pt confabulating throughout most of session. Continue plan of care.   FIM:  Comprehension Comprehension Mode: Auditory Comprehension: 1-Understands basic less than 25% of the time/requires cueing 75% of the time Expression Expression Mode: Verbal Expression: 3-Expresses basic 50 - 74% of the time/requires cueing 25 - 50% of the time. Needs to repeat parts of sentences. Social Interaction Social Interaction: 2-Interacts appropriately 25 - 49% of time - Needs frequent redirection. Problem Solving Problem Solving: 1-Solves basic less than 25% of the time - needs direction nearly all the time or does not effectively solve problems and may need a restraint for safety Memory Memory: 1-Recognizes or recalls less than 25% of the time/requires cueing greater than 75% of the time FIM - Eating Eating Activity: 3: Helper scoops food on utensil every scoop  Pain Pain Assessment Pain Assessment:  No/denies pain  Therapy/Group: Individual Therapy   Maxcine Ham, M.A. CCC-SLP [phone]   Maxcine Ham 03/24/2013, 5:13 PM

## 2013-03-24 NOTE — Progress Notes (Signed)
77 y.o. right-handed male with history of hypertension, CAD with PTCA. Admit 03/01/2013 with altered mental status and fever. Recent admission to [facility] with hemorrhagic stroke and discharged 02/27/2013 after a three-day hospital stay. Presented with increased headache and nausea as well as hallucinations and altered mental status. Cranial CT scan showed right temporal occipital parenchymal hematoma measuring up to 7 cm in light of trace intraventricular extension without hydrocephalus. Patient was placed on vancomycin /Zosyn for fever of 100.3 white blood cell count 15,000. An MRI was completed 03/05/2013 during workup of fever rule out abscess that showed a large subacute hematoma right temporoparietal lobe as well small areas of acute infarct in the right occipital parietal lobe and left temporal parietal lobe. Neurosurgery Dr. Danielle Dess consulted advise conservative care in reference of right parietal ICH. TEE completed showing no thrombus without PFO no vegetation. Infectious disease consult and for fever of unknown origin with workup ongoing including CT of the chest 03/08/2013 showed bilateral lobar and segmental pulmonary emboli and bilateral lower extremity Doppler studies consistent with deep vein thrombosis involving the right common femoral vein and right peroneal vein as well as deep vein thrombosis left peroneal vein. Underwent removable Greenfield filter 03/08/2012 per interventional radiology. Depakote was added for headache as well as seizure prevention. Bouts of urinary retention Foley catheter tube inserted x2 Subjective/Complaints:  Son states pt restless last noc.  Discussed sleep issues, elevated WBC and D/C date Review of Systems - poor awareness of deficits  Objective: Vital Signs: Blood pressure 127/70, pulse 89, temperature 98.3 F (36.8 C), temperature source Oral, resp. rate 19, height 5\' 8"  (1.727 m), weight 75.297 kg (166 lb), SpO2 97.00%. No results found. Results  for orders placed during the hospital encounter of 03/15/13 (from the past 72 hour(s))  CBC WITH DIFFERENTIAL     Status: Abnormal   Collection Time    03/23/13  8:00 AM      Result Value Range   WBC 19.2 (*) 4.0 - 10.5 K/uL   RBC 3.50 (*) 4.22 - 5.81 MIL/uL   Hemoglobin 10.8 (*) 13.0 - 17.0 g/dL   HCT 13.2 (*) 44.0 - 10.2 %   MCV 92.6  78.0 - 100.0 fL   MCH 30.9  26.0 - 34.0 pg   MCHC 33.3  30.0 - 36.0 g/dL   RDW 72.5  36.6 - 44.0 %   Platelets 281  150 - 400 K/uL   Neutrophils Relative % 75  43 - 77 %   Neutro Abs 14.4 (*) 1.7 - 7.7 K/uL   Lymphocytes Relative 6 (*) 12 - 46 %   Lymphs Abs 1.2  0.7 - 4.0 K/uL   Monocytes Relative 18 (*) 3 - 12 %   Monocytes Absolute 3.5 (*) 0.1 - 1.0 K/uL   Eosinophils Relative 0  0 - 5 %   Eosinophils Absolute 0.1  0.0 - 0.7 K/uL   Basophils Relative 0  0 - 1 %   Basophils Absolute 0.0  0.0 - 0.1 K/uL     HEENT: normal and oral mucosa moist Cardio: RRR and no murmur Resp: CTA B/L and unlabored GI: BS positive and non tender Extremity:  Pulses positive and No Edema Skin:   Intact Neuro: Awakens to verbal and tactile stim, Cranial Nerve II-XII normal, Abnormal Sensory Difficulty cooperating with sensory exam. Poor attention to task, Normal Motor, Abnormal FMC Ataxic/ dec FMC and Other Able to spell world forward but not backwards Musc/Skel:  Normal General no acute distress  Assessment/Plan: 1. Functional deficits secondary to R temporal/occipital IPH as well as acute infarcts in R occipital/parietal and L temporal/ prieateal which require 3+ hours per day of interdisciplinary therapy in a comprehensive inpatient rehab setting. Physiatrist is providing close team supervision and 24 hour management of active medical problems listed below. Physiatrist and rehab team continue to assess barriers to discharge/monitor patient progress toward functional and medical goals.   FIM: FIM - Bathing Bathing Steps Patient Completed: Chest;Right Arm;Left  Arm;Abdomen;Right upper leg;Left upper leg;Front perineal area;Buttocks Bathing: 4: Min-Patient completes 8-9 50f 10 parts or 75+ percent  FIM - Upper Body Dressing/Undressing Upper body dressing/undressing steps patient completed: Put head through opening of pull over shirt/dress;Pull shirt over trunk Upper body dressing/undressing: 3: Mod-Patient completed 50-74% of tasks FIM - Lower Body Dressing/Undressing Lower body dressing/undressing steps patient completed: Pull underwear up/down;Pull pants up/down;Thread/unthread right underwear leg Lower body dressing/undressing: 3: Mod-Patient completed 50-74% of tasks  FIM - Toileting Toileting steps completed by patient: Adjust clothing prior to toileting Toileting Assistive Devices: Grab bar or rail for support Toileting: 2: Max-Patient completed 1 of 3 steps  FIM - Diplomatic Services operational officer Devices: Elevated toilet seat;Grab bars Toilet Transfers: 2-To toilet/BSC: Max A (lift and lower assist);2-From toilet/BSC: Max A (lift and lower assist)  FIM - Press photographer Assistive Devices: HOB elevated;Bed rails;Arm rests Bed/Chair Transfer: 0: Activity did not occur  FIM - Locomotion: Wheelchair Distance: 80 Locomotion: Wheelchair: 2: Travels 50 - 149 ft with minimal assistance (Pt.>75%) FIM - Locomotion: Ambulation Locomotion: Ambulation Assistive Devices: Designer, industrial/product Ambulation/Gait Assistance: 3: Mod assist Locomotion: Ambulation: 0: Activity did not occur  Comprehension Comprehension Mode: Auditory Comprehension: 2-Understands basic 25 - 49% of the time/requires cueing 51 - 75% of the time  Expression Expression Mode: Verbal Expression: 4-Expresses basic 75 - 89% of the time/requires cueing 10 - 24% of the time. Needs helper to occlude trach/needs to repeat words.  Social Interaction Social Interaction: 2-Interacts appropriately 25 - 49% of time - Needs frequent redirection.  Problem  Solving Problem Solving: 1-Solves basic less than 25% of the time - needs direction nearly all the time or does not effectively solve problems and may need a restraint for safety  Memory Memory: 1-Recognizes or recalls less than 25% of the time/requires cueing greater than 75% of the time  Medical Problem List and Plan:  1. Right temporal parietal intracranial hemorrhage as well as right occipital infarct  2. DVT Prophylaxis/Anticoagulation: Pulmonary emboli/DVT right common femoral vein and peroneal vein, left peroneal vein. 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 Pain Management/headaches. Depakote 750 mg daily  4. Neuropsych: This patient is capable of making decisions on his own behalf.  5. ID. All antibiotics discontinued 03/08/2013 with workup unremarkable except for signs of DVT /PE , no source identified, WBCs increasing , repeat UA C and S , if neg will ask ID to f/u 6. Hypertension. Normotensive off Norvasc 5 mg daily,still taking  Lopressor 50 mg twice a day. Monitor with increased mobility  7. Hyperlipidemia. Lipitor  8. BPH. Flomax.  ---foley removal 12/8, voiding trial monitor hematuria, inc but not fully emptying 9. CAD/PTCA. Aspirin prior to admission discontinued secondary to hemorrhage. No chest pain or shortness of breath  10. Urinary retention. Foley catheter replaced  -UA equivocal, ucx -, dc cipro 11. Insomnia/night time restlessness--increase seroquel tonight 12.  Edema BLE no pain or redness,has bilateral DVTs  albumin is mildly reduced, ECHO 03/07/2013, "  normal EF, no sig valvular disease, hold norvasc, Lasix not helping will d/c, use TEDs   LOS (Days) 9 A FACE TO FACE EVALUATION WAS PERFORMED  Darletta Noblett E 03/24/2013, 6:54 AM

## 2013-03-25 ENCOUNTER — Inpatient Hospital Stay (HOSPITAL_COMMUNITY): Payer: Medicare Other | Admitting: Speech Pathology

## 2013-03-25 ENCOUNTER — Inpatient Hospital Stay (HOSPITAL_COMMUNITY): Payer: Medicare Other | Admitting: *Deleted

## 2013-03-25 ENCOUNTER — Inpatient Hospital Stay (HOSPITAL_COMMUNITY): Payer: Medicare Other | Admitting: Physical Therapy

## 2013-03-25 ENCOUNTER — Inpatient Hospital Stay (HOSPITAL_COMMUNITY): Payer: Medicare Other

## 2013-03-25 LAB — URINE CULTURE
Colony Count: NO GROWTH
Culture: NO GROWTH

## 2013-03-25 NOTE — Progress Notes (Signed)
Physical Therapy Session Note  Patient Details  Name: Walter Larsen MRN: [id number] Date of Birth: 1935-12-28  Today's Date: 03/25/2013 Time: 1105-1200 Time Calculation (min): 55 min  Short Term Goals: Week 2:  PT Short Term Goal 1 (Week 2): STG's=LTG's secondary to ELOS  Skilled Therapeutic Interventions/Progress Updates:    Pt received seated in w/c with wife present. Pt oriented x2 to self and time (date/year). Agreeable to therapy session after mod coaxing. Pt c/o pain on buttocks. Per RN, skin breakdown present on sacrum. Therapist removed elevating w/c leg rests from room to prevent posterior pelvic tilt in w/c, to enable pt to perform pressure relief. Pt performed self-propulsion of w/c with bilat UE's x150' with min A for HOH assist to initiate technique; after ~5' of HOH, pt performed w/c propulsion >150' with supervision, verbal cueing for pt to open eyes, and increased time for self-correction of poor obstacle negotiation (both sides). Once in gym, performed multiple low-pivot transfers from w/c<>mat with min A. Attempted static sitting EOM; however, pt again attempted to reposition self in supine due to sacral pain. Pt refused standing with/without rolling walker. Pt agreeable to standing at table to play checkers; however, performed sit<>stand with min A, static standing for <10 seconds prior to pt repositioning self in w/c. Appears to be fearful with standing. Therapist transported pt to room in w/c secondary to decreased pt participation. Per wife, home has 5 steps to enter with 2 hand rails on one side; 2-3 steps to enter without rails on other side. Therapist suggested considering having ramp built on side with 2-3 steps; wife agreed to consider. Therapist departed with pt seated in w/c with quick release belt in place for safety, wife present, and all needs within reach.  Therapy Documentation Precautions:  Precautions Precautions: Fall Precaution Comments: low blood pressure,  becomes dizzy Restrictions Weight Bearing Restrictions: No Vital Signs: Therapy Vitals Temp: 98 F (36.7 C) Temp src: Axillary Pulse Rate: 84 Resp: 17 BP: 121/74 mmHg Patient Position, if appropriate: Lying Oxygen Therapy SpO2: 96 % O2 Device: None (Room air) Pain: Pain Assessment Faces Pain Scale: Hurts a little bit (transfered pt to bed) Pain Type: Acute pain Pain Location: Sacrum Pain Descriptors / Indicators: Aching Pain Intervention(s): Medication (See eMAR) Locomotion : Wheelchair Mobility Distance: 150   See FIM for current functional status  Therapy/Group: Individual Therapy  Hobble, [name] 03/25/2013, 2:50 PM

## 2013-03-25 NOTE — Progress Notes (Signed)
Speech Language Pathology Daily Session Note  Patient Details  Name: Walter Larsen MRN: [id number] Date of Birth: October 02, 1935  Today's Date: 03/25/2013 Time: 0835-0900 Time Calculation (min): 25 min  Short Term Goals: Week 2: SLP Short Term Goal 1 (Week 2): Pt will sustain attention to basic, structured task for 60 seconds with Max cues SLP Short Term Goal 2 (Week 2): Pt will follow one-step commands with 80% accuracy with Mod cues SLP Short Term Goal 3 (Week 2): Pt will answer basic yes/no questions with 80% accuracy with Max cues SLP Short Term Goal 4 (Week 2): Pt will demonstrate orientation x4 with Max cues SLP Short Term Goal 5 (Week 2): Pt will name common objects with 80% accuracy with Mod cues  Skilled Therapeutic Interventions: Skilled treatment session focused on addressing cognitive goals. SLP facilitated session with Mod cues for topic maintenance during discussion regarding orientation.  While patient continued to require Mod-Max cues to keep his eyes open throughout session, his language and through processes have improved as compared to previous sessions. He was oriented to person, but required Mod cues for orientation to place, time and situation. Continue plan of care.   FIM:  Comprehension Comprehension Mode: Auditory Comprehension: 3-Understands basic 50 - 74% of the time/requires cueing 25 - 50%  of the time Expression Expression Mode: Verbal Expression: 4-Expresses basic 75 - 89% of the time/requires cueing 10 - 24% of the time. Needs helper to occlude trach/needs to repeat words. Social Interaction Social Interaction: 3-Interacts appropriately 50 - 74% of the time - May be physically or verbally inappropriate. Problem Solving Problem Solving: 2-Solves basic 25 - 49% of the time - needs direction more than half the time to initiate, plan or complete simple activities Memory Memory: 2-Recognizes or recalls 25 - 49% of the time/requires cueing 51 - 75% of the  time  Pain Pain Assessment Pain Assessment: No/denies pain  Therapy/Group: Individual Therapy  Charlane Ferretti., CCC-SLP [phone]  Walter Larsen 03/25/2013, 9:10 AM

## 2013-03-25 NOTE — Plan of Care (Signed)
Problem: RH BLADDER ELIMINATION Goal: RH STG MANAGE BLADDER WITH ASSISTANCE STG Manage Bladder With min Assistance with voiding, if home with indwelling catheter min assist for maintaince by patient and/or caregiver  Outcome: Not Progressing Acute urinary retention.

## 2013-03-25 NOTE — Progress Notes (Signed)
77 y.o. right-handed male with history of hypertension, CAD with PTCA. Admit 03/01/2013 with altered mental status and fever. Recent admission to [facility] with hemorrhagic stroke and discharged 02/27/2013 after a three-day hospital stay. Presented with increased headache and nausea as well as hallucinations and altered mental status. Cranial CT scan showed right temporal occipital parenchymal hematoma measuring up to 7 cm in light of trace intraventricular extension without hydrocephalus. Patient was placed on vancomycin /Zosyn for fever of 100.3 white blood cell count 15,000. An MRI was completed 03/05/2013 during workup of fever rule out abscess that showed a large subacute hematoma right temporoparietal lobe as well small areas of acute infarct in the right occipital parietal lobe and left temporal parietal lobe. Neurosurgery Dr. Danielle Dess consulted advise conservative care in reference of right parietal ICH. TEE completed showing no thrombus without PFO no vegetation. Infectious disease consult and for fever of unknown origin with workup ongoing including CT of the chest 03/08/2013 showed bilateral lobar and segmental pulmonary emboli and bilateral lower extremity Doppler studies consistent with deep vein thrombosis involving the right common femoral vein and right peroneal vein as well as deep vein thrombosis left peroneal vein. Underwent removable Greenfield filter 03/08/2012 per interventional radiology. Depakote was added for headache as well as seizure prevention. Bouts of urinary retention Foley catheter tube inserted x2 Subjective/Complaints:  Son states pt restless last noc.  Discussed sleep issues, elevated WBC and D/C date Review of Systems - poor awareness of deficits  Objective: Vital Signs: Blood pressure 116/70, pulse 88, temperature 98.1 F (36.7 C), temperature source Oral, resp. rate 16, height 5\' 8"  (1.727 m), weight 75.297 kg (166 lb), SpO2 96.00%. No results found. Results  for orders placed during the hospital encounter of 03/15/13 (from the past 72 hour(s))  CBC WITH DIFFERENTIAL     Status: Abnormal   Collection Time    03/23/13  8:00 AM      Result Value Range   WBC 19.2 (*) 4.0 - 10.5 K/uL   RBC 3.50 (*) 4.22 - 5.81 MIL/uL   Hemoglobin 10.8 (*) 13.0 - 17.0 g/dL   HCT 16.1 (*) 09.6 - 04.5 %   MCV 92.6  78.0 - 100.0 fL   MCH 30.9  26.0 - 34.0 pg   MCHC 33.3  30.0 - 36.0 g/dL   RDW 40.9  81.1 - 91.4 %   Platelets 281  150 - 400 K/uL   Neutrophils Relative % 75  43 - 77 %   Neutro Abs 14.4 (*) 1.7 - 7.7 K/uL   Lymphocytes Relative 6 (*) 12 - 46 %   Lymphs Abs 1.2  0.7 - 4.0 K/uL   Monocytes Relative 18 (*) 3 - 12 %   Monocytes Absolute 3.5 (*) 0.1 - 1.0 K/uL   Eosinophils Relative 0  0 - 5 %   Eosinophils Absolute 0.1  0.0 - 0.7 K/uL   Basophils Relative 0  0 - 1 %   Basophils Absolute 0.0  0.0 - 0.1 K/uL  URINALYSIS, ROUTINE W REFLEX MICROSCOPIC     Status: Abnormal   Collection Time    03/24/13  1:00 PM      Result Value Range   Color, Urine YELLOW  YELLOW   APPearance CLEAR  CLEAR   Specific Gravity, Urine 1.014  1.005 - 1.030   pH 6.5  5.0 - 8.0   Glucose, UA NEGATIVE  NEGATIVE mg/dL   Hgb urine dipstick SMALL (*) NEGATIVE   Bilirubin Urine NEGATIVE  NEGATIVE   Ketones, ur NEGATIVE  NEGATIVE mg/dL   Protein, ur NEGATIVE  NEGATIVE mg/dL   Urobilinogen, UA 0.2  0.0 - 1.0 mg/dL   Nitrite NEGATIVE  NEGATIVE   Leukocytes, UA NEGATIVE  NEGATIVE  URINE MICROSCOPIC-ADD ON     Status: Abnormal   Collection Time    03/24/13  1:00 PM      Result Value Range   WBC, UA 0-2  <3 WBC/hpf   RBC / HPF 0-2  <3 RBC/hpf   Bacteria, UA FEW (*) RARE     HEENT: normal and oral mucosa moist Cardio: RRR and no murmur Resp: CTA B/L and unlabored GI: BS positive and non tender Extremity:  Pulses positive and No Edema Skin:   Intact Neuro: Awakens to verbal and tactile stim, Cranial Nerve II-XII normal, Abnormal Sensory Difficulty cooperating with sensory  exam. Poor attention to task, Normal Motor, Abnormal FMC Ataxic/ dec FMC and Other Able to spell world forward but not backwards Musc/Skel:  Normal General no acute distress   Assessment/Plan: 1. Functional deficits secondary to R temporal/occipital IPH as well as acute infarcts in R occipital/parietal and L temporal/ prieateal which require 3+ hours per day of interdisciplinary therapy in a comprehensive inpatient rehab setting. Physiatrist is providing close team supervision and 24 hour management of active medical problems listed below. Physiatrist and rehab team continue to assess barriers to discharge/monitor patient progress toward functional and medical goals.   FIM: FIM - Bathing Bathing Steps Patient Completed: Chest;Right Arm;Left Arm;Abdomen;Right upper leg;Left upper leg;Front perineal area;Buttocks Bathing: 4: Min-Patient completes 8-9 44f 10 parts or 75+ percent  FIM - Upper Body Dressing/Undressing Upper body dressing/undressing steps patient completed: Put head through opening of pull over shirt/dress;Pull shirt over trunk;Thread/unthread right sleeve of pullover shirt/dresss Upper body dressing/undressing: 4: Min-Patient completed 75 plus % of tasks FIM - Lower Body Dressing/Undressing Lower body dressing/undressing steps patient completed: Pull underwear up/down;Pull pants up/down;Thread/unthread right underwear leg;Thread/unthread left underwear leg;Thread/unthread left pants leg;Don/Doff left sock Lower body dressing/undressing: 4: Min-Patient completed 75 plus % of tasks  FIM - Toileting Toileting steps completed by patient: Performs perineal hygiene;Adjust clothing prior to toileting Toileting Assistive Devices: Grab bar or rail for support Toileting: 1: Two helpers  FIM - Diplomatic Services operational officer Devices: Elevated toilet seat;Grab bars Toilet Transfers: 3-To toilet/BSC: Mod A (lift or lower assist);3-From toilet/BSC: Mod A (lift or lower  assist)  FIM - Bed/Chair Transfer Bed/Chair Transfer Assistive Devices: Arm rests;HOB elevated Bed/Chair Transfer: 4: Bed > Chair or W/C: Min A (steadying Pt. > 75%);4: Supine > Sit: Min A (steadying Pt. > 75%/lift 1 leg)  FIM - Locomotion: Wheelchair Distance: 115 Locomotion: Wheelchair: 2: Travels 50 - 149 ft with minimal assistance (Pt.>75%) FIM - Locomotion: Ambulation Locomotion: Ambulation Assistive Devices: Designer, industrial/product Ambulation/Gait Assistance: 1: +2 Total assist Locomotion: Ambulation: 1: Two helpers  Comprehension Comprehension Mode: Auditory Comprehension: 1-Understands basic less than 25% of the time/requires cueing 75% of the time  Expression Expression Mode: Verbal Expression: 3-Expresses basic 50 - 74% of the time/requires cueing 25 - 50% of the time. Needs to repeat parts of sentences.  Social Interaction Social Interaction: 2-Interacts appropriately 25 - 49% of time - Needs frequent redirection.  Problem Solving Problem Solving: 1-Solves basic less than 25% of the time - needs direction nearly all the time or does not effectively solve problems and may need a restraint for safety  Memory Memory: 1-Recognizes or recalls less than 25% of the time/requires  cueing greater than 75% of the time  Medical Problem List and Plan:  1. Right temporal parietal intracranial hemorrhage as well as right occipital infarct  2. DVT Prophylaxis/Anticoagulation: Pulmonary emboli/DVT right common femoral vein and peroneal vein, left peroneal vein. 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 Pain Management/headaches. Depakote 750 mg daily  4. Neuropsych: This patient is capable of making decisions on his own behalf.  5. ID. All antibiotics discontinued 03/08/2013 with workup unremarkable except for signs of DVT /PE , no source identified, WBCs increasing , repeat UA C and S ,will ask ID to f/u 6. Hypertension. Normotensive off  Norvasc 5 mg daily,still taking  Lopressor 50 mg twice a day. Monitor with increased mobility  7. Hyperlipidemia. Lipitor  8. BPH. Flomax.  ---foley removal 12/8, voiding trial monitor hematuria, inc but not fully emptying 9. CAD/PTCA. Aspirin prior to admission discontinued secondary to hemorrhage. No chest pain or shortness of breath  10. Urinary retention. Foley catheter replaced  -UA negative 11. Insomnia/night time restlessness--cont seroquel tonight 12.  Edema BLE no pain or redness,has bilateral DVTs  albumin is mildly reduced, ECHO 03/07/2013, "normal EF, no sig valvular disease, hold norvasc, Lasix not helping will d/c, use TEDs   LOS (Days) 10 A FACE TO FACE EVALUATION WAS PERFORMED  KIRSTEINS,ANDREW E 03/25/2013, 6:55 AM

## 2013-03-25 NOTE — Progress Notes (Signed)
Occupational Therapy Note Patient Details  Name: Walter Larsen MRN: [id number] Date of Birth: December 20, 1935 Today's Date: 03/25/2013  Time:1500-1545 Pain:none Individual session  Treatment focused on transfers from bed to wc and wc to toilet.  Pt. Was minimal assist with supine to EOB.   Bp= 130/70 supine:   sitting= 110/75.  Transferred from EOB to wc with mod assist.  Transferred from wc to 3n1 over toilet with mod assist.  Pt unable to have BM.  Pt able to wipe self and then transfer back to wc.  Left in room with safety belt on and wife present.     Walter Larsen 03/25/2013, 12:56 PM

## 2013-03-25 NOTE — Progress Notes (Signed)
Occupational Therapy Session Note  Patient Details  Name: Walter Larsen MRN: [id number] Date of Birth: 10-04-1935  Today's Date: 03/25/2013 Time: 4098-1191 Time Calculation (min): 60 min  Short Term Goals: Week 2:  OT Short Term Goal 1 (Week 2): Pt demonstrate sustained attention to self-care task without cues OT Short Term Goal 2 (Week 2): Pt will consistently don shirt with min assist OT Short Term Goal 3 (Week 2): Pt will complete toilet transfer with min assist OT Short Term Goal 4 (Week 2): Pt remain alert with eyes open appox 25% of therapy session with mod cues  Skilled Therapeutic Interventions/Progress Updates:    Pt seen for ADL retraining with focus on functional transfers, attention to task, cognitive skills, and standing balance. Completed bathing and dressing with min assist and mod-max assist for sequencing and orienting clothing. Pt attending to left side and initiating threading LUE/LLE without cues. Pt required frequent rest breaks throughout session. Pt exhibits confabulation throughout session with brief periods of mental clarity and appropriate conversation. Pt required min assist for sit<>stand and standing balance with mod cues for hand placement. Pt's BP stable throughout session. Pt left sitting in w/c with all needs in reach and wife present.   Therapy Documentation Precautions:  Precautions Precautions: Fall Precaution Comments: low blood pressure, becomes dizzy Restrictions Weight Bearing Restrictions: No General:   Vital Signs:   Pain: Pt reports pain at sacral area and BLE. RN notified.   See FIM for current functional status  Therapy/Group: Individual Therapy  Daneil Dan 03/25/2013, 11:54 AM

## 2013-03-26 ENCOUNTER — Inpatient Hospital Stay (HOSPITAL_COMMUNITY): Payer: Medicare Other | Admitting: *Deleted

## 2013-03-26 LAB — CBC WITH DIFFERENTIAL/PLATELET
Basophils Absolute: 0.1 10*3/uL (ref 0.0–0.1)
Basophils Relative: 1 % (ref 0–1)
Eosinophils Absolute: 0.1 10*3/uL (ref 0.0–0.7)
Eosinophils Relative: 1 % (ref 0–5)
HCT: 30.1 % — ABNORMAL LOW (ref 39.0–52.0)
Hemoglobin: 10.2 g/dL — ABNORMAL LOW (ref 13.0–17.0)
Lymphocytes Relative: 7 % — ABNORMAL LOW (ref 12–46)
Lymphs Abs: 1 10*3/uL (ref 0.7–4.0)
MCH: 30.9 pg (ref 26.0–34.0)
MCHC: 33.9 g/dL (ref 30.0–36.0)
MCV: 91.2 fL (ref 78.0–100.0)
Monocytes Absolute: 2.2 10*3/uL — ABNORMAL HIGH (ref 0.1–1.0)
Monocytes Relative: 15 % — ABNORMAL HIGH (ref 3–12)
Neutro Abs: 11.3 10*3/uL — ABNORMAL HIGH (ref 1.7–7.7)
Neutrophils Relative %: 76 % (ref 43–77)
Platelets: 334 10*3/uL (ref 150–400)
RBC: 3.3 MIL/uL — ABNORMAL LOW (ref 4.22–5.81)
RDW: 14.4 % (ref 11.5–15.5)
WBC: 14.7 10*3/uL — ABNORMAL HIGH (ref 4.0–10.5)

## 2013-03-26 LAB — COMPREHENSIVE METABOLIC PANEL
ALT: 19 U/L (ref 0–53)
AST: 43 U/L — ABNORMAL HIGH (ref 0–37)
Albumin: 1.9 g/dL — ABNORMAL LOW (ref 3.5–5.2)
Alkaline Phosphatase: 148 U/L — ABNORMAL HIGH (ref 39–117)
BUN: 29 mg/dL — ABNORMAL HIGH (ref 6–23)
CO2: 26 mEq/L (ref 19–32)
Calcium: 8.4 mg/dL (ref 8.4–10.5)
Chloride: 96 mEq/L (ref 96–112)
Creatinine, Ser: 0.98 mg/dL (ref 0.50–1.35)
GFR calc Af Amer: >90 mL/min (ref >90)
GFR calc non Af Amer: 79 mL/min — ABNORMAL LOW (ref >90)
Glucose, Bld: 114 mg/dL — ABNORMAL HIGH (ref 70–99)
Potassium: 4.5 mEq/L (ref 3.5–5.1)
Sodium: 132 mEq/L — ABNORMAL LOW (ref 135–145)
Total Bilirubin: 0.6 mg/dL (ref 0.3–1.2)
Total Protein: 6.6 g/dL (ref 6.0–8.3)

## 2013-03-26 NOTE — Progress Notes (Signed)
Occupational Therapy Note Patient Details  Name: Walter Larsen MRN: [id number] Date of Birth: 1935-06-29 Today's Date: 03/26/2013 Time:1615-1655  (40 min) Pain:none Individual session  Engaged in bed mobility, sitting balance, visual scanning and looking to left, cognition.  Pt. Lying in bed.  Wife present.  Went from supine to left side with minimal instructional cues for organizing and sequencing.  Side lying to sit with min assist.  Sat on EOB. With supervision.  Did visual scanning and finding objects on left.  Transferred from bed to wc with minimal assist stand pivot.  Set up pt with dinner tray.  Applied safety belt and placed call bell within reach.   Wife encouraging pt.to eat.     Humberto Seals 03/26/2013, 4:58 PM

## 2013-03-26 NOTE — Plan of Care (Signed)
Problem: RH BLADDER ELIMINATION Goal: RH STG MANAGE BLADDER WITH ASSISTANCE STG Manage Bladder With min Assistance with voiding, if home with indwelling catheter min assist for maintaince by patient and/or caregiver  Outcome: Not Progressing Acute urinary retention but able to urinate in the urinal twice.  Problem: RH SKIN INTEGRITY Goal: RH STG SKIN FREE OF INFECTION/BREAKDOWN Skin free of infection and no new skin breakdown with min assist while in Rehab  Outcome: Not Progressing Slit to sacrum with barrier cream and allevyn dressing.

## 2013-03-26 NOTE — Progress Notes (Signed)
77 y.o. right-handed male with history of hypertension, CAD with PTCA. Admit 03/01/2013 with altered mental status and fever. Recent admission to [facility] with hemorrhagic stroke and discharged 02/27/2013 after a three-day hospital stay. Presented with increased headache and nausea as well as hallucinations and altered mental status. Cranial CT scan showed right temporal occipital parenchymal hematoma measuring up to 7 cm in light of trace intraventricular extension without hydrocephalus. Patient was placed on vancomycin /Zosyn for fever of 100.3 white blood cell count 15,000. An MRI was completed 03/05/2013 during workup of fever rule out abscess that showed a large subacute hematoma right temporoparietal lobe as well small areas of acute infarct in the right occipital parietal lobe and left temporal parietal lobe. Neurosurgery Dr. Danielle Dess consulted advise conservative care in reference of right parietal ICH. TEE completed showing no thrombus without PFO no vegetation. Infectious disease consult and for fever of unknown origin with workup ongoing including CT of the chest 03/08/2013 showed bilateral lobar and segmental pulmonary emboli and bilateral lower extremity Doppler studies consistent with deep vein thrombosis involving the right common femoral vein and right peroneal vein as well as deep vein thrombosis left peroneal vein. Underwent removable Greenfield filter 03/08/2012 per interventional radiology. Depakote was added for headache as well as seizure prevention. Bouts of urinary retention Foley catheter tube inserted x2 Subjective/Complaints: Slept well per grand daughter Review of Systems - poor awareness of deficits  Objective: Vital Signs: Blood pressure 129/80, pulse 87, temperature 98 F (36.7 C), temperature source Axillary, resp. rate 18, height 5\' 8"  (1.727 m), weight 75.297 kg (166 lb), SpO2 99.00%. No results found. Results for orders placed during the hospital encounter of  03/15/13 (from the past 72 hour(s))  CBC WITH DIFFERENTIAL     Status: Abnormal   Collection Time    03/23/13  8:00 AM      Result Value Range   WBC 19.2 (*) 4.0 - 10.5 K/uL   RBC 3.50 (*) 4.22 - 5.81 MIL/uL   Hemoglobin 10.8 (*) 13.0 - 17.0 g/dL   HCT 16.1 (*) 09.6 - 04.5 %   MCV 92.6  78.0 - 100.0 fL   MCH 30.9  26.0 - 34.0 pg   MCHC 33.3  30.0 - 36.0 g/dL   RDW 40.9  81.1 - 91.4 %   Platelets 281  150 - 400 K/uL   Neutrophils Relative % 75  43 - 77 %   Neutro Abs 14.4 (*) 1.7 - 7.7 K/uL   Lymphocytes Relative 6 (*) 12 - 46 %   Lymphs Abs 1.2  0.7 - 4.0 K/uL   Monocytes Relative 18 (*) 3 - 12 %   Monocytes Absolute 3.5 (*) 0.1 - 1.0 K/uL   Eosinophils Relative 0  0 - 5 %   Eosinophils Absolute 0.1  0.0 - 0.7 K/uL   Basophils Relative 0  0 - 1 %   Basophils Absolute 0.0  0.0 - 0.1 K/uL  URINALYSIS, ROUTINE W REFLEX MICROSCOPIC     Status: Abnormal   Collection Time    03/24/13  1:00 PM      Result Value Range   Color, Urine YELLOW  YELLOW   APPearance CLEAR  CLEAR   Specific Gravity, Urine 1.014  1.005 - 1.030   pH 6.5  5.0 - 8.0   Glucose, UA NEGATIVE  NEGATIVE mg/dL   Hgb urine dipstick SMALL (*) NEGATIVE   Bilirubin Urine NEGATIVE  NEGATIVE   Ketones, ur NEGATIVE  NEGATIVE mg/dL  Protein, ur NEGATIVE  NEGATIVE mg/dL   Urobilinogen, UA 0.2  0.0 - 1.0 mg/dL   Nitrite NEGATIVE  NEGATIVE   Leukocytes, UA NEGATIVE  NEGATIVE  URINE CULTURE     Status: None   Collection Time    03/24/13  1:00 PM      Result Value Range   Specimen Description URINE, CATHETERIZED     Special Requests NONE     Culture  Setup Time       Value: 03/24/2013 17:22     Performed at [facility] Count       Value: NO GROWTH     Performed at [facility]   Culture       Value: NO GROWTH     Performed at [facility]   Report Status 03/25/2013 FINAL    URINE MICROSCOPIC-ADD ON     Status: Abnormal   Collection Time    03/24/13  1:00 PM      Result Value  Range   WBC, UA 0-2  <3 WBC/hpf   RBC / HPF 0-2  <3 RBC/hpf   Bacteria, UA FEW (*) RARE     HEENT: normal and oral mucosa moist Cardio: RRR and no murmur Resp: CTA B/L and unlabored GI: BS positive and non tender Extremity:  Pulses positive and No Edema Skin:  Gr 2-3 decub sacrum Neuro: Awakens to verbal and tactile stim, Cranial Nerve II-XII normal, Abnormal Sensory Difficulty cooperating with sensory exam. Poor attention to task, Normal Motor, Abnormal FMC Ataxic/ dec FMC and Other Able to spell world forward but not backwards Musc/Skel:  Normal General no acute distress   Assessment/Plan: 1. Functional deficits secondary to R temporal/occipital IPH as well as acute infarcts in R occipital/parietal and L temporal/ prieateal which require 3+ hours per day of interdisciplinary therapy in a comprehensive inpatient rehab setting. Physiatrist is providing close team supervision and 24 hour management of active medical problems listed below. Physiatrist and rehab team continue to assess barriers to discharge/monitor patient progress toward functional and medical goals.   FIM: FIM - Bathing Bathing Steps Patient Completed: Chest;Right Arm;Left Arm;Abdomen;Right upper leg;Left upper leg;Front perineal area;Buttocks;Left lower leg (including foot) Bathing: 4: Min-Patient completes 8-9 47f 10 parts or 75+ percent  FIM - Upper Body Dressing/Undressing Upper body dressing/undressing steps patient completed: Put head through opening of pull over shirt/dress;Pull shirt over trunk;Thread/unthread right sleeve of pullover shirt/dresss;Thread/unthread left sleeve of pullover shirt/dress Upper body dressing/undressing: 5: Set-up assist to: Obtain clothing/put away FIM - Lower Body Dressing/Undressing Lower body dressing/undressing steps patient completed: Pull underwear up/down;Pull pants up/down;Thread/unthread right underwear leg;Thread/unthread left underwear leg;Thread/unthread left pants  leg;Don/Doff left sock Lower body dressing/undressing: 4: Min-Patient completed 75 plus % of tasks  FIM - Toileting Toileting steps completed by patient: Adjust clothing prior to toileting;Performs perineal hygiene;Adjust clothing after toileting Toileting Assistive Devices: Grab bar or rail for support Toileting: 3: Mod-Patient completed 2 of 3 steps  FIM - Diplomatic Services operational officer Devices: Elevated toilet seat;Grab bars Toilet Transfers: 3-To toilet/BSC: Mod A (lift or lower assist);3-From toilet/BSC: Mod A (lift or lower assist)  FIM - Bed/Chair Transfer Bed/Chair Transfer Assistive Devices: Bed rails Bed/Chair Transfer: 5: Supine > Sit: Supervision (verbal cues/safety issues);3: Bed > Chair or W/C: Mod A (lift or lower assist)  FIM - Locomotion: Wheelchair Distance: 150 Locomotion: Wheelchair: 4: Travels 150 ft or more: maneuvers on rugs and over door sillls with minimal assistance (Pt.>75%) FIM - Locomotion:  Ambulation Locomotion: Ambulation Assistive Devices: Designer, industrial/product Ambulation/Gait Assistance: 1: +2 Total assist Locomotion: Ambulation: 1: Two helpers  Comprehension Comprehension Mode: Auditory Comprehension: 3-Understands basic 50 - 74% of the time/requires cueing 25 - 50%  of the time  Expression Expression Mode: Verbal Expression: 3-Expresses basic 50 - 74% of the time/requires cueing 25 - 50% of the time. Needs to repeat parts of sentences.  Social Interaction Social Interaction: 4-Interacts appropriately 75 - 89% of the time - Needs redirection for appropriate language or to initiate interaction.  Problem Solving Problem Solving: 2-Solves basic 25 - 49% of the time - needs direction more than half the time to initiate, plan or complete simple activities  Memory Memory: 1-Recognizes or recalls less than 25% of the time/requires cueing greater than 75% of the time  Medical Problem List and Plan:  1. Right temporal parietal intracranial  hemorrhage as well as right occipital infarct  2. DVT Prophylaxis/Anticoagulation: Pulmonary emboli/DVT right common femoral vein and peroneal vein, left peroneal vein. 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 Pain Management/headaches. Depakote 750 mg daily  4. Neuropsych: This patient is capable of making decisions on his own behalf.  5. ID. All antibiotics discontinued 03/08/2013 with workup unremarkable except for signs of DVT /PE , no source identified, WBCs increasing but afeb, repeat UA C and S neg, repeat CBC may need ID f/u 6. Hypertension. Normotensive off Norvasc 5 mg daily,still taking  Lopressor 50 mg twice a day. Monitor with increased mobility  7. Hyperlipidemia. Lipitor  8. BPH. Flomax.  ---foley removal 12/8, voiding trial still requiring caths, occ void 9. CAD/PTCA. Aspirin prior to admission discontinued secondary to hemorrhage. No chest pain or shortness of breath  10. Urinary retention. Foley catheter replaced  -UA negative 11. Insomnia/night time restlessness--reduce seroquel tonight 25-50mg  12.  Edema BLE no pain or redness,has bilateral DVTs and IVC filter  albumin is mildly reduced, ECHO 03/07/2013, "normal EF, no sig valvular disease, hold norvasc, Lasix not helping will d/c, use TEDs 13.  Sacral decub, sidelying , Foam dressing, add air mattress  LOS (Days) 11 A FACE TO FACE EVALUATION WAS PERFORMED  Walter Larsen E 03/26/2013, 6:25 AM

## 2013-03-27 ENCOUNTER — Inpatient Hospital Stay (HOSPITAL_COMMUNITY): Payer: Medicare Other | Admitting: *Deleted

## 2013-03-27 NOTE — Progress Notes (Signed)
Physical Therapy Session Note  Patient Details  Name: Walter Larsen MRN: [id number] Date of Birth: [birth year]  Today's Date: 03/27/2013 Time: 0723-0816 Time Calculation (min): 53 min  Skilled Therapeutic Interventions/Progress Updates:  Patient in bed , received breakfest just before scheduled PT session, does not want to sit up on EOB to eat, asked for few minutes to fininsh eating. Patient appears confused and has difficulty with sequencing when eating. Dressing in bed ,mo to max A for lower body dressing,transfer to w/c with min A and increased need for verbal cues due to lack of focus. W/c propulsion training to [employer] with multiple rest breaks and max verbal cues for participation and to follow path, patient continues to close his eyes. Nu Step 7 min with multiple rest periods due to fatigue and poor participation, level 3. Transfer training w/c<=>stand<=>mat with min A. Gait training with 2 wheel walker 1 x 5 feet 1x 12 feet with min A and constant cues for sequencing and posture. Patient complained of pain in L LE, not able to describe pain or level,nursing notified.  Patient stated multiple times through the session that he is done, and he wants to lay down,son was present during session and assisted in encouraging patient to continue therapy.  Patient returned to bed, alarm on and all needs within reach.  Therapy Documentation Precautions:  Precautions Precautions: Fall Precaution Comments: low blood pressure, becomes dizzy Restrictions Weight Bearing Restrictions: No Vital Signs:    See FIM for current functional status  Therapy/Group: Individual Therapy  Dorna Mai 03/27/2013, 10:43 AM

## 2013-03-27 NOTE — Progress Notes (Signed)
77 y.o. right-handed male with history of hypertension, CAD with PTCA. Admit 03/01/2013 with altered mental status and fever. Recent admission to [facility] with hemorrhagic stroke and discharged 02/27/2013 after a three-day hospital stay. Presented with increased headache and nausea as well as hallucinations and altered mental status. Cranial CT scan showed right temporal occipital parenchymal hematoma measuring up to 7 cm in light of trace intraventricular extension without hydrocephalus. Patient was placed on vancomycin /Zosyn for fever of 100.3 white blood cell count 15,000. An MRI was completed 03/05/2013 during workup of fever rule out abscess that showed a large subacute hematoma right temporoparietal lobe as well small areas of acute infarct in the right occipital parietal lobe and left temporal parietal lobe. Neurosurgery Dr. Danielle Dess consulted advise conservative care in reference of right parietal ICH. TEE completed showing no thrombus without PFO no vegetation. Infectious disease consult and for fever of unknown origin with workup ongoing including CT of the chest 03/08/2013 showed bilateral lobar and segmental pulmonary emboli and bilateral lower extremity Doppler studies consistent with deep vein thrombosis involving the right common femoral vein and right peroneal vein as well as deep vein thrombosis left peroneal vein. Underwent removable Greenfield filter 03/08/2012 per interventional radiology. Depakote was added for headache as well as seizure prevention. Bouts of urinary retention Foley catheter tube inserted x2 Subjective/Complaints: Slept well per son first half of the night but restless for remainder, pt opening eyes more today Review of Systems - poor awareness of deficits  Objective: Vital Signs: Blood pressure 103/58, pulse 82, temperature 99.2 F (37.3 C), temperature source Oral, resp. rate 17, height 5\' 8"  (1.727 m), weight 75.297 kg (166 lb), SpO2 95.00%. No results  found. Results for orders placed during the hospital encounter of 03/15/13 (from the past 72 hour(s))  URINALYSIS, ROUTINE W REFLEX MICROSCOPIC     Status: Abnormal   Collection Time    03/24/13  1:00 PM      Result Value Range   Color, Urine YELLOW  YELLOW   APPearance CLEAR  CLEAR   Specific Gravity, Urine 1.014  1.005 - 1.030   pH 6.5  5.0 - 8.0   Glucose, UA NEGATIVE  NEGATIVE mg/dL   Hgb urine dipstick SMALL (*) NEGATIVE   Bilirubin Urine NEGATIVE  NEGATIVE   Ketones, ur NEGATIVE  NEGATIVE mg/dL   Protein, ur NEGATIVE  NEGATIVE mg/dL   Urobilinogen, UA 0.2  0.0 - 1.0 mg/dL   Nitrite NEGATIVE  NEGATIVE   Leukocytes, UA NEGATIVE  NEGATIVE  URINE CULTURE     Status: None   Collection Time    03/24/13  1:00 PM      Result Value Range   Specimen Description URINE, CATHETERIZED     Special Requests NONE     Culture  Setup Time       Value: 03/24/2013 17:22     Performed at [facility] Count       Value: NO GROWTH     Performed at [facility]   Culture       Value: NO GROWTH     Performed at [facility]   Report Status 03/25/2013 FINAL    URINE MICROSCOPIC-ADD ON     Status: Abnormal   Collection Time    03/24/13  1:00 PM      Result Value Range   WBC, UA 0-2  <3 WBC/hpf   RBC / HPF 0-2  <3 RBC/hpf   Bacteria, UA  FEW (*) RARE  CBC WITH DIFFERENTIAL     Status: Abnormal   Collection Time    03/26/13  8:55 AM      Result Value Range   WBC 14.7 (*) 4.0 - 10.5 K/uL   RBC 3.30 (*) 4.22 - 5.81 MIL/uL   Hemoglobin 10.2 (*) 13.0 - 17.0 g/dL   HCT 45.4 (*) 09.8 - 11.9 %   MCV 91.2  78.0 - 100.0 fL   MCH 30.9  26.0 - 34.0 pg   MCHC 33.9  30.0 - 36.0 g/dL   RDW 14.7  82.9 - 56.2 %   Platelets 334  150 - 400 K/uL   Neutrophils Relative % 76  43 - 77 %   Lymphocytes Relative 7 (*) 12 - 46 %   Monocytes Relative 15 (*) 3 - 12 %   Eosinophils Relative 1  0 - 5 %   Basophils Relative 1  0 - 1 %   Neutro Abs 11.3 (*) 1.7 - 7.7 K/uL   Lymphs  Abs 1.0  0.7 - 4.0 K/uL   Monocytes Absolute 2.2 (*) 0.1 - 1.0 K/uL   Eosinophils Absolute 0.1  0.0 - 0.7 K/uL   Basophils Absolute 0.1  0.0 - 0.1 K/uL   RBC Morphology POLYCHROMASIA PRESENT     WBC Morphology MILD LEFT SHIFT (1-5% METAS, OCC MYELO, OCC BANDS)    COMPREHENSIVE METABOLIC PANEL     Status: Abnormal   Collection Time    03/26/13  8:55 AM      Result Value Range   Sodium 132 (*) 135 - 145 mEq/L   Potassium 4.5  3.5 - 5.1 mEq/L   Chloride 96  96 - 112 mEq/L   CO2 26  19 - 32 mEq/L   Glucose, Bld 114 (*) 70 - 99 mg/dL   BUN 29 (*) 6 - 23 mg/dL   Creatinine, Ser 1.30  0.50 - 1.35 mg/dL   Calcium 8.4  8.4 - 86.5 mg/dL   Total Protein 6.6  6.0 - 8.3 g/dL   Albumin 1.9 (*) 3.5 - 5.2 g/dL   AST 43 (*) 0 - 37 U/L   ALT 19  0 - 53 U/L   Alkaline Phosphatase 148 (*) 39 - 117 U/L   Total Bilirubin 0.6  0.3 - 1.2 mg/dL   GFR calc non Af Amer 79 (*) >90 mL/min   GFR calc Af Amer >90  >90 mL/min   Comment: (NOTE)     The eGFR has been calculated using the CKD EPI equation.     This calculation has not been validated in all clinical situations.     eGFR's persistently <90 mL/min signify possible Chronic Kidney     Disease.     HEENT: normal and oral mucosa moist Cardio: RRR and no murmur Resp: CTA B/L and unlabored GI: BS positive and non tender Extremity:  Pulses positive and No Edema Skin:  Gr 2-3 decub sacrum Neuro: Awakens to verbal and tactile stim, Cranial Nerve II-XII normal, Abnormal Sensory Difficulty cooperating with sensory exam. Poor attention to task, Normal Motor, Abnormal FMC Ataxic/ dec FMC and Other Able to spell world forward but not backwards Musc/Skel:  Normal General no acute distress   Assessment/Plan: 1. Functional deficits secondary to R temporal/occipital IPH as well as acute infarcts in R occipital/parietal and L temporal/ prieateal which require 3+ hours per day of interdisciplinary therapy in a comprehensive inpatient rehab setting. Physiatrist  is providing close team supervision and 24  hour management of active medical problems listed below. Physiatrist and rehab team continue to assess barriers to discharge/monitor patient progress toward functional and medical goals.   FIM: FIM - Bathing Bathing Steps Patient Completed: Chest;Right Arm;Left Arm;Abdomen;Front perineal area Bathing: 4: Min-Patient completes 8-9 43f 10 parts or 75+ percent  FIM - Upper Body Dressing/Undressing Upper body dressing/undressing steps patient completed: Put head through opening of pull over shirt/dress;Pull shirt over trunk;Thread/unthread right sleeve of pullover shirt/dresss;Thread/unthread left sleeve of pullover shirt/dress Upper body dressing/undressing: 5: Set-up assist to: Obtain clothing/put away FIM - Lower Body Dressing/Undressing Lower body dressing/undressing steps patient completed: Pull underwear up/down;Pull pants up/down;Thread/unthread right underwear leg;Thread/unthread left underwear leg;Thread/unthread left pants leg;Don/Doff left sock Lower body dressing/undressing: 4: Min-Patient completed 75 plus % of tasks  FIM - Toileting Toileting steps completed by patient: Adjust clothing prior to toileting;Performs perineal hygiene Toileting Assistive Devices: Grab bar or rail for support Toileting: 3: Mod-Patient completed 2 of 3 steps  FIM - Diplomatic Services operational officer Devices: Elevated toilet seat;Grab bars Toilet Transfers: 3-To toilet/BSC: Mod A (lift or lower assist)  FIM - Banker Devices: Arm rests;Walker Bed/Chair Transfer: 5: Supine > Sit: Supervision (verbal cues/safety issues);3: Bed > Chair or W/C: Mod A (lift or lower assist);3: Chair or W/C > Bed: Mod A (lift or lower assist)  FIM - Locomotion: Wheelchair Distance: 150 Locomotion: Wheelchair: 4: Travels 150 ft or more: maneuvers on rugs and over door sillls with minimal assistance (Pt.>75%) FIM - Locomotion:  Ambulation Locomotion: Ambulation Assistive Devices: Designer, industrial/product Ambulation/Gait Assistance: 1: +2 Total assist Locomotion: Ambulation: 1: Two helpers  Comprehension Comprehension Mode: Auditory Comprehension: 2-Understands basic 25 - 49% of the time/requires cueing 51 - 75% of the time  Expression Expression Mode: Verbal Expression: 2-Expresses basic 25 - 49% of the time/requires cueing 50 - 75% of the time. Uses single words/gestures.  Social Interaction Social Interaction: 2-Interacts appropriately 25 - 49% of time - Needs frequent redirection.  Problem Solving Problem Solving: 2-Solves basic 25 - 49% of the time - needs direction more than half the time to initiate, plan or complete simple activities  Memory Memory: 1-Recognizes or recalls less than 25% of the time/requires cueing greater than 75% of the time  Medical Problem List and Plan:  1. Right temporal parietal intracranial hemorrhage as well as right occipital infarct  2. DVT Prophylaxis/Anticoagulation: Pulmonary emboli/DVT right common femoral vein and peroneal vein, left peroneal vein. 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 Pain Management/headaches. Depakote 750 mg daily  4. Neuropsych: This patient is capable of making decisions on his own behalf.  5. ID. All antibiotics discontinued 03/08/2013 with workup unremarkable except for signs of DVT /PE , no source identified, WBCs decreasing afeb, repeat UA C and S neg,  Discussed with ID consultant who rec cont monitoring off acetaminophen 6. Hypertension. Normotensive off Norvasc 5 mg daily,still taking  Lopressor 50 mg twice a day. Monitor with increased mobility  7. Hyperlipidemia. Lipitor  8. BPH. Flomax.  ---foley removal 12/8, voiding trial still requiring caths, occ void 9. CAD/PTCA. Aspirin prior to admission discontinued secondary to hemorrhage. No chest pain or shortness of breath  10. Urinary retention. Foley catheter  replaced  -UA negative 11. Insomnia/night time restlessness--reduce seroquel tonight 25-50mg , more alert this am 12.  Edema BLE no pain or redness,has bilateral DVTs and IVC filter  albumin is mildly reduced, ECHO 03/07/2013, "normal EF, no sig valvular disease, hold norvasc,  Lasix not helping will d/c, use TEDs 13.  Sacral decub, sidelying , Foam dressing, add air mattress  LOS (Days) 12 A FACE TO FACE EVALUATION WAS PERFORMED  Shandee Jergens E 03/27/2013, 6:47 AM

## 2013-03-27 NOTE — Progress Notes (Signed)
Pt unable to void and was I&O cathed at 0845 for 500cc. At 1400 reported urge to urinate and was assisted to toilet, unable to void; I&O cath at that time for 450cc. At 1615 pt voided 75cc in urinal and PVR was 204cc, continue to monitor. Mick Sell RN

## 2013-03-28 ENCOUNTER — Inpatient Hospital Stay (HOSPITAL_COMMUNITY): Payer: Medicare Other

## 2013-03-28 ENCOUNTER — Encounter (HOSPITAL_COMMUNITY): Payer: Medicare Other

## 2013-03-28 ENCOUNTER — Inpatient Hospital Stay (HOSPITAL_COMMUNITY): Payer: Medicare Other | Admitting: Physical Therapy

## 2013-03-28 ENCOUNTER — Inpatient Hospital Stay (HOSPITAL_COMMUNITY): Payer: Medicare Other | Admitting: Speech Pathology

## 2013-03-28 DIAGNOSIS — I2510 Atherosclerotic heart disease of native coronary artery without angina pectoris: Secondary | ICD-10-CM

## 2013-03-28 DIAGNOSIS — I10 Essential (primary) hypertension: Secondary | ICD-10-CM

## 2013-03-28 DIAGNOSIS — I69998 Other sequelae following unspecified cerebrovascular disease: Secondary | ICD-10-CM

## 2013-03-28 DIAGNOSIS — I619 Nontraumatic intracerebral hemorrhage, unspecified: Secondary | ICD-10-CM

## 2013-03-28 DIAGNOSIS — R209 Unspecified disturbances of skin sensation: Secondary | ICD-10-CM

## 2013-03-28 MED ORDER — LIDOCAINE HCL 2 % EX GEL
CUTANEOUS | Status: DC | PRN
  Filled 2013-03-28 (×5): qty 5

## 2013-03-28 NOTE — Progress Notes (Signed)
Speech Language Pathology Daily Session Note  Patient Details  Name: Walter Larsen MRN: [id number] Date of Birth: July 27, 1935  Today's Date: 03/28/2013 Time: 4098-1191 Time Calculation (min): 40 min  Short Term Goals: Week 2: SLP Short Term Goal 1 (Week 2): Pt will sustain attention to basic, structured task for 60 seconds with Max cues SLP Short Term Goal 2 (Week 2): Pt will follow one-step commands with 80% accuracy with Mod cues SLP Short Term Goal 3 (Week 2): Pt will answer basic yes/no questions with 80% accuracy with Max cues SLP Short Term Goal 4 (Week 2): Pt will demonstrate orientation x4 with Max cues SLP Short Term Goal 5 (Week 2): Pt will name common objects with 80% accuracy with Mod cues  Skilled Therapeutic Interventions: Skilled treatment session focused on addressing cognitive goals. SLP facilitated session with picture cards with 4 items.  Patient required Max cues to hold card right side up, Mod cues to label pictured items and Max cues to identify which object was different.  SLP also facilitated session with toileting transfers and self-care with Min-Mod physical assist and verbal cues for sequencing.     FIM:  Comprehension Comprehension Mode: Auditory Comprehension: 3-Understands basic 50 - 74% of the time/requires cueing 25 - 50%  of the time Expression Expression Mode: Verbal Expression: 3-Expresses basic 50 - 74% of the time/requires cueing 25 - 50% of the time. Needs to repeat parts of sentences. Social Interaction Social Interaction: 3-Interacts appropriately 50 - 74% of the time - May be physically or verbally inappropriate. Problem Solving Problem Solving: 2-Solves basic 25 - 49% of the time - needs direction more than half the time to initiate, plan or complete simple activities Memory Memory: 1-Recognizes or recalls less than 25% of the time/requires cueing greater than 75% of the time  Pain Pain Assessment Pain Assessment: No/denies  pain  Therapy/Group: Individual Therapy  Charlane Ferretti., CCC-SLP [phone]  Walter Larsen 03/28/2013, 4:08 PM

## 2013-03-28 NOTE — Progress Notes (Signed)
Occupational Therapy Session Note  Patient Details  Name: Walter Larsen MRN: [id number] Date of Birth: 06-Jul-1935  Today's Date: 03/28/2013 Time: 0730-0828 Time Calculation (min): 58 min  Short Term Goals: Week 2:  OT Short Term Goal 1 (Week 2): Pt demonstrate sustained attention to self-care task without cues OT Short Term Goal 2 (Week 2): Pt will consistently don shirt with min assist OT Short Term Goal 3 (Week 2): Pt will complete toilet transfer with min assist OT Short Term Goal 4 (Week 2): Pt remain alert with eyes open appox 25% of therapy session with mod cues  Skilled Therapeutic Interventions/Progress Updates:    Pt seen for ADL retraining with focus on cognition, visual scanning, and functional transfers. Pt received supine in bed with breakfast present. Completed self-feeding with max cues for attention to task and scanning food tray to locate items. Pt with BP 97/62 after supine>sit with min c/o dizziness. Completed bathing and dressing sitting EOB with mod verbal cues for organizing and sequencing during dressing. BP 81/58 with sit<>stand and rest break d/t dizziness and fatigue. Pt with difficulty using crossover technique secondary to pain and edema in BLE however tolerated therapist assisting with positioning of BLE. Completed 1 sit<>stand at supervision level with cues for hand placement and all other sit<>stand and functional transfers with min assist. At end of session pt left sitting in w/c with BP at 124/83. All needs in reach and wife present. Pt oriented to situation and stated "they tell me I'm at [place]" when orienting to place.   Therapy Documentation Precautions:  Precautions Precautions: Fall Precaution Comments: low blood pressure, becomes dizzy Restrictions Weight Bearing Restrictions: No General:   Vital Signs: Therapy Vitals Temp: 98.5 F (36.9 C) Temp src: Oral Pulse Rate: 80 Resp: 18 BP: 104/66 mmHg Patient Position, if appropriate:  Lying Oxygen Therapy SpO2: 96 % O2 Device: None (Room air) Pain: Pt with no report of pain during therapy session.   See FIM for current functional status  Therapy/Group: Individual Therapy  Jonni Oelkers N 03/28/2013, 8:30 AM

## 2013-03-28 NOTE — Progress Notes (Signed)
NUTRITION FOLLOW-UP  DOCUMENTATION CODES Per approved criteria  -Not Applicable   INTERVENTION: Continue Ensure Complete to PO BID. RD to continue to follow nutrition care plan.  Nutrition Diagnosis: Inadequate oral intake related to poor appetite as evidenced by limited PO intake. Improving.  Goal: Intake to meet >90% of estimated nutrition needs. Improving.  Monitor:  weight trends, lab trends, I/O's, PO intake, supplement tolerance  ASSESSMENT: PMHx significant for HTN and CAD. Admitted 11/18 with AMS and fever, recent CVA. Cranial CT scan showed right temporal occipital parenchymal hematoma. Admitted to inpatient rehab for deconditioning from hospitalization on 12/2. Pt was followed by RD staff during acute hospitalization.  Continues on a Dysphagia 3 diet with Heart Healthy restrictions. Continues with orders for Ensure Complete PO BID. Meal intake now improved, pt is now eating 50-75%. Confirmed with nurse and nurse tech.  Sodium is at 132 and trending up.  Height: Ht Readings from Last 1 Encounters:  03/15/13 5\' 8"  (1.727 m)    Weight: Wt Readings from Last 1 Encounters:  03/23/13 166 lb (75.297 kg)  Admit wt 187 lb - stable  BMI:  Body mass index is 25.25 kg/(m^2). Overweight  Estimated Nutritional Needs: Kcal: 1700 - 2000 Protein: 80 - 95 g Fluid: 1.7 - 2 liters  Skin: stage II sacrum  Diet Order: Dysphagia 3  EDUCATION NEEDS: -No education needs identified at this time   Intake/Output Summary (Last 24 hours) at 03/28/13 1528 Last data filed at 03/28/13 1300  Gross per 24 hour  Intake    600 ml  Output   2579 ml  Net  -1979 ml    Last BM: 12/15  Labs:   Recent Labs Lab 03/26/13 0855  NA 132*  K 4.5  CL 96  CO2 26  BUN 29*  CREATININE 0.98  CALCIUM 8.4  GLUCOSE 114*    CBG (last 3)  No results found for this basename: GLUCAP,  in the last 72 hours  Scheduled Meds: . atorvastatin  20 mg Oral QHS  . divalproex  750 mg Oral Daily   . feeding supplement (ENSURE COMPLETE)  237 mL Oral BID BM  . furosemide  20 mg Oral Daily  . methylphenidate  5 mg Oral BID WC  . metoprolol tartrate  50 mg Oral BID  . pantoprazole  40 mg Oral Daily  . QUEtiapine  50 mg Oral QHS  . tamsulosin  0.4 mg Oral QPC breakfast    Continuous Infusions: none     Jarold Motto MS, RD, LDN Pager: [phone] After-hours pager: [phone]

## 2013-03-28 NOTE — Progress Notes (Signed)
At 1900 voided 125cc's, scan=409cc's, I & O cath=400cc's.  At 0030, no void, I & 0=925cc's. BLE's with 2 plus pitting edema. +/- sleep. Walter Larsen

## 2013-03-28 NOTE — Plan of Care (Signed)
Problem: RH SKIN INTEGRITY Goal: RH STG SKIN FREE OF INFECTION/BREAKDOWN Skin free of infection and no new skin breakdown with min assist while in Rehab  Outcome: Progressing Stage II to rectum when arrived to unit and has not progressively worsened. Air mattress overlay in use.

## 2013-03-28 NOTE — Plan of Care (Signed)
Problem: RH BLADDER ELIMINATION Goal: RH STG MANAGE BLADDER WITH ASSISTANCE STG Manage Bladder With min Assistance with voiding, if home with indwelling catheter min assist for maintaince by patient and/or caregiver  Outcome: Not Progressing Requiring I&O caths q6-8hrs. Unable to void

## 2013-03-28 NOTE — Progress Notes (Signed)
Physical Therapy Session Note  Patient Details  Name: Walter Larsen MRN: [id number] Date of Birth: 1936/03/24  Today's Date: 03/28/2013 Time: 1100-1200 (co-tx with AH, PT) and 1415-1445 Time Calculation (min): 60 min and 30 min  Short Term Goals: Week 2:  PT Short Term Goal 1 (Week 2): STG's=LTG's secondary to ELOS  Skilled Therapeutic Interventions/Progress Updates:    Treatment Session 1: Pt received seated in w/c with wife present; agreeable to PT session. Oriented x3 to self, time, and situation. Session focused on assessing/addressing how visual/vestibular impairments affect pt's safety/independence with functional mobility. Pt performed w/c mobility x150' in controlled environment using bilat UE's requiring minA, max verbal cueing (questioning) for attention, obstacle negotiation secondary to pt demonstration of downward gaze during w/c mobility. Sit<>stand x2 trials, both with +2A for manual facilitation at bilat knee's to maintain bilat LE position on ground. Gait 20', x8' with rolling walker and +2A with one therapist guiding rolling walker, other therapist providing manual facilitation at L axilla, R ribs for postural normalization. Verbal cueing provided to increase bilat step length, promote upright posture. Both gait trials ended, per pt request, secondary to pt c/o pain in posterior bilat LE's which increased with standing/walking. Pt demonstrates closed eyes during/after second gait trial; pt unable to verbalize reason. Seated vitals were as follows: BP 100/67, HR 87, SpO2 95%. Vitals (immediately post sit>stand): BP 99/67, HR 80. Pt transported remaining distance to gym, where therapist performed visual/vestibular exam. See below for detailed findings. Pt transported to room in w/c secondary to pt fatigue. Therapists departed with pt seated in w/c with quick release belt in place for safety, wife present, and all needs within reach.  Treatment Session 2: Pt received semi-reclined in bed;  asleep but easily awakened; agreeable to therapy. Oriented x4. Wife present.  Session focused on attempting gait training without use of rolling walker. Supine>sit with minA, HOB elevated 30 degrees, use of bed rail; cueing for initiation, hand placement. Self-propulsion of w/c in controlled environment x75' with bilat UE's requiring supervision, cueing to initiate technique; increased time required for self-correction, effective obstacle negotiation. Sit>stand from w/c with LUE support at halway hand rail with min A, manual facilitation at R knee to maintain RLE position. Gait x33', x10' with LUE support at hallway hand rail and R HHA requiring modA for stability/balance; verbal cueing for posture. Step length improved, as compared with abovementioned gait trials. Therapist transported pt to room in w/c secondary to pt fatigue. Therapist discussed discharge planning with wife, who agreed that pt discharge to home would not be safe at this time. Therapist departed with pt seated in w/c with quick release belt in place for safety, wife present, and all needs within reach.   Therapy Documentation Precautions:  Precautions Precautions: Fall Precaution Comments: low blood pressure, becomes dizzy Restrictions Weight Bearing Restrictions: No Vital Signs: Therapy Vitals Temp: 97.3 F (36.3 C) Temp src: Oral Pulse Rate: 87 Resp: 18 BP: 119/74 mmHg Patient Position, if appropriate: Sitting Oxygen Therapy SpO2: 98 % O2 Device: None (Room air) Pain: Pain Assessment Pain Assessment: No/denies pain Locomotion : Ambulation Ambulation/Gait Assistance: 3: Mod assist Wheelchair Mobility Distance: 75  Vision/Perception:    Pt requires mod-max verbal cueing to attend to visual targets during visual/vestibular testing. With horizontal tracking, impaired smooth pursuit (catch up saccades) and pt difficulty tracking R of midline. VOR impaired with vertical head movements. With saccades to R side, abnormal eye  movement suggestive of R-sided gaze-evoked nystagmus vs. poor occulomotor coordination (overshooting).  See  FIM for current functional status  Therapy/Group: Co-Treatment  Delno Blaisdell A 03/28/2013, 5:50 PM

## 2013-03-28 NOTE — Progress Notes (Signed)
77 y.o. right-handed male with history of hypertension, CAD with PTCA. Admit 03/01/2013 with altered mental status and fever. Recent admission to [facility] with hemorrhagic stroke and discharged 02/27/2013 after a three-day hospital stay. Presented with increased headache and nausea as well as hallucinations and altered mental status. Cranial CT scan showed right temporal occipital parenchymal hematoma measuring up to 7 cm in light of trace intraventricular extension without hydrocephalus. Patient was placed on vancomycin /Zosyn for fever of 100.3 white blood cell count 15,000. An MRI was completed 03/05/2013 during workup of fever rule out abscess that showed a large subacute hematoma right temporoparietal lobe as well small areas of acute infarct in the right occipital parietal lobe and left temporal parietal lobe. Neurosurgery Dr. Danielle Dess consulted advise conservative care in reference of right parietal ICH. TEE completed showing no thrombus without PFO no vegetation. Infectious disease consult and for fever of unknown origin with workup ongoing including CT of the chest 03/08/2013 showed bilateral lobar and segmental pulmonary emboli and bilateral lower extremity Doppler studies consistent with deep vein thrombosis involving the right common femoral vein and right peroneal vein as well as deep vein thrombosis left peroneal vein. Underwent removable Greenfield filter 03/08/2012 per interventional radiology. Depakote was added for headache as well as seizure prevention. Bouts of urinary retention Foley catheter tube inserted x2 Subjective/Complaints: "Happy Birthday"  No c/o oriented to person and hospital only Review of Systems - poor awareness of deficits  Objective: Vital Signs: Blood pressure 124/65, pulse 83, temperature 98.4 F (36.9 C), temperature source Oral, resp. rate 19, height 5\' 8"  (1.727 m), weight 75.297 kg (166 lb), SpO2 98.00%. No results found. Results for orders placed  during the hospital encounter of 03/15/13 (from the past 72 hour(s))  CBC WITH DIFFERENTIAL     Status: Abnormal   Collection Time    03/26/13  8:55 AM      Result Value Range   WBC 14.7 (*) 4.0 - 10.5 K/uL   RBC 3.30 (*) 4.22 - 5.81 MIL/uL   Hemoglobin 10.2 (*) 13.0 - 17.0 g/dL   HCT 16.1 (*) 09.6 - 04.5 %   MCV 91.2  78.0 - 100.0 fL   MCH 30.9  26.0 - 34.0 pg   MCHC 33.9  30.0 - 36.0 g/dL   RDW 40.9  81.1 - 91.4 %   Platelets 334  150 - 400 K/uL   Neutrophils Relative % 76  43 - 77 %   Lymphocytes Relative 7 (*) 12 - 46 %   Monocytes Relative 15 (*) 3 - 12 %   Eosinophils Relative 1  0 - 5 %   Basophils Relative 1  0 - 1 %   Neutro Abs 11.3 (*) 1.7 - 7.7 K/uL   Lymphs Abs 1.0  0.7 - 4.0 K/uL   Monocytes Absolute 2.2 (*) 0.1 - 1.0 K/uL   Eosinophils Absolute 0.1  0.0 - 0.7 K/uL   Basophils Absolute 0.1  0.0 - 0.1 K/uL   RBC Morphology POLYCHROMASIA PRESENT     WBC Morphology MILD LEFT SHIFT (1-5% METAS, OCC MYELO, OCC BANDS)    COMPREHENSIVE METABOLIC PANEL     Status: Abnormal   Collection Time    03/26/13  8:55 AM      Result Value Range   Sodium 132 (*) 135 - 145 mEq/L   Potassium 4.5  3.5 - 5.1 mEq/L   Chloride 96  96 - 112 mEq/L   CO2 26  19 - 32 mEq/L  Glucose, Bld 114 (*) 70 - 99 mg/dL   BUN 29 (*) 6 - 23 mg/dL   Creatinine, Ser 1.61  0.50 - 1.35 mg/dL   Calcium 8.4  8.4 - 09.6 mg/dL   Total Protein 6.6  6.0 - 8.3 g/dL   Albumin 1.9 (*) 3.5 - 5.2 g/dL   AST 43 (*) 0 - 37 U/L   ALT 19  0 - 53 U/L   Alkaline Phosphatase 148 (*) 39 - 117 U/L   Total Bilirubin 0.6  0.3 - 1.2 mg/dL   GFR calc non Af Amer 79 (*) >90 mL/min   GFR calc Af Amer >90  >90 mL/min   Comment: (NOTE)     The eGFR has been calculated using the CKD EPI equation.     This calculation has not been validated in all clinical situations.     eGFR's persistently <90 mL/min signify possible Chronic Kidney     Disease.     HEENT: normal and oral mucosa moist Cardio: RRR and no murmur Resp: CTA  B/L and unlabored GI: BS positive and non tender Extremity:  Pulses positive and No Edema Skin:  Gr 2-3 decub sacrum Neuro: Awakens to verbal and tactile stim, Cranial Nerve II-XII normal, Abnormal Sensory Difficulty cooperating with sensory exam. Poor attention to task, Normal Motor, Abnormal FMC Ataxic/ dec FMC and Other Able to spell world forward but not backwards Musc/Skel:  Normal General no acute distress   Assessment/Plan: 1. Functional deficits secondary to R temporal/occipital IPH as well as acute infarcts in R occipital/parietal and L temporal/ prieateal which require 3+ hours per day of interdisciplinary therapy in a comprehensive inpatient rehab setting. Physiatrist is providing close team supervision and 24 hour management of active medical problems listed below. Physiatrist and rehab team continue to assess barriers to discharge/monitor patient progress toward functional and medical goals.   FIM: FIM - Bathing Bathing Steps Patient Completed: Chest;Right Arm;Left Arm;Abdomen;Front perineal area Bathing: 4: Min-Patient completes 8-9 80f 10 parts or 75+ percent  FIM - Upper Body Dressing/Undressing Upper body dressing/undressing steps patient completed: Put head through opening of pull over shirt/dress;Pull shirt over trunk;Thread/unthread right sleeve of pullover shirt/dresss;Thread/unthread left sleeve of pullover shirt/dress Upper body dressing/undressing: 5: Set-up assist to: Obtain clothing/put away FIM - Lower Body Dressing/Undressing Lower body dressing/undressing steps patient completed: Pull underwear up/down;Pull pants up/down;Thread/unthread right underwear leg;Thread/unthread left underwear leg;Thread/unthread left pants leg;Don/Doff left sock Lower body dressing/undressing: 4: Min-Patient completed 75 plus % of tasks  FIM - Toileting Toileting steps completed by patient: Performs perineal hygiene Toileting Assistive Devices: Grab bar or rail for  support Toileting: 2: Max-Patient completed 1 of 3 steps  FIM - Diplomatic Services operational officer Devices: Grab bars;Walker Toilet Transfers: 3-From toilet/BSC: Mod A (lift or lower assist);3-To toilet/BSC: Mod A (lift or lower assist)  FIM - Banker Devices: Walker;Arm rests Bed/Chair Transfer: 3: Bed > Chair or W/C: Mod A (lift or lower assist);3: Chair or W/C > Bed: Mod A (lift or lower assist)  FIM - Locomotion: Wheelchair Distance: 150 Locomotion: Wheelchair: 4: Travels 150 ft or more: maneuvers on rugs and over door sillls with minimal assistance (Pt.>75%) FIM - Locomotion: Ambulation Locomotion: Ambulation Assistive Devices: Designer, industrial/product Ambulation/Gait Assistance: 1: +2 Total assist Locomotion: Ambulation: 1: Two helpers  Comprehension Comprehension Mode: Auditory Comprehension: 2-Understands basic 25 - 49% of the time/requires cueing 51 - 75% of the time  Expression Expression Mode: Verbal Expression: 2-Expresses basic 25 -  49% of the time/requires cueing 50 - 75% of the time. Uses single words/gestures.  Social Interaction Social Interaction: 2-Interacts appropriately 25 - 49% of time - Needs frequent redirection.  Problem Solving Problem Solving: 2-Solves basic 25 - 49% of the time - needs direction more than half the time to initiate, plan or complete simple activities  Memory Memory: 1-Recognizes or recalls less than 25% of the time/requires cueing greater than 75% of the time  Medical Problem List and Plan:  1. Right temporal parietal intracranial hemorrhage as well as right occipital infarct mental status slowly improving but has waxing /waning as expected 2. DVT Prophylaxis/Anticoagulation: Pulmonary emboli/DVT right common femoral vein and peroneal vein, left peroneal vein. 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 Pain Management/headaches. Depakote 750 mg daily  4.  Neuropsych: This patient is capable of making decisions on his own behalf.  5. ID. All antibiotics discontinued 03/08/2013 with workup unremarkable except for signs of DVT /PE , no source identified, WBCs decreasing afeb, repeat UA C and S neg,  Discussed with ID consultant who rec cont monitoring off acetaminophen 6. Hypertension. Normotensive off Norvasc 5 mg daily,still taking  Lopressor 50 mg twice a day. Monitor with increased mobility  7. Hyperlipidemia. Lipitor  8. BPH. Flomax.  ---foley removal 12/8, voiding trial still requiring caths, occ void 9. CAD/PTCA. Aspirin prior to admission discontinued secondary to hemorrhage. No chest pain or shortness of breath  10. Urinary retention. Foley catheter replaced  -UA negative 11. Insomnia/night time restlessness--reduce seroquel tonight 25-50mg , more alert this am 12.  Edema BLE no pain or redness,has bilateral DVTs and IVC filter  albumin is mildly reduced, ECHO 03/07/2013, "normal EF, no sig valvular disease, hold norvasc, Lasix not helping will d/c, use TEDs 13.  Sacral decub, sidelying , Foam dressing, add air mattress  LOS (Days) 13 A FACE TO FACE EVALUATION WAS PERFORMED  KIRSTEINS,ANDREW E 03/28/2013, 5:58 AM

## 2013-03-29 ENCOUNTER — Inpatient Hospital Stay (HOSPITAL_COMMUNITY): Payer: Medicare Other

## 2013-03-29 ENCOUNTER — Inpatient Hospital Stay (HOSPITAL_COMMUNITY): Payer: Medicare Other | Admitting: Physical Therapy

## 2013-03-29 NOTE — Progress Notes (Signed)
Occupational Therapy Session Note  Patient Details  Name: Walter Larsen MRN: [id number] Date of Birth: 05-05-1935  Today's Date: 03/29/2013 Time: 0930-1030 Time Calculation (min): 60 min  Short Term Goals: Week 2:  OT Short Term Goal 1 (Week 2): Pt demonstrate sustained attention to self-care task without cues OT Short Term Goal 2 (Week 2): Pt will consistently don shirt with min assist OT Short Term Goal 3 (Week 2): Pt will complete toilet transfer with min assist OT Short Term Goal 4 (Week 2): Pt remain alert with eyes open appox 25% of therapy session with mod cues  Skilled Therapeutic Interventions/Progress Updates:    Pt seen for ADL retraining with focus on attention to task, overall cognition, functional transfers, and awareness. Pt oriented to place and situation during therapy session. Completed bathing with min assist and min verbal cues for sequencing and organization of task. Required min assist for sit<>stand and standing balance during bathing task with cues for encouragement and UE placement. Pt required min assist for SPT to TTB in walk-in shower and supervision when transferring back to w/c with only 1 cue.  Pt required increased assist and cues when donning l/s shirt as he was consistently threading UEs into head opening and unable to self-correct or sequence task. Pt required assist for crossover tech secondary to edema and pain in BLE. Pt required mod verbal cues for sequencing of LB dressing and cues to visually attend to task. Pt's preacher present during therapy session and pt verbalizing difficulties he is having during self-care tasks. At end of session pt left sitting in w/c with family present and QRB donned.   Therapy Documentation Precautions:  Precautions Precautions: Fall Precaution Comments: low blood pressure, becomes dizzy Restrictions Weight Bearing Restrictions: No General:   Vital Signs:   Pain: Pain Assessment Pain Assessment: No/denies pain Pain  Score: 0-No pain  See FIM for current functional status  Therapy/Group: Individual Therapy  Daneil Dan 03/29/2013, 2:13 PM

## 2013-03-29 NOTE — Progress Notes (Signed)
Physical Therapy Session Note  Patient Details  Name: Walter Larsen MRN: [id number] Date of Birth: March 08, 1936  Today's Date: 03/29/2013 Time: 0800-0900 and 1130-1201 Time Calculation (min): 60 min and 31 min  Short Term Goals: Week 2:  PT Short Term Goal 1 (Week 2): STG's=LTG's secondary to ELOS  Skilled Therapeutic Interventions/Progress Updates:    Treatment Session 1: Pt received semi-reclined in bed with son present. Pt agreeable to therapy. Oriented x3 without cueing. With min verbal cueing, able to orient self to date using visual aid on wall. Supine>sit with supervision, HOB elevated, using rails requiring multimodal cues to initiate RUE across midline. Pt performed w/c propulsion x150' with bilat UE's with supervision, mod verbal cueing to remind pt to decrease talking (to address pt distraction) with effective within-session carryover. Gait x18' with L hallway hand rail and min A, L HHA; after seated rest break x2 minutes, performed gait x12' with min A, R HHA. Negotiation of two 3" stairs with bilat hand rails, step-to pattern, forward-facing for ascent, backward for descent with mod A. Pt transported to room in w/c secondary to pt fatigue. Pt arousal/attention significant improved during this session, as compared with previous sessions.Therapist departed pt room with pt seated in w/c with quick release belt on for safety, son present, and all needs met.  Treatment Session 2: Pt received seated in w/c with son present; agreeable to PT. Oriented x2 to self and situation; with max verbal cueing, pt able to orient self to current date using visual aid on wall. Pt unable to orient self to place with max verbal cueing. Session focused on w/c mobility, initiating gait with single HHA. W/c mobility x70' as abovementioned with min A for obstacle negotiation, increased time, and mod verbal cues for attention to task. Ineffective carryover of verbal cueing to decrease talking to minimize pt distraction.  Gait with min A, L HHA (+2A required for w/c follow for safety secondary to decreased pt attention to mobility tasks) x8' prior to pt request to sit secondary to bilat LE fatigue. During gait trial, pt exhibits inconsistent return demonstration of verbal cues to increase stride length. Transported pt to room in w/c secondary to pt fatigue. Therapist departed pt room with pt seated in w/c with quick release belt on for safety, son present, and all needs met.  Therapy Documentation Precautions:  Precautions Precautions: Fall Precaution Comments: low blood pressure, becomes dizzy Restrictions Weight Bearing Restrictions: No Pain: Pain Assessment Pain Assessment: No/denies pain Pain Score: 0-No pain Locomotion : Ambulation Ambulation/Gait Assistance: 1: +2 Total assist (for w/c follow) Wheelchair Mobility Distance: 70   See FIM for current functional status  Therapy/Group: Individual Therapy  Hobble, [name] 03/29/2013, 12:10 PM

## 2013-03-29 NOTE — Progress Notes (Signed)
Speech Language Pathology Daily Session Note  Patient Details  Name: Walter Larsen MRN: [id number] Date of Birth: [birth year]  Today's Date: 03/29/2013 Time: 1045-1130 Time Calculation (min): 45 min  Short Term Goals: Week 2: SLP Short Term Goal 1 (Week 2): Pt will sustain attention to basic, structured task for 60 seconds with Max cues SLP Short Term Goal 2 (Week 2): Pt will follow one-step commands with 80% accuracy with Mod cues SLP Short Term Goal 3 (Week 2): Pt will answer basic yes/no questions with 80% accuracy with Max cues SLP Short Term Goal 4 (Week 2): Pt will demonstrate orientation x4 with Max cues SLP Short Term Goal 5 (Week 2): Pt will name common objects with 80% accuracy with Mod cues  Skilled Therapeutic Interventions: Treatment focused on cognitive goals. SLP facilitated session with pictures cards with 4 items, branching down to cards with 2 items due to difficulty level. Pt required Max cues for sustained attention as he was significantly distracted by pain (RN made aware). Pt required Max cues to label objects in pictures and to determine how they were alike. Pt was oriented to person, place and situation and was overall more alert today. Continue plan of care.   FIM:  Comprehension Comprehension Mode: Auditory Comprehension: 2-Understands basic 25 - 49% of the time/requires cueing 51 - 75% of the time Expression Expression Mode: Verbal Expression: 3-Expresses basic 50 - 74% of the time/requires cueing 25 - 50% of the time. Needs to repeat parts of sentences. Social Interaction Social Interaction: 2-Interacts appropriately 25 - 49% of time - Needs frequent redirection. Problem Solving Problem Solving: 2-Solves basic 25 - 49% of the time - needs direction more than half the time to initiate, plan or complete simple activities Memory Memory: 1-Recognizes or recalls less than 25% of the time/requires cueing greater than 75% of the time  Pain  Pain in buttocks, RN  made aware  Therapy/Group: Individual Therapy   Maxcine Ham, M.A. CCC-SLP [phone]   Maxcine Ham 03/29/2013, 3:56 PM

## 2013-03-29 NOTE — Progress Notes (Signed)
77 y.o. right-handed male with history of hypertension, CAD with PTCA. Admit 03/01/2013 with altered mental status and fever. Recent admission to [facility] with hemorrhagic stroke and discharged 02/27/2013 after a three-day hospital stay. Presented with increased headache and nausea as well as hallucinations and altered mental status. Cranial CT scan showed right temporal occipital parenchymal hematoma measuring up to 7 cm in light of trace intraventricular extension without hydrocephalus. Patient was placed on vancomycin /Zosyn for fever of 100.3 white blood cell count 15,000. An MRI was completed 03/05/2013 during workup of fever rule out abscess that showed a large subacute hematoma right temporoparietal lobe as well small areas of acute infarct in the right occipital parietal lobe and left temporal parietal lobe. Neurosurgery Dr. Danielle Dess consulted advise conservative care in reference of right parietal ICH. TEE completed showing no thrombus without PFO no vegetation. Infectious disease consult and for fever of unknown origin with workup ongoing including CT of the chest 03/08/2013 showed bilateral lobar and segmental pulmonary emboli and bilateral lower extremity Doppler studies consistent with deep vein thrombosis involving the right common femoral vein and right peroneal vein as well as deep vein thrombosis left peroneal vein. Underwent removable Greenfield filter 03/08/2012 per interventional radiology. Depakote was added for headache as well as seizure prevention. Bouts of urinary retention Foley catheter tube inserted x2 Subjective/Complaints: More aware, eyes open, slept on and off per son yesterday Review of Systems - poor awareness of deficits  Objective: Vital Signs: Blood pressure 123/82, pulse 74, temperature 98.1 F (36.7 C), temperature source Oral, resp. rate 18, height 5\' 8"  (1.727 m), weight 75.297 kg (166 lb), SpO2 98.00%. No results found. Results for orders placed during  the hospital encounter of 03/15/13 (from the past 72 hour(s))  CBC WITH DIFFERENTIAL     Status: Abnormal   Collection Time    03/26/13  8:55 AM      Result Value Range   WBC 14.7 (*) 4.0 - 10.5 K/uL   RBC 3.30 (*) 4.22 - 5.81 MIL/uL   Hemoglobin 10.2 (*) 13.0 - 17.0 g/dL   HCT 16.1 (*) 09.6 - 04.5 %   MCV 91.2  78.0 - 100.0 fL   MCH 30.9  26.0 - 34.0 pg   MCHC 33.9  30.0 - 36.0 g/dL   RDW 40.9  81.1 - 91.4 %   Platelets 334  150 - 400 K/uL   Neutrophils Relative % 76  43 - 77 %   Lymphocytes Relative 7 (*) 12 - 46 %   Monocytes Relative 15 (*) 3 - 12 %   Eosinophils Relative 1  0 - 5 %   Basophils Relative 1  0 - 1 %   Neutro Abs 11.3 (*) 1.7 - 7.7 K/uL   Lymphs Abs 1.0  0.7 - 4.0 K/uL   Monocytes Absolute 2.2 (*) 0.1 - 1.0 K/uL   Eosinophils Absolute 0.1  0.0 - 0.7 K/uL   Basophils Absolute 0.1  0.0 - 0.1 K/uL   RBC Morphology POLYCHROMASIA PRESENT     WBC Morphology MILD LEFT SHIFT (1-5% METAS, OCC MYELO, OCC BANDS)    COMPREHENSIVE METABOLIC PANEL     Status: Abnormal   Collection Time    03/26/13  8:55 AM      Result Value Range   Sodium 132 (*) 135 - 145 mEq/L   Potassium 4.5  3.5 - 5.1 mEq/L   Chloride 96  96 - 112 mEq/L   CO2 26  19 - 32 mEq/L  Glucose, Bld 114 (*) 70 - 99 mg/dL   BUN 29 (*) 6 - 23 mg/dL   Creatinine, Ser 1.61  0.50 - 1.35 mg/dL   Calcium 8.4  8.4 - 09.6 mg/dL   Total Protein 6.6  6.0 - 8.3 g/dL   Albumin 1.9 (*) 3.5 - 5.2 g/dL   AST 43 (*) 0 - 37 U/L   ALT 19  0 - 53 U/L   Alkaline Phosphatase 148 (*) 39 - 117 U/L   Total Bilirubin 0.6  0.3 - 1.2 mg/dL   GFR calc non Af Amer 79 (*) >90 mL/min   GFR calc Af Amer >90  >90 mL/min   Comment: (NOTE)     The eGFR has been calculated using the CKD EPI equation.     This calculation has not been validated in all clinical situations.     eGFR's persistently <90 mL/min signify possible Chronic Kidney     Disease.     HEENT: normal and oral mucosa moist Cardio: RRR and no murmur Resp: CTA B/L and  unlabored GI: BS positive and non tender Extremity:  Pulses positive and No Edema Skin:  Gr 2-3 decub sacrum Neuro: Awakens to verbal and tactile stim, Cranial Nerve II-XII normal, Abnormal Sensory Difficulty cooperating with sensory exam. Poor attention to task, Normal Motor, Abnormal FMC Ataxic/ dec FMC and Other Able to spell world forward but not backwards Musc/Skel:  Normal General no acute distress   Assessment/Plan: 1. Functional deficits secondary to R temporal/occipital IPH as well as acute infarcts in R occipital/parietal and L temporal/ prieateal which require 3+ hours per day of interdisciplinary therapy in a comprehensive inpatient rehab setting. Physiatrist is providing close team supervision and 24 hour management of active medical problems listed below. Physiatrist and rehab team continue to assess barriers to discharge/monitor patient progress toward functional and medical goals.   FIM: FIM - Bathing Bathing Steps Patient Completed: Chest;Right Arm;Left Arm;Abdomen;Front perineal area;Buttocks;Right upper leg;Left upper leg Bathing: 4: Min-Patient completes 8-9 26f 10 parts or 75+ percent  FIM - Upper Body Dressing/Undressing Upper body dressing/undressing steps patient completed: Put head through opening of pull over shirt/dress;Pull shirt over trunk;Thread/unthread right sleeve of pullover shirt/dresss;Thread/unthread left sleeve of pullover shirt/dress Upper body dressing/undressing: 5: Set-up assist to: Obtain clothing/put away FIM - Lower Body Dressing/Undressing Lower body dressing/undressing steps patient completed: Pull underwear up/down;Pull pants up/down;Thread/unthread left underwear leg;Thread/unthread left pants leg Lower body dressing/undressing: 3: Mod-Patient completed 50-74% of tasks  FIM - Toileting Toileting steps completed by patient: Performs perineal hygiene Toileting Assistive Devices: Grab bar or rail for support Toileting: 2: Max-Patient completed  1 of 3 steps  FIM - Diplomatic Services operational officer Devices: Grab bars;Walker Toilet Transfers: 3-From toilet/BSC: Mod A (lift or lower assist);3-To toilet/BSC: Mod A (lift or lower assist)  FIM - Bed/Chair Transfer Bed/Chair Transfer Assistive Devices: Arm rests;Bed rails;HOB elevated Bed/Chair Transfer: 4: Supine > Sit: Min A (steadying Pt. > 75%/lift 1 leg);4: Bed > Chair or W/C: Min A (steadying Pt. > 75%)  FIM - Locomotion: Wheelchair Distance: 75 Locomotion: Wheelchair: 2: Travels 50 - 149 ft with supervision, cueing or coaxing FIM - Locomotion: Ambulation Locomotion: Ambulation Assistive Devices: Other (comment) (L hallway hand rail) Ambulation/Gait Assistance: 3: Mod assist Locomotion: Ambulation: 1: Travels less than 50 ft with moderate assistance (Pt: 50 - 74%)  Comprehension Comprehension Mode: Auditory Comprehension: 3-Understands basic 50 - 74% of the time/requires cueing 25 - 50%  of the time  Expression Expression Mode:  Verbal Expression: 3-Expresses basic 50 - 74% of the time/requires cueing 25 - 50% of the time. Needs to repeat parts of sentences.  Social Interaction Social Interaction: 3-Interacts appropriately 50 - 74% of the time - May be physically or verbally inappropriate.  Problem Solving Problem Solving: 2-Solves basic 25 - 49% of the time - needs direction more than half the time to initiate, plan or complete simple activities  Memory Memory: 1-Recognizes or recalls less than 25% of the time/requires cueing greater than 75% of the time  Medical Problem List and Plan:  1. Right temporal parietal intracranial hemorrhage as well as right occipital infarct mental status slowly improving but has waxing /waning as expected 2. DVT Prophylaxis/Anticoagulation: Pulmonary emboli/DVT right common femoral vein and peroneal vein, left peroneal vein. 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 Pain Management/headaches.  Depakote 750 mg daily  4. Neuropsych: This patient is capable of making decisions on his own behalf.  5. ID. All antibiotics discontinued 03/08/2013 with workup unremarkable except for signs of DVT /PE , no source identified, WBCs decreasing afeb, repeat UA C and S neg,  Discussed with ID consultant who rec cont monitoring off acetaminophen 6. Hypertension. Normotensive off Norvasc 5 mg daily,still taking  Lopressor 50 mg twice a day. Monitor with increased mobility  7. Hyperlipidemia. Lipitor  8. BPH. Flomax.  ---foley removal 12/8, voiding trial still requiring caths, occ void 9. CAD/PTCA. Aspirin prior to admission discontinued secondary to hemorrhage. No chest pain or shortness of breath  10. Urinary retention. Foley catheter replaced  -UA negative 11. Insomnia/night time restlessness--reduce seroquel tonight 25-50mg , more alert this am 12.  Edema BLE no pain or redness,has bilateral DVTs and IVC filter  albumin is mildly reduced, ECHO 03/07/2013, "normal EF, no sig valvular disease, hold norvasc, Lasix not helping will d/c, use TEDs 13.  Sacral decub, sidelying , Foam dressing, add air mattress  LOS (Days) 14 A FACE TO FACE EVALUATION WAS PERFORMED  Carinna Newhart E 03/29/2013, 8:42 AM

## 2013-03-29 NOTE — Consult Note (Signed)
NEUROBEHAVIORAL STATUS EXAM - [facility]   MEDICAL NECESSITY:  [name] was seen on the [facility] for a neurobehavioral status exam owing to the patient's diagnosis of stroke, and to assist in treatment planning during admission.   According to medical records, Walter Larsen was admitted to the rehab unit owing to stroke. He was admitted on 03/01/13 with altered mental status and fever. He was previously admitted to [facility] with hemorrhagic stroke and discharged on 02/27/13 after a three-day hospital stay. Cranial CT scan showed right temporal occipital parenchymal hematoma without hydrocephalus. Patient was placed on vancomycin /Zosyn for fever of 100.3 and white blood cell count of 15,000. A brain MRI scan was purportedly completed on 03/05/13 to rule out abscess. This scan revealed a large subacute hematoma in the right temporoparietal lobe as well small areas of acute infarct in the right occipital-parietal lobe and left temporal-parietal lobe.   During today's visit, Walter Larsen was accompanied by his wife. He reported suffering from memory loss and poor focus. He described experiencing issues with spatial judgment and what sounded like simultagnosia. He also endorsed suffering from a number of symptoms that would be described as executive cognitive issues. Of note, the patient seemed marked confused at times. In fact, when asked who was visiting him today, he called his wife by the wrong name (i.e., Waynetta Sandy - which is their daughter's name). Additionally, Mr. [name] tended to drift off and by the end of the appointment he was asleep.   Walter Larsen has no history of mental health issues but described his current mood as "up and down." He has a good social support system, though upon discharge he has no in-home support except for his wife. The patient believes that he is making strides in therapy and wishes to return home. I am  concerned about his ability to function at home with only his wife's assistance.   PROCEDURES ADMINISTERED: [2 units [id number] on 03/28/13] Diagnostic clinical interview  Review of available records Mental Status Exam-2 (brief version)  MENTAL STATUS: Mr. Min's mental status exam score of 7/16 was well below the cutoff used to indicate severe cognitive impairment or dementia. He was only able to immediately repeat 1 of 3 words but recalled 2 of 3 words after a very short delay. Marked confusion was noted during many tasks. He was disoriented to time with the exception of the year. He was unaware of the county or city he was presently in.   Emotional & Behavioral Evaluation: Walter Larsen was appropriately dressed for season and situation. He was generally friendly and rapport was adequately established. His speech was fluent but he had a hard time expressing himself. He tended to get words mixed up. He had a hard time understanding test directions at times. His affect was flat and he appeared depressed. Attention and motivation were adequate. Optimal test taking conditions were maintained.  From an emotional standpoint, Walter Larsen endorsed experiencing at least a mild degree of depression owing to his present medical situation. He denied any issues adjusting to his present hospitalization. Suicidal/homicidal ideation, plan or intent was denied. No manic or hypomanic episodes were reported. The patient denied ever experiencing any auditory/visual hallucinations. No major behavioral or personality changes were endorsed.    Overall, Walter Larsen appears to be experiencing a great deal of cognitive deficit (at the level of dementia) secondary to stroke. He is also suffering from at least a mild degree of  depression. At this time, my greatest concern is his wife's ability to care for the patient upon discharge. In addition, it seems as if she is not entirely aware of the nature and severity of her husband's cognitive  impairments.   It seems that the best course of action at this time is to provide Walter Larsen with as many resources as possible for in-home care, or consider patient placement. It would be of benefit for Dr. Wylene Simmer to see this patient again this Thursday to verify the chronic nature of Walter Larsen's deficits and further educate Mrs. Million on prognosis.   Plan:    Repeat mental status exam this Thursday with Dr. Wylene Simmer    Provide education on recovery and care needs    Debbe Mounts, Psy.D.  Clinical Neuropsychologist

## 2013-03-30 ENCOUNTER — Inpatient Hospital Stay (HOSPITAL_COMMUNITY): Payer: Medicare Other | Admitting: Physical Therapy

## 2013-03-30 ENCOUNTER — Inpatient Hospital Stay (HOSPITAL_COMMUNITY): Payer: Medicare Other

## 2013-03-30 MED ORDER — LIDOCAINE HCL 2 % EX GEL
Freq: Once | CUTANEOUS | Status: DC
  Filled 2013-03-30: qty 20

## 2013-03-30 NOTE — Progress Notes (Signed)
Occupational Therapy Session Note  Patient Details  Name: Mariana Goytia MRN: [id number] Date of Birth: 01/15/1936  Today's Date: 03/30/2013 Time: 0930-1027 Time Calculation (min): 57 min  Short Term Goals: Week 3:     Skilled Therapeutic Interventions/Progress Updates:    Pt seen for ADL retraining with focus on cognition, attention to task, functional transfers, and activity tolerance. Complete bathing with min assist for buttocks and steadying assist for standing balance. Pt required min cues for washing all body parts and max cues to attend to task as pt verbalizing random thoughts throughout session. Verbalization were realistic. Completed all transfers and sit<>stand with min guard. Required mod cues for organization and sequencing with dressing. Pt more alert during session and increased participation noted. Pt with pain with cervical extension. Therapist provided stretching and massage to assist with this. Asked granddaughter to bring picture of pt's dog in to place at tv level to give pt cue for looking up. Pt left in w/c with quick release belt and all needs in reach and granddaughter present.  Therapy Documentation Precautions:  Precautions Precautions: Fall Precaution Comments: low blood pressure, becomes dizzy Restrictions Weight Bearing Restrictions: No General:   Vital Signs: Therapy Vitals Pulse Rate: 86 BP: 105/62 mmHg Pain: Pt reporting pain at buttocks. RN aware and therapist switched w/c cushions to increase support and comfort.   See FIM for current functional status  Therapy/Group: Individual Therapy  Daneil Dan 03/30/2013, 10:58 AM

## 2013-03-30 NOTE — Progress Notes (Signed)
Speech Language Pathology Daily Session Note  Patient Details  Name: Walter Larsen MRN: [id number] Date of Birth: 1936-03-20  Today's Date: 03/30/2013 Time: 1415-1500 Time Calculation (min): 45 min  Short Term Goals: Week 3: SLP Short Term Goal 1 (Week 3): Pt will sustain attention to basic, structured task for 60 seconds with Max cues SLP Short Term Goal 2 (Week 3): Pt will follow one-step commands with 80% accuracy with Mod cues SLP Short Term Goal 3 (Week 3): Pt will answer basic yes/no questions with 80% accuracy with Max cues SLP Short Term Goal 4 (Week 3): Pt will demonstrate orientation x4 with Supervision SLP Short Term Goal 5 (Week 3): Pt will name common objects with 80% accuracy with Mod cues  Skilled Therapeutic Interventions: Treatment focused on cognitive goals. SLP facilitated session with Max cues for sustained attention up to ~30 seconds at a time. Pt was oriented to person and month, but required Mod-Max cues for orientation to location, situation, and date/day of week. He sorted coins into two categories with Max cues. Pt demonstrated basic problem solving when trying to lock and unlock his wheelchair with Max cues. Continue plan of care.   FIM:  Comprehension Comprehension Mode: Auditory Comprehension: 2-Understands basic 25 - 49% of the time/requires cueing 51 - 75% of the time Expression Expression Mode: Verbal Expression: 3-Expresses basic 50 - 74% of the time/requires cueing 25 - 50% of the time. Needs to repeat parts of sentences. Social Interaction Social Interaction: 2-Interacts appropriately 25 - 49% of time - Needs frequent redirection. Problem Solving Problem Solving: 2-Solves basic 25 - 49% of the time - needs direction more than half the time to initiate, plan or complete simple activities Memory Memory: 1-Recognizes or recalls less than 25% of the time/requires cueing greater than 75% of the time FIM - Eating Eating Activity: 5: Needs verbal  cues/supervision  Pain Pain Assessment Pain Assessment: No/denies pain Pain Score: 0-No pain  Therapy/Group: Individual Therapy   Maxcine Ham, M.A. CCC-SLP [phone]   Maxcine Ham 03/30/2013, 4:16 PM

## 2013-03-30 NOTE — Plan of Care (Signed)
Problem: RH Ambulation Goal: LTG Patient will ambulate in community environment (PT) LTG: Patient will ambulate in community environment, # of feet with assistance (PT).  Outcome: Not Applicable Date Met:  03/30/13 Community ambulation is not functional for pt at this time. [facility] 03/30/13

## 2013-03-30 NOTE — Progress Notes (Signed)
Social Work Patient ID: Walter Larsen, male   DOB: 21-May-1935, 77 y.o.   MRN: [id number] Met with pt and wife who was here to inform of team conference progression in the last few days and discharge extension.  Both pleased with how well he is doing. Wife is agreeable to the discharge and feels much better about providing care if he continues to do the way he is doing.  Also pleased with him starting to void. She reports their granddaughter home on school break is willing to help.  Will work on discharge needs and continue family education with wife.

## 2013-03-30 NOTE — Patient Care Conference (Signed)
Inpatient RehabilitationTeam Conference and Plan of Care Update Date: 03/30/2013   Time: 11;00 Am    Patient Name: Walter Larsen      Medical Record Number: [id number]  Date of Birth: 05/21/1935 Sex: Male         Room/Bed: 4M04C/4M04C-01 Payor Info: Payor: Multimedia programmer / Plan: [employer] MEDICARE COMPLETE / Product Type: *No Product type* /    Admitting Diagnosis: CVA  Admit Date/Time:  03/15/2013  5:54 PM Admission Comments: No comment available   Primary Diagnosis:  <principal problem not specified> Principal Problem: <principal problem not specified>  Patient Active Problem List   Diagnosis Date Noted  . ICH (intracerebral hemorrhage) 03/15/2013  . Dry eyes 03/08/2013  . Hyponatremia 03/08/2013  . Headache 03/08/2013  . Acute encephalopathy 03/08/2013  . Pulmonary emboli 03/08/2013  . Other and unspecified hyperlipidemia 03/02/2013  . Bradycardia 03/02/2013  . Hypokalemia 03/02/2013  . Hemorrhagic stroke 03/01/2013  . Essential hypertension, benign 03/01/2013  . Fever, unspecified 03/01/2013    Expected Discharge Date: Expected Discharge Date: 04/06/13  Team Members Present: Physician leading conference: Dr. Claudette Laws Social Worker Present: Dossie Der, LCSW Nurse Present: Other (comment) Morrie Sheldon Royal-RN) PT Present: Wanda Plump, PT;Other (comment) Anson Fret) OT Present: Scherrie November, OT SLP Present: Maxcine Ham, SLP PPS Coordinator present : Edson Snowball, Chapman Fitch, RN, CRRN     Current Status/Progress Goal Weekly Team Focus  Medical   lethargy improving  improve alertness, increase bladder emptying  voiding trial continues   Bowel/Bladder   cont of bowel; started on flomax starting to void, checking PVR's, PRN I&O cath q6-8h if no void or PVR>350  remain continent of bowel while improving bladder function  cont flomax and assess effectiveness   Swallow/Nutrition/ Hydration     Regular Diet        ADL's   setup-mod assist  UB dressing, mod-min assist LB dressing, min assist functional trasnfers, min assist toilet task, min assist bathing  downgraded to min assist overall  cognition, strengthening, actiivty tolerance, functional transfers, awareness, postural control in staning, NMR   Mobility   supervision to min A  with bed mobility, min A for bed<>chair, min-mod A for gait and stair negotiation  supervision to min A overall  gait training, w/c mobility, stair negotiation, hands-on family education   Communication   Max cues for confrontational naming, topic maintenance  Mod  naming, confrontational linguistic tasks, topic maintenance   Safety/Cognition/ Behavioral Observations  oriented x3, Max A for sustained attention  Mod A  orientation, sustained attention   Pain   n/a         Skin   healing wound to buttock improving, EPBC used and air mattress overlay; barrier cream and microgard powder   skin will remin free of other breakdown and pressure ulcer healed  continue air mattress overlay, remind to reposition in bed and in chair; monitor skin qshift and prn      *See Care Plan and progress notes for long and short-term goals.  Barriers to Discharge: poor alertness    Possible Resolutions to Barriers:  cont rehab/family ed    Discharge Planning/Teaching Needs:  Need to discuss with wife discharge plan, this worker does know feel wife can manage pt at home at his current level      Team Discussion:  Last few days pt has been more alert and making good progress.  Wife here to participate in therapies. Beginning to void now.  Want to extend pt here  Revisions to Treatment Plan:  Extended d/c date due to pt';s progress and alertness   Continued Need for Acute Rehabilitation Level of Care: The patient requires daily medical management by a physician with specialized training in physical medicine and rehabilitation for the following conditions: Daily direction of a multidisciplinary physical  rehabilitation program to ensure safe treatment while eliciting the highest outcome that is of practical value to the patient.: Yes Daily medical management of patient stability for increased activity during participation in an intensive rehabilitation regime.: Yes Daily analysis of laboratory values and/or radiology reports with any subsequent need for medication adjustment of medical intervention for : Neurological problems  [name], Lemar Livings 03/31/2013, 3:27 PM

## 2013-03-30 NOTE — Progress Notes (Signed)
Physical Therapy Weekly Progress Note  Patient Details  Name: Walter Larsen MRN: [id number] Date of Birth: 08-Nov-1935  Today's Date: 03/30/2013 Time: 0800-0900 and [id number] Time Calculation (min): 60 min and 30 min  Sessions will continue to focus on achievement of long-term goals. Since last weekly note, barriers to progress (orthostatic hypotension, decreased pt arousal, and pain) have improved significantly and pt has therefore demonstrated marked improvements in attention, awareness, initiation and independence with gait, w/c mobility, functional transfers, and stair negotiation.   Patient continues to demonstrate the following deficits: limited attention and awareness, decreased functional endurance, pain, decreased standing/walking tolerance, visual deficits, and therefore will continue to benefit from skilled PT intervention to enhance overall performance with activity tolerance, balance, postural control, ability to compensate for deficits, functional use of  right upper extremity and right lower extremity, attention and awareness.  Patient is slowly progressing toward long-term goals. Goals addressing the following aspects of functional mobility have been downgraded: stair negotiation, car transfers, furniture transfers, and gait in community and home environments. Community ambulation goal has been discharged at this time, as this is no longer functional for patient.  PT Short Term Goals Week 3:  PT Short Term Goal 1 (Week 3): STG's = LTG's secondary to ELOS  Skilled Therapeutic Interventions/Progress Updates:    Treatment Session 1: Pt received semi-reclined in bed, awake, with granddaughter present. Pt agreeable to session. Oriented x4. Pt noted to have had incontinent BM. Performed bilat rolling with supervision/cueing to change brief and perform peri care. Supine>sit with min A for RLE management, HOB elevated, use of bed rail; pt initiated movement of RUE across midline, grasped bed  rail without cueing. Donned bilat TED hose, shoes while seated EOB with single UE support at rail, close supervision for balance. Squat-pivot from bed>w/c with min A, verbal cueing for hand placement. Performed w/c mobility x100' in controlled environment using bilat UE's requiring supervision and increased time; effective obstacle negotiation. Min verbal cueing required throughout session for management of w/c brakes.   Gait x40' with min A, L HHA and verbal cues to increase bilat step length, promote upright posture. Verbal cues provided to encourage pt to take short standing rest breaks (as opposed to ending gait trial) when fatigued; pt responded to short rest breaks with less anxiety and improved walking tolerance. Negotiation of three 6" stairs with bilat hand rails; reciprocal pattern and min A to ascend, step-to pattern and mod A to descend. Therapist transported pt to room in w/c secondary to pt fatigue. Therapist departed with pt seated in w/c with granddaughter present and all needs within reach.  Treatment Session 2: Pt received supine in bed; asleep and difficult to awaken and orient. Oriented x2 to self and place; able to orient to situation with max cueing/questioning). Wife present. Session focused on initiating w/c propulsion and transfers in home environment. Supine>sit with min A for RLE management, HOB elevated, use of bed rail, verbal cueing required to initiate movement of RUE across midline, to grasp rail. Pt required significantly more cues for attention throughout session. W/c mobility x150' in controlled environment, x20' in home environment with min A for obstacle negotiation, increased time, and mod cues for attention/awareness. Mod-max verbal cueing required throughout session for management of w/c brakes. Once in apartment, performed squat-pivot from couch<>w/c with min A, verbal cueing for hand placement. Therapist transported pt to room in w/c secondary to pt fatigue. Therapist  departed with pt seated in w/c with granddaughter present and all needs within reach.  Therapy Documentation Precautions:  Precautions Precautions: Fall Precaution Comments: low blood pressure, becomes dizzy Restrictions Weight Bearing Restrictions: No Locomotion : Wheelchair Mobility Distance: 150   See FIM for current functional status  Therapy/Group: Individual Therapy  Calvert Cantor 03/30/2013, 6:19 PM

## 2013-03-30 NOTE — Progress Notes (Signed)
Speech Language Pathology Weekly Progress Note  Patient Details  Name: Walter Larsen MRN: [id number] Date of Birth: 09-21-1935  Today's Date: 03/30/2013  Short Term Goals: Week 2: SLP Short Term Goal 1 (Week 2): Pt will sustain attention to basic, structured task for 60 seconds with Max cues SLP Short Term Goal 1 - Progress (Week 2): Not met SLP Short Term Goal 2 (Week 2): Pt will follow one-step commands with 80% accuracy with Mod cues SLP Short Term Goal 2 - Progress (Week 2): Not met SLP Short Term Goal 3 (Week 2): Pt will answer basic yes/no questions with 80% accuracy with Max cues SLP Short Term Goal 3 - Progress (Week 2): Not met SLP Short Term Goal 4 (Week 2): Pt will demonstrate orientation x4 with Max cues SLP Short Term Goal 4 - Progress (Week 2): Met SLP Short Term Goal 5 (Week 2): Pt will name common objects with 80% accuracy with Mod cues SLP Short Term Goal 5 - Progress (Week 2): Not met  New Short Term Goals: Week 3: SLP Short Term Goal 1 (Week 3): Pt will sustain attention to basic, structured task for 60 seconds with Max cues SLP Short Term Goal 2 (Week 3): Pt will follow one-step commands with 80% accuracy with Mod cues SLP Short Term Goal 3 (Week 3): Pt will answer basic yes/no questions with 80% accuracy with Max cues SLP Short Term Goal 4 (Week 3): Pt will demonstrate orientation x4 with Supervision SLP Short Term Goal 5 (Week 3): Pt will name common objects with 80% accuracy with Mod cues  Weekly Progress Updates: Pt has met 1 out of 5 goals during this reporting period. He requires Max cues for sustained attention in <30 second intervals, which impacts higher level of cognitive function. He requires Max cues for basic commands and naming. He has exhibited increased alertness and orientation x3. Pt/family education with wife is ongoing. Pt will benefit from continued SLP services to maximize cognitive function and reduce burden of care.   SLP Intensity: Minumum of  1-2 x/day, 30 to 90 minutes SLP Frequency: 5 out of 7 days SLP Duration/Estimated Length of Stay: 12-15 days SLP Treatment/Interventions: Cognitive remediation/compensation;Cueing hierarchy;Environmental controls;Functional tasks;Internal/external aids;Patient/family education;Speech/Language facilitation   Maxcine Ham, M.A. CCC-SLP [phone]   Maxcine Ham 03/30/2013, 10:29 AM

## 2013-03-30 NOTE — Progress Notes (Addendum)
77 y.o. right-handed male with history of hypertension, CAD with PTCA. Admit 03/01/2013 with altered mental status and fever. Recent admission to [facility] with hemorrhagic stroke and discharged 02/27/2013 after a three-day hospital stay. Presented with increased headache and nausea as well as hallucinations and altered mental status. Cranial CT scan showed right temporal occipital parenchymal hematoma measuring up to 7 cm in light of trace intraventricular extension without hydrocephalus. Patient was placed on vancomycin /Zosyn for fever of 100.3 white blood cell count 15,000. An MRI was completed 03/05/2013 during workup of fever rule out abscess that showed a large subacute hematoma right temporoparietal lobe as well small areas of acute infarct in the right occipital parietal lobe and left temporal parietal lobe. Neurosurgery Dr. Danielle Dess consulted advise conservative care in reference of right parietal ICH. TEE completed showing no thrombus without PFO no vegetation. Infectious disease consult and for fever of unknown origin with workup ongoing including CT of the chest 03/08/2013 showed bilateral lobar and segmental pulmonary emboli and bilateral lower extremity Doppler studies consistent with deep vein thrombosis involving the right common femoral vein and right peroneal vein as well as deep vein thrombosis left peroneal vein. Underwent removable Greenfield filter 03/08/2012 per interventional radiology. Depakote was added for headache as well as seizure prevention. Bouts of urinary retention Foley catheter tube inserted x2 Subjective/Complaints: More aware, eyes open, slept on and off per son yesterday Review of Systems - poor awareness of deficits  Objective: Vital Signs: Blood pressure 115/64, pulse 68, temperature 98.2 F (36.8 C), temperature source Oral, resp. rate 18, height 5\' 8"  (1.727 m), weight 75.297 kg (166 lb), SpO2 95.00%. No results found. No results found for this or any  previous visit (from the past 72 hour(s)).   HEENT: normal and oral mucosa moist Cardio: RRR and no murmur Resp: CTA B/L and unlabored GI: BS positive and non tender Extremity:  Pulses positive and No Edema Skin:  Gr 2-3 decub sacrum Neuro: Awakens to verbal and tactile stim, Cranial Nerve II-XII normal, Abnormal Sensory Difficulty cooperating with sensory exam. Poor attention to task, Normal Motor, Abnormal FMC Ataxic/ dec FMC and Other Able to spell world forward but not backwards Musc/Skel:  Normal General no acute distress   Assessment/Plan: 1. Functional deficits secondary to R temporal/occipital IPH as well as acute infarcts in R occipital/parietal and L temporal/ prieateal which require 3+ hours per day of interdisciplinary therapy in a comprehensive inpatient rehab setting. Physiatrist is providing close team supervision and 24 hour management of active medical problems listed below. Physiatrist and rehab team continue to assess barriers to discharge/monitor patient progress toward functional and medical goals.   FIM: FIM - Bathing Bathing Steps Patient Completed: Chest;Right Arm;Left Arm;Abdomen;Front perineal area;Buttocks;Right upper leg;Left upper leg Bathing: 4: Min-Patient completes 8-9 52f 10 parts or 75+ percent  FIM - Upper Body Dressing/Undressing Upper body dressing/undressing steps patient completed: Put head through opening of pull over shirt/dress;Pull shirt over trunk Upper body dressing/undressing: 3: Mod-Patient completed 50-74% of tasks FIM - Lower Body Dressing/Undressing Lower body dressing/undressing steps patient completed: Pull underwear up/down;Pull pants up/down;Thread/unthread left underwear leg;Thread/unthread left pants leg Lower body dressing/undressing: 3: Mod-Patient completed 50-74% of tasks  FIM - Toileting Toileting steps completed by patient: Performs perineal hygiene Toileting Assistive Devices: Grab bar or rail for support Toileting: 2:  Max-Patient completed 1 of 3 steps  FIM - Diplomatic Services operational officer Devices: Grab bars;Walker Toilet Transfers: 3-From toilet/BSC: Mod A (lift or lower assist);3-To toilet/BSC: Mod  A (lift or lower assist)  FIM - Bed/Chair Transfer Bed/Chair Transfer Assistive Devices: Arm rests;Bed rails;HOB elevated Bed/Chair Transfer: 5: Supine > Sit: Supervision (verbal cues/safety issues);4: Bed > Chair or W/C: Min A (steadying Pt. > 75%);4: Chair or W/C > Bed: Min A (steadying Pt. > 75%)  FIM - Locomotion: Wheelchair Distance: 70 Locomotion: Wheelchair: 2: Travels 50 - 149 ft with minimal assistance (Pt.>75%) FIM - Locomotion: Ambulation Locomotion: Ambulation Assistive Devices: Other (comment) (L HHA) Ambulation/Gait Assistance: 1: +2 Total assist (for w/c follow) Locomotion: Ambulation: 1: Two helpers  Comprehension Comprehension Mode: Auditory Comprehension: 3-Understands basic 50 - 74% of the time/requires cueing 25 - 50%  of the time  Expression Expression Mode: Verbal Expression: 3-Expresses basic 50 - 74% of the time/requires cueing 25 - 50% of the time. Needs to repeat parts of sentences.  Social Interaction Social Interaction: 3-Interacts appropriately 50 - 74% of the time - May be physically or verbally inappropriate.  Problem Solving Problem Solving: 2-Solves basic 25 - 49% of the time - needs direction more than half the time to initiate, plan or complete simple activities  Memory Memory: 1-Recognizes or recalls less than 25% of the time/requires cueing greater than 75% of the time  Medical Problem List and Plan:  1. Right temporal parietal intracranial hemorrhage as well as right occipital infarct mental status slowly improving but has waxing /waning as expected 2. DVT Prophylaxis/Anticoagulation: Pulmonary emboli/DVT right common femoral vein and peroneal vein, left peroneal vein. 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  Pain Management/headaches. Depakote 750 mg daily  4. Neuropsych: This patient is capable of making decisions on his own behalf.  5. ID. All antibiotics discontinued 03/08/2013 with workup unremarkable except for signs of DVT /PE , no source identified, WBCs decreasing afeb, repeat UA C and S neg,  Discussed with ID consultant who rec cont monitoring off acetaminophen 6. Hypertension. Normotensive off Norvasc 5 mg daily,still taking  Lopressor 50 mg twice a day. Monitor with increased mobility  7. Hyperlipidemia. Lipitor  8. BPH. Flomax.  ---foley removal 12/8, voiding trial still requiring caths, occ void 9. CAD/PTCA. Aspirin prior to admission discontinued secondary to hemorrhage. No chest pain or shortness of breath  10. Urinary retention. Voiding trial going well, voided up to 375 but PVR still elevated, rec stand to void with assist  -UCx negative 11. Insomnia/night time restlessness--reduce seroquel tonight 25-50mg , more alert this am 12.  Edema BLE no pain or redness,has bilateral DVTs and IVC filter  albumin is mildly reduced, ECHO 03/07/2013, "normal EF, no sig valvular disease, hold norvasc, Lasix not helping will d/c, use TEDs 13.  Sacral decub, sidelying , Foam dressing, sleeping better on air mattress  LOS (Days) 15 A FACE TO FACE EVALUATION WAS PERFORMED  KIRSTEINS,ANDREW E 03/30/2013, 7:25 AM

## 2013-03-31 ENCOUNTER — Inpatient Hospital Stay (HOSPITAL_COMMUNITY): Payer: Medicare Other

## 2013-03-31 ENCOUNTER — Inpatient Hospital Stay (HOSPITAL_COMMUNITY): Payer: Medicare Other | Admitting: Physical Therapy

## 2013-03-31 DIAGNOSIS — I2510 Atherosclerotic heart disease of native coronary artery without angina pectoris: Secondary | ICD-10-CM

## 2013-03-31 DIAGNOSIS — I619 Nontraumatic intracerebral hemorrhage, unspecified: Secondary | ICD-10-CM

## 2013-03-31 DIAGNOSIS — I10 Essential (primary) hypertension: Secondary | ICD-10-CM

## 2013-03-31 DIAGNOSIS — R209 Unspecified disturbances of skin sensation: Secondary | ICD-10-CM

## 2013-03-31 DIAGNOSIS — I69998 Other sequelae following unspecified cerebrovascular disease: Secondary | ICD-10-CM

## 2013-03-31 MED ORDER — DIVALPROEX SODIUM ER 500 MG PO TB24
500.0000 mg | ORAL_TABLET | Freq: Every day | ORAL | Status: DC
  Administered 2013-03-31 – 2013-04-06 (×7): 500 mg via ORAL
  Filled 2013-03-31 (×8): qty 1

## 2013-03-31 MED ORDER — QUETIAPINE FUMARATE 25 MG PO TABS
25.0000 mg | ORAL_TABLET | Freq: Every day | ORAL | Status: DC
  Administered 2013-03-31 – 2013-04-05 (×6): 25 mg via ORAL
  Filled 2013-03-31 (×7): qty 1

## 2013-03-31 MED ORDER — MUSCLE RUB 10-15 % EX CREA
TOPICAL_CREAM | CUTANEOUS | Status: DC | PRN
  Administered 2013-04-01 – 2013-04-03 (×3): via TOPICAL
  Filled 2013-03-31: qty 85

## 2013-03-31 NOTE — Progress Notes (Signed)
At 2230 pt voided 100cc, PVR was 450cc. I&O cath for 650cc. At 0050 pt voided 300cc. At 0350 pt voided 450cc, PVR was 385. Pt attempted to double void without success. I&O cath for 300cc. Mick Sell RN

## 2013-03-31 NOTE — Progress Notes (Signed)
77 y.o. right-handed male with history of hypertension, CAD with PTCA. Admit 03/01/2013 with altered mental status and fever. Recent admission to [facility] with hemorrhagic stroke and discharged 02/27/2013 after a three-day hospital stay. Presented with increased headache and nausea as well as hallucinations and altered mental status. Cranial CT scan showed right temporal occipital parenchymal hematoma measuring up to 7 cm in light of trace intraventricular extension without hydrocephalus. Patient was placed on vancomycin /Zosyn for fever of 100.3 white blood cell count 15,000. An MRI was completed 03/05/2013 during workup of fever rule out abscess that showed a large subacute hematoma right temporoparietal lobe as well small areas of acute infarct in the right occipital parietal lobe and left temporal parietal lobe. Neurosurgery Dr. Danielle Dess consulted advise conservative care in reference of right parietal ICH. TEE completed showing no thrombus without PFO no vegetation. Infectious disease consult and for fever of unknown origin with workup ongoing including CT of the chest 03/08/2013 showed bilateral lobar and segmental pulmonary emboli and bilateral lower extremity Doppler studies consistent with deep vein thrombosis involving the right common femoral vein and right peroneal vein as well as deep vein thrombosis left peroneal vein. Underwent removable Greenfield filter 03/08/2012 per interventional radiology. Depakote was added for headache as well as seizure prevention. Bouts of urinary retention Foley catheter tube inserted x2 Subjective/Complaints: Still confused, c/o neck pain no trauma no UE radiation Review of Systems - poor awareness of deficits  Objective: Vital Signs: Blood pressure 117/67, pulse 68, temperature 97.6 F (36.4 C), temperature source Oral, resp. rate 17, height 5\' 8"  (1.727 m), weight 81.647 kg (180 lb), SpO2 97.00%. No results found. No results found for this or any  previous visit (from the past 72 hour(s)).   HEENT: normal and oral mucosa moist Cardio: RRR and no murmur Resp: CTA B/L and unlabored GI: BS positive and non tender Extremity:  Pulses positive and No Edema Skin:  Gr 2-3 decub sacrum Neuro: Awakens to verbal and tactile stim, Cranial Nerve II-XII normal, Abnormal Sensory Difficulty cooperating with sensory exam. Poor attention to task, Normal Motor, Abnormal FMC Ataxic/ dec FMC and Other Able to spell world forward but not backwards Musc/Skel:  Normal General no acute distress   Assessment/Plan: 1. Functional deficits secondary to R temporal/occipital IPH as well as acute infarcts in R occipital/parietal and L temporal/ prieateal which require 3+ hours per day of interdisciplinary therapy in a comprehensive inpatient rehab setting. Physiatrist is providing close team supervision and 24 hour management of active medical problems listed below. Physiatrist and rehab team continue to assess barriers to discharge/monitor patient progress toward functional and medical goals.   FIM: FIM - Bathing Bathing Steps Patient Completed: Chest;Right Arm;Left Arm;Abdomen;Front perineal area;Right upper leg;Left upper leg;Right lower leg (including foot);Left lower leg (including foot) Bathing: 4: Min-Patient completes 8-9 59f 10 parts or 75+ percent  FIM - Upper Body Dressing/Undressing Upper body dressing/undressing steps patient completed: Put head through opening of pull over shirt/dress;Pull shirt over trunk;Thread/unthread right sleeve of pullover shirt/dresss;Thread/unthread left sleeve of pullover shirt/dress Upper body dressing/undressing: 3: Mod-Patient completed 50-74% of tasks FIM - Lower Body Dressing/Undressing Lower body dressing/undressing steps patient completed: Pull underwear up/down;Pull pants up/down;Thread/unthread left pants leg;Thread/unthread right pants leg;Thread/unthread right underwear leg;Don/Doff right shoe;Don/Doff left  shoe Lower body dressing/undressing: 3: Mod-Patient completed 50-74% of tasks  FIM - Toileting Toileting steps completed by patient: Performs perineal hygiene Toileting Assistive Devices: Grab bar or rail for support Toileting: 2: Max-Patient completed 1 of 3  steps  FIM - Diplomatic Services operational officer Devices: Grab bars Toilet Transfers: 3-To toilet/BSC: Mod A (lift or lower assist);3-From toilet/BSC: Mod A (lift or lower assist)  FIM - Bed/Chair Transfer Bed/Chair Transfer Assistive Devices: Arm rests;HOB elevated Bed/Chair Transfer: 4: Bed > Chair or W/C: Min A (steadying Pt. > 75%);4: Chair or W/C > Bed: Min A (steadying Pt. > 75%);4: Supine > Sit: Min A (steadying Pt. > 75%/lift 1 leg)  FIM - Locomotion: Wheelchair Distance: 150 Locomotion: Wheelchair: 4: Travels 150 ft or more: maneuvers on rugs and over door sillls with minimal assistance (Pt.>75%) FIM - Locomotion: Ambulation Locomotion: Ambulation Assistive Devices: Other (comment) (L HHA) Ambulation/Gait Assistance: 4: Min assist Locomotion: Ambulation: 1: Travels less than 50 ft with minimal assistance (Pt.>75%)  Comprehension Comprehension Mode: Auditory Comprehension: 2-Understands basic 25 - 49% of the time/requires cueing 51 - 75% of the time  Expression Expression Mode: Verbal Expression: 3-Expresses basic 50 - 74% of the time/requires cueing 25 - 50% of the time. Needs to repeat parts of sentences.  Social Interaction Social Interaction: 2-Interacts appropriately 25 - 49% of time - Needs frequent redirection.  Problem Solving Problem Solving: 2-Solves basic 25 - 49% of the time - needs direction more than half the time to initiate, plan or complete simple activities  Memory Memory: 1-Recognizes or recalls less than 25% of the time/requires cueing greater than 75% of the time  Medical Problem List and Plan:  1. Right temporal parietal intracranial hemorrhage as well as right occipital infarct  mental status slowly improving but has waxing /waning as expected 2. DVT Prophylaxis/Anticoagulation: Pulmonary emboli/DVT right common femoral vein and peroneal vein, left peroneal vein. 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 Pain Management/headaches. Depakote 750 mg daily, will reduce dose and monitor, analgesic reme and heat for neck pain 4. Neuropsych: This patient is capable of making decisions on his own behalf.  5. ID. All antibiotics discontinued 03/08/2013 with workup unremarkable except for signs of DVT /PE , no source identified, WBCs decreasing , repeat UA C and S neg,  Discussed with ID consultant who rec cont monitoring off acetaminophen, afeb off tylenol 6. Hypertension. Normotensive off Norvasc 5 mg daily,still taking  Lopressor 50 mg twice a day. Monitor with increased mobility  7. Hyperlipidemia. Lipitor  8. BPH. Flomax.  ---foley removal 12/8, voiding trial still requiring caths, occ void 9. CAD/PTCA. Aspirin prior to admission discontinued secondary to hemorrhage. No chest pain or shortness of breath  10. Urinary retention. Voiding trial going well, voided up to 375  PVR still elevated but decreasing freq and volumes rec stand to void with assist  -UCx negative 11. Insomnia/night time restlessness--reduce seroquel tonight 25-50mg , more alert this am 12.  Edema BLE no pain or redness,has bilateral DVTs and IVC filter  albumin is mildly reduced, ECHO 03/07/2013, "normal EF, no sig valvular disease, hold norvasc, Lasix not helping will d/c, use TEDs 13.  Sacral decub, sidelying , Foam dressing, sleeping better on air mattress  LOS (Days) 16 A FACE TO FACE EVALUATION WAS PERFORMED  Dannetta Lekas E 03/31/2013, 6:58 AM

## 2013-03-31 NOTE — Progress Notes (Signed)
Occupational Therapy Weekly Progress Note  Patient Details  Name: Walter Larsen MRN: [id number] Date of Birth: 05-24-1935  Today's Date: 03/31/2013 Time:  0930-1000 and 1100-1200 Time calculation (min): 30 min and 60 min     Patient has met 4 of 4 short term goals.  Patient has been progressing well during past several days of therapy therefore extending discharge to increase goals to supervision. Since last weekly note, barriers to progress (orhtostatic hypotension, decreased arousal, and pain) have improved significantly. Patient now demonstrates improved attention, awareness, and initiation allowing him to increase independence with self-care tasks.   Patient continues to demonstrate the following deficits: decreased attention, decreased awareness, decreased functional endurance, decreased strength, decreased postural control in standing, decreased dynamic standing balance, decreased ability to compensate for deficits and therefore will continue to benefit from skilled OT intervention to enhance overall performance with BADL and ability to compensate for deficits, activity tolerance, visual deficits, cognitive deficits, strength, balance.  Patient is slowly progressing toward long-term goals. Goals increased to supervision level secondary to patient's increase in functional skills this past week. .  Continue plan of care.  OT Short Term Goals Week 2:  OT Short Term Goal 1 (Week 2): Pt demonstrate sustained attention to self-care task without cues OT Short Term Goal 1 - Progress (Week 2): Met OT Short Term Goal 2 (Week 2): Pt will consistently don shirt with min assist OT Short Term Goal 2 - Progress (Week 2): Met OT Short Term Goal 3 (Week 2): Pt will complete toilet transfer with min assist OT Short Term Goal 3 - Progress (Week 2): Met OT Short Term Goal 4 (Week 2): Pt remain alert with eyes open appox 25% of therapy session with mod cues OT Short Term Goal 4 - Progress (Week 2): Met Week  3:  OT Short Term Goal 1 (Week 3): Focus on LTGs  Skilled Therapeutic Interventions/Progress Updates:    Session 1: Pt received supine in bed requesting to complete toilet task. Ambulated HHA bed>toilet with min assist and mod cues. Pt had successful BM and voided. Pt required steadying assist for clothing management and assist for hygiene for thoroughness. Pt then ambulated back to bed with HHA and min assist. While sitting EOB provided stretching to cervical region as pt having pain and decreased ROM. Pt reporting stretching was a "good pain" and slight increase in range noted. Discussed with wife cueing pt to turn head left/right and up/down while in room. Pt left supine in bed with all needs in reach and RN notified of void and BM. While pt completing toilet task, therapist discussed discharge planning with wife. Discussed having grab bars installed in shower and placing a non-slip mat in the tub. She informed therapist that her son would get this done prior to discharge home. She reported their granddaughter will be over daily to assist whenever needed for the first 2-3 weeks the patient is home. She is very excited about his progress at this time.    Session 2: Pt seen for ADL retraining with focus on functional transfers, sequencing/organization, and activity tolerance. Completed bathing in ADL apartment tub shower. Pt required max cues for and min assist for tub transfer. Completed bathing with steadying assist and min verbal cues to wash all body parts. During dressing, pt oriented shirt for first time and donned without cues. Required min cues for sequencing/organization during LB dressing and rest breaks d/t fatigue. Required supervision for standing balance and sit<>stand during LB dressing. At end of session  pt returned to room and left sitting in w/c with all needs in reach and quick release belt donned.   Therapy Documentation Precautions:  Precautions Precautions: Fall Precaution  Comments: low blood pressure, becomes dizzy Restrictions Weight Bearing Restrictions: No General:   Vital Signs: Therapy Vitals Temp: 97.6 F (36.4 C) Temp src: Oral Pulse Rate: 68 Resp: 17 BP: 117/67 mmHg Oxygen Therapy SpO2: 97 % O2 Device: None (Room air) Pain:   ADL:   Exercises:   Other Treatments:    See FIM for current functional status  Therapy/Group: Individual Therapy  Daneil Dan 03/31/2013, 7:29 AM

## 2013-03-31 NOTE — Plan of Care (Signed)
Problem: RH BLADDER ELIMINATION Goal: RH STG MANAGE BLADDER WITH ASSISTANCE STG Manage Bladder With min Assistance with voiding, if home with indwelling catheter min assist for maintaince by patient and/or caregiver  Outcome: Progressing Pt is voiding larger amounts(300+), however still with PVR >350 at times requiring I&O cath.

## 2013-03-31 NOTE — Progress Notes (Signed)
Speech Language Pathology Daily Session Note  Patient Details  Name: Walter Larsen MRN: [id number] Date of Birth: [birth year]  Today's Date: 03/31/2013 Time: 1415-1500 Time Calculation (min): 45 min  Short Term Goals: Week 3: SLP Short Term Goal 1 (Week 3): Pt will sustain attention to basic, structured task for 60 seconds with Max cues SLP Short Term Goal 2 (Week 3): Pt will follow one-step commands with 80% accuracy with Mod cues SLP Short Term Goal 3 (Week 3): Pt will answer basic yes/no questions with 80% accuracy with Max cues SLP Short Term Goal 4 (Week 3): Pt will demonstrate orientation x4 with Supervision SLP Short Term Goal 5 (Week 3): Pt will name common objects with 80% accuracy with Mod cues  Skilled Therapeutic Interventions: Treatment focused on cognitive goals. SLP facilitated session with Min cues for mildly complex yes/no questions. Pt answered basic yes/no questions with 100% accuracy with Mod I. He named common objects with 100% accuracy with supervision level cue x1 due to perseverative error. Pt was oriented to person and situation, and required choice of three to orient to location. Pt sorted two types of coins with Max cues for sustained attention and Mod cues for accuracy.   FIM:  Comprehension Comprehension Mode: Auditory Comprehension: 2-Understands basic 25 - 49% of the time/requires cueing 51 - 75% of the time Expression Expression Mode: Verbal Expression: 3-Expresses basic 50 - 74% of the time/requires cueing 25 - 50% of the time. Needs to repeat parts of sentences. Social Interaction Social Interaction: 2-Interacts appropriately 25 - 49% of time - Needs frequent redirection. Problem Solving Problem Solving: 2-Solves basic 25 - 49% of the time - needs direction more than half the time to initiate, plan or complete simple activities Memory Memory: 1-Recognizes or recalls less than 25% of the time/requires cueing greater than 75% of the time  Pain Pain  Assessment Pain Assessment: No/denies pain  Therapy/Group: Individual Therapy   Maxcine Ham, M.A. CCC-SLP [phone]   Maxcine Ham 03/31/2013, 4:48 PM

## 2013-03-31 NOTE — Progress Notes (Signed)
Physical Therapy Note  Patient Details  Name: [name] MRN: [id number] Date of Birth: 04-30-1935 Today's Date: 03/31/2013  Time: 1300-1354 54 minutes  1:1 Pt with c/o neck pain with movement, eases with rest and repositioning.  Treatment session focused on activity tolerance and gait training.  Multiple times sit to stand from various surfaces (w/c, toilet, recliner, mat) with steadying assist.  Short distance gait multiple attempts up to 30' with min A, no device.  Attempted RW for energy conservation but pt unsafe with RW, continues to require min A.  Nu step for activity tolerance x 5 minutes, pt only able to perform 10 steps per minute with mod-max cuing.  Pt requires frequent rest breaks and max cuing for sustained attention throughout session.   [name] 03/31/2013, 1:54 PM

## 2013-04-01 ENCOUNTER — Inpatient Hospital Stay (HOSPITAL_COMMUNITY): Payer: Medicare Other

## 2013-04-01 ENCOUNTER — Inpatient Hospital Stay (HOSPITAL_COMMUNITY): Payer: Medicare Other | Admitting: Physical Therapy

## 2013-04-01 MED ORDER — METOPROLOL TARTRATE 50 MG PO TABS
50.0000 mg | ORAL_TABLET | Freq: Two times a day (BID) | ORAL | Status: DC
  Administered 2013-04-02 – 2013-04-06 (×8): 50 mg via ORAL
  Filled 2013-04-01 (×11): qty 1

## 2013-04-01 NOTE — Progress Notes (Signed)
77 y.o. right-handed male with history of hypertension, CAD with PTCA. Admit 03/01/2013 with altered mental status and fever. Recent admission to [facility] with hemorrhagic stroke and discharged 02/27/2013 after a three-day hospital stay. Presented with increased headache and nausea as well as hallucinations and altered mental status. Cranial CT scan showed right temporal occipital parenchymal hematoma measuring up to 7 cm in light of trace intraventricular extension without hydrocephalus. Patient was placed on vancomycin /Zosyn for fever of 100.3 white blood cell count 15,000. An MRI was completed 03/05/2013 during workup of fever rule out abscess that showed a large subacute hematoma right temporoparietal lobe as well small areas of acute infarct in the right occipital parietal lobe and left temporal parietal lobe. Neurosurgery Dr. Danielle Dess consulted advise conservative care in reference of right parietal ICH. TEE completed showing no thrombus without PFO no vegetation. Infectious disease consult and for fever of unknown origin with workup ongoing including CT of the chest 03/08/2013 showed bilateral lobar and segmental pulmonary emboli and bilateral lower extremity Doppler studies consistent with deep vein thrombosis involving the right common femoral vein and right peroneal vein as well as deep vein thrombosis left peroneal vein. Underwent removable Greenfield filter 03/08/2012 per interventional radiology. Depakote was added for headache as well as seizure prevention. Subjective/Complaints: Still confused, c/o neck pain no trauma no UE radiation On reduced Depakote, no HA Review of Systems - poor awareness of deficits  Objective: Vital Signs: Blood pressure 116/65, pulse 70, temperature 98.1 F (36.7 C), temperature source Oral, resp. rate 18, height 5\' 8"  (1.727 m), weight 81.647 kg (180 lb), SpO2 94.00%. No results found. No results found for this or any previous visit (from the past 72  hour(s)).   HEENT: normal and oral mucosa dry Cardio: RRR and no murmur Resp: CTA B/L and unlabored GI: BS positive and non tender Extremity:  Pulses positive and 2+ pretibial Edema Skin: decub sacrum Neuro: Awakens to voice, Cranial Nerve II-XII normal, Abnormal Sensory  Poor attention to task, Normal Motor, Abnormal FMC Ataxic/ dec FMC and Other Musc/Skel:  Normal General no acute distress Mood and Affect brighter this am  Assessment/Plan: 1. Functional deficits secondary to R temporal/occipital IPH as well as acute infarcts in R occipital/parietal and L temporal/ prieateal which require 3+ hours per day of interdisciplinary therapy in a comprehensive inpatient rehab setting. Physiatrist is providing close team supervision and 24 hour management of active medical problems listed below. Physiatrist and rehab team continue to assess barriers to discharge/monitor patient progress toward functional and medical goals.   FIM: FIM - Bathing Bathing Steps Patient Completed: Chest;Right Arm;Left Arm;Abdomen;Front perineal area;Left upper leg;Right lower leg (including foot);Left lower leg (including foot);Buttocks;Right upper leg Bathing: 4: Steadying assist  FIM - Upper Body Dressing/Undressing Upper body dressing/undressing steps patient completed: Put head through opening of pull over shirt/dress;Pull shirt over trunk;Thread/unthread right sleeve of pullover shirt/dresss;Thread/unthread left sleeve of pullover shirt/dress Upper body dressing/undressing: 5: Set-up assist to: Obtain clothing/put away FIM - Lower Body Dressing/Undressing Lower body dressing/undressing steps patient completed: Pull underwear up/down;Pull pants up/down;Thread/unthread left pants leg;Thread/unthread right pants leg;Thread/unthread right underwear leg;Thread/unthread left underwear leg;Don/Doff right sock;Don/Doff left sock Lower body dressing/undressing: 5: Set-up assist to: Don/Doff TED stocking  FIM -  Toileting Toileting steps completed by patient: Adjust clothing prior to toileting;Adjust clothing after toileting Toileting Assistive Devices: Grab bar or rail for support Toileting: 3: Mod-Patient completed 2 of 3 steps  FIM - Diplomatic Services operational officer Devices: Therapist, music  Transfers: 4-To toilet/BSC: Min A (steadying Pt. > 75%);4-From toilet/BSC: Min A (steadying Pt. > 75%)  FIM - Bed/Chair Transfer Bed/Chair Transfer Assistive Devices: Arm rests;HOB elevated Bed/Chair Transfer: 4: Bed > Chair or W/C: Min A (steadying Pt. > 75%);4: Chair or W/C > Bed: Min A (steadying Pt. > 75%);4: Supine > Sit: Min A (steadying Pt. > 75%/lift 1 leg)  FIM - Locomotion: Wheelchair Distance: 150 Locomotion: Wheelchair: 4: Travels 150 ft or more: maneuvers on rugs and over door sillls with minimal assistance (Pt.>75%) FIM - Locomotion: Ambulation Locomotion: Ambulation Assistive Devices: Other (comment) (L HHA) Ambulation/Gait Assistance: 4: Min assist Locomotion: Ambulation: 1: Travels less than 50 ft with minimal assistance (Pt.>75%)  Comprehension Comprehension Mode: Auditory Comprehension: 2-Understands basic 25 - 49% of the time/requires cueing 51 - 75% of the time  Expression Expression Mode: Verbal Expression: 3-Expresses basic 50 - 74% of the time/requires cueing 25 - 50% of the time. Needs to repeat parts of sentences.  Social Interaction Social Interaction: 2-Interacts appropriately 25 - 49% of time - Needs frequent redirection.  Problem Solving Problem Solving: 2-Solves basic 25 - 49% of the time - needs direction more than half the time to initiate, plan or complete simple activities  Memory Memory: 1-Recognizes or recalls less than 25% of the time/requires cueing greater than 75% of the time  Medical Problem List and Plan:  1. Right temporal parietal intracranial hemorrhage as well as right occipital infarct mental status slowly improving but has waxing  /waning as expected 2. DVT Prophylaxis/Anticoagulation: Pulmonary emboli/DVT right common femoral vein and peroneal vein, left peroneal vein. 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 Pain Management/headaches. Depakote 500 mg daily, may be able to taper further prior to D/C, analgesic reme and heat for neck pain 4. Neuropsych: This patient is capable of making decisions on his own behalf.  5. ID. All antibiotics discontinued 03/08/2013 with workup unremarkable except for signs of DVT /PE , no source identified, WBCs decreasing , repeat UA C and S neg,  Discussed with ID consultant who rec cont monitoring off acetaminophen, afeb off tylenol 6. Hypertension. Normotensive off Norvasc 5 mg daily,still taking  Lopressor 50 mg twice a day. Monitor with increased mobility  7. Hyperlipidemia. Lipitor  8. BPH. Flomax.  ---foley removal 12/8, voiding trial still requiring caths, occ void 9. CAD/PTCA. Aspirin prior to admission discontinued secondary to hemorrhage. No chest pain or shortness of breath  10. Urinary retention. Voiding trial going well, nop caths overnite -UCx negative 11. Insomnia/night time restlessness--reduce seroquel tonight 25 more alert this am 12.  Edema BLE no pain or redness,has bilateral DVTs and IVC filter  albumin is mildly reduced, ECHO 03/07/2013, "normal EF, no sig valvular disease, hold norvasc, Lasix not helping will d/c, use TEDs 13.  Sacral decub, sidelying , Foam dressing, sleeping better on air mattress  LOS (Days) 17 A FACE TO FACE EVALUATION WAS PERFORMED  Miracle Criado E 04/01/2013, 6:47 AM

## 2013-04-01 NOTE — Progress Notes (Signed)
Occupational Therapy Session Note  Patient Details  Name: Walter Larsen MRN: [id number] Date of Birth: Apr 21, 1935  Today's Date: 04/01/2013 Time: 1100-1150 Time Calculation (min): 50 min  Short Term Goals: Week 3:  OT Short Term Goal 1 (Week 3): Focus on LTGs  Skilled Therapeutic Interventions/Progress Updates:    Pt seen for ADL retraining with focus on sequencing of tasks, dynamic standing balance, and functional transfers. Completed bathing in tub shower at ADL apartment. Min verbal cues and min assist for tub transfer. Pt washed all body parts without cues and min assist for standing balance. Completed dressing from w/c level. Pt initially donned shirt backwards and unable to self-correct then donned correctly after therapist cued him to locate tag. Required increased time for LB dressing d/t fatigue and pain at BLE. Pt uses crossover technique for threading BLE into pants and min cues provided during this task. Pt required min assist for sit<>stand then managed undergarments and pants around waist with min guard-supervision. Pt very fatigued throughout session and requesting to return to bed. Therapist assist with min assist stand pivot and mod assist sit>supine. Pt asleep immediately. Therapist discussed with son about installing a grab bar in shower and he agreed to do this prior to discharge home.   Therapy Documentation Precautions:  Precautions Precautions: Fall Precaution Comments: low blood pressure, becomes dizzy Restrictions Weight Bearing Restrictions: No General: General Amount of Missed OT Time (min): 10 Minutes Vital Signs:   Pain: Pt with report of pain at neck during minimal head movement. Provided passive stretching and massage. RN aware.   See FIM for current functional status  Therapy/Group: Individual Therapy  Daneil Dan 04/01/2013, 12:10 PM

## 2013-04-01 NOTE — Progress Notes (Signed)
Speech Language Pathology Daily Session Note  Patient Details  Name: Walter Larsen MRN: [id number] Date of Birth: 1935-10-22  Today's Date: 04/01/2013 Time: 1000-1100 Time Calculation (min): 60 min  Short Term Goals: Week 3: SLP Short Term Goal 1 (Week 3): Pt will sustain attention to basic, structured task for 60 seconds with Max cues SLP Short Term Goal 2 (Week 3): Pt will follow one-step commands with 80% accuracy with Mod cues SLP Short Term Goal 3 (Week 3): Pt will answer basic yes/no questions with 80% accuracy with Max cues SLP Short Term Goal 4 (Week 3): Pt will demonstrate orientation x4 with Supervision SLP Short Term Goal 5 (Week 3): Pt will name common objects with 80% accuracy with Mod cues  Skilled Therapeutic Interventions: Treatment focused on cognitive goals. SLP facilitated session with Mod cues for orientation to location and Max cues for orientation to time using monthly calendar. Pt was able to read and verbalize understanding of sign SLP posted in room to facilitate orientation to location ("I am at [facility]"). He demonstrated intellectual awareness of cognitive and physical impairments with supervision level question cues. Pt wrote his name with Mod-Max cues due to perseverative errors. He sustained attention to structured cognitive task with Mod-Max cues in 4 minute intervals. Continue plan of care.   FIM:  Comprehension Comprehension Mode: Auditory Comprehension: 2-Understands basic 25 - 49% of the time/requires cueing 51 - 75% of the time Expression Expression Mode: Verbal Expression: 3-Expresses basic 50 - 74% of the time/requires cueing 25 - 50% of the time. Needs to repeat parts of sentences. Social Interaction Social Interaction: 2-Interacts appropriately 25 - 49% of time - Needs frequent redirection. Problem Solving Problem Solving: 2-Solves basic 25 - 49% of the time - needs direction more than half the time to initiate, plan or complete simple  activities Memory Memory: 1-Recognizes or recalls less than 25% of the time/requires cueing greater than 75% of the time  Pain Pain Assessment Pain Assessment: No/denies pain Pain Score: 0-No pain Patients Stated Pain Goal: 3 Multiple Pain Sites: No  Therapy/Group: Individual Therapy   Maxcine Ham, M.A. CCC-SLP [id number]   Maxcine Ham 04/01/2013, 11:25 AM

## 2013-04-01 NOTE — Plan of Care (Signed)
Problem: RH BLADDER ELIMINATION Goal: RH STG MANAGE BLADDER WITH ASSISTANCE STG Manage Bladder With min Assistance with voiding, if home with indwelling catheter min assist for maintaince by patient and/or caregiver  Outcome: Not Progressing Requiring in and out caths to be done due to urinary retention, on flomax

## 2013-04-01 NOTE — Progress Notes (Signed)
Physical Therapy Session Note  Patient Details  Name: Walter Larsen MRN: [id number] Date of Birth: 1936-01-26  Today's Date: 04/01/2013 Time: 0800-0909 and 4098-1191 Time Calculation (min): 69 min and 28 min  Short Term Goals: Week 3:  PT Short Term Goal 1 (Week 3): STG's = LTG's secondary to ELOS  Skilled Therapeutic Interventions/Progress Updates:    Treatment Session 1: Pt received semi-reclined bed with son present; agreeable to session. Oriented x4; utilized visual aid on wall to orient self to date without cueing. Awareness significantly improved during this session, as compared with previous sessions. In supine, assisted pt with donning pants, TED hose, and shoes. Supine>sit with HOB flat, bed rail with no assist, no cueing required to initiate RUE across midline. Sit>stand from bed with min guard. Once in standing, pt performed gait x45' with min A and 3 brief (<10 second) standing rest breaks secondary to pt c/o LE fatigue. No assistive device, hand rail, or HHA required; manual facilitation at R ribs and L axilla to facilitate upright posture. Negotiation of three 6.5" steps with bilat hand rails with min A, reciprocal pattern to ascend, step-to pattern to descend. Therapist explained and demonstrated how to manage w/c leg rests; pt gave effective return demonstration of management of L w/c brake, but required reinforcement for R brake. Performed management of brakes x3 reps on L side, x6 reps on R side during rest break. Self-propulsion of w/c >150' with bilat UE's with supervision, mod verbal cueing to remind pt to decrease verbalization to minimize internal distraction; pt gave effective return demonstration. Once in room, pt requested to use bathroom. Performed stand-pivot from w/c<>toilet with min A, use of grab bars in bathroom. Performed static sitting balance on toilet x4 minutes with single UE support on grab bar with distant supervision. Pt repositioned in w/c with quick release belt  in place for safety, son present, and all needs within reach.  Treatment Session 2: Pt received semi-reclined in bed with son present; awake and agreeable to session. Oriented x3 to person, place, and situation; able to orient self to date with mod verbal cueing for utilization of visual aid on wall. W/c mobility x160' with bilat UE's and supervision, cueing for obstacle negotiation (bilat sides). Gait x22' with R HHA, min A for stability/balance; gait trial ended, per pt request, secondary to LE fatigue. Once repositioned in w/c, pt attempted to locate room concurrently with w/c propulsion. Pt requires mod-max cueing to examine room signs for name and room #. Mod verbal cueing and manual facilitation of head turning required when visual target is at pt's R side. Management of w/c parts (focus on leg rests) performed to assess between-session carryover. Pt recalled ~40-50% of w/c parts management discussed/performed during AM session. Pt repositioned in w/c with quick release belt in place for safety, son present, and all needs within reach.  Therapy Documentation Precautions:  Precautions Precautions: Fall Precaution Comments: low blood pressure, becomes dizzy Restrictions Weight Bearing Restrictions: No Pain: Pain Assessment Pain Assessment: No/denies pain Pain Score: 0-No pain Patients Stated Pain Goal: 3 Multiple Pain Sites: No Locomotion : Ambulation Ambulation/Gait Assistance: 4: Min assist Wheelchair Mobility Distance: 150   See FIM for current functional status  Therapy/Group: Individual Therapy  Alize Borrayo, [name] 04/01/2013, 12:14 PM

## 2013-04-02 ENCOUNTER — Inpatient Hospital Stay (HOSPITAL_COMMUNITY): Payer: Medicare Other | Admitting: Occupational Therapy

## 2013-04-02 ENCOUNTER — Inpatient Hospital Stay (HOSPITAL_COMMUNITY): Payer: Medicare Other

## 2013-04-02 ENCOUNTER — Inpatient Hospital Stay (HOSPITAL_COMMUNITY): Payer: Medicare Other | Admitting: Speech Pathology

## 2013-04-02 NOTE — Progress Notes (Signed)
At 2130 manual BP=98/60, paged Dr.Swords with parameter orders added to scheduled lopressor. Patient without complaint of. Grand daughter at bedside providing hands on assistance. At 1945 patient voided, PVR=416, I & O cath=400cc's. At 2245 voided 275cc's, PVR=14. At 0200 voided 475cc's, PVR 126cc's. Re-check BP=106/62. 2 plus pitting edema to BLE's. Air mattress in use with SR's up X 4. Foam dressing to sacrum. Patient good at turning side to side. Alfredo Martinez A

## 2013-04-02 NOTE — Progress Notes (Signed)
Speech Language Pathology Daily Session Note  Patient Details  Name: Walter Larsen MRN: [id number] Date of Birth: 1935/12/15  Today's Date: 04/02/2013 Time: 1530-1600 Time Calculation (min): 30 min  Short Term Goals: Week 3: SLP Short Term Goal 1 (Week 3): Pt will sustain attention to basic, structured task for 60 seconds with Max cues SLP Short Term Goal 2 (Week 3): Pt will follow one-step commands with 80% accuracy with Mod cues SLP Short Term Goal 3 (Week 3): Pt will answer basic yes/no questions with 80% accuracy with Max cues SLP Short Term Goal 4 (Week 3): Pt will demonstrate orientation x4 with Supervision SLP Short Term Goal 5 (Week 3): Pt will name common objects with 80% accuracy with Mod cues  Skilled Therapeutic Interventions: Therapeutic intervention for cognitive skills complete.  The patient followed 1-step commands with 100% accuracy, was oriented x 4, and named common objects with 100% accuracy as well.  He required min verbal cues for verbally sequencing functional activities. Continue with current treatment plan.    FIM:  Comprehension Comprehension Mode: Auditory Comprehension: 4-Understands basic 75 - 89% of the time/requires cueing 10 - 24% of the time Expression Expression Mode: Verbal Expression: 4-Expresses basic 75 - 89% of the time/requires cueing 10 - 24% of the time. Needs helper to occlude trach/needs to repeat words. Social Interaction Social Interaction: 4-Interacts appropriately 75 - 89% of the time - Needs redirection for appropriate language or to initiate interaction. Problem Solving Problem Solving: 3-Solves basic 50 - 74% of the time/requires cueing 25 - 49% of the time Memory Memory: 2-Recognizes or recalls 25 - 49% of the time/requires cueing 51 - 75% of the time  Pain Pain Assessment Pain Assessment: No/denies pain  Therapy/Group: Individual Therapy  Lenny Pastel 04/02/2013, 4:09 PM

## 2013-04-02 NOTE — Progress Notes (Addendum)
Physical Therapy Note  Patient Details  Name: Kyel Purk MRN: [id number] Date of Birth: Mar 10, 1936 Today's Date: 04/02/2013  No pain reported 1140-1210; 30 min individual therapy tx delayed due to bladder scan  Alert and oriented to person, place.  Mod cues for month, year, day.  Later in session he remembered day, but still not oriented to month or year.  Management of w/c brakes, foot plates  with 1 cue to initiate; hand over hand assist to find legrest mechanism bil.  Supine> sit with supervision, safe technique.  Bed> w/c stand pivot transfer to R with min guard assist.  Pt stated his neck is stiff; L and R rotation quite limited; tender over nuchal tubercles bil, L greater than R.  Discussed placement of K pad with pt and wife, suggesting sidelying to target L or R nuchal tubercle individually.  AROM L and R rotation with visual tracking to target, x 5.    neuromuscular re-education via demo, forced use for w/c propulsion x 150' x 2 using bil UEs, cues for route finding and efficient technique; gait without AD x 40' including turn, min assist, and up/down  5 steps 2 rails, step to pattern .  Mod cues to decrease self talk which distracts pt.  Chalee Hirota 04/02/2013, 12:07 PM

## 2013-04-02 NOTE — Progress Notes (Signed)
Occupational Therapy Session Note  Patient Details  Name: Walter Larsen MRN: [id number] Date of Birth: Apr 22, 1935  Today's Date: 04/02/2013 Time: 1430-1500 Time Calculation (min): 30 min  Short Term Goals: Week 1:  OT Short Term Goal 1 (Week 1): Pt will be able to transfer to toilet with steady assist. OT Short Term Goal 1 - Progress (Week 1): Not met OT Short Term Goal 2 (Week 1): Pt will be able to don pants with min assist. OT Short Term Goal 2 - Progress (Week 1): Met OT Short Term Goal 3 (Week 1): Pt will be able to stand with steadying assist to wash buttocks. OT Short Term Goal 3 - Progress (Week 1): Met OT Short Term Goal 4 (Week 1): Pt will be able to turn head to left with mod cues during self care tasks. OT Short Term Goal 4 - Progress (Week 1): Not met Week 2:  OT Short Term Goal 1 (Week 2): Pt demonstrate sustained attention to self-care task without cues OT Short Term Goal 1 - Progress (Week 2): Met OT Short Term Goal 2 (Week 2): Pt will consistently don shirt with min assist OT Short Term Goal 2 - Progress (Week 2): Met OT Short Term Goal 3 (Week 2): Pt will complete toilet transfer with min assist OT Short Term Goal 3 - Progress (Week 2): Met OT Short Term Goal 4 (Week 2): Pt remain alert with eyes open appox 25% of therapy session with mod cues OT Short Term Goal 4 - Progress (Week 2): Met Week 3:  OT Short Term Goal 1 (Week 3): Focus on LTGs  Skilled Therapeutic Interventions/Progress Updates:  1:1 tx. Pt in bed lying on side with wife at bedside upon entering room. Pt with pants down around hips perseverating on "sore spot" on his bottom and "wanting to get some air on it to help it heal." Pt agreeable to work w/OT on grooming tasks at sink. Pt stand by assist for bed mob sup>sit. Initial c/o dizziness which subsided w/in 1 minute. Pt HHA amb from bed to sink, approx 10 feet away. Steady assist at sink to complete oral care and wash face. Pt requested to sit after 3  minutes in standing. Pt completed rest of grooming tasks from seated position including shaving, brushing hair and cleaning dentures. Pt needed frequent redirection for completion of tasks and location of items on L side throughout tx session. Pt assisted back to bed (min A for LEs only) w/wife at bedside upon leaving room.   Therapy Documentation Precautions:  Precautions Precautions: Fall Precaution Comments: low blood pressure, becomes dizzy Restrictions Weight Bearing Restrictions: No General:   Vital Signs: Therapy Vitals Temp: 97.6 F (36.4 C) Temp src: Oral Pulse Rate: 91 Resp: 18 BP: 118/68 mmHg Patient Position, if appropriate: Lying Oxygen Therapy SpO2: 95 % O2 Device: None (Room air) Pain:  pt c/o 8/10 pain in neck. Heat applied. Wife noted recent administration of pain meds   See FIM for current functional status  Therapy/Group: Individual Therapy  Guy Toney, Deidre Ala 04/02/2013, 2:52 PM

## 2013-04-02 NOTE — Progress Notes (Signed)
Voided 400cc's, PVR=75cc's, No I & O cath needed since 1945! Walter Larsen A

## 2013-04-02 NOTE — Progress Notes (Signed)
77 y.o. right-handed male with history of hypertension, CAD with PTCA. Admit 03/01/2013 with altered mental status and fever. Recent admission to [facility] with hemorrhagic stroke and discharged 02/27/2013 after a three-day hospital stay. Presented with increased headache and nausea as well as hallucinations and altered mental status. Cranial CT scan showed right temporal occipital parenchymal hematoma measuring up to 7 cm in light of trace intraventricular extension without hydrocephalus. Patient was placed on vancomycin /Zosyn for fever of 100.3 white blood cell count 15,000. An MRI was completed 03/05/2013 during workup of fever rule out abscess that showed a large subacute hematoma right temporoparietal lobe as well small areas of acute infarct in the right occipital parietal lobe and left temporal parietal lobe. Neurosurgery Dr. Danielle Dess consulted advise conservative care in reference of right parietal ICH. TEE completed showing no thrombus without PFO no vegetation. Infectious disease consult and for fever of unknown origin with workup ongoing including CT of the chest 03/08/2013 showed bilateral lobar and segmental pulmonary emboli and bilateral lower extremity Doppler studies consistent with deep vein thrombosis involving the right common femoral vein and right peroneal vein as well as deep vein thrombosis left peroneal vein. Underwent removable Greenfield filter 03/08/2012 per interventional radiology. Depakote was added for headache as well as seizure prevention. Subjective/Complaints: No complaints. Confused at times. Objective: Vital Signs: Blood pressure 100/73, pulse 72, temperature 97.7 F (36.5 C), temperature source Oral, resp. rate 18, height 5\' 8"  (1.727 m), weight 180 lb (81.647 kg), SpO2 93.00%. No acute distress. Chest clear to auscultation. Cardiac exam S1-S2 are regular. Abdominal exam active bowel sounds, soft. Extremities no edema.  Assessment/Plan: 1. Functional deficits  secondary to R temporal/occipital IPH as well as acute infarcts in R occipital/parietal and L temporal/ prieateal   Medical Problem List and Plan:  1. Right temporal parietal intracranial hemorrhage as well as right occipital infarct mental status slowly improving but has waxing /waning as expected 2. DVT Prophylaxis/Anticoagulation: Pulmonary emboli/DVT right common femoral vein and peroneal vein, left peroneal vein. 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 Pain Management/headaches. Depakote 500 mg daily, may be able to taper further prior to D/C, analgesic reme and heat for neck pain 4. Neuropsych: This patient is capable of making decisions on his own behalf.  5. ID. All antibiotics discontinued 03/08/2013 with workup unremarkable except for signs of DVT /PE , no source identified, WBCs decreasing , repeat UA C and S neg,  Discussed with ID consultant who rec cont monitoring off acetaminophen, afeb off tylenol 6. Hypertension. Normotensive off Norvasc 5 mg daily,still taking  Lopressor 50 mg twice a day. Monitor with increased mobility  7. Hyperlipidemia. Lipitor  8. BPH. Flomax.  ---foley removal 12/8, voiding trial still requiring caths, occ void 9. CAD/PTCA. Aspirin prior to admission discontinued secondary to hemorrhage. No chest pain or shortness of breath  10. Urinary retention. Voiding trial going well, nop caths overnite -UCx negative 11. Insomnia/night time restlessness--reduce seroquel tonight 25 more alert this am 12.  Edema BLE no pain or redness,has bilateral DVTs and IVC filter  albumin is mildly reduced, ECHO 03/07/2013, "normal EF, no sig valvular disease, hold norvasc, Lasix not helping will d/c, use TEDs 13.  Sacral decub, sidelying , Foam dressing, sleeping better on air mattress  LOS (Days) 18 A FACE TO FACE EVALUATION WAS PERFORMED  SWORDS,BRUCE HENRY 04/02/2013, 9:31 AM

## 2013-04-03 ENCOUNTER — Inpatient Hospital Stay (HOSPITAL_COMMUNITY): Payer: Medicare Other | Admitting: Physical Therapy

## 2013-04-03 NOTE — Progress Notes (Signed)
77 y.o. right-handed male with history of hypertension, CAD with PTCA. Admit 03/01/2013 with altered mental status and fever. Recent admission to [facility] with hemorrhagic stroke and discharged 02/27/2013 after a three-day hospital stay. Presented with increased headache and nausea as well as hallucinations and altered mental status. Cranial CT scan showed right temporal occipital parenchymal hematoma measuring up to 7 cm in light of trace intraventricular extension without hydrocephalus. Patient was placed on vancomycin /Zosyn for fever of 100.3 white blood cell count 15,000. An MRI was completed 03/05/2013 during workup of fever rule out abscess that showed a large subacute hematoma right temporoparietal lobe as well small areas of acute infarct in the right occipital parietal lobe and left temporal parietal lobe. Neurosurgery Dr. Danielle Dess consulted advise conservative care in reference of right parietal ICH. TEE completed showing no thrombus without PFO no vegetation. Infectious disease consult and for fever of unknown origin with workup ongoing including CT of the chest 03/08/2013 showed bilateral lobar and segmental pulmonary emboli and bilateral lower extremity Doppler studies consistent with deep vein thrombosis involving the right common femoral vein and right peroneal vein as well as deep vein thrombosis left peroneal vein. Underwent removable Greenfield filter 03/08/2012 per interventional radiology. Depakote was added for headache as well as seizure prevention. Subjective/Complaints: Feels well, no complaints Objective: Vital Signs: Blood pressure 118/68, pulse 63, temperature 98 F (36.7 C), temperature source Oral, resp. rate 17, height 5\' 8"  (1.727 m), weight 180 lb (81.647 kg), SpO2 95.00%. No acute distress. Chest clear to auscultation. Cardiac exam S1-S2 are regular. Abdominal exam active bowel sounds, soft. Extremities no edema.  Assessment/Plan: 1. Functional deficits secondary  to R temporal/occipital IPH as well as acute infarcts in R occipital/parietal and L temporal/ prieateal   Medical Problem List and Plan:  1. Right temporal parietal intracranial hemorrhage as well as right occipital infarct mental status slowly improving but has waxing /waning as expected 2. DVT Prophylaxis/Anticoagulation: Pulmonary emboli/DVT right common femoral vein and peroneal vein, left peroneal vein. 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 Pain Management/headaches. Reasonably well controlled 4. Neuropsych: This patient is capable of making decisions on his own behalf.  5. ID. All antibiotics discontinued 03/08/2013 with workup unremarkable except for signs of DVT /PE , no source identified, WBCs decreasing , repeat UA C and S neg,  Discussed with ID consultant who rec cont monitoring off acetaminophen, afeb off tylenol 6. Hypertension. Normotensive off Norvasc 5 mg daily,still taking  Lopressor 50 mg twice a day. Monitor with increased mobility  7. Hyperlipidemia. Lipitor  8. BPH. Flomax.  ---foley removal 12/8, voiding trial still requiring caths, occ void 9. CAD/PTCA. Aspirin prior to admission discontinued secondary to hemorrhage. No chest pain or shortness of breath  10. Urinary retention. Voiding trial going well, nop caths overnite -UCx negative 11. Insomnia/night time restlessness--reduce seroquel tonight 25 more alert this am 12.  Edema BLE no pain or redness,has bilateral DVTs and IVC filter  albumin is mildly reduced, ECHO 03/07/2013, "normal EF, no sig valvular disease, hold norvasc, Lasix not helping will d/c, use TEDs 13.  Sacral decub, sidelying , Foam dressing, sleeping better on air mattress  LOS (Days) 19 A FACE TO FACE EVALUATION WAS PERFORMED  Walter Larsen 04/03/2013, 11:31 AM

## 2013-04-03 NOTE — Progress Notes (Signed)
Complained of neck pain, chronic problem. PRN tylenol given at 1943. Also, complained of left eye hurting, slight redness with discharge. PRN eye gtts. Given with relief. Wife at bedside. Using urinal with assist. Up to bathroom and had BM. No further complaint of eye and or neck pain.Walter Larsen A

## 2013-04-03 NOTE — Progress Notes (Signed)
Physical Therapy Note  Patient Details  Name: Walter Larsen MRN: [id number] Date of Birth: 08/22/1935 Today's Date: 04/03/2013  1430-1525 (55 minutes) individual Pain : pt reports 3/10 pain buttocks/ premedicated Focus of treatment: transfer training; gait training; wc mobility training/activity tolerance Treatment: Pt up in wc upon arrival; BP 144/67 in sitting; gait room to apartment min assist without AD with 2 standing rest breaks ( increased respiratory rate with oxygen sats > 91 % RA); pt requested to use bathroom with assist for hygiene ; gait 25 feet X 1 without AD (min assist  With pt reporting increased anxiety concerning balance); gait X 2 with RW  25 feet min to close SBA with vcs to stay closer to AD and for hand placement sit >< stand; wc mobility -120 feet level surfaces SBA with vcs for direction finding; returned to bed with vcs for wc brakes and wc placement and assist with footrests; bed alarm activated.    [name],JIM 04/03/2013, 3:24 PM

## 2013-04-03 NOTE — Progress Notes (Signed)
Voiding fine 800cc this am no scan or i&o needed

## 2013-04-04 ENCOUNTER — Inpatient Hospital Stay (HOSPITAL_COMMUNITY): Payer: Medicare Other | Admitting: Physical Therapy

## 2013-04-04 ENCOUNTER — Encounter (HOSPITAL_COMMUNITY): Payer: Medicare Other | Admitting: Occupational Therapy

## 2013-04-04 ENCOUNTER — Inpatient Hospital Stay (HOSPITAL_COMMUNITY): Payer: Medicare Other

## 2013-04-04 DIAGNOSIS — R209 Unspecified disturbances of skin sensation: Secondary | ICD-10-CM

## 2013-04-04 DIAGNOSIS — I69998 Other sequelae following unspecified cerebrovascular disease: Secondary | ICD-10-CM

## 2013-04-04 DIAGNOSIS — I619 Nontraumatic intracerebral hemorrhage, unspecified: Secondary | ICD-10-CM

## 2013-04-04 DIAGNOSIS — I2510 Atherosclerotic heart disease of native coronary artery without angina pectoris: Secondary | ICD-10-CM

## 2013-04-04 DIAGNOSIS — I10 Essential (primary) hypertension: Secondary | ICD-10-CM

## 2013-04-04 NOTE — Progress Notes (Signed)
Social Work Patient ID: Walter Larsen, male   DOB: [birth year], 77 y.o.   MRN: [id number] Met with son to discuss Dad's care and need for education.  He is concerned his Mom can not provide the care pt needs. Will have help through the holidays But once granddaugther goes back to school will be wife and pt.  Encouraged him to discuss issues with Mom and this worker also will.  Son wants to take home For the holidays but can not assist much with his Dad's care.  Discussed short term NHP he will discuss with daughter and Mom.  Will start FL2 and discuss with wife Since she seems the be the main one to make that decision.

## 2013-04-04 NOTE — Progress Notes (Signed)
NUTRITION FOLLOW-UP  DOCUMENTATION CODES Per approved criteria  -Not Applicable   INTERVENTION: Continue Ensure Complete to PO BID. RD to continue to follow nutrition care plan.  Nutrition Diagnosis: Inadequate oral intake related to poor appetite as evidenced by limited PO intake. Resolved.  Goal: Intake to meet >90% of estimated nutrition needs. Improving.  Monitor:  weight trends, lab trends, I/O's, PO intake, supplement tolerance  ASSESSMENT: PMHx significant for HTN and CAD. Admitted 11/18 with AMS and fever, recent CVA. Cranial CT scan showed right temporal occipital parenchymal hematoma. Admitted to inpatient rehab for deconditioning from hospitalization on 12/2. Pt was followed by RD staff during acute hospitalization.  Planning to d/c on 12/24.  Continues on a Dysphagia 3 diet with Heart Healthy restrictions. Continues with orders for Ensure Complete PO BID. Meal intake now improved, pt is now eating 100%.   Sodium is at 132 and trending up.  Height: Ht Readings from Last 1 Encounters:  03/15/13 5\' 8"  (1.727 m)    Weight: Wt Readings from Last 1 Encounters:  03/30/13 180 lb (81.647 kg)  Admit wt 187 lb - stable  BMI:  Body mass index is 27.38 kg/(m^2). Overweight  Estimated Nutritional Needs: Kcal: 1700 - 2000 Protein: 80 - 95 g Fluid: 1.7 - 2 liters  Skin: stage II sacrum - per team "healing nicely"; 2+ pitting edema to BLE's  Diet Order: Dysphagia 3  EDUCATION NEEDS: -No education needs identified at this time   Intake/Output Summary (Last 24 hours) at 04/04/13 0957 Last data filed at 04/04/13 0800  Gross per 24 hour  Intake   1200 ml  Output   2350 ml  Net  -1150 ml    Last BM: 12/21  Labs:  No results found for this basename: NA, K, CL, CO2, BUN, CREATININE, CALCIUM, MG, PHOS, GLUCOSE,  in the last 168 hours  CBG (last 3)  No results found for this basename: GLUCAP,  in the last 72 hours  Scheduled Meds: . atorvastatin  20 mg Oral  QHS  . divalproex  500 mg Oral Daily  . feeding supplement (ENSURE COMPLETE)  237 mL Oral BID BM  . furosemide  20 mg Oral Daily  . lidocaine   Urethral Once  . methylphenidate  5 mg Oral BID WC  . metoprolol tartrate  50 mg Oral BID  . pantoprazole  40 mg Oral Daily  . QUEtiapine  25 mg Oral QHS  . tamsulosin  0.4 mg Oral QPC breakfast    Continuous Infusions: none     Jarold Motto MS, RD, LDN Pager: [phone] After-hours pager: [phone]

## 2013-04-04 NOTE — Progress Notes (Signed)
77 y.o. right-handed male with history of hypertension, CAD with PTCA. Admit 03/01/2013 with altered mental status and fever. Recent admission to [facility] with hemorrhagic stroke and discharged 02/27/2013 after a three-day hospital stay. Presented with increased headache and nausea as well as hallucinations and altered mental status. Cranial CT scan showed right temporal occipital parenchymal hematoma measuring up to 7 cm in light of trace intraventricular extension without hydrocephalus. Patient was placed on vancomycin /Zosyn for fever of 100.3 white blood cell count 15,000. An MRI was completed 03/05/2013 during workup of fever rule out abscess that showed a large subacute hematoma right temporoparietal lobe as well small areas of acute infarct in the right occipital parietal lobe and left temporal parietal lobe. Neurosurgery Dr. Danielle Dess consulted advise conservative care in reference of right parietal ICH. TEE completed showing no thrombus without PFO no vegetation. Infectious disease consult and for fever of unknown origin with workup ongoing including CT of the chest 03/08/2013 showed bilateral lobar and segmental pulmonary emboli and bilateral lower extremity Doppler studies consistent with deep vein thrombosis involving the right common femoral vein and right peroneal vein as well as deep vein thrombosis left peroneal vein. Underwent removable Greenfield filter 03/08/2012 per interventional radiology. Depakote was added for headache as well as seizure prevention.  Subjective/Complaints: Neck and sacrum still sore. Having better nights. Safety awareness still suspect Denies headache Review of Systems - poor awareness of deficits  Objective: Vital Signs: Blood pressure 126/72, pulse 67, temperature 98.9 F (37.2 C), temperature source Oral, resp. rate 19, height 5\' 8"  (1.727 m), weight 81.647 kg (180 lb), SpO2 95.00%. No results found. No results found for this or any previous visit  (from the past 72 hour(s)).   HEENT: normal and oral mucosa dry Cardio: RRR and no murmur Resp: CTA B/L and unlabored GI: BS positive and non tender Extremity:  Pulses positive and 2+ pretibial Edema Skin: decub sacrum with slit, no drainage, clean, healthy tissue Neuro: Awakens to voice, Cranial Nerve II-XII normal, Abnormal Sensory  Poor attention to task, Normal Motor, Abnormal FMC Ataxic/ dec FMC and Other Musc/Skel:  Normal General no acute distress Mood and Affect bright. Improving awareness and insight.   Assessment/Plan: 1. Functional deficits secondary to R temporal/occipital IPH as well as acute infarcts in R occipital/parietal and L temporal/ prieateal which require 3+ hours per day of interdisciplinary therapy in a comprehensive inpatient rehab setting. Physiatrist is providing close team supervision and 24 hour management of active medical problems listed below. Physiatrist and rehab team continue to assess barriers to discharge/monitor patient progress toward functional and medical goals.   FIM: FIM - Bathing Bathing Steps Patient Completed: Chest;Right Arm;Left Arm;Abdomen;Front perineal area;Left upper leg;Right lower leg (including foot);Left lower leg (including foot);Buttocks;Right upper leg Bathing: 4: Steadying assist  FIM - Upper Body Dressing/Undressing Upper body dressing/undressing steps patient completed: Put head through opening of pull over shirt/dress;Pull shirt over trunk;Thread/unthread right sleeve of pullover shirt/dresss;Thread/unthread left sleeve of pullover shirt/dress Upper body dressing/undressing: 5: Set-up assist to: Obtain clothing/put away FIM - Lower Body Dressing/Undressing Lower body dressing/undressing steps patient completed: Pull pants up/down Lower body dressing/undressing: 4: Min-Patient completed 75 plus % of tasks  FIM - Toileting Toileting steps completed by patient: Adjust clothing prior to toileting;Adjust clothing after  toileting Toileting Assistive Devices: Grab bar or rail for support Toileting: 3: Mod-Patient completed 2 of 3 steps  FIM - Diplomatic Services operational officer Devices: Grab bars Toilet Transfers: 4-From toilet/BSC: Min A (steadying Pt. >  75%);4-To toilet/BSC: Min A (steadying Pt. > 75%)  FIM - Bed/Chair Transfer Bed/Chair Transfer Assistive Devices: HOB elevated Bed/Chair Transfer: 5: Supine > Sit: Supervision (verbal cues/safety issues);4: Bed > Chair or W/C: Min A (steadying Pt. > 75%)  FIM - Locomotion: Wheelchair Distance: 150 Locomotion: Wheelchair: 5: Travels 150 ft or more: maneuvers on rugs and over door sills with supervision, cueing or coaxing FIM - Locomotion: Ambulation Locomotion: Ambulation Assistive Devices: Other (comment) (HHA) Ambulation/Gait Assistance: 4: Min assist Locomotion: Ambulation: 1: Travels less than 50 ft with minimal assistance (Pt.>75%)  Comprehension Comprehension Mode: Auditory Comprehension: 2-Understands basic 25 - 49% of the time/requires cueing 51 - 75% of the time  Expression Expression Mode: Verbal Expression: 3-Expresses basic 50 - 74% of the time/requires cueing 25 - 50% of the time. Needs to repeat parts of sentences.  Social Interaction Social Interaction: 2-Interacts appropriately 25 - 49% of time - Needs frequent redirection.  Problem Solving Problem Solving: 2-Solves basic 25 - 49% of the time - needs direction more than half the time to initiate, plan or complete simple activities  Memory Memory: 1-Recognizes or recalls less than 25% of the time/requires cueing greater than 75% of the time  Medical Problem List and Plan:  1. Right temporal parietal intracranial hemorrhage as well as right occipital infarct mental status slowly improving but has waxing /waning as expected 2. DVT Prophylaxis/Anticoagulation: Pulmonary emboli/DVT right common femoral vein and peroneal vein, left peroneal vein. 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 Pain Management/headaches. Depakote 500 mg daily, may be able to taper further prior to D/C, analgesic creme and heat for neck pain--reminded about posture 4. Neuropsych: This patient is capable of making decisions on his own behalf.  5. ID. All antibiotics discontinued 03/08/2013 with workup unremarkable except for signs of DVT /PE , no source identified, WBCs decreasing , repeat UA C and S neg,  6. Hypertension. Normotensive off Norvasc 5 mg daily,still taking  Lopressor 50 mg twice a day. Monitor with increased mobility  7. Hyperlipidemia. Lipitor  8. BPH. Flomax.  ---foley removal 12/8, voiding trial still requiring caths, occ void 9. CAD/PTCA. Aspirin prior to admission discontinued secondary to hemorrhage. No chest pain or shortness of breath  10. Urinary retention. Voiding trial going well, nop caths overnite -UCx negative 11. Insomnia/night time restlessness--reduce seroquel tonight 25 more alert this am 12.  Edema BLE no pain or redness,has bilateral DVTs and IVC filter  albumin is mildly reduced, ECHO 03/07/2013, "normal EF, no sig valvular disease, hold norvasc, Lasix not helping will d/c, use TEDs 13.  Sacral decub, sidelying , Foam dressing, sleeping better on air mattress. Wound healing nicely  LOS (Days) 20 A FACE TO FACE EVALUATION WAS PERFORMED  Mykayla Brinton T 04/04/2013, 8:34 AM

## 2013-04-04 NOTE — Plan of Care (Signed)
Problem: RH BLADDER ELIMINATION Goal: RH STG MANAGE BLADDER WITH ASSISTANCE STG Manage Bladder With min Assistance with voiding, if home with indwelling catheter min assist for maintaince by patient and/or caregiver  Outcome: Not Progressing Patient still requires in and out cath, total assist of staff, no family present to teach, pt on Flomax, pt voids on occasion, question overflow. Patient in and out cath for 700 cc today at 1100 am

## 2013-04-04 NOTE — Progress Notes (Signed)
Physical Therapy Session Note  Patient Details  Name: Walter Larsen MRN: [id number] Date of Birth: 01/18/1936  Today's Date: 04/04/2013 Time: 0905-1050 and [id number] Time Calculation (min): 45 min and 27 min  Short Term Goals: Week 3:  PT Short Term Goal 1 (Week 3): STG's = LTG's secondary to ELOS  Skilled Therapeutic Interventions/Progress Updates:    Treatment Session 1: Pt received seated in w/c with son present. Pt agreeable to session. Oriented x3 to self, place, and situation; required max cueing to utilize visual aid on wall to orient self to date. Session focused on initiating family training, as this therapist was under the impression that pt would be discharging home with 24/7 assist of son and granddaughter. Pt performed w/c mobility x155' (to/from pt room) with bilat UE's with supervision, cueing to negotiate doorways. Therapist verbally explained and demonstrated appropriate assist and cueing required for stand-pivot from w/c<>bed with min A and min verbal/tactile cueing for hand placement. Son then provided min A for w/c<>bed transfer with appropriate cueing and assistance. Performed car transfer (squat pivot from w/c>car; stand-pivot from car>w/c) with min A and verbal/tactile cueing for hand placement. Pt's son then performed transfer requiring min cueing for appropriate positioning/technique. After seated rest break, pt negotiated 6 stairs with bilat rails, step-to to ascend/descend and min A. Therapist provided min A and verbal cueing for sequencing to ascend/descend initial 3 steps, pt's son provided min A to ascend and descend remaining 3 steps; verbal cueing provided to son/pt for sequencing during final 3 steps. Pt able to find room with mod verbal cueing for direction; pt was then able to remember room number and locate room with increased time. Therapist departed with pt seated in w/c with quick release belt in place for safety, son present, and all needs within reach.    Following session, therapist attempted to confirm that patient's son or granddaughter would be present at all times after discharge to home. Pt's son stated he would be present only "some of the time" due to work obligations. Therapist reiterated recommendation that only a trained family member provide physical assistance with functional mobility, as pt's wife is not physically capable of safely assisting pt. Son in agreement, but expresses frustration that pt's wife is insistent on pt discharging home without assistance of other family members. Social worker present for conversation to discuss options/resources available to patient/family.   Treatment Session 2: Pt received semi-reclined in bed; agreeable to therapy. No family members present. Oriented x3 to self, place, and situation; required mod cueing for use of visual aid to orient self to date. Session focused on improving endurance and increasing pt independence with gait and w/c mobility in home environment. Supine>sit with no assist using bed rail, HOB flat. Bed>w/c with supervision/setup; no cueing required for hand placement. W/c mobility x185' in controlled and home environments with supervision, cueing for obstacle negotiation. Performed sit<>stand from w/c with steadying assist. Static standing without UE support x30 seconds with supervision. Gait x56' with no assistive device, min A for stability/balance. Pt managed w/c brakes without cueing, arm rests with min cueing and increased time, and leg rests with hand-over-hand assist. Activity tolerance markedly improved this session, as compared with previous sessions. Session ended in pt room, where patient was left seated in w/c with quick release belt in place for safety and all needs within reach.   Therapy Documentation Precautions:  Precautions Precautions: Fall Precaution Comments: low blood pressure, becomes dizzy Restrictions Weight Bearing Restrictions: No Pain: Pain  Assessment Pain  Assessment: FLACC Pain Score: 3  Pain Location: Neck Pain Orientation: Posterior Pain Descriptors / Indicators: Aching Pain Onset: On-going Patients Stated Pain Goal: 1 Pain Intervention(s): Rest;Other (Comment) (educated pt/family member on stretching) Multiple Pain Sites: No Locomotion : Wheelchair Mobility Distance: 155   Long term goals addressing the following were downgraded secondary to pt progressing more slowly than expected: dynamic standing balance, gait distance in controlled environment, and stair negotiation.  See FIM for current functional status  Therapy/Group: Individual Therapy  Burrell Hodapp, [name] 04/04/2013, 11:54 AM

## 2013-04-04 NOTE — Progress Notes (Signed)
No further complaint of left eye hurting. At 2040 reports "leaking urine". Patient voided, PVR=76. At 0245 complained of feeling full, bladder scan=614, I & O cath=650cc's. Has been resting quietly since. Continues with 2 + pitting edema to BLE's, left > right. Family stays at night. Walter Larsen A

## 2013-04-04 NOTE — Progress Notes (Signed)
Occupational Therapy Session Note  Patient Details  Name: Walter Larsen MRN: [id number] Date of Birth: 10/22/1935  Today's Date: 04/04/2013 Time: 0800-0905 Time Calculation (min): 65 min  Short Term Goals: Week 3:  OT Short Term Goal 1 (Week 3): Focus on LTGs  Skilled Therapeutic Interventions/Progress Updates:    Pt performed bathing and dressing during session.  Rolled pt down to the ADL apartment to use the tub shower with the bench as this is what pt will be using at home.  Pt removed clothing with overall min assist to stand to lower pants.  He needed mod questioning cueing to sequence and at one point forgot to take off his left sock.  Transfer to the tub bench also with min assist.  Pt needed mod instructional cueing to sequence through bathing tasks with 2 rest breaks needed as well.  Throughout bathing and dressing pt would constantly state "Oh my, oh, my" voicing how fatigued he was.  Oxygen sats unable to be obtained secondary to pt having damp fingers.  Pt performed dressing sit to stand from the wheelchair with overall min assist.  Pt able to orient all clothing correctly.  Transferred back to the room via wheelchair.  Attempted toilet transfer with min assist hand held to ambulate to the toilet.  Pt needing constant min assist to maintain some cervical extension as well as lumbar extension in standing.  Pt unable to void or have BM during toileting.  Son present when pt returned to the room.  Pt able to state place and reason for hospitalization this visit.     Therapy Documentation Precautions:  Precautions Precautions: Fall Precaution Comments: low blood pressure, becomes dizzy Restrictions Weight Bearing Restrictions: No  Pain: Pain Assessment Pain Assessment: No/denies pain Pain Score: 3  Pain Location: Neck Pain Orientation: Posterior Pain Descriptors / Indicators: Aching Pain Onset: On-going Patients Stated Pain Goal: 1 Pain Intervention(s): Rest;Other (Comment)  (educated pt/family member on stretching) Multiple Pain Sites: No ADL: See FIM for current functional status  Therapy/Group: Individual Therapy  Dena Esperanza OTR/L 04/04/2013, 12:06 PM

## 2013-04-04 NOTE — Progress Notes (Signed)
Speech Language Pathology Daily Session Note  Patient Details  Name: Walter Larsen MRN: [id number] Date of Birth: 1935-06-08  Today's Date: 04/04/2013 Time: 0800-0905 Time Calculation (min): 65 min  Short Term Goals: Week 3: SLP Short Term Goal 1 (Week 3): Pt will sustain attention to basic, structured task for 60 seconds with Max cues SLP Short Term Goal 2 (Week 3): Pt will follow one-step commands with 80% accuracy with Mod cues SLP Short Term Goal 3 (Week 3): Pt will answer basic yes/no questions with 80% accuracy with Max cues SLP Short Term Goal 4 (Week 3): Pt will demonstrate orientation x4 with Supervision SLP Short Term Goal 5 (Week 3): Pt will name common objects with 80% accuracy with Mod cues  Skilled Therapeutic Interventions: Treatment focused on cognitive goals. SLP facilitated session with Min cues to use external memory aids to demonstrate orientation x4. Pt sustained attention to structured table tasks for ~20 minutes with Mod cues for redirection. Pt required Max cues for 4-step sequencing of picture cards, however question possible impact of visual deficits, as pt was able to verbally sequence steps with Min question cues. Pt demonstrated basic verbal problem solving related to safety judgment with Mod question and verbal cues. He required Mod-Max cues to count money, although again SLP questions visual involvement. Pt recalled his room number with Mod I and recounted the overall events of speech therapy session to his son upon return to room with Min question cues. Continue plan of care.   FIM:  Comprehension Comprehension: 3-Understands basic 50 - 74% of the time/requires cueing 25 - 50%  of the time Expression Expression Mode: Verbal Expression: 3-Expresses basic 50 - 74% of the time/requires cueing 25 - 50% of the time. Needs to repeat parts of sentences. Social Interaction Social Interaction: 4-Interacts appropriately 75 - 89% of the time - Needs redirection for  appropriate language or to initiate interaction. Problem Solving Problem Solving: 3-Solves basic 50 - 74% of the time/requires cueing 25 - 49% of the time Memory Memory: 3-Recognizes or recalls 50 - 74% of the time/requires cueing 25 - 49% of the time  Pain Pain Assessment Pain Assessment: No/denies pain Pain Score: 3  Pain Location: Buttocks Pain Orientation: Posterior Pain Descriptors / Indicators: Aching Pain Onset: On-going Patients Stated Pain Goal: 1 Pain Intervention(s): Repositioned;RN made aware Multiple Pain Sites: No  Therapy/Group: Individual Therapy   Maxcine Ham, M.A. CCC-SLP [phone]   Maxcine Ham 04/04/2013, 12:23 PM

## 2013-04-04 NOTE — Progress Notes (Signed)
Patient returned from therapy complained of discomfort, unable to void, patient stated" no just take it out" bladder scan performed results 561 cc, in and out cath performed by NT for 700 cc , no family present for teaching.patient tolerated well. Roberts-VonCannon, Chavy [facility]

## 2013-04-05 ENCOUNTER — Inpatient Hospital Stay (HOSPITAL_COMMUNITY): Payer: Medicare Other | Admitting: Physical Therapy

## 2013-04-05 ENCOUNTER — Inpatient Hospital Stay (HOSPITAL_COMMUNITY): Payer: Medicare Other

## 2013-04-05 DIAGNOSIS — I69998 Other sequelae following unspecified cerebrovascular disease: Secondary | ICD-10-CM

## 2013-04-05 DIAGNOSIS — I10 Essential (primary) hypertension: Secondary | ICD-10-CM

## 2013-04-05 DIAGNOSIS — I2510 Atherosclerotic heart disease of native coronary artery without angina pectoris: Secondary | ICD-10-CM

## 2013-04-05 DIAGNOSIS — R209 Unspecified disturbances of skin sensation: Secondary | ICD-10-CM

## 2013-04-05 DIAGNOSIS — I619 Nontraumatic intracerebral hemorrhage, unspecified: Secondary | ICD-10-CM

## 2013-04-05 NOTE — Progress Notes (Signed)
Speech Language Pathology Discharge Summary & Final Treatment Note  Patient Details  Name: [name] MRN: [id number] Date of Birth: 09-02-1935  Today's Date: 04/05/2013 Time: 9562-1308 Time Calculation (min): 45 min  Skilled Therapeutic Intervention: Treatment focused on pt/family education with granddaughter present. SLP facilitated session with education regarding pt's current cognitive status, with handout provided. Pt demonstrated orientation x4 with Min cues for use of external aids. He followed commands with 100% accuracy and participated in structured cognitive task for ~10 minutes with Min redirective cues. All questions were answered.   Patient has met 6 of 6 long term goals.  Patient to discharge at overall Mod level.  Reasons goals not met: N/A   Clinical Impression/Discharge Summary: Pt has met 6 out of 6 LTGs during this admission, with functional gains with cognition made since pt's LOA and sustained attention have improved. Pt is discharging at an overall Mod assist level in regards to cognitive function. He requires Min cues to use external aids for orientation and recall of new information. He requires Mod cues for basic problem solving and sustained attention. Pt has demonstrated intellectual awareness with supervision level question cues. He has made significant gains with expressive language with reduced perseveration and his ability to follow commands, doing the above with Mod I. Education has been completed with patient and family members. He is scheduled to discharge tomorrow with 24/7 supervision and it is recommended that he continue to receive [facility] SLP services to maximize functional independence and reduce caregiver burden.  Care Partner:  Caregiver Able to Provide Assistance: Yes  Type of Caregiver Assistance: Cognitive  Recommendation:  Home Health SLP;24 hour supervision/assistance  Rationale for SLP Follow Up: Maximize cognitive function and independence;Reduce  caregiver burden   Equipment:  None recommended by SLP.  Reasons for discharge: Treatment goals met;Discharged from hospital   Patient/Family Agrees with Progress Made and Goals Achieved: Yes   See FIM for current functional status   Maxcine Ham, M.A. CCC-SLP [phone]   Maxcine Ham 04/05/2013, 12:30 PM

## 2013-04-05 NOTE — Progress Notes (Signed)
Occupational Therapy Discharge Summary  Patient Details  Name: Walter Larsen MRN: [id number] Date of Birth: 1936-03-27  Today's Date: 04/05/2013 Time: 0730-0830 and [id number] Time Calculation (min): 60 min and 30 min   Patient has met 9 of 9 long term goals due to improved activity tolerance, improved balance, postural control, ability to compensate for deficits, improved attention, improved awareness and improved coordination.  Patient to discharge at overall Supervision level.  Patient's care partner is independent to provide the necessary physical and cognitive assistance at discharge.  Patient's granddaughter is moving in with him and the patient's wife to assist as needed as patient's wife is not able to physically assist if needed. Granddaughter has gone through family training and demonstrates the ability to provide the cognitive assist and occasional physical assist that's needed.   Reasons goals not met: N/A. All LTGs met.   Recommendation:  Patient will benefit from ongoing skilled OT services in home health setting to continue to advance functional skills in the area of BADL and iADL.  Equipment: No equipment provided  Reasons for discharge: treatment goals met and discharge from hospital  Patient/family agrees with progress made and goals achieved: Yes  Skilled Therapeutic Intervention Session 1: Pt seen for ADL retraining with focus on family education, cognitive skills, attention to task, dynamic standing balance, and functional transfers. Discussed discharge plans with granddaughter who reports she will be moving in with the pt and his wife. She also reported they will be hiring nursing assistance at home. The granddaughter plans to return to school on Jan. 12 however they will have assistance from the pt's son and nursing while she is gone for 2-3 hours during the day. Therapist emphasized not allowing pt to be alone with wife and she reported understanding this for the safety  of everyone. Granddaughter completed all self-care tasks with pt and was checked off to assist with transfers in the room. She provided appropriate cues and close supervision as she ambulated with pt to toilet with no AD. Pt with no SOB or LOB as he managed clothing up/down during toilet task. Completed bathing and dressing in ADL apartment. Granddaughter provided visual demonstration of tub transfer using TTB then pt returned demonstration at supervision level. Pt completed bathing at supervision with min cues provided from granddaughter on washing all body parts. Pt required increased time and min cues for orientation of clothing during dressing. Pt required min assist for LB dressing to fasten one shoe using shoe buttons secondary to cognitive deficits. Pt more alert on this date and oriented x4 with increased time for naming month and year. Pt left in w/c with QRB donned all needs in reach. Granddaughter present to finish supervising pt with breakfast. No questions or concerns about discharge at this time and granddaughter plans on staying for therapy today. Therapist notified SW of discharge plans.   Session 2: Pt received supine in bed with wife present. Pt requesting to complete toilet task. Completed supine>sit with supervision and ambulated to toilet without AD and close supervision. While pt completing toilet task at supervision level, therapist discussed discharge plans with wife and she reports no questions or concerns at this time. Pt ambulated toilet>sink with close supervision and complete hand hygiene while in standing. Pt very fatigued and required rest break before going to therapy gym. In therapy gym, pt engaged in dynamic standing balance game of horseshoes. Pt with min guard to supervision for standing balance as he tossed horseshoes then required min guard when reaching to  floor to pick them up. Pt required rest breaks between each game secondary to fatigue. Returned pt to room and left in  bed with wife present and all needs in reach. No questions or concerns about discharge at this time.   OT Discharge Precautions/Restrictions  Precautions Precautions: Fall Restrictions Weight Bearing Restrictions: No General   Vital Signs Therapy Vitals Temp: 98.6 F (37 C) Temp src: Oral Pulse Rate: 65 Resp: 18 BP: 113/64 mmHg Patient Position, if appropriate: Lying Oxygen Therapy SpO2: 94 % O2 Device: None (Room air) Pain  No c/o pain during therapy session. ADL   Vision/Perception  Vision - History Baseline Vision: Wears glasses all the time Patient Visual Report: Eye fatigue/eye pain/headache;Peripheral vision impairment Vision - Assessment Vision Assessment: Vision tested Tracking/Visual Pursuits: Requires cues, head turns, or add eye shifts to track;Decreased smoothness of horizontal tracking Additional Comments: Decreased attention left visual field Praxis Praxis: Intact  Cognition Overall Cognitive Status: Impaired/Different from baseline Arousal/Alertness: Awake/alert Orientation Level: Oriented to person;Oriented to place;Oriented to situation;Disoriented to time;Other (comment) (disoriented to time approx 50% of time) Attention: Sustained Sustained Attention: Appears intact Memory: Impaired Memory Impairment: Decreased recall of new information Safety/Judgment: Impaired Sensation Sensation Light Touch: Appears Intact Hot/Cold: Appears Intact Coordination Gross Motor Movements are Fluid and Coordinated: No Fine Motor Movements are Fluid and Coordinated: Yes Motor    Mobility     Trunk/Postural Assessment     Balance   Extremity/Trunk Assessment RUE Assessment RUE Assessment: Within Functional Limits LUE Assessment LUE Assessment: Within Functional Limits  See FIM for current functional status  Daneil Dan 04/05/2013, 8:51 AM

## 2013-04-05 NOTE — Progress Notes (Signed)
77 y.o. right-handed male with history of hypertension, CAD with PTCA. Admit 03/01/2013 with altered mental status and fever. Recent admission to [facility] with hemorrhagic stroke and discharged 02/27/2013 after a three-day hospital stay. Presented with increased headache and nausea as well as hallucinations and altered mental status. Cranial CT scan showed right temporal occipital parenchymal hematoma measuring up to 7 cm in light of trace intraventricular extension without hydrocephalus. Patient was placed on vancomycin /Zosyn for fever of 100.3 white blood cell count 15,000. An MRI was completed 03/05/2013 during workup of fever rule out abscess that showed a large subacute hematoma right temporoparietal lobe as well small areas of acute infarct in the right occipital parietal lobe and left temporal parietal lobe. Neurosurgery Dr. Danielle Dess consulted advise conservative care in reference of right parietal ICH. TEE completed showing no thrombus without PFO no vegetation. Infectious disease consult and for fever of unknown origin with workup ongoing including CT of the chest 03/08/2013 showed bilateral lobar and segmental pulmonary emboli and bilateral lower extremity Doppler studies consistent with deep vein thrombosis involving the right common femoral vein and right peroneal vein as well as deep vein thrombosis left peroneal vein. Underwent removable Greenfield filter 03/08/2012 per interventional radiology. Depakote was added for headache as well as seizure prevention.  Subjective/Complaints: Pain under reasonable control. Nights seem to be better. Up with therapy already this  Am.   Review of Systems - poor awareness of deficits but improving  Objective: Vital Signs: Blood pressure 113/64, pulse 65, temperature 98.6 F (37 C), temperature source Oral, resp. rate 18, height 5\' 8"  (1.727 m), weight 81.647 kg (180 lb), SpO2 94.00%. No results found. No results found for this or any previous  visit (from the past 72 hour(s)).   HEENT: normal and oral mucosa dry Cardio: RRR and no murmur Resp: CTA B/L and unlabored GI: BS positive and non tender Extremity:  Pulses positive and 2+ pretibial Edema Skin: decub sacrum with slit, no drainage, clean, healthy tissue Neuro: Awakens to voice, Cranial Nerve II-XII normal, Abnormal Sensory  Poor attention to task, Normal Motor, Abnormal FMC Ataxic/ dec FMC and Other Musc/Skel:  Normal General no acute distress Mood and Affect bright. Improving awareness and insight.   Assessment/Plan: 1. Functional deficits secondary to R temporal/occipital IPH as well as acute infarcts in R occipital/parietal and L temporal/ prieateal which require 3+ hours per day of interdisciplinary therapy in a comprehensive inpatient rehab setting. Physiatrist is providing close team supervision and 24 hour management of active medical problems listed below. Physiatrist and rehab team continue to assess barriers to discharge/monitor patient progress toward functional and medical goals.   FIM: FIM - Bathing Bathing Steps Patient Completed: Chest;Right Arm;Left Arm;Abdomen;Front perineal area;Left upper leg;Right lower leg (including foot);Left lower leg (including foot);Buttocks;Right upper leg Bathing: 5: Supervision: Safety issues/verbal cues  FIM - Upper Body Dressing/Undressing Upper body dressing/undressing steps patient completed: Put head through opening of pull over shirt/dress;Pull shirt over trunk;Thread/unthread right sleeve of pullover shirt/dresss;Thread/unthread left sleeve of pullover shirt/dress Upper body dressing/undressing: 5: Set-up assist to: Obtain clothing/put away FIM - Lower Body Dressing/Undressing Lower body dressing/undressing steps patient completed: Pull pants up/down;Don/Doff left sock;Thread/unthread right pants leg;Thread/unthread left pants leg;Thread/unthread right underwear leg;Thread/unthread left underwear leg;Pull underwear  up/down;Don/Doff right sock;Don/Doff left shoe;Fasten/unfasten right shoe;Don/Doff right shoe Lower body dressing/undressing: 4: Min-Patient completed 75 plus % of tasks  FIM - Toileting Toileting steps completed by patient: Adjust clothing prior to toileting;Adjust clothing after toileting;Performs perineal hygiene Toileting Assistive  Devices: Grab bar or rail for support Toileting: 5: Supervision: Safety issues/verbal cues  FIM - Diplomatic Services operational officer Devices: Grab bars Toilet Transfers: 5-To toilet/BSC: Supervision (verbal cues/safety issues);5-From toilet/BSC: Supervision (verbal cues/safety issues)  FIM - Press photographer Assistive Devices: Arm rests;Bed rails Bed/Chair Transfer: 6: Supine > Sit: No assist;5: Bed > Chair or W/C: Supervision (verbal cues/safety issues)  FIM - Locomotion: Wheelchair Distance: 185 Locomotion: Wheelchair: 5: Travels 150 ft or more: maneuvers on rugs and over door sills with supervision, cueing or coaxing FIM - Locomotion: Ambulation Locomotion: Ambulation Assistive Devices: Other (comment) (HHA) Ambulation/Gait Assistance: 4: Min assist Locomotion: Ambulation: 2: Travels 50 - 149 ft with minimal assistance (Pt.>75%)  Comprehension Comprehension Mode: Auditory Comprehension: 2-Understands basic 25 - 49% of the time/requires cueing 51 - 75% of the time  Expression Expression Mode: Verbal Expression: 3-Expresses basic 50 - 74% of the time/requires cueing 25 - 50% of the time. Needs to repeat parts of sentences.  Social Interaction Social Interaction: 2-Interacts appropriately 25 - 49% of time - Needs frequent redirection.  Problem Solving Problem Solving: 2-Solves basic 25 - 49% of the time - needs direction more than half the time to initiate, plan or complete simple activities  Memory Memory: 1-Recognizes or recalls less than 25% of the time/requires cueing greater than 75% of the time  Medical  Problem List and Plan:  1. Right temporal parietal intracranial hemorrhage as well as right occipital infarct mental status slowly improving but has waxing /waning as expected 2. DVT Prophylaxis/Anticoagulation: Pulmonary emboli/DVT right common femoral vein and peroneal vein, left peroneal vein. Status post Greenfield filter 03/08/2013   3. Pain Management/headaches. Depakote 500 mg daily, may be able to taper further prior to D/C, analgesic creme and heat for neck pain  4. Neuropsych: This patient is capable of making decisions on his own behalf.  5. ID. All antibiotics discontinued 03/08/2013 with workup unremarkable except for signs of DVT /PE , no source identified, WBCs decreasing , repeat UA C and S neg,  6. Hypertension. Normotensive off Norvasc 5 mg daily,still taking  Lopressor 50 mg twice a day. Monitor with increased mobility  7. Hyperlipidemia. Lipitor  8. BPH. Flomax.  ---foley removal 12/8, voiding trial still requiring caths, occ void 9. CAD/PTCA. Aspirin prior to admission discontinued secondary to hemorrhage. No chest pain or shortness of breath  10. Urinary retention. Voiding trial going well, nop caths overnite -UCx negative 11. Insomnia/night time restlessness--reduce seroquel tonight 25 more alert this am 12.  Edema BLE no pain or redness,has bilateral DVTs and IVC filter  albumin is mildly reduced, ECHO 03/07/2013, "normal EF, no sig valvular disease, hold norvasc, Lasix not helping will d/c, use TEDs 13.  Sacral decub, sidelying , Foam dressing, sleeping better on air mattress. Wound healing nicely  LOS (Days) 21 A FACE TO FACE EVALUATION WAS PERFORMED  Conchita Truxillo T 04/05/2013, 9:24 AM

## 2013-04-05 NOTE — Progress Notes (Signed)
Physical Therapy Discharge Summary  Patient Details  Name: Walter Larsen MRN: [id number] Date of Birth: 07-10-1935  Today's Date: 04/05/2013 Time: 0900-1000 Time Calculation (min): 60 min  Patient has met 13 of 13 long term goals due to improved activity tolerance, improved balance, improved postural control, increased strength, decreased pain, ability to compensate for deficits, functional use of  right upper extremity and right lower extremity, improved attention, improved awareness and improved coordination.  Patient to discharge at a wheelchair level with Supervision to [facility] Assist. Patient's care partner is independent to provide the necessary physical assistance at discharge.  Reasons goals not met: N/A; all goals met or surpassed.  Recommendation:  Patient will benefit from ongoing skilled PT services in home health setting to continue to advance safe functional mobility, address ongoing impairments in functional balance, gait stability, endurance, attention/awareness, stability/independence with functional mobility, and minimize fall risk.  Equipment: wheelchair and specialized wheelchair cushion [id number])  Reasons for discharge: treatment goals met and discharge from hospital  Patient/family agrees with progress made and goals achieved: Yes  Skilled Therapeutic Interventions/Progress Updates: Pt received seated in w/c with granddaughter present. Pt oriented x3 to self, place, and situation; requires mod verbal cueing to utilize visual aid to recall date. Pt agreeable to session. Granddaughter states that she will be moving into patient's home at time of discharge to provide assistance with functional mobility. Therefore, session focused on providing hands-on training to patient's granddaughter to ensure safe discharge home. Supine<>sit with HOB flat with mod I (using 1 bed rail). Pt performed w/c mobility x150' in controlled and home environments with bilat UE's requiring supervision; pt  effective negotiated narrow door frame without assistance or cueing. See discharge evaluation below for further detail.  The following were performed initially by therapist (with concurrent explanation of appropriate technique and assistance), followed by effective return demonstration by granddaughter: stand-pivot from bed<>chair with min A; furniture transfer (stand pivot) with supervision; negotiation of 6 steps with bilat hand rails, step-to pattern, and min guard for stability/balance. After demonstration of car transfer (stand-pivot) by this therapist, pt performed x2 trials with effective return demonstration of technique during final trial.  Therapist demonstrated and explained hand placement utilized with gait. Pt performed gait x96' in controlled and home environments with min guard of granddaughter, who gave effective return demonstration of technique and hand placement.  Therapist demonstrated how to break down w/c, manage leg/arm rests. Granddaughter gave effective return demonstration. Session ended in pt room, where pt was left seated in w/c with granddaughter present, quick release belt in place for safety, and all needs within reach.  PT Discharge Precautions/Restrictions Precautions Precautions: Fall Restrictions Weight Bearing Restrictions: No Pain Pain Assessment Pain Assessment: FLACC Pain Score: 2  Faces Pain Scale: Hurts a little bit Pain Type: Chronic pain Pain Location: Neck Pain Orientation: Posterior Pain Descriptors / Indicators: Aching Patients Stated Pain Goal: 1 Pain Intervention(s): Repositioned;Other (Comment) (stretching) Multiple Pain Sites: No Vision/Perception  Vision - History Baseline Vision: Wears glasses all the time Patient Visual Report: Eye fatigue/eye pain/headache;Peripheral vision impairment Vision - Assessment Vision Assessment: Vision tested Tracking/Visual Pursuits: Requires cues, head turns, or add eye shifts to track;Decreased  smoothness of horizontal tracking Additional Comments: Decreased attention left visual field Praxis Praxis: Intact  Cognition Overall Cognitive Status: Impaired/Different from baseline Arousal/Alertness: Awake/alert Orientation Level: Oriented to person;Oriented to place;Oriented to situation;Oriented to time;Other (comment) (with use of external aids) Attention: Sustained Sustained Attention: Impaired Sustained Attention Impairment: Verbal complex;Functional complex Memory: Impaired Memory Impairment: Decreased  recall of new information Awareness: Impaired Awareness Impairment: Emergent impairment Problem Solving: Impaired Problem Solving Impairment: Verbal basic;Functional basic Executive Function: Sequencing Sequencing: Impaired Sequencing Impairment: Verbal basic Safety/Judgment: Impaired Scientist, research (physical sciences): Appears Intact Hot/Cold: Appears Intact Coordination Gross Motor Movements are Fluid and Coordinated: No Fine Motor Movements are Fluid and Coordinated: Yes Motor  Motor Motor: Abnormal postural alignment and control Motor - Discharge Observations: In seated, pt exhibits tendency toward posterior pelvic tilt, R side unweighted. Able to self-correct posture with min verbal cueing.   Mobility Bed Mobility Bed Mobility: Rolling Right;Rolling Left;Supine to Sit;Scooting to [facility];Sit to Supine Rolling Right: 6: Modified independent (Device/Increase time);7: Independent;5: Supervision Rolling Right Details (indicate cue type and reason): Assist required with bed mobility varies from supervision to independence, depending on pt attention. Rolling Left: 6: Modified independent (Device/Increase time);7: Independent;5: Supervision Rolling Left Details (indicate cue type and reason): Assist required with bed mobility varies from supervision to independence, depending on pt attention. Supine to Sit: 6: Modified independent (Device/Increase time);HOB flat;With  rails Sitting - Scoot to Edge of Bed: 6: Modified independent (Device/Increase time) Sit to Supine: HOB flat;7: Independent;6: Modified independent (Device/Increase time);With rail Sit to Supine - Details (indicate cue type and reason): Assist varies from mod I (bed rail) to independent. Scooting to [facility]: 6: Modified independent (Device/Increase time);With rail Transfers Transfers: Yes Sit to Stand: 5: Supervision Stand to Sit: 5: Supervision Stand to Sit Details: Supervision for stability/balance Stand Pivot Transfers: 5: Supervision Stand Pivot Transfer Details (indicate cue type and reason): Supervision for stability/balance Squat Pivot Transfers: 5: Supervision Squat Pivot Transfer Details (indicate cue type and reason): Supervision to ensure proper setup, stability/balance Locomotion  Ambulation Ambulation/Gait Assistance: 4: Min guard Wheelchair Mobility Distance: 150  Trunk/Postural Assessment  Thoracic Assessment Thoracic Assessment: Within Functional Limits Lumbar Assessment Lumbar Assessment: Exceptions to [facility] (h/o spinal surgery, spinal stenosis) Postural Control Postural Control: Within Functional Limits  Balance Balance Balance Assessed: Yes Static Sitting Balance Static Sitting - Balance Support: Feet supported;No upper extremity supported Static Sitting - Level of Assistance: 7: Independent Static Sitting - Comment/# of Minutes: Static sitting EOB x4 minutes, independent. Dynamic Sitting Balance Dynamic Sitting - Level of Assistance: 5: Stand by assistance;6: Modified independent (Device/Increase time) Dynamic Sitting - Balance Activities: Forward lean/weight shifting;Reaching for objects Dynamic Sitting - Comments: Management of w/c brakes, leg rests x6 minutes with SBA-mod I Static Standing Balance Static Standing - Balance Support: No upper extremity supported Static Standing - Level of Assistance: 5: Stand by assistance Static Standing - Comment/# of Minutes:  3 minutes Dynamic Standing Balance Dynamic Standing - Balance Support: No upper extremity supported Dynamic Standing - Level of Assistance: 4: Min assist Dynamic Standing - Balance Activities: Reaching for objects;Forward lean/weight shifting;Lateral lean/weight shifting Dynamic Standing - Comments: 2.5 minutes with min guard Extremity Assessment  RUE Assessment RUE Assessment: Within Functional Limits LUE Assessment LUE Assessment: Within Functional Limits RLE Assessment RLE Assessment: Within Functional Limits LLE Assessment LLE Assessment: Within Functional Limits  See FIM for current functional status  Calvert Cantor 04/05/2013, 8:04 PM

## 2013-04-05 NOTE — Plan of Care (Signed)
Problem: RH BLADDER ELIMINATION Goal: RH STG MANAGE BLADDER WITH ASSISTANCE STG Manage Bladder With min Assistance with voiding, if home with indwelling catheter min assist for maintaince by patient and/or caregiver  Outcome: Progressing Pt voiding, has not required in and out cath this shift so far today

## 2013-04-05 NOTE — Plan of Care (Signed)
Problem: RH SKIN INTEGRITY Goal: RH STG SKIN FREE OF INFECTION/BREAKDOWN Skin free of infection and will have skin breakdown managed with total assist while in Rehab for breakdown on sacrum  Outcome: Progressing Air mattress overlay, alleyvn dressing to sacrum, antifungal powder to inner groins and scrotom

## 2013-04-05 NOTE — Progress Notes (Signed)
04/05/13 0815  Wound  Date First Assessed/Time First Assessed: 03/01/13 0330   Location: Sacrum  Location Orientation: Mid  Wound Description (Comments): stage 2 on the mid sacrum  Present on Admission: Yes  Site / Wound Assessment Clean;Dry;Purple;Pink  Dressing Type Foam  Dressing Changed Changed  Dressing Status Clean;Dry;Intact

## 2013-04-05 NOTE — Discharge Summary (Signed)
NAME:  Walter Larsen, Walter Larsen                  ACCOUNT NO.:  [id number]  MEDICAL RECORD NO.:  [id number]  LOCATION:  4M04C                        FACILITY:  [facility]  PHYSICIAN:  Erick Colace, M.D.DATE OF BIRTH:  1936-03-02  DATE OF ADMISSION:  03/15/2013 DATE OF DISCHARGE:  04/06/2013                              DISCHARGE SUMMARY   DISCHARGE DIAGNOSES: 1. Right temporoparietal intracranial hemorrhage as well as right     occipital infarction. 2. Pulmonary emboli with deep vein thrombosis, right common femoral     vein and peroneal vein as well as left peroneal vein status post     Greenfield filter, 03/08/2013. 3. Pain management. 4. Hypertension. 5. Hyperlipidemia. 6. Benign prostatic hypertrophy with urinary retention. 7. Coronary artery disease with percutaneous transluminal coronary     angioplasty. 8. Insomnia. 9. Sacral decubitus.  HISTORY OF PRESENT ILLNESS:  This is a 77 year old right-handed male, multi-medical, recent admission to [facility] with hemorrhagic stroke, discharged on February 27, 2013, after a 3-day hospital stay, presented with increased headache, nausea, as well as hallucination, altered mental status.  Cranial CT scan showed right temporooccipital parenchymal hematoma, measuring up to 7 cm in light of trace intraventricular extension without hydrocephalus.  Placed on vancomycin and Zosyn for fever 100.3, white blood cell count 15,000.  MRI completed on March 05, 2013, during workup of fever, rule out abscess, that showed a large subacute hematoma, right temporoparietal lobe as well as small areas of acute infarct in the right occipitoparietal lobe and left temporoparietal lobe.  Neurosurgery consulted, advised conservative care.  TEE showed no thrombus or PFO.  Infectious Disease consulted for fever of unknown origin with workup ongoing.  CT of the chest on March 08, 2013, showed bilateral lobar and segmental pulmonary emboli and bilateral  lower extremity Doppler showed DVT, right common femoral vein and right peroneal vein.  Underwent removable Greenfield filter on March 08, 2013, per Interventional Radiology.  The patient was placed on Depakote for headache as well as seizure prevention.  Bouts of urinary retention.  Foley catheter tube inserted x2 for plan of voiding trial.  Physical and occupational therapy ongoing.  The patient was admitted for comprehensive rehab program.  PAST MEDICAL HISTORY:  See discharge diagnoses.  SOCIAL HISTORY:  Lives with spouse.  FUNCTIONAL HISTORY:  Prior to admission, independent prior to admission to [facility].  Functional status upon admission to [facility], +2 total assist for stand pivot transfers.  PHYSICAL EXAMINATION:  VITAL SIGNS:  Blood pressure 126/58, pulse 80, temperature 98, respirations 19. GENERAL:  This was an alert male, flat affect, but appropriate.  Made good eye contact with examiner.  He follows simple commands.  Pupils were round and reactive to light. LUNGS:  Clear to auscultation. CARDIAC:  Regular rate and rhythm. ABDOMEN:  Soft, nontender.  Good bowel sounds.  REHABILITATION HOSPITAL COURSE:  The patient was admitted to inpatient Rehab Services with therapies initiated on a 3-hour daily basis, consisting of physical therapy, occupational therapy, and rehabilitation nursing.  The following issues were addressed during the patient's rehabilitation stay.  Pertaining to [name]'s right temporoparietal intracranial hemorrhage as well as right occipital infarct, remained stable  with conservative care by both Neurosurgery and Neurology Services.  Hospital course complicated by pulmonary emboli, DVT, with a Greenfield filter placed on March 08, 2013, by Interventional Radiology Services.  Blood pressures remained controlled with Lasix as well as Lopressor.  Bouts of insomnia with Seroquel added at bedtime with good results.  He had no chest pain or  shortness of breath.  He had been on aspirin prior to admission for his history of coronary artery disease, discontinued secondary to hemorrhage.  Ritalin was added to his regimen during his therapy courses to help focus better on tasks as well as attention with good results.  He did have a history of benign prostatic hypertrophy.  A Foley catheter tube removed on March 21, 2013, for voiding trial.  Occasional spontaneous voids with Flomax added, as well as in and out catheterizations.  Family had decided to stay with intermittent catheterizations versus indwelling Foley catheter tube and monitor as mobility improved.  He did have a small sacral decubitus foam dressing in place as well as air mattress.  Wound was healing nicely.  The patient received weekly collaborative interdisciplinary team conferences to discuss estimated length of stay, family teaching, and any barriers to his discharge.  He was mod assist upper body dressing, mod-to-min assist lower body dressing, minimal assist functional transfers, minimal assist toilet tasks, minimal assist bathing, supervision minimal assist with bed mobility, min assist for bed to chair, min-to-mod assist for gait and stair negotiations.  His strength and endurance did continue to improve.  There was some concerns on necessary assistance at home by his wife and granddaughter, they felt they could provide the necessary care.  The plan was discharged home on December, 24, 2014.  DISCHARGE MEDICATIONS:  At the time of dictation included: 1. Lipitor 20 mg p.o. daily. 2. Depakote 500 mg p.o. daily. 3. Lasix 20 mg p.o. daily. 4. Ritalin 5 mg p.o. b.i.d. 5. Lopressor 50 mg p.o. b.i.d. 6. Protonix 40 mg p.o. daily. 7. Seroquel 25 mg p.o. q.h.s. 8. Flomax 0.4 mg daily.  DIET:  Mechanical soft thin liquids.  SPECIAL INSTRUCTIONS:  The patient would follow up with Dr. Alinda Deem, primary care provider, [place],  appointment made; [facility] Claudette Laws [facility], 2013; Dr. Barnett Abu, Neurosurgery, call for appointment as needed; Dr. Pearlean Brownie, Neurology Service, call for appointment in 1 month; and Dr. Maryclare Bean in regard to plan of removing Greenfield filter for history of DVT, pulmonary emboli in the future.  Special instructions also in and out catheterizations as directed for BPH.     Mariam Dollar, P.A.   ______________________________ Erick Colace, M.D.    DA/MEDQ  D:  04/05/2013  T:  04/05/2013  Job:  [id number]  cc:   Alinda Deem, MD Stefani Dama, M.D. Pramod P. Pearlean Brownie, MD Art A. Hoss, M.D.

## 2013-04-05 NOTE — Discharge Summary (Signed)
Discharge summary job # [id number]

## 2013-04-05 NOTE — Progress Notes (Signed)
Social Work Patient ID: Walter Larsen, male   DOB: 1936-01-01, 77 y.o.   MRN: [id number] Family has worked out 24 hour care and plan to take pt home tomorrow.  Granddaughter here to learn his care today. Will make discharge arrangements for tomorrow.

## 2013-04-06 MED ORDER — METHYLPHENIDATE HCL 5 MG PO TABS: 5.0000 mg | ORAL_TABLET | Freq: Two times a day (BID) | ORAL | Status: DC

## 2013-04-06 MED ORDER — TAMSULOSIN HCL 0.4 MG PO CAPS: 0.4000 mg | ORAL_CAPSULE | Freq: Every day | ORAL | Status: DC

## 2013-04-06 MED ORDER — DIVALPROEX SODIUM ER 500 MG PO TB24: 500.0000 mg | ORAL_TABLET | Freq: Every day | ORAL | Status: DC

## 2013-04-06 MED ORDER — QUETIAPINE FUMARATE 25 MG PO TABS: 25.0000 mg | ORAL_TABLET | Freq: Every day | ORAL | Status: DC

## 2013-04-06 MED ORDER — FUROSEMIDE 20 MG PO TABS: 20.0000 mg | ORAL_TABLET | Freq: Every day | ORAL | Status: DC

## 2013-04-06 MED ORDER — METOPROLOL TARTRATE 50 MG PO TABS: 50.0000 mg | ORAL_TABLET | Freq: Two times a day (BID) | ORAL | Status: DC

## 2013-04-06 MED ORDER — ATORVASTATIN CALCIUM 20 MG PO TABS: 20.0000 mg | ORAL_TABLET | Freq: Every day | ORAL | Status: DC

## 2013-04-06 NOTE — Progress Notes (Signed)
Social Work Patient ID: Walter Larsen, male   DOB: [birth year], 77 y.o.   MRN: [id number] Pt requires a lightweight wheelchair to be able to self propel and uses for his self care tasks.  He is unable to self propel a standard wheelchair. Family education completed ready for discharge today.  Pt ready to go home.

## 2013-04-06 NOTE — Progress Notes (Signed)
77 y.o. right-handed male with history of hypertension, CAD with PTCA. Admit 03/01/2013 with altered mental status and fever. Recent admission to [facility] with hemorrhagic stroke and discharged 02/27/2013 after a three-day hospital stay. Presented with increased headache and nausea as well as hallucinations and altered mental status. Cranial CT scan showed right temporal occipital parenchymal hematoma measuring up to 7 cm in light of trace intraventricular extension without hydrocephalus. Patient was placed on vancomycin /Zosyn for fever of 100.3 white blood cell count 15,000. An MRI was completed 03/05/2013 during workup of fever rule out abscess that showed a large subacute hematoma right temporoparietal lobe as well small areas of acute infarct in the right occipital parietal lobe and left temporal parietal lobe. Neurosurgery Dr. Danielle Dess consulted advise conservative care in reference of right parietal ICH. TEE completed showing no thrombus without PFO no vegetation. Infectious disease consult and for fever of unknown origin with workup ongoing including CT of the chest 03/08/2013 showed bilateral lobar and segmental pulmonary emboli and bilateral lower extremity Doppler studies consistent with deep vein thrombosis involving the right common femoral vein and right peroneal vein as well as deep vein thrombosis left peroneal vein. Underwent removable Greenfield filter 03/08/2012 per interventional radiology. Depakote was added for headache as well as seizure prevention.  Subjective/Complaints: Eating breakfast with wife. Feeling well. Excited to go home.   Review of Systems - poor awareness of deficits but improving  Objective: Vital Signs: Blood pressure 117/56, pulse 65, temperature 98.7 F (37.1 C), temperature source Oral, resp. rate 16, height 5\' 8"  (1.727 m), weight 85.821 kg (189 lb 3.2 oz), SpO2 96.00%. No results found. No results found for this or any previous visit (from the past  72 hour(s)).   HEENT: normal and oral mucosa dry Cardio: RRR and no murmur Resp: CTA B/L and unlabored GI: BS positive and non tender Extremity:  Pulses positive and 2+ pretibial Edema Skin: decub sacrum with slit, no drainage, clean, healthy tissue Neuro: Awakens to voice, Cranial Nerve II-XII normal, Abnormal Sensory  Poor attention to task, Normal Motor, Abnormal FMC Ataxic/ dec FMC and Other Musc/Skel:  Normal General no acute distress Mood and Affect bright. Improving awareness and insight.   Assessment/Plan: 1. Functional deficits secondary to R temporal/occipital IPH as well as acute infarcts in R occipital/parietal and L temporal/ prieateal which require 3+ hours per day of interdisciplinary therapy in a comprehensive inpatient rehab setting. Physiatrist is providing close team supervision and 24 hour management of active medical problems listed below. Physiatrist and rehab team continue to assess barriers to discharge/monitor patient progress toward functional and medical goals.  Has made nice progress. Home today with supervision. Education completed   FIM: FIM - Bathing Bathing Steps Patient Completed: Chest;Right Arm;Left Arm;Abdomen;Front perineal area;Left upper leg;Right lower leg (including foot);Left lower leg (including foot);Buttocks;Right upper leg Bathing: 5: Supervision: Safety issues/verbal cues  FIM - Upper Body Dressing/Undressing Upper body dressing/undressing steps patient completed: Put head through opening of pull over shirt/dress;Pull shirt over trunk;Thread/unthread right sleeve of pullover shirt/dresss;Thread/unthread left sleeve of pullover shirt/dress Upper body dressing/undressing: 5: Set-up assist to: Obtain clothing/put away FIM - Lower Body Dressing/Undressing Lower body dressing/undressing steps patient completed: Pull pants up/down;Don/Doff left sock;Thread/unthread right pants leg;Thread/unthread left pants leg;Thread/unthread right underwear  leg;Thread/unthread left underwear leg;Pull underwear up/down;Don/Doff right sock;Don/Doff left shoe;Fasten/unfasten right shoe;Don/Doff right shoe Lower body dressing/undressing: 4: Min-Patient completed 75 plus % of tasks  FIM - Toileting Toileting steps completed by patient: Adjust clothing prior to toileting;Adjust  clothing after toileting;Performs perineal hygiene Toileting Assistive Devices: Grab bar or rail for support Toileting: 5: Supervision: Safety issues/verbal cues  FIM - Diplomatic Services operational officer Devices: Grab bars Toilet Transfers: 5-To toilet/BSC: Supervision (verbal cues/safety issues);5-From toilet/BSC: Supervision (verbal cues/safety issues)  FIM - Press photographer Assistive Devices: Arm rests;Bed rails Bed/Chair Transfer: 6: Supine > Sit: No assist;5: Bed > Chair or W/C: Supervision (verbal cues/safety issues);6: Sit > Supine: No assist;5: Chair or W/C > Bed: Supervision (verbal cues/safety issues)  FIM - Locomotion: Wheelchair Distance: 150 Locomotion: Wheelchair: 5: Travels 150 ft or more: maneuvers on rugs and over door sills with supervision, cueing or coaxing FIM - Locomotion: Ambulation Locomotion: Ambulation Assistive Devices: Other (comment) (HHA) Ambulation/Gait Assistance: 4: Min guard Locomotion: Ambulation: 2: Travels 50 - 149 ft with minimal assistance (Pt.>75%)  Comprehension Comprehension Mode: Auditory Comprehension: 2-Understands basic 25 - 49% of the time/requires cueing 51 - 75% of the time  Expression Expression Mode: Verbal Expression: 3-Expresses basic 50 - 74% of the time/requires cueing 25 - 50% of the time. Needs to repeat parts of sentences.  Social Interaction Social Interaction: 2-Interacts appropriately 25 - 49% of time - Needs frequent redirection.  Problem Solving Problem Solving: 2-Solves basic 25 - 49% of the time - needs direction more than half the time to initiate, plan or complete  simple activities  Memory Memory: 2-Recognizes or recalls 25 - 49% of the time/requires cueing 51 - 75% of the time  Medical Problem List and Plan:  1. Right temporal parietal intracranial hemorrhage as well as right occipital infarct mental status slowly improving but has waxing /waning as expected 2. DVT Prophylaxis/Anticoagulation: Pulmonary emboli/DVT right common femoral vein and peroneal vein, left peroneal vein. Status post Greenfield filter 03/08/2013   3. Pain Management/headaches. Depakote 500 mg daily, may be able to taper further prior to D/C, analgesic creme and heat for neck pain  4. Neuropsych: This patient is capable of making decisions on his own behalf.  5. ID. All antibiotics discontinued 03/08/2013 with workup unremarkable except for signs of DVT /PE , no source identified, WBCs decreasing , repeat UA C and S neg,  6. Hypertension. Normotensive off Norvasc 5 mg daily,still taking  Lopressor 50 mg twice a day. Monitor with increased mobility  7. Hyperlipidemia. Lipitor  8. BPH. Flomax.  ---voiding better as a whole. HH rn can follow up 9. CAD/PTCA. Aspirin prior to admission discontinued secondary to hemorrhage. No chest pain or shortness of breath  10. Urinary retention. Voiding trial going well, nop caths overnite -UCx negative 11. Insomnia/night time restlessness--reduce seroquel tonight 25 more alert this am 12.  Edema BLE no pain or redness,has bilateral DVTs and IVC filter  albumin is mildly reduced, ECHO 03/07/2013, "normal EF, no sig valvular disease, hold norvasc, Lasix not helping will d/c, use TEDs 13.  Sacral decub, sidelying , Foam dressing, pressure relief. Wound healing nicely  -hh follow up  LOS (Days) 22 A FACE TO FACE EVALUATION WAS PERFORMED  SWARTZ,ZACHARY T 04/06/2013, 8:47 AM

## 2013-04-06 NOTE — Progress Notes (Signed)
Social Work Discharge Note Discharge Note  The overall goal for the admission was met for:   Discharge location: Yes-HOME WITH FAMILY WHO CAN PROVIDE 24 HR CARE  Length of Stay: Yes-22 DAYS  Discharge activity level: Yes-SUPERVISION/MIN LEVEL  Home/community participation: Yes  Services provided included: MD, RD, PT, OT, SLP, RN, TR, Pharmacy and Neuropsych  Financial Services: Private Insurance: [facility]  Follow-up services arranged: Home Health: [facility]-PT,OT,RN, SPT, DME: [facility] and Patient/Family has no preference for HH/DME agencies  Comments (or additional information):GRANDDAUGTHER COMPLETED FAMILY EDUCATION, WIFE CAN DO SOME OF PT'S CARE. AWARE OF OTHER D/C OPTIONS WANTED TO  TAKE PT HOME.  Patient/Family verbalized understanding of follow-up arrangements: Yes  Individual responsible for coordination of the follow-up plan: DAVID-SON & LINDA-WIFE  Confirmed correct DME delivered: Lucy Chris 04/06/2013    Lucy Chris

## 2013-04-26 DIAGNOSIS — I2699 Other pulmonary embolism without acute cor pulmonale: Secondary | ICD-10-CM

## 2013-04-26 DIAGNOSIS — I69998 Other sequelae following unspecified cerebrovascular disease: Secondary | ICD-10-CM

## 2013-04-26 DIAGNOSIS — R269 Unspecified abnormalities of gait and mobility: Secondary | ICD-10-CM

## 2013-04-26 DIAGNOSIS — I69919 Unspecified symptoms and signs involving cognitive functions following unspecified cerebrovascular disease: Secondary | ICD-10-CM

## 2013-05-03 ENCOUNTER — Other Ambulatory Visit: Payer: Self-pay | Admitting: Physician Assistant

## 2013-05-03 DIAGNOSIS — Z95828 Presence of other vascular implants and grafts: Secondary | ICD-10-CM

## 2013-05-03 DIAGNOSIS — I2699 Other pulmonary embolism without acute cor pulmonale: Secondary | ICD-10-CM

## 2013-05-05 ENCOUNTER — Encounter: Payer: Self-pay | Admitting: Physical Medicine and Rehabilitation

## 2013-05-05 ENCOUNTER — Encounter: Payer: Medicare PPO | Attending: Physical Medicine and Rehabilitation | Admitting: Physical Medicine and Rehabilitation

## 2013-05-05 ENCOUNTER — Inpatient Hospital Stay: Payer: Medicare Other | Admitting: Physical Medicine & Rehabilitation

## 2013-05-05 VITALS — BP 117/68 | HR 67 | Resp 14 | Ht 68.0 in | Wt 175.0 lb

## 2013-05-05 DIAGNOSIS — I82409 Acute embolism and thrombosis of unspecified deep veins of unspecified lower extremity: Secondary | ICD-10-CM

## 2013-05-05 DIAGNOSIS — R269 Unspecified abnormalities of gait and mobility: Secondary | ICD-10-CM | POA: Insufficient documentation

## 2013-05-05 DIAGNOSIS — I619 Nontraumatic intracerebral hemorrhage, unspecified: Secondary | ICD-10-CM | POA: Insufficient documentation

## 2013-05-05 DIAGNOSIS — I2699 Other pulmonary embolism without acute cor pulmonale: Secondary | ICD-10-CM

## 2013-05-05 DIAGNOSIS — R3911 Hesitancy of micturition: Secondary | ICD-10-CM | POA: Insufficient documentation

## 2013-05-05 DIAGNOSIS — G4700 Insomnia, unspecified: Secondary | ICD-10-CM | POA: Insufficient documentation

## 2013-05-05 DIAGNOSIS — R279 Unspecified lack of coordination: Secondary | ICD-10-CM | POA: Insufficient documentation

## 2013-05-05 DIAGNOSIS — I6350 Cerebral infarction due to unspecified occlusion or stenosis of unspecified cerebral artery: Secondary | ICD-10-CM | POA: Insufficient documentation

## 2013-05-05 DIAGNOSIS — I69919 Unspecified symptoms and signs involving cognitive functions following unspecified cerebrovascular disease: Secondary | ICD-10-CM

## 2013-05-05 DIAGNOSIS — I69998 Other sequelae following unspecified cerebrovascular disease: Secondary | ICD-10-CM

## 2013-05-05 MED ORDER — TAMSULOSIN HCL 0.4 MG PO CAPS: 0.4000 mg | ORAL_CAPSULE | Freq: Every day | ORAL | Status: DC

## 2013-05-05 NOTE — Patient Instructions (Signed)
1. Call your primary care regarding refill on Seroquel or alternative sleeping/anxiety medication. 2. Call Dr. Ellene Route and Dr. Leonie Man for follow up appointment--decision about blood thinner.

## 2013-05-05 NOTE — Progress Notes (Signed)
Subjective:    Patient ID: Walter Larsen, male    DOB: 16-Dec-1935, 78 y.o.   MRN: [id number]  CC: Follow up on Right temporoparietal lobe hematoma and right occipital parietal lobe and left temporal parietal lobe infarcts.   HPI Walter Larsen is a 78 y.o. right-handed male with history of hypertension, CAD with PTCA,  Hemorrhagic stroke treated at [facility] with d/c to home 02/27/13. He was admitted to [facility]  03/01/2013 with altered mental status, nausea/vomiting, hallucinations and fever. Cranial CT scan showed right temporal occipital parenchymal hematoma measuring up to 7 cm in light of trace intraventricular extension without hydrocephalus. He was treated with broad spectrum antiboitics for fever with leucocytosis.  MRI  Brain 03/05/2013 showed a large subacute hematoma right temporoparietal lobe as well small areas of acute infarct in the right occipital parietal lobe and left temporal parietal lobe. Neurosurgery Dr. Ellene Route consulted advise conservative care in reference of right parietal ICH. TEE completed showing no thrombus without PFO no vegetation.  CT of the chest 03/08/2013 showed bilateral lobar and segmental pulmonary emboli and BLE dopplers showed DVTs involving  the right common femoral vein and right peroneal vein as well as deep vein thrombosis left peroneal vein.  Greenfield filter placed 03/08/2012 by IVR and  Depakote was added for headache as well as seizure prevention. He has had urinary retention requiring inswBouts of urinary retention Foley catheter tube inserted x2. Plan is to try a voiding trial as mobility improves. Patient is on a regular consistency diet. Physical and occupational therapy evaluations completed 03/08/2013 with recommendations of physical medicine rehabilitation consult to consider inpatient rehabilitation services.   He was admitted to rehab for inpatient program 03/15/13-04/06/13. Bladder function improved on flomax and he was voiding without difficulty.  Placed on  seroquel to help with night time restlessness as well as insomnia. On ritalin for attention and tolerating this without difficulty. He was discharged to home on 04/07/75 at  [facility] assist level for ambulation and moderate assist for cognitive tasks such as problem solving and attention. He has completed HHPT/OT and is to be evaluated for outpatient therapies tomorrow. Is ambulating at home with use of wife's walker.   Pain Inventory Average Pain 0 Pain Right Now 0 My pain is intermittent  In the last 24 hours, has pain interfered with the following? General activity 0 Relation with others 0 Enjoyment of life 0 What TIME of day is your pain at its worst? n/a Sleep (in general) Poor  Pain is worse with: n/a Pain improves with: n/a Relief from Meds: n/a  Mobility use a walker how many minutes can you walk? 5 ability to climb steps?  yes do you drive?  no Do you have any goals in this area?  yes  Function retired I need assistance with the following:  household duties and shopping Do you have any goals in this area?  yes  Neuro/Psych bladder control problems weakness trouble walking confusion depression  Prior Studies Any changes since last visit?  no  Physicians involved in your care Primary care [name]   Family History  Problem Relation Age of Onset  . Hypertension Mother   . Hypertension Father    History   Social History  . Marital Status: Single    Spouse Name: N/A    Number of Children: N/A  . Years of Education: N/A   Social History Main Topics  . Smoking status: Never Smoker   . Smokeless tobacco: None  . Alcohol Use: No  .  Drug Use: No  . Sexual Activity: None   Other Topics Concern  . None   Social History Narrative  . None   Past Surgical History  Procedure Laterality Date  . Tee without cardioversion N/A 03/07/2013    Procedure: TRANSESOPHAGEAL ECHOCARDIOGRAM (TEE);  Surgeon: Fay Records, MD;  Location: [facility] ENDOSCOPY;  Service:  Cardiovascular;  Laterality: N/A;   Past Medical History  Diagnosis Date  . Stroke   . Coronary artery disease     3 stents   BP 117/68  Pulse 67  Resp 14  Ht 5\' 8"  (1.727 m)  Wt 175 lb (79.379 kg)  BMI 26.61 kg/m2  SpO2 97%     Review of Systems  Respiratory: Negative for shortness of breath.   Cardiovascular: Negative for chest pain and leg swelling.  Genitourinary: Negative for difficulty urinating.  Musculoskeletal: Positive for back pain and gait problem.  Neurological: Positive for weakness (endurance slowly improving. ). Negative for dizziness.  Psychiatric/Behavioral: Positive for sleep disturbance (seroquel discontinued by PMD few days ago.) and dysphoric mood (reports family concerns).  All other systems reviewed and are negative.       Objective:   Physical Exam  Nursing note and vitals reviewed. Constitutional: He is oriented to person, place, and time. He appears well-developed and well-nourished.  Appropriately dressed and well groomed. Flat affect. Makes minimal eye contact.   HENT:  Head: Normocephalic and atraumatic.  Eyes: Conjunctivae are normal. Pupils are equal, round, and reactive to light.  Neck: Normal range of motion. Neck supple.  Cardiovascular: Normal rate and regular rhythm.   Pulmonary/Chest: Effort normal and breath sounds normal. No respiratory distress. He has no wheezes.  Abdominal: Soft. Bowel sounds are normal. He exhibits no distension. There is no tenderness.  Musculoskeletal: He exhibits no edema and no tenderness.  Neurological: He is alert and oriented to person, place, and time.  Appears to have intact basic insight and awareness. Interacts appropriately and made a few comments about finances due to not working. His  motor strength: 5/5 in the right deltoid, bicep, tricep, grip, hip flexor, knee extensors, ankle dorsiflexor plantar flexor   3+/5 in the left deltoid, bicep, tricep, grip. He is 3_ to 4- in left hip flexor, knee  extensors, ankle dorsi flexion plantar flexor   Sensation intact to light touch and pain  Cerebellar shows some decrease in fine motor control with left  finger-nose-finger testing. Visual fields show left field cut on confrontational testing--this does not appear complete, but at least 50% (outer half) of left visual field.    Skin: Skin is warm and dry.          Assessment & Plan:  1.R temporal/occipital IPH as well as acute infarcts in R occipital/parietal and L temporal/ parietal region:  To continue outpatient PT therapy for balance and strengthening.  Insight, awareness and problem solving is improving. Ritalin was refilled by PMD.   He is using his wife's walker which is too short and she cannot do HHA due to using a cane and balance deficits. No falls or near falls. Their house is too small for a wheelchair. Advised to continue to use walker and have PT at outpatient evaluate for appropriate AD.   2. PE/DVT: Has an appointment with Dr. Barbie Banner next week but patient and wife do not want filter removed due to concerns of recurrent complications and lack of treatment.  Advised them to call and discuss with radiology. Also advised to set follow up  with Dr. Leonie Man and Dr. Ellene Route who can give clarification on anticoagulation.   3. Hesitancy:  Out of flomax--Refilled Flomax 0.4mg  # 30 pills--use daily in am. Further refills per PMD.

## 2013-05-10 ENCOUNTER — Other Ambulatory Visit: Payer: Medicare Other

## 2013-05-10 ENCOUNTER — Encounter (INDEPENDENT_AMBULATORY_CARE_PROVIDER_SITE_OTHER): Payer: Self-pay

## 2013-05-10 ENCOUNTER — Encounter: Payer: Self-pay | Admitting: Nurse Practitioner

## 2013-05-10 ENCOUNTER — Ambulatory Visit (INDEPENDENT_AMBULATORY_CARE_PROVIDER_SITE_OTHER): Payer: Medicare HMO | Admitting: Nurse Practitioner

## 2013-05-10 VITALS — BP 117/69 | HR 69 | Ht 68.0 in | Wt 175.0 lb

## 2013-05-10 DIAGNOSIS — Z95828 Presence of other vascular implants and grafts: Secondary | ICD-10-CM

## 2013-05-10 DIAGNOSIS — Z9889 Other specified postprocedural states: Secondary | ICD-10-CM

## 2013-05-10 DIAGNOSIS — H53462 Homonymous bilateral field defects, left side: Secondary | ICD-10-CM | POA: Insufficient documentation

## 2013-05-10 DIAGNOSIS — H53469 Homonymous bilateral field defects, unspecified side: Secondary | ICD-10-CM

## 2013-05-10 DIAGNOSIS — I619 Nontraumatic intracerebral hemorrhage, unspecified: Secondary | ICD-10-CM

## 2013-05-10 DIAGNOSIS — I2699 Other pulmonary embolism without acute cor pulmonale: Secondary | ICD-10-CM

## 2013-05-10 MED ORDER — DIVALPROEX SODIUM ER 500 MG PO TB24: 500.0000 mg | ORAL_TABLET | Freq: Every day | ORAL | Status: DC

## 2013-05-10 NOTE — Patient Instructions (Signed)
PLAN: Continue Depakote ER 500 mg oral once a day for seizure prevention. Start back on an Aspirin 81mg  daily.   Maintain strict control of hypertension with blood pressure goal below 130/90, diabetes with hemoglobin A1c goal below 7% and lipids with LDL cholesterol goal below 100 mg/dL.  Followup in the future with me in 3-4 months.   It is recommended that the patient first drives with another licensed driver in an empty parking lot. If the patient does well with this, and they can drive on a quiet street with the licensed driver. If the patient does well with this they can drive on a busy street with a licensed driver. If the patient does well with this, the next time out they can go by himself. For the first month after resuming driving, I recommend no nighttime or Interstate driving.

## 2013-05-10 NOTE — Progress Notes (Signed)
PATIENT: Walter Larsen DOB: 26-Mar-1936   REASON FOR VISIT: follow up for stroke, post-hospital HISTORY FROM: patient  HISTORY OF PRESENT ILLNESS: Bradley Bostelman is a 78 y.o. male with Past medical history of hypertension, dyslipidemia, recent admission to [facility] with hemorrhagic stroke. The patient is admitted for altered mental status, hallucinations and confabulation. Patient also had some frontal headache, some nausea and an episode of urinary incontinence.  Cranial CT scan showed right temporal occipital parenchymal hematoma measuring up to 7 cm in light of trace intraventricular extension without hydrocephalus. He was treated with broad spectrum antiboitics for fever with leucocytosis. MRI Brain 03/05/2013 showed a large subacute hematoma right temporoparietal lobe as well small areas of acute infarct in the right occipital parietal lobe and left temporal parietal lobe. Neurosurgery Dr. Ellene Route consulted advise conservative care in reference of right parietal ICH. TEE completed showing no thrombus without PFO no vegetation. CT of the chest 03/08/2013 showed bilateral lobar and segmental pulmonary emboli and BLE dopplers showed DVTs involving the right common femoral vein and right peroneal vein as well as deep vein thrombosis left peroneal vein. Greenfield filter placed 03/08/2012 by IVR and Depakote was added for headache as well as seizure prevention. He was admitted to rehab for inpatient program 03/15/13-04/06/13.  Placed on seroquel to help with night time restlessness as well as insomnia. On ritalin for attention and tolerating this without difficulty. He was discharged to home on 04/06/14 at [facility] assist level for ambulationHe has completed HHPT/OT and is to be evaluated for outpatient therapies tomorrow. Is ambulating at home with use of wife's walker.  BP in office today is 117/69.  He states he is doing well since hospital discharge.  He had home health PT,OT, and ST for 2-3 weeks  after discharge.  Left homonymous hemianopsia remains. His cognitive functioning is subjectively slower; improving.  REVIEW OF SYSTEMS: Full 14 system review of systems performed and notable only for:  Constitutional: fatigue Cardiovascular: swelling in legs  Ear/Nose/Throat: ringing in ears  Genitourinary: impotence Neurological: memory loss, confusion Psychiatric: not enough sleep  ALLERGIES: Allergies  Allergen Reactions  . Ativan [Lorazepam]     confusion    HOME MEDICATIONS: Outpatient Prescriptions Prior to Visit  Medication Sig Dispense Refill  . atorvastatin (LIPITOR) 20 MG tablet Take 1 tablet (20 mg total) by mouth at bedtime.  30 tablet  1  . esomeprazole (NEXIUM) 40 MG capsule Take 40 mg by mouth daily before breakfast.      . fenofibrate (TRICOR) 145 MG tablet Take 145 mg by mouth at bedtime.      . furosemide (LASIX) 20 MG tablet Take 1 tablet (20 mg total) by mouth daily.  30 tablet  1  . Glucosamine-Chondroitin (OSTEO BI-FLEX REGULAR STRENGTH PO) Take 1 tablet by mouth daily with breakfast.      . methylphenidate (RITALIN) 5 MG tablet Take 1 tablet (5 mg total) by mouth 2 (two) times daily with breakfast and lunch.  60 tablet  0  . metoprolol (LOPRESSOR) 50 MG tablet Take 1 tablet (50 mg total) by mouth 2 (two) times daily.  60 tablet  1  . Multiple Vitamin (MULTIVITAMIN WITH MINERALS) TABS tablet Take 1 tablet by mouth daily with breakfast.      . Omega-3 Fatty Acids (FISH OIL PO) Take 1 capsule by mouth daily with breakfast.      . tamsulosin (FLOMAX) 0.4 MG CAPS capsule Take 1 capsule (0.4 mg total) by mouth daily after breakfast.  30 capsule  0  . divalproex (DEPAKOTE ER) 500 MG 24 hr tablet Take 1 tablet (500 mg total) by mouth daily.  30 tablet  1   No facility-administered medications prior to visit.    PAST MEDICAL HISTORY: Past Medical History  Diagnosis Date  . Stroke   . Coronary artery disease     3 stents    PAST SURGICAL HISTORY: Past Surgical  History  Procedure Laterality Date  . Tee without cardioversion N/A 03/07/2013    Procedure: TRANSESOPHAGEAL ECHOCARDIOGRAM (TEE);  Surgeon: Pricilla Riffle, MD;  Location: [facility] ENDOSCOPY;  Service: Cardiovascular;  Laterality: N/A;    FAMILY HISTORY: Family History  Problem Relation Age of Onset  . Hypertension Mother   . Hypertension Father     SOCIAL HISTORY: History   Social History  . Marital Status: Single    Spouse Name: N/A    Number of Children: 2  . Years of Education: 12th   Occupational History  . retired    Social History Main Topics  . Smoking status: Never Smoker   . Smokeless tobacco: Not on file  . Alcohol Use: No  . Drug Use: No  . Sexual Activity: Not on file   Other Topics Concern  . Not on file   Social History Narrative  . No narrative on file     PHYSICAL EXAM  Filed Vitals:   05/10/13 1502  BP: 117/69  Pulse: 69  Height: 5\' 8"  (1.727 m)  Weight: 175 lb (79.379 kg)   Body mass index is 26.61 kg/(m^2).  Generalized: Well developed, in no acute distress  Head: normocephalic and atraumatic. Oropharynx benign  Neck: Supple, no carotid bruits  Cardiac: Regular rate rhythm, no murmur  Musculoskeletal: No deformity   Neurological examination  Mentation: Alert oriented to time, place, history taking. Follows all commands speech and language fluent Cranial nerve II-XII: Pupils were equal round reactive to light extraocular movements were full, Visual fields (+) left hemianopsia. Facial sensation and strength were normal. hearing was intact to finger rubbing bilaterally. Uvula tongue midline. head turning and shoulder shrug and were normal and symmetric.Tongue protrusion into cheek strength was normal. Motor: normal bulk and tone, full strength in the BUE, BLE, fine finger movements normal, no pronator drift. No focal weakness Sensory: normal and symmetric to light touch, pinprick, and  vibration  Coordination: finger-nose-finger, heel-to-shin  bilaterally, no dysmetria Reflexes:  Deep tendon reflexes in the upper and lower extremities are present and symmetric.  Gait and Station: Rising up from seated position without assistance, moderate stride, fragmented turning, unable to perform tiptoe, and heel walking without difficulty. Using rolling walker.  DIAGNOSTIC DATA (LABS, IMAGING, TESTING) - I reviewed patient records, labs, notes, testing and imaging myself where available.  Lab Results  Component Value Date   WBC 14.7* 03/26/2013   HGB 10.2* 03/26/2013   HCT 30.1* 03/26/2013   MCV 91.2 03/26/2013   PLT 334 03/26/2013      Component Value Date/Time   NA 132* 03/26/2013 0855   K 4.5 03/26/2013 0855   CL 96 03/26/2013 0855   CO2 26 03/26/2013 0855   GLUCOSE 114* 03/26/2013 0855   BUN 29* 03/26/2013 0855   CREATININE 0.98 03/26/2013 0855   CALCIUM 8.4 03/26/2013 0855   PROT 6.6 03/26/2013 0855   ALBUMIN 1.9* 03/26/2013 0855   AST 43* 03/26/2013 0855   ALT 19 03/26/2013 0855   ALKPHOS 148* 03/26/2013 0855   BILITOT 0.6 03/26/2013 0855   GFRNONAA  79* 03/26/2013 0855   GFRAA >90 03/26/2013 0855   No results found for this basename: CHOL,  HDL,  LDLCALC,  LDLDIRECT,  TRIG,  CHOLHDL   No results found for this basename: HGBA1C   No results found for this basename: SWHQPRFF63   Lab Results  Component Value Date   TSH 7.064* 03/01/2013   Lab Results  Component Value Date   ESRSEDRATE 27* 03/03/2013    ASSESSMENT AND PLAN Mr. Ines Warf is a 78 y.o. male who presented with AMS, nausea/vomiting to [facility] on  03/05/13.  MRI on 11/22 showed large subacute hematoma right temporoparietal lobe. There are small areas of acute infarct in the right occipital parietal lobe and left temporoparietal lobe, question emboli per radiologist. Stroke team was consulted. Dr. Leonie Man reviewed the image and thinks that small areas of right occipital and parietal lobe change are likely diffusion abnormalities from subacute blood products due  to previous hemorrhage and less likely due to new stroke.  Patient with resultant left hemianopsia.   PLAN: Continue Depakote ER 500 mg oral once a day for seizure prevention. Start back on an Aspirin 81mg  daily.   Maintain strict control of hypertension with blood pressure goal below 130/90, diabetes with hemoglobin A1c goal below 7% and lipids with LDL cholesterol goal below 100 mg/dL.  Followup in the future with me in 3-4 months.   It is recommended that the patient first drives with another licensed driver in an empty parking lot. If the patient does well with this, and they can drive on a quiet street with the licensed driver. If the patient does well with this they can drive on a busy street with a licensed driver. If the patient does well with this, the next time out they can go by himself. For the first month after resuming driving, I recommend no nighttime or Interstate driving.   Meds ordered this encounter  Medications  . divalproex (DEPAKOTE ER) 500 MG 24 hr tablet    Sig: Take 1 tablet (500 mg total) by mouth daily.    Dispense:  90 tablet    Refill:  5    Order Specific Question:  Supervising Provider    Answer:  Garvin Fila [[id number]]   Return in about 4 months (around 09/07/2013).  Philmore Pali, MSN, NP-C 05/10/2013, 4:03 PM [facility] [address] [place], [place] [address] [phone]  Note: This document was prepared with digital dictation and possible smart phrase technology. Any transcriptional errors that result from this process are unintentional.

## 2013-05-18 ENCOUNTER — Ambulatory Visit
Admission: RE | Admit: 2013-05-18 | Discharge: 2013-05-18 | Disposition: A | Discharge status: Home / Self Care | Payer: Commercial Managed Care - HMO | Source: Ambulatory Visit | Attending: Physician Assistant | Admitting: Physician Assistant

## 2013-05-18 DIAGNOSIS — Z95828 Presence of other vascular implants and grafts: Secondary | ICD-10-CM

## 2013-05-18 DIAGNOSIS — I2699 Other pulmonary embolism without acute cor pulmonale: Secondary | ICD-10-CM

## 2013-06-20 ENCOUNTER — Other Ambulatory Visit: Payer: Self-pay | Admitting: Physical Medicine and Rehabilitation

## 2013-06-30 ENCOUNTER — Encounter: Payer: Medicare PPO | Admitting: Physical Medicine and Rehabilitation

## 2013-07-07 ENCOUNTER — Encounter: Payer: Self-pay | Admitting: Physical Medicine and Rehabilitation

## 2013-07-07 ENCOUNTER — Encounter: Payer: Medicare PPO | Attending: Physical Medicine and Rehabilitation | Admitting: Physical Medicine and Rehabilitation

## 2013-07-07 VITALS — BP 108/60 | HR 64 | Resp 14 | Ht 68.0 in | Wt 180.0 lb

## 2013-07-07 DIAGNOSIS — R3911 Hesitancy of micturition: Secondary | ICD-10-CM

## 2013-07-07 DIAGNOSIS — M25559 Pain in unspecified hip: Secondary | ICD-10-CM | POA: Insufficient documentation

## 2013-07-07 DIAGNOSIS — R519 Headache, unspecified: Secondary | ICD-10-CM

## 2013-07-07 DIAGNOSIS — I619 Nontraumatic intracerebral hemorrhage, unspecified: Secondary | ICD-10-CM | POA: Insufficient documentation

## 2013-07-07 DIAGNOSIS — I2510 Atherosclerotic heart disease of native coronary artery without angina pectoris: Secondary | ICD-10-CM | POA: Insufficient documentation

## 2013-07-07 DIAGNOSIS — R51 Headache: Secondary | ICD-10-CM | POA: Insufficient documentation

## 2013-07-07 MED ORDER — TAMSULOSIN HCL 0.4 MG PO CAPS: 0.4000 mg | ORAL_CAPSULE | Freq: Every day | ORAL | Status: AC

## 2013-07-07 NOTE — Patient Instructions (Addendum)
[facility]--[address]. Geanie Logan, [place] If headaches continue contact Dr. Leonie Man for input Follow up with orthopedist--for hip injection.

## 2013-07-07 NOTE — Progress Notes (Signed)
Subjective:    Patient ID: Walter Larsen, male    DOB: Mar 02, 1936, 78 y.o.   MRN: [id number]  HPI Walter Larsen is a 78 y.o. right-handed male with history of hypertension, CAD with PTCA, Hemorrhagic stroke treated at [facility] with d/c to home 02/27/13. He was admitted to [facility] 03/01/2013 with altered mental status, nausea/vomiting, hallucinations and fever.  MRI Brain 03/05/2013 showed a large subacute hematoma right temporoparietal lobe as well small areas of acute infarct in the right occipital parietal lobe and left temporal parietal lobe and conservative treatment recommended by Dr. Ellene Route . CT of the chest 03/08/2013 showed bilateral lobar and segmental pulmonary emboli and BLE dopplers showed DVTs involving the right common femoral vein and right peroneal vein as well as deep vein thrombosis left peroneal vein. Greenfield filter placed 03/08/2012  and patient has elected to keep filter in.  He was admitted to rehab for inpatient program 03/15/13-04/06/13. He has completed outpatient therapy and is independent for ADLs as well as mobility. Wife continues to handle finances as well as medication management due to problems with memory.  He uses a cane for balance due to right hip pain which is a limiting factor. He also has issues with right 5 th trigger finger and plans on following up with ortho for hip as well as his fingers. He has followed up with neurology and was advised to resume ASA 81 mg/daily for stroke prophylaxis.  He is to continue on depakote for a year for seizure prophylaxis.  He reports headaches especially in the evenings for the past few days (weeks?)--no visual changes, no changes in memory, balance or weakness. He has  history of migraines in his 78s.  He had questions about resuming driving and asked appropriate questions about timing and recovery from stroke.     Pain Inventory Average Pain 7 Pain Right Now 7 My pain is aching  In the last 24 hours, has pain interfered with the  following? General activity 7 Relation with others 0 Enjoyment of life 7 What TIME of day is your pain at its worst? evening Sleep (in general) Fair  Pain is worse with: walking Pain improves with: rest Relief from Meds: 3  Mobility walk with assistance use a cane how many minutes can you walk? 3 ability to climb steps?  yes do you drive?  no Do you have any goals in this area?  yes  Function not employed: date last employed 02/23/13 retired I need assistance with the following:  meal prep, household duties and shopping Do you have any goals in this area?  yes  Neuro/Psych confusion  Prior Studies Any changes since last visit?  no  Physicians involved in your care Any changes since last visit?  no   Family History  Problem Relation Age of Onset  . Hypertension Mother   . Hypertension Father    History   Social History  . Marital Status: Single    Spouse Name: N/A    Number of Children: 2  . Years of Education: 12th   Occupational History  . retired    Social History Main Topics  . Smoking status: Never Smoker   . Smokeless tobacco: None  . Alcohol Use: No  . Drug Use: No  . Sexual Activity: None   Other Topics Concern  . None   Social History Narrative  . None   Past Surgical History  Procedure Laterality Date  . Tee without cardioversion N/A 03/07/2013    Procedure:  TRANSESOPHAGEAL ECHOCARDIOGRAM (TEE);  Surgeon: Fay Records, MD;  Location: [facility] ENDOSCOPY;  Service: Cardiovascular;  Laterality: N/A;   Past Medical History  Diagnosis Date  . Stroke   . Coronary artery disease     3 stents   BP 108/60  Pulse 64  Resp 14  Ht 5\' 8"  (1.727 m)  Wt 180 lb (81.647 kg)  BMI 27.38 kg/m2  SpO2 99%  Opioid Risk Score:   Fall Risk Score: Moderate Fall Risk (6-13 points) (pt educated and given brochure of fall risk previously)   Review of Systems  HENT: Negative for hearing loss.   Respiratory: Negative for shortness of breath.     Cardiovascular: Negative for chest pain.  Gastrointestinal: Negative for constipation.  Genitourinary: Positive for difficulty urinating.  Musculoskeletal: Positive for arthralgias (right hip).  Neurological: Positive for headaches.  Psychiatric/Behavioral: Negative for sleep disturbance.       Depression  All other systems reviewed and are negative.       Objective:   Physical Exam  Nursing note and vitals reviewed. Constitutional: He is oriented to person, place, and time. He appears well-developed and well-nourished.  HENT:  Head: Normocephalic and atraumatic.  Eyes: Conjunctivae are normal. Pupils are equal, round, and reactive to light.  Neck: Normal range of motion. Neck supple.  Cardiovascular: Normal rate and regular rhythm.   Pulmonary/Chest: Effort normal and breath sounds normal. No respiratory distress. He has no wheezes.  Abdominal: Soft. Bowel sounds are normal. He exhibits no distension. There is no tenderness.  Musculoskeletal: He exhibits no edema and no tenderness.  Neurological: He is alert and oriented to person, place, and time.  Right hip limited due to pain--otherwise his  motor strength: 5/5 in the right deltoid, bicep, tricep, grip,  knee extensors, ankle dorsiflexor plantar flexor  Motor strength is  4+/5 in left deltoid, bicep, tricep, grip and  4/5 in left hip flexor, knee extensors, ankle dorsi flexion plantar flexor. Sensation intact to light touch and pain  Cerebellar shows minimal decrease with finger to nose on the left. Did not have problems with rapid alternating movements. Unable to perform tandem gait. Right antalgic gait due to hip pain.  Visual fields show left field cut on confrontational testing in lateral field. Hearing intact.   Skin: Skin is warm and dry.          Assessment & Plan:  1. R temporal/occipital IPH as well as acute infarcts in R occipital/parietal and L temporal/ parietal region: Mood and affect are greatly improved with  improved awareness and insight. He continue to have left visual field deficit and was given information to follow up with Dr. Frederico Hamman for exam. May start practice driving in empty parking lot with family for supervision due to left field deficits.   2. Headaches:  May be recurrent migraines or related to allergies/weather. Advised him to try tylenol for pain relief and if symptoms do not improve over the next few days to contact neurology for input. Discussed stroke symptoms and need to get assistance if neurologic changes occur.   3. Hesitancy: He continue to have hesitancy with weak stream and is out of flomax again--Refilled Flomax 0.4mg  # 30 pills--use daily in am and to discuss further refills per PMD on follow up in next few weeks.  4. Adjustment reaction:  Patient with improved awareness and admits to having a difficult time adjusting to all the changes due to current situation---easy fatigability, inability to drive, need to be dependent on  others as well as inability to work. He  does not want to start any medication at this time. Wife and patient advised to revisit this with PMD on follow up which is for April.

## 2013-09-07 ENCOUNTER — Encounter (INDEPENDENT_AMBULATORY_CARE_PROVIDER_SITE_OTHER): Payer: Self-pay

## 2013-09-07 ENCOUNTER — Encounter: Payer: Self-pay | Admitting: Nurse Practitioner

## 2013-09-07 ENCOUNTER — Ambulatory Visit (INDEPENDENT_AMBULATORY_CARE_PROVIDER_SITE_OTHER): Payer: Medicare HMO | Admitting: Nurse Practitioner

## 2013-09-07 VITALS — BP 113/67 | HR 67 | Wt 180.0 lb

## 2013-09-07 DIAGNOSIS — I69919 Unspecified symptoms and signs involving cognitive functions following unspecified cerebrovascular disease: Secondary | ICD-10-CM

## 2013-09-07 DIAGNOSIS — H53469 Homonymous bilateral field defects, unspecified side: Secondary | ICD-10-CM

## 2013-09-07 DIAGNOSIS — R519 Headache, unspecified: Secondary | ICD-10-CM

## 2013-09-07 DIAGNOSIS — I619 Nontraumatic intracerebral hemorrhage, unspecified: Secondary | ICD-10-CM

## 2013-09-07 DIAGNOSIS — R51 Headache: Secondary | ICD-10-CM

## 2013-09-07 DIAGNOSIS — H53462 Homonymous bilateral field defects, left side: Secondary | ICD-10-CM

## 2013-09-07 DIAGNOSIS — IMO0002 Reserved for concepts with insufficient information to code with codable children: Secondary | ICD-10-CM

## 2013-09-07 DIAGNOSIS — R2681 Unsteadiness on feet: Secondary | ICD-10-CM

## 2013-09-07 DIAGNOSIS — R269 Unspecified abnormalities of gait and mobility: Secondary | ICD-10-CM

## 2013-09-07 NOTE — Progress Notes (Signed)
PATIENT: Walter Larsen DOB: Aug 30, 1935  REASON FOR VISIT: ICH routine follow up HISTORY FROM: patient  HISTORY OF PRESENT ILLNESS: Walter Larsen is a 78 y.o. male with Past medical history of hypertension, dyslipidemia, recent admission to [facility] with hemorrhagic stroke. The patient is admitted for altered mental status, hallucinations and confabulation. Patient also had some frontal headache, some nausea and an episode of urinary incontinence. Cranial CT scan showed right temporal occipital parenchymal hematoma measuring up to 7 cm in light of trace intraventricular extension without hydrocephalus. He was treated with broad spectrum antiboitics for fever with leucocytosis. MRI Brain 03/05/2013 showed a large subacute hematoma right temporoparietal lobe as well as small areas of subacute hematoma in the right occipital parietal lobe and left temporal parietal lobe. Neurosurgery Dr. Ellene Route consulted advise conservative care in reference of right parietal ICH. TEE completed showing no thrombus without PFO no vegetation. CT of the chest 03/08/2013 showed bilateral lobar and segmental pulmonary emboli and BLE dopplers showed DVTs involving the right common femoral vein and right peroneal vein as well as deep vein thrombosis left peroneal vein. Greenfield filter placed 03/08/2012 by IVR and Depakote was added for headache as well as seizure prevention. He was admitted to rehab for inpatient program 03/15/13-04/06/13. Placed on seroquel to help with night time restlessness as well as insomnia. On ritalin for attention and tolerating this without difficulty. He was discharged to home on 04/06/14 at [facility] assist level for ambulationHe has completed HHPT/OT and is to be evaluated for outpatient therapies tomorrow. Is ambulating at home with use of wife's walker.  BP in office today is 117/69. He states he is doing well since hospital discharge. He had home health PT,OT, and ST for 2-3 weeks after  discharge. Left homonymous hemianopsia remains. His cognitive functioning is subjectively slower; improving.   UPDATE 09/07/13 (LL):  Since last visit, patient has had slow steady recovery.  He has continued to have headaches since the stroke; they are frontotemporal, with dull aching.  He is taking Tylenol as needed for the headaches.   The headaches are slowly improving.  Blood pressure is well controlled, it is 113/67 in the office today.  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  His son had to bring him home early several days and he is upset that he cannot do more.  He feels like his legs are going to give out on him, and he must rest often.  He feels like his peripheral vision has improved, but his wife tells me that he still cannot see in the left peripheral field, often bumping into things.   REVIEW OF SYSTEMS: Full 14 system review of systems performed and notable only for:  Constitutional: fatigue  Ear/Nose/Throat: ringing in ears, hearing loss, runny nose Neurological: memory loss, headache Sleep: Insomnia Psychiatric: confusion, agitation    ALLERGIES: Allergies  Allergen Reactions  . Ativan [Lorazepam]     confusion    HOME MEDICATIONS: Outpatient Prescriptions Prior to Visit  Medication Sig Dispense Refill  . divalproex (DEPAKOTE ER) 500 MG 24 hr tablet Take 1 tablet (500 mg total) by mouth daily.  90 tablet  5  . esomeprazole (NEXIUM) 40 MG capsule Take 40 mg by mouth daily before breakfast.      . fenofibrate (TRICOR) 145 MG tablet Take 145 mg by mouth at bedtime.      Marland Kitchen  Glucosamine-Chondroitin (OSTEO BI-FLEX REGULAR STRENGTH PO) Take 1 tablet by mouth daily with breakfast.      . metoprolol (LOPRESSOR) 50 MG tablet Take 1 tablet (50 mg total) by mouth 2 (two) times daily.  60 tablet  1  . Multiple Vitamin (MULTIVITAMIN WITH MINERALS) TABS tablet Take 1 tablet by  mouth daily with breakfast.      . Omega-3 Fatty Acids (FISH OIL PO) Take 1 capsule by mouth daily with breakfast.      . tamsulosin (FLOMAX) 0.4 MG CAPS capsule Take 1 capsule (0.4 mg total) by mouth daily after breakfast.  30 capsule  0  . atorvastatin (LIPITOR) 20 MG tablet Take 1 tablet (20 mg total) by mouth at bedtime.  30 tablet  1  . furosemide (LASIX) 20 MG tablet Take 1 tablet (20 mg total) by mouth daily.  30 tablet  1  . methylphenidate (RITALIN) 5 MG tablet Take 1 tablet (5 mg total) by mouth 2 (two) times daily with breakfast and lunch.  60 tablet  0   No facility-administered medications prior to visit.    PHYSICAL EXAM  Filed Vitals:   09/07/13 1501 09/07/13 1518  BP: 113/67 113/67  Pulse: 67 67  Weight: 180 lb (81.647 kg)    Body mass index is 27.38 kg/(m^2). No exam data present  Generalized: Well developed, in no acute distress  Head: normocephalic and atraumatic. Oropharynx benign  Neck: Supple, no carotid bruits  Cardiac: Regular rate rhythm, no murmur  Musculoskeletal: mild kyphosis  Neurological examination  Mentation: Alert to place, history taking. Follows all commands speech and language fluent. MMSE 18/30, Disoriented to time, difficulties with attention and calculation, delayed recall 1/3, unable to copy geometric figure. AFT 13. GDS 4. Cranial nerve II-XII: Pupils were equal round reactive to light extraocular movements were full, Visual fields (+) left hemianopsia. Facial sensation and strength were normal. hearing was intact to finger rubbing bilaterally. Uvula tongue midline. head turning and shoulder shrug and were normal and symmetric.Tongue protrusion into cheek strength was normal.  Motor: normal bulk and tone, full strength in the BUE, BLE, fine finger movements normal, no pronator drift. No focal weakness  Sensory: normal and symmetric to light touch, pinprick, and vibration  Coordination: finger-nose-finger, heel-to-shin bilaterally, no dysmetria   Reflexes: Deep tendon reflexes in the upper and lower extremities are present and symmetric.  Gait and Station: Rising up from seated position with mild difficulty, moderate stride, fragmented turning, unable to perform tiptoe, and heel walking without difficulty. Using cane for stability.  ASSESSMENT AND PLAN [name] is a 78 y.o. male who presented with AMS, nausea/vomiting to [facility] on 03/05/13. MRI on 11/22 showed large subacute hematoma right temporoparietal lobe. There are small areas of subacute blood products in the right occipital parietal lobe and left temporoparietal lobe.  Patient with resultant left hemianopsia, headaches and cognitive difficulties.  PLAN:  Continue Depakote ER 500 mg oral once a day for seizure prevention.  Continue Aspirin 81mg  daily.  Maintain strict control of hypertension with blood pressure goal below 130/90, diabetes with hemoglobin A1c goal below 7% and lipids with LDL cholesterol goal below 100 mg/dL.  Followup in the future with Dr. Leonie Man in 6 months.   Return in about 6 months (around 03/10/2014).  Walter Pali, MSN, NP-C 09/07/2013, 4:27 PM [facility] [address], [place], [place] [address] [phone]  Note: This document was prepared with digital dictation and possible smart phrase technology. Any transcriptional  errors that result from this process are unintentional. ,

## 2013-09-07 NOTE — Patient Instructions (Addendum)
Continue Depakote ER 500 mg oral once a day for seizure prevention.  You may take Tylenol Extra Strength for headaches, as needed. Continue Aspirin 81mg  daily.  Maintain strict control of hypertension with blood pressure goal below 130/90, diabetes with hemoglobin A1c goal below 7% and lipids with LDL cholesterol goal below 100 mg/dL.  Followup in the future with Dr. Leonie Man in 6 months.

## 2013-09-26 NOTE — Progress Notes (Signed)
I agree with above 

## 2013-09-28 ENCOUNTER — Telehealth: Payer: Self-pay | Admitting: *Deleted

## 2013-09-28 NOTE — Telephone Encounter (Signed)
Not going to be able to keep the 10/03/13 appt with Kirsteins.  Please give them a call to reschedule

## 2013-10-03 ENCOUNTER — Ambulatory Visit: Payer: Medicare PPO | Admitting: Physical Medicine & Rehabilitation

## 2013-10-05 ENCOUNTER — Encounter: Payer: Self-pay | Admitting: Neurology

## 2013-10-05 ENCOUNTER — Telehealth: Payer: Self-pay | Admitting: Neurology

## 2013-10-05 NOTE — Telephone Encounter (Signed)
Pt's PCP, Dr. Burnett Sheng called wanting to speak with Dr. Leonie Man to see if the doctor thought that pt needed to be seen sooner than 03/14/14 with Dr. Leonie Man, because pt has a change in behavior. I informed Dr. Burnett Sheng, that Dr. Leonie Man was out of the office and asked if she would like to speak with the [facility], because Dr. Leonie Man will not be back until 10/18/13. Dr. Burnett Sheng stated that she would rather speak with Dr. Leonie Man and that she would wait until Dr. Leonie Man comes back. Dr. Florina Ou office number is [phone] and would like Dr. Leonie Man to give her a call back concerning this matter. Please advise

## 2013-10-22 NOTE — Telephone Encounter (Signed)
Schedule patient sooner to see ,e

## 2013-10-24 NOTE — Telephone Encounter (Signed)
Called pt and spoke with pt's wife [name] to schedule an appt for the pt on 12/16/13 with  Dr. Leonie Man, per Dr. Leonie Man. I advised that wife that if the pt has any other problems, questions or concerns to call the office. Wife verbalized understanding.

## 2013-11-07 ENCOUNTER — Encounter: Payer: Medicare PPO | Attending: Physical Medicine & Rehabilitation

## 2013-11-07 ENCOUNTER — Encounter: Payer: Self-pay | Admitting: Physical Medicine & Rehabilitation

## 2013-11-07 ENCOUNTER — Ambulatory Visit (HOSPITAL_BASED_OUTPATIENT_CLINIC_OR_DEPARTMENT_OTHER): Payer: Commercial Managed Care - HMO | Admitting: Physical Medicine & Rehabilitation

## 2013-11-07 VITALS — BP 100/57 | HR 62 | Resp 14 | Ht 68.0 in | Wt 180.0 lb

## 2013-11-07 DIAGNOSIS — H53469 Homonymous bilateral field defects, unspecified side: Secondary | ICD-10-CM | POA: Insufficient documentation

## 2013-11-07 DIAGNOSIS — I2510 Atherosclerotic heart disease of native coronary artery without angina pectoris: Secondary | ICD-10-CM | POA: Insufficient documentation

## 2013-11-07 DIAGNOSIS — Z86711 Personal history of pulmonary embolism: Secondary | ICD-10-CM | POA: Insufficient documentation

## 2013-11-07 DIAGNOSIS — R51 Headache: Secondary | ICD-10-CM | POA: Insufficient documentation

## 2013-11-07 DIAGNOSIS — I10 Essential (primary) hypertension: Secondary | ICD-10-CM | POA: Insufficient documentation

## 2013-11-07 DIAGNOSIS — I611 Nontraumatic intracerebral hemorrhage in hemisphere, cortical: Secondary | ICD-10-CM

## 2013-11-07 DIAGNOSIS — Z86718 Personal history of other venous thrombosis and embolism: Secondary | ICD-10-CM | POA: Diagnosis not present

## 2013-11-07 DIAGNOSIS — I69919 Unspecified symptoms and signs involving cognitive functions following unspecified cerebrovascular disease: Secondary | ICD-10-CM

## 2013-11-07 DIAGNOSIS — H53462 Homonymous bilateral field defects, left side: Secondary | ICD-10-CM

## 2013-11-07 DIAGNOSIS — I6990 Unspecified sequelae of unspecified cerebrovascular disease: Secondary | ICD-10-CM | POA: Insufficient documentation

## 2013-11-07 DIAGNOSIS — I619 Nontraumatic intracerebral hemorrhage, unspecified: Secondary | ICD-10-CM

## 2013-11-07 NOTE — Patient Instructions (Signed)
COntinue Home exercise program  Follow up with neurology

## 2013-11-07 NOTE — Progress Notes (Signed)
   Subjective:    Patient ID: Walter Larsen, male    DOB: 28-Aug-1935, 78 y.o.   MRN: [id number]  HPI  Walter Larsen is a 78 y.o. right-handed male with history of hypertension, CAD with PTCA, Hemorrhagic stroke treated at [facility] with d/c to home 02/27/13. He was admitted to [facility] 03/01/2013 with altered mental status, nausea/vomiting, hallucinations and fever.  MRI Brain 03/05/2013 showed a large subacute hematoma right temporoparietal lobe as well small areas of acute infarct in the right occipital parietal lobe and left temporal parietal lobe and conservative treatment recommended by Dr. Ellene Route . CT of the chest 03/08/2013 showed bilateral lobar and segmental pulmonary emboli and BLE dopplers showed DVTs involving the right common femoral vein and right peroneal vein as well as deep vein thrombosis left peroneal vein. Greenfield filter placed 03/08/2012  and patient has elected to keep filter in.  He was admitted to rehab for inpatient program 03/15/13-04/06/13. He has completed outpatient therapy and is independent for ADLs as well as mobility. Wife continues to handle finances as well as medication management due to problems with memory.  Trigger finger release bilateral hands Performed about 1 wk ago Wife found him in yard on ground pt didn't remember how he got there  Evaluated by PCP, CT head and bloodwork negative No falls but did slide off a chair  Review of Systems Poor energy level R shoulder pain    Objective:   Physical Exam  Nursing note and vitals reviewed. Constitutional: He is oriented to person, place, and time. He appears well-developed and well-nourished.  HENT:  Head: Normocephalic and atraumatic.  Eyes: Conjunctivae and EOM are normal. Pupils are equal, round, and reactive to light.  Neurological: He is alert and oriented to person, place, and time. He has normal strength. No sensory deficit.  Psychiatric: He has a normal mood and affect.   Grip NT secondary to recent hand  surgery  5/5 BLE FROM B shoulders without pain  Gait without evidence of toe drag or knee instability Wide base of support         Assessment & Plan:  1. R temporal/occipital IPH as well as acute infarcts in R occipital/parietal and L temporal/ parietal region: Mood and affect are greatly improved with improved awareness and insight.  2. Headaches: aimproved, no futher migraines since last visit  3. Hesitancy:PCP is rxing flomax

## 2013-11-07 NOTE — Progress Notes (Signed)
   Subjective:    Patient ID: Walter Larsen, male    DOB: 07-12-1935, 78 y.o.   MRN: [id number]  HPI  Pain Inventory Average Pain 0 Pain Right Now 0 My pain is n/a  In the last 24 hours, has pain interfered with the following? General activity 0 Relation with others 0 Enjoyment of life 0 What TIME of day is your pain at its worst? n/a Sleep (in general) Fair  Pain is worse with: n/a Pain improves with: n/a Relief from Meds: 7  Mobility use a cane how many minutes can you walk? 5 ability to climb steps?  yes do you drive?  no  Function not employed: date last employed 02/2013  Neuro/Psych weakness confusion  Prior Studies Any changes since last visit?  no  Physicians involved in your care Any changes since last visit?  no   Family History  Problem Relation Age of Onset  . Hypertension Mother   . Hypertension Father    History   Social History  . Marital Status: Single    Spouse Name: N/A    Number of Children: 2  . Years of Education: 12th   Occupational History  . retired    Social History Main Topics  . Smoking status: Never Smoker   . Smokeless tobacco: None  . Alcohol Use: No  . Drug Use: No  . Sexual Activity: None   Other Topics Concern  . None   Social History Narrative  . None   Past Surgical History  Procedure Laterality Date  . Tee without cardioversion N/A 03/07/2013    Procedure: TRANSESOPHAGEAL ECHOCARDIOGRAM (TEE);  Surgeon: Fay Records, MD;  Location: [facility] ENDOSCOPY;  Service: Cardiovascular;  Laterality: N/A;   Past Medical History  Diagnosis Date  . Stroke   . Coronary artery disease     3 stents   BP 100/57  Pulse 62  Resp 14  Ht 5\' 8"  (1.727 m)  Wt 180 lb (81.647 kg)  BMI 27.38 kg/m2  SpO2 94%  Opioid Risk Score:   Fall Risk Score: High Fall Risk (>13 points) (patient educated handout declined)   Review of Systems  Neurological: Positive for weakness.  Psychiatric/Behavioral: Positive for confusion.  All  other systems reviewed and are negative.      Objective:   Physical Exam        Assessment & Plan:

## 2013-12-16 ENCOUNTER — Ambulatory Visit (INDEPENDENT_AMBULATORY_CARE_PROVIDER_SITE_OTHER): Payer: Medicare HMO | Admitting: Neurology

## 2013-12-16 ENCOUNTER — Encounter: Payer: Self-pay | Admitting: Neurology

## 2013-12-16 VITALS — BP 108/58 | HR 55 | Ht 68.0 in | Wt 177.0 lb

## 2013-12-16 DIAGNOSIS — H53469 Homonymous bilateral field defects, unspecified side: Secondary | ICD-10-CM

## 2013-12-16 DIAGNOSIS — H53462 Homonymous bilateral field defects, left side: Secondary | ICD-10-CM

## 2013-12-16 NOTE — Progress Notes (Signed)
PATIENT: Walter Larsen DOB: 1935-05-05  REASON FOR VISIT: ICH routine follow up HISTORY FROM: patient  HISTORY OF PRESENT ILLNESS: Walter Larsen is a 78 y.o. male with Past medical history of hypertension, dyslipidemia, recent admission to [facility] with hemorrhagic stroke. The patient is admitted for altered mental status, hallucinations and confabulation. Patient also had some frontal headache, some nausea and an episode of urinary incontinence. Cranial CT scan showed right temporal occipital parenchymal hematoma measuring up to 7 cm in light of trace intraventricular extension without hydrocephalus. He was treated with broad spectrum antiboitics for fever with leucocytosis. MRI Brain 03/05/2013 showed a large subacute hematoma right temporoparietal lobe as well as small areas of subacute hematoma in the right occipital parietal lobe and left temporal parietal lobe. Neurosurgery Dr. Ellene Route consulted advise conservative care in reference of right parietal ICH. TEE completed showing no thrombus without PFO no vegetation. CT of the chest 03/08/2013 showed bilateral lobar and segmental pulmonary emboli and BLE dopplers showed DVTs involving the right common femoral vein and right peroneal vein as well as deep vein thrombosis left peroneal vein. Greenfield filter placed 03/08/2012 by IVR and Depakote was added for headache as well as seizure prevention. He was admitted to rehab for inpatient program 03/15/13-04/06/13. Placed on seroquel to help with night time restlessness as well as insomnia. On ritalin for attention and tolerating this without difficulty. He was discharged to home on 04/06/14 at [facility] assist level for ambulationHe has completed HHPT/OT and is to be evaluated for outpatient therapies tomorrow. Is ambulating at home with use of wife's walker.  BP in office today is 117/69. He states he is doing well since hospital discharge. He had home health PT,OT, and ST for 2-3 weeks after  discharge. Left homonymous hemianopsia remains. His cognitive functioning is subjectively slower; improving.   UPDATE 09/07/13 (LL):  Since last visit, patient has had slow steady recovery.  He has continued to have headaches since the stroke; they are frontotemporal, with dull aching.  He is taking Tylenol as needed for the headaches.   The headaches are slowly improving.  Blood pressure is well controlled, it is 113/67 in the office today.  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  His son had to bring him home early several days and he is upset that he cannot do more.  He feels like his legs are going to give out on him, and he must rest often.  He feels like his peripheral vision has improved, but his wife tells me that he still cannot see in the left peripheral field, often bumping into things.    UPDATE 12/16/2013 : He returns for followup after last visit 3 months ago. He continues to have persistent left-sided vision difficulties which appear unchanged. He however feels his short term memory and cognitive difficulties are slightly better. Still not driving but wants to drive. He has undergone surgery for trigger finger in the right hand. He is currently bothered by bursitis in his hip and is thinking of having injection of surgery for that as well. He states his headaches have now gone he cannot remember the last time he had a headache. He is still taking Depakote ER 500 once a day. His blood pressure is under good control. He has no new complaints today. REVIEW OF SYSTEMS: Full 14 system review  of systems performed and notable only for:  Hearing loss, ringing in the ears, cough, vision loss, back pain, difficult to urinate , memory loss and all other systems negative   ALLERGIES: Allergies  Allergen Reactions  . Ativan [Lorazepam]     confusion    HOME MEDICATIONS: Outpatient  Prescriptions Prior to Visit  Medication Sig Dispense Refill  . atorvastatin (LIPITOR) 20 MG tablet Take 80 mg by mouth at bedtime.      Marland Kitchen esomeprazole (NEXIUM) 40 MG capsule Take 40 mg by mouth daily before breakfast.      . fenofibrate (TRICOR) 145 MG tablet Take 145 mg by mouth at bedtime.      . Glucosamine-Chondroitin (OSTEO BI-FLEX REGULAR STRENGTH PO) Take 1 tablet by mouth daily with breakfast.      . HYDROcodone-acetaminophen (NORCO/VICODIN) 5-325 MG per tablet       . metoprolol (LOPRESSOR) 50 MG tablet Take 1 tablet (50 mg total) by mouth 2 (two) times daily.  60 tablet  1  . Multiple Vitamin (MULTIVITAMIN WITH MINERALS) TABS tablet Take 1 tablet by mouth daily with breakfast.      . nystatin cream (MYCOSTATIN)       . Omega-3 Fatty Acids (FISH OIL PO) Take 1 capsule by mouth daily with breakfast.      . tamsulosin (FLOMAX) 0.4 MG CAPS capsule Take 1 capsule (0.4 mg total) by mouth daily after breakfast.  30 capsule  0  . divalproex (DEPAKOTE ER) 500 MG 24 hr tablet Take 1 tablet (500 mg total) by mouth daily.  90 tablet  5   No facility-administered medications prior to visit.    PHYSICAL EXAM  Filed Vitals:   12/16/13 1002  BP: 108/58  Pulse: 55  Height: 5\' 8"  (1.727 m)  Weight: 177 lb (80.287 kg)   Body mass index is 26.92 kg/(m^2). No exam data present  Generalized: Well developed, in no acute distress  Head: normocephalic and atraumatic. Oropharynx benign  Neck: Supple, no carotid bruits  Cardiac: Regular rate rhythm, no murmur  Musculoskeletal: mild kyphosis  Neurological examination  Mentation: Alert to place, history taking. Follows all commands speech and language fluent. Recall diminished 2/3. Intact attention registration and follows three-step commands. Cranial nerve II-XII: Pupils were equal round reactive to light extraocular movements were full, Visual fields show dense left hemianopsia. Facial sensation and strength were normal. hearing was intact to  finger rubbing bilaterally. Uvula tongue midline. head turning and shoulder shrug and were normal and symmetric.Tongue protrusion into cheek strength was normal.  Motor: normal bulk and tone, full strength in the BUE, BLE, fine finger movements normal, no pronator drift. No focal weakness  Sensory: normal and symmetric to light touch, pinprick, and vibration  Coordination: finger-nose-finger, heel-to-shin bilaterally, no dysmetria  Reflexes: Deep tendon reflexes in the upper and lower extremities are present and symmetric.  Gait and Station: Rising up from seated position with mild difficulty, moderate stride, fragmented turning, unable to perform tiptoe, and heel walking without difficulty. Using cane for stability.  ASSESSMENT AND PLAN Walter Larsen is a 78 y.o. male who presented with AMS, nausea/vomiting to [facility] on 03/05/13. MRI on 11/22 showed large subacute hematoma right temporoparietal lobe. There are small areas of subacute blood products in the right occipital parietal lobe and left temporoparietal lobe.  Patient with resultant left hemianopsia. Headaches and cognitive difficulties appear improved.Marland Kitchen  PLAN:  I had a long discussion with the patient and his wife regarding his residual left-sided vision field  loss from his brain hemorrhage in November 2014. Unfortunately he will not be able to drive as his peripheral vision loss is unlikely to improve. He was counseled not to drive. Continue strict control of hypertension with blood pressure goal below 130/90. He was advised to taper and discontinue Depakote over the next 10 days since he has been headache free for several months. Return for followup in one year or earlier if necessary .   Return in about 1 year (around 12/17/2014).  Antony Contras, MD  12/16/2013, 10:24 AM [facility] [address] [place], [place] [address] [phone]  Note: This document was prepared with digital dictation and possible smart  phrase technology. Any transcriptional errors that result from this process are unintentional. ,

## 2013-12-16 NOTE — Patient Instructions (Signed)
I had a long discussion with the patient and his wife regarding his residual left-sided vision field loss from his brain hemorrhage in November 2014. Unfortunately he will not be able to drive as his peripheral vision loss is unlikely to improve. He was counseled not to drive. Continue strict control of hypertension with blood pressure goal below 130/90. He was advised to taper and discontinue Depakote over the next 10 days since he has been headache free for several months. Return for followup in one year or earlier if necessary

## 2014-03-14 ENCOUNTER — Ambulatory Visit: Payer: Medicare HMO | Admitting: Neurology

## 2014-12-25 ENCOUNTER — Encounter: Payer: Self-pay | Admitting: Neurology

## 2014-12-25 ENCOUNTER — Ambulatory Visit (INDEPENDENT_AMBULATORY_CARE_PROVIDER_SITE_OTHER): Payer: Commercial Managed Care - HMO | Admitting: Neurology

## 2014-12-25 VITALS — BP 104/60 | HR 57 | Ht 68.0 in | Wt 185.0 lb

## 2014-12-25 DIAGNOSIS — G3184 Mild cognitive impairment, so stated: Secondary | ICD-10-CM | POA: Diagnosis not present

## 2014-12-25 NOTE — Patient Instructions (Addendum)
I had a long discussion with the patient and his wife regarding his mild cognitive difficulties following his intracerebral hemorrhage in 2014, personally reviewed prior imaging studies and answered questions. Continue strict control of hypertension with blood pressure goal below 130/90. Continue conservative follow-up for his cognitive difficulties at the present time if there is further decline may consider trial of cholinesterase inhibitors later. I advised him not to drive at the current time due to persistent cognitive and vision difficulties. I have advised the wife to call me if there is further cognitive decline. Continue aspirin for stroke prevention. Return for follow-up in 6 months or call earlier if necessary.

## 2014-12-25 NOTE — Progress Notes (Signed)
PATIENT: Walter Larsen DOB: 1935/04/20  REASON FOR VISIT: ICH routine follow up HISTORY FROM: patient  HISTORY OF PRESENT ILLNESS: Walter Larsen is a 79 y.o. male with Past medical history of hypertension, dyslipidemia, recent admission to [facility] with hemorrhagic stroke. The patient is admitted for altered mental status, hallucinations and confabulation. Patient also had some frontal headache, some nausea and an episode of urinary incontinence. Cranial CT scan showed right temporal occipital parenchymal hematoma measuring up to 7 cm in light of trace intraventricular extension without hydrocephalus. He was treated with broad spectrum antiboitics for fever with leucocytosis. MRI Brain 03/05/2013 showed a large subacute hematoma right temporoparietal lobe as well as small areas of subacute hematoma in the right occipital parietal lobe and left temporal parietal lobe. Neurosurgery Dr. Ellene Route consulted advise conservative care in reference of right parietal ICH. TEE completed showing no thrombus without PFO no vegetation. CT of the chest 03/08/2013 showed bilateral lobar and segmental pulmonary emboli and BLE dopplers showed DVTs involving the right common femoral vein and right peroneal vein as well as deep vein thrombosis left peroneal vein. Greenfield filter placed 03/08/2012 by IVR and Depakote was added for headache as well as seizure prevention. He was admitted to rehab for inpatient program 03/15/13-04/06/13. Placed on seroquel to help with night time restlessness as well as insomnia. On ritalin for attention and tolerating this without difficulty. He was discharged to home on 04/06/14 at [facility] assist level for ambulationHe has completed HHPT/OT and is to be evaluated for outpatient therapies tomorrow. Is ambulating at home with use of wife's walker.  BP in office today is 117/69. He states he is doing well since hospital discharge. He had home health PT,OT, and ST for 2-3 weeks after  discharge. Left homonymous hemianopsia remains. His cognitive functioning is subjectively slower; improving.   UPDATE 09/07/13 (LL):  Since last visit, patient has had slow steady recovery.  He has continued to have headaches since the stroke; they are frontotemporal, with dull aching.  He is taking Tylenol as needed for the headaches.   The headaches are slowly improving.  Blood pressure is well controlled, it is 113/67 in the office today.  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  His son had to bring him home early several days and he is upset that he cannot do more.  He feels like his legs are going to give out on him, and he must rest often.  He feels like his peripheral vision has improved, but his wife tells me that he still cannot see in the left peripheral field, often bumping into things.    UPDATE 12/16/2013 : He returns for followup after last visit 3 months ago. He continues to have persistent left-sided vision difficulties which appear unchanged. He however feels his short term memory and cognitive difficulties are slightly better. Still not driving but wants to drive. He has undergone surgery for trigger finger in the right hand. He is currently bothered by bursitis in his hip and is thinking of having injection of surgery for that as well. He states his headaches have now gone he cannot remember the last time he had a headache. He is still taking Depakote ER 500 once a day. His blood pressure is under good control. He has no new complaints today. Update 12/25/2014 : He returns for  follow-up after last visit 1 year ago. He states is doing well and has only mild residual left-sided weakness though visual deficits persist. Patient tried going back to work but was unable to cope and primary physician asked him to quit. Still has mild memory and cognitive difficulties. He however is able to work  around the house fairly well. He states his blood pressure is well controlled and today it is 104/60. He wants to drive but clearly still has persistent cognitive and vision difficulties. REVIEW OF SYSTEMS: Full 14 system review of systems performed and notable only for: Ringing in the ears, frequent waking, joint and back pain, memory loss  and all other systems negative   ALLERGIES: Allergies  Allergen Reactions  . Ativan [Lorazepam]     confusion    HOME MEDICATIONS: Outpatient Prescriptions Prior to Visit  Medication Sig Dispense Refill  . atorvastatin (LIPITOR) 20 MG tablet Take 80 mg by mouth at bedtime.    Marland Kitchen esomeprazole (NEXIUM) 40 MG capsule Take 40 mg by mouth daily before breakfast.    . fenofibrate (TRICOR) 145 MG tablet Take 145 mg by mouth at bedtime.    . Glucosamine-Chondroitin (OSTEO BI-FLEX REGULAR STRENGTH PO) Take 1 tablet by mouth daily with breakfast.    . HYDROcodone-acetaminophen (NORCO/VICODIN) 5-325 MG per tablet     . metoprolol (LOPRESSOR) 50 MG tablet Take 1 tablet (50 mg total) by mouth 2 (two) times daily. 60 tablet 1  . Multiple Vitamin (MULTIVITAMIN WITH MINERALS) TABS tablet Take 1 tablet by mouth daily with breakfast.    . nystatin cream (MYCOSTATIN)     . Omega-3 Fatty Acids (FISH OIL PO) Take 1 capsule by mouth daily with breakfast.    . tamsulosin (FLOMAX) 0.4 MG CAPS capsule Take 1 capsule (0.4 mg total) by mouth daily after breakfast. 30 capsule 0   No facility-administered medications prior to visit.    PHYSICAL EXAM  Filed Vitals:   12/25/14 1134  BP: 104/60  Pulse: 57  Height: 5\' 8"  (1.727 m)  Weight: 185 lb (83.915 kg)   Body mass index is 28.14 kg/(m^2). No exam data present  Generalized: Mildly obese Caucasian middle-aged male in no acute distress  Head: normocephalic and atraumatic.   Neck: Supple, no carotid bruits  Cardiac: Regular rate rhythm, no murmur  Musculoskeletal: mild kyphosis  Neurological examination    Mentation: Alert to place, history taking. Follows all commands speech and language fluent. Recall diminished 2/3. Intact attention registration and follows three-step commands. Mini-Mental status exam score 20/30 with deficits in orientation, attention, calculation and recall. Animal naming test 14. Clock drawing 4/4. Able to copy intersecting pentagons. Cranial nerve II-XII: Pupils were equal round reactive to light extraocular movements were full, Visual fields show dense left hemianopsia. Facial sensation and strength were normal. hearing was intact to finger rubbing bilaterally. Uvula tongue midline. head turning and shoulder shrug and were normal and symmetric.Tongue protrusion into cheek strength was normal.  Motor: normal bulk and tone, full strength in the BUE, BLE, fine finger movements normal, no pronator drift. No focal weakness  Sensory: normal and symmetric to light touch, pinprick, and vibration  Coordination: finger-nose-finger, heel-to-shin bilaterally, no dysmetria  Reflexes: Deep tendon reflexes in the upper and lower extremities are present and symmetric.  Gait and Station: Rising up from seated position with mild difficulty, moderate stride, fragmented turning, unable to perform tiptoe, and heel walking without difficulty. Using cane for stability.  ASSESSMENT AND PLAN Mr. Vishwa Dais is a 79  y.o. male who presented with AMS, nausea/vomiting to [facility] on 03/05/13. MRI on 11/22 showed large subacute hematoma right temporoparietal lobe. There are small areas of subacute blood products in the right occipital parietal lobe and left temporoparietal lobe.  Patient with resultant left hemianopsia. Headaches and cognitive difficulties appear stable.Marland Kitchen  PLAN:  I had a long discussion with the patient and his wife regarding his mild cognitive difficulties following his intracerebral hemorrhage in 2014, personally reviewed prior imaging studies and answered questions. Continue strict control of  hypertension with blood pressure goal below 130/90. Continue conservative follow-up for his cognitive difficulties at the present time if there is further decline may consider trial of cholinesterase inhibitors later. I have advised the wife to call me if there is further cognitive decline. I advised him not to drive at the present time due to persistent vision and cognitive difficulties. Continue aspirin for stroke prevention. Return for follow-up in 6 months or call earlier if necessary.  Return in about 6 months (around 06/24/2015).  Antony Contras, MD  12/25/2014, 6:40 PM [facility] [address], [place], [place] [address] [phone]  Note: This document was prepared with digital dictation and possible smart phrase technology. Any transcriptional errors that result from this process are unintentional. ,

## 2015-01-10 DIAGNOSIS — I2510 Atherosclerotic heart disease of native coronary artery without angina pectoris: Secondary | ICD-10-CM | POA: Insufficient documentation

## 2015-01-10 DIAGNOSIS — E785 Hyperlipidemia, unspecified: Secondary | ICD-10-CM | POA: Insufficient documentation

## 2015-06-18 DIAGNOSIS — H1045 Other chronic allergic conjunctivitis: Secondary | ICD-10-CM | POA: Diagnosis not present

## 2015-06-18 DIAGNOSIS — I10 Essential (primary) hypertension: Secondary | ICD-10-CM | POA: Diagnosis not present

## 2015-06-27 ENCOUNTER — Ambulatory Visit (INDEPENDENT_AMBULATORY_CARE_PROVIDER_SITE_OTHER): Payer: PPO | Admitting: Neurology

## 2015-06-27 ENCOUNTER — Encounter: Payer: Self-pay | Admitting: Neurology

## 2015-06-27 VITALS — BP 102/60 | HR 62 | Ht 68.0 in | Wt 186.2 lb

## 2015-06-27 DIAGNOSIS — G3184 Mild cognitive impairment, so stated: Secondary | ICD-10-CM | POA: Diagnosis not present

## 2015-06-27 NOTE — Progress Notes (Signed)
PATIENT: Walter Larsen DOB: 1935/04/20  REASON FOR VISIT: ICH routine follow up HISTORY FROM: patient  HISTORY OF PRESENT ILLNESS: Walter Larsen is a 80 y.o. male with Past medical history of hypertension, dyslipidemia, recent admission to [facility] with hemorrhagic stroke. The patient is admitted for altered mental status, hallucinations and confabulation. Patient also had some frontal headache, some nausea and an episode of urinary incontinence. Cranial CT scan showed right temporal occipital parenchymal hematoma measuring up to 7 cm in light of trace intraventricular extension without hydrocephalus. He was treated with broad spectrum antiboitics for fever with leucocytosis. MRI Brain 03/05/2013 showed a large subacute hematoma right temporoparietal lobe as well as small areas of subacute hematoma in the right occipital parietal lobe and left temporal parietal lobe. Neurosurgery Dr. Ellene Route consulted advise conservative care in reference of right parietal ICH. TEE completed showing no thrombus without PFO no vegetation. CT of the chest 03/08/2013 showed bilateral lobar and segmental pulmonary emboli and BLE dopplers showed DVTs involving the right common femoral vein and right peroneal vein as well as deep vein thrombosis left peroneal vein. Greenfield filter placed 03/08/2012 by IVR and Depakote was added for headache as well as seizure prevention. He was admitted to rehab for inpatient program 03/15/13-04/06/13. Placed on seroquel to help with night time restlessness as well as insomnia. On ritalin for attention and tolerating this without difficulty. He was discharged to home on 04/06/14 at [facility] assist level for ambulationHe has completed HHPT/OT and is to be evaluated for outpatient therapies tomorrow. Is ambulating at home with use of wife's walker.  BP in office today is 117/69. He states he is doing well since hospital discharge. He had home health PT,OT, and ST for 2-3 weeks after  discharge. Left homonymous hemianopsia remains. His cognitive functioning is subjectively slower; improving.   UPDATE 09/07/13 (LL):  Since last visit, patient has had slow steady recovery.  He has continued to have headaches since the stroke; they are frontotemporal, with dull aching.  He is taking Tylenol as needed for the headaches.   The headaches are slowly improving.  Blood pressure is well controlled, it is 113/67 in the office today.  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  His son had to bring him home early several days and he is upset that he cannot do more.  He feels like his legs are going to give out on him, and he must rest often.  He feels like his peripheral vision has improved, but his wife tells me that he still cannot see in the left peripheral field, often bumping into things.    UPDATE 12/16/2013 : He returns for followup after last visit 3 months ago. He continues to have persistent left-sided vision difficulties which appear unchanged. He however feels his short term memory and cognitive difficulties are slightly better. Still not driving but wants to drive. He has undergone surgery for trigger finger in the right hand. He is currently bothered by bursitis in his hip and is thinking of having injection of surgery for that as well. He states his headaches have now gone he cannot remember the last time he had a headache. He is still taking Depakote ER 500 once a day. His blood pressure is under good control. He has no new complaints today. Update 12/25/2014 : He returns for  follow-up after last visit 1 year ago. He states is doing well and has only mild residual left-sided weakness though visual deficits persist. Patient tried going back to work but was unable to cope and primary physician asked him to quit. Still has mild memory and cognitive difficulties. He however is able to work  around the house fairly well. He states his blood pressure is well controlled and today it is 104/60. He wants to drive but clearly still has persistent cognitive and vision difficulties. Update 06/27/2015 ; he returns for follow-up after last visit 6 months ago. Is accompanied by his wife. He continues to have mild short-term memory difficulties which are unchanged. He still has some persistent left-sided vision loss which is also unchanged. He has not been driving a lot and limits his driving only to his property. He remains on aspirin which is tolerating well without bleeding but occasional bruising. He states his blood pressure is well controlled and today it is 102/60. Patient does not participate in mentally challenging exercises but is otherwise physically quite active. He has no new complaints today. REVIEW OF SYSTEMS: Full 14 system review of systems performed and notable only for: Ringing in the ears, eye itching, joint pain, back pain memory loss  and all other systems negative   ALLERGIES: Allergies  Allergen Reactions  . Ativan [Lorazepam]     confusion    HOME MEDICATIONS: Outpatient Prescriptions Prior to Visit  Medication Sig Dispense Refill  . esomeprazole (NEXIUM) 40 MG capsule Take 40 mg by mouth daily before breakfast.    . fenofibrate (TRICOR) 145 MG tablet Take 145 mg by mouth at bedtime.    . Glucosamine-Chondroitin (OSTEO BI-FLEX REGULAR STRENGTH PO) Take 1 tablet by mouth daily with breakfast.    . metoprolol (LOPRESSOR) 50 MG tablet Take 1 tablet (50 mg total) by mouth 2 (two) times daily. 60 tablet 1  . Multiple Vitamin (MULTIVITAMIN WITH MINERALS) TABS tablet Take 1 tablet by mouth daily with breakfast.    . Omega-3 Fatty Acids (FISH OIL PO) Take 1 capsule by mouth daily with breakfast.    . tamsulosin (FLOMAX) 0.4 MG CAPS capsule Take 1 capsule (0.4 mg total) by mouth daily after breakfast. 30 capsule 0  . atorvastatin (LIPITOR) 20 MG tablet Take 80 mg by mouth at  bedtime. Reported on 06/27/2015    . HYDROcodone-acetaminophen (NORCO/VICODIN) 5-325 MG per tablet Reported on 06/27/2015    . nystatin cream (MYCOSTATIN) Reported on 06/27/2015     No facility-administered medications prior to visit.    PHYSICAL EXAM  Filed Vitals:   06/27/15 1119  BP: 102/60  Pulse: 62  Height: 5\' 8"  (1.727 m)  Weight: 186 lb 3.2 oz (84.46 kg)   Body mass index is 28.32 kg/(m^2). No exam data present  Generalized: Mildly obese Caucasian middle-aged male in no acute distress  Head: normocephalic and atraumatic.   Neck: Supple, no carotid bruits  Cardiac: Regular rate rhythm, no murmur  Musculoskeletal: mild kyphosis  Neurological examination  Mentation: Alert to place, history taking. Follows all commands speech and language fluent. Recall diminished 2/3. Intact attention registration and follows three-step commands. Mini-Mental status exam not doneAnimal naming test 13. Clock drawing 4/4. Able to copy intersecting pentagons. Cranial nerve II-XII: Pupils were equal round reactive to light extraocular movements were full, Visual fields show dense left hemianopsia. Facial sensation and strength were normal. hearing was intact to finger rubbing bilaterally. Uvula tongue midline. head turning and shoulder shrug and were normal and  symmetric.Tongue protrusion into cheek strength was normal.  Motor: normal bulk and tone, full strength in the BUE, BLE, fine finger movements normal, no pronator drift. No focal weakness  Sensory: normal and symmetric to light touch, pinprick, and vibration  Coordination: finger-nose-finger, heel-to-shin bilaterally, no dysmetria  Reflexes: Deep tendon reflexes in the upper and lower extremities are present and symmetric.  Gait and Station: Rising up from seated position with mild difficulty, moderate stride, fragmented turning, unable to perform tiptoe, and heel walking without difficulty. Using cane for stability.  ASSESSMENT AND PLAN Mr.  Walter Larsen is a 80 y.o. male who presented with AMS, nausea/vomiting to [facility] on 03/05/13. MRI on 11/22 showed large subacute hematoma right temporoparietal lobe. There are small areas of subacute blood products in the right occipital parietal lobe and left temporoparietal lobe.  Patient with resultant left hemianopsia. Mild cognitive difficulties appear stable.Marland Kitchen  PLAN:  I had a long d/w patient and wife about his remote hemorrhagic stroke and mild memory loss, risk for recurrent stroke/TIAs, personally independently reviewed imaging studies and stroke evaluation results and answered questions.Continue aspirin 81 mg daily  for secondary stroke prevention and maintain strict control of hypertension with blood pressure goal below 130/90, diabetes with hemoglobin A1c goal below 6.5% and lipids with LDL cholesterol goal below 70 mg/dL. I also advised the patient to eat a healthy diet with plenty of whole grains, cereals, fruits and vegetables, exercise regularly and maintain ideal body weight. I encouraged him to participate in activities for cognitive challenge like playing bridge, sudoku, crossword puzzles. He was also advised she'll start taking nutritional supplement Prevagen daily to improve memory. We also talked about memory compensation strategies Followup in the future with  stroke nurse practitioner in one year or call earlier if necessary.  Return in about 1 year (around 06/26/2016).  Antony Contras, MD  06/27/2015, 12:29 PM [facility] [address] [place], [place] [address] [phone]  Note: This document was prepared with digital dictation and possible smart phrase technology. Any transcriptional errors that result from this process are unintentional. ,

## 2015-06-27 NOTE — Patient Instructions (Signed)
I had a long d/w patient and wife about his remote hemorrhagic stroke and mild memory loss, risk for recurrent stroke/TIAs, personally independently reviewed imaging studies and stroke evaluation results and answered questions.Continue aspirin 81 mg daily  for secondary stroke prevention and maintain strict control of hypertension with blood pressure goal below 130/90, diabetes with hemoglobin A1c goal below 6.5% and lipids with LDL cholesterol goal below 70 mg/dL. I also advised the patient to eat a healthy diet with plenty of whole grains, cereals, fruits and vegetables, exercise regularly and maintain ideal body weight. I encouraged him to participate in activities for cognitive challenge like playing bridge, sudoku, crossword puzzles. He was also advised she'll start taking nutritional supplement Prevagen daily to improve memory. We also talked about memory compensation strategies Followup in the future with  stroke nurse practitioner in one year or call earlier if necessary. Memory Compensation Strategies  1. Use "WARM" strategy.  W= write it down  A= associate it  R= repeat it  M= make a mental note  2.   You can keep a Social worker.  Use a 3-ring notebook with sections for the following: calendar, important names and phone numbers,  medications, doctors' names/phone numbers, lists/reminders, and a section to journal what you did  each day.   3.    Use a calendar to write appointments down.  4.    Write yourself a schedule for the day.  This can be placed on the calendar or in a separate section of the Memory Notebook.  Keeping a  regular schedule can help memory.  5.    Use medication organizer with sections for each day or morning/evening pills.  You may need help loading it  6.    Keep a basket, or pegboard by the door.  Place items that you need to take out with you in the basket or on the pegboard.  You may also want to  include a message board for reminders.  7.    Use sticky  notes.  Place sticky notes with reminders in a place where the task is performed.  For example: " turn off the  stove" placed by the stove, "lock the door" placed on the door at eye level, " take your medications" on  the bathroom mirror or by the place where you normally take your medications.  8.    Use alarms/timers.  Use while cooking to remind yourself to check on food or as a reminder to take your medicine, or as a  reminder to make a call, or as a reminder to perform another task, etc.

## 2015-07-25 DIAGNOSIS — I10 Essential (primary) hypertension: Secondary | ICD-10-CM | POA: Diagnosis not present

## 2015-07-25 DIAGNOSIS — I2510 Atherosclerotic heart disease of native coronary artery without angina pectoris: Secondary | ICD-10-CM | POA: Diagnosis not present

## 2015-07-25 DIAGNOSIS — E785 Hyperlipidemia, unspecified: Secondary | ICD-10-CM | POA: Diagnosis not present

## 2015-09-05 DIAGNOSIS — I10 Essential (primary) hypertension: Secondary | ICD-10-CM | POA: Diagnosis not present

## 2015-09-05 DIAGNOSIS — I2510 Atherosclerotic heart disease of native coronary artery without angina pectoris: Secondary | ICD-10-CM | POA: Diagnosis not present

## 2015-09-05 DIAGNOSIS — I6990 Unspecified sequelae of unspecified cerebrovascular disease: Secondary | ICD-10-CM | POA: Diagnosis not present

## 2015-09-05 DIAGNOSIS — Z79899 Other long term (current) drug therapy: Secondary | ICD-10-CM | POA: Diagnosis not present

## 2015-09-05 DIAGNOSIS — E7800 Pure hypercholesterolemia, unspecified: Secondary | ICD-10-CM | POA: Diagnosis not present

## 2015-12-10 DIAGNOSIS — I10 Essential (primary) hypertension: Secondary | ICD-10-CM | POA: Diagnosis not present

## 2015-12-10 DIAGNOSIS — E7800 Pure hypercholesterolemia, unspecified: Secondary | ICD-10-CM | POA: Diagnosis not present

## 2015-12-10 DIAGNOSIS — I2510 Atherosclerotic heart disease of native coronary artery without angina pectoris: Secondary | ICD-10-CM | POA: Diagnosis not present

## 2015-12-10 DIAGNOSIS — I6990 Unspecified sequelae of unspecified cerebrovascular disease: Secondary | ICD-10-CM | POA: Diagnosis not present

## 2016-01-25 DIAGNOSIS — I2510 Atherosclerotic heart disease of native coronary artery without angina pectoris: Secondary | ICD-10-CM | POA: Diagnosis not present

## 2016-01-25 DIAGNOSIS — E785 Hyperlipidemia, unspecified: Secondary | ICD-10-CM | POA: Diagnosis not present

## 2016-01-29 DIAGNOSIS — Z9842 Cataract extraction status, left eye: Secondary | ICD-10-CM | POA: Diagnosis not present

## 2016-01-29 DIAGNOSIS — H5203 Hypermetropia, bilateral: Secondary | ICD-10-CM | POA: Diagnosis not present

## 2016-01-29 DIAGNOSIS — H26491 Other secondary cataract, right eye: Secondary | ICD-10-CM | POA: Diagnosis not present

## 2016-01-29 DIAGNOSIS — H52223 Regular astigmatism, bilateral: Secondary | ICD-10-CM | POA: Diagnosis not present

## 2016-01-29 DIAGNOSIS — H43313 Vitreous membranes and strands, bilateral: Secondary | ICD-10-CM | POA: Diagnosis not present

## 2016-01-29 DIAGNOSIS — Z961 Presence of intraocular lens: Secondary | ICD-10-CM | POA: Diagnosis not present

## 2016-01-29 DIAGNOSIS — H43813 Vitreous degeneration, bilateral: Secondary | ICD-10-CM | POA: Diagnosis not present

## 2016-01-29 DIAGNOSIS — H43393 Other vitreous opacities, bilateral: Secondary | ICD-10-CM | POA: Diagnosis not present

## 2016-01-29 DIAGNOSIS — I10 Essential (primary) hypertension: Secondary | ICD-10-CM | POA: Diagnosis not present

## 2016-01-29 DIAGNOSIS — H524 Presbyopia: Secondary | ICD-10-CM | POA: Diagnosis not present

## 2016-02-07 DIAGNOSIS — I6990 Unspecified sequelae of unspecified cerebrovascular disease: Secondary | ICD-10-CM | POA: Diagnosis not present

## 2016-02-07 DIAGNOSIS — Z23 Encounter for immunization: Secondary | ICD-10-CM | POA: Diagnosis not present

## 2016-02-07 DIAGNOSIS — N400 Enlarged prostate without lower urinary tract symptoms: Secondary | ICD-10-CM | POA: Diagnosis not present

## 2016-02-07 DIAGNOSIS — I2510 Atherosclerotic heart disease of native coronary artery without angina pectoris: Secondary | ICD-10-CM | POA: Diagnosis not present

## 2016-02-07 DIAGNOSIS — E663 Overweight: Secondary | ICD-10-CM | POA: Diagnosis not present

## 2016-02-07 DIAGNOSIS — Z6829 Body mass index (BMI) 29.0-29.9, adult: Secondary | ICD-10-CM | POA: Diagnosis not present

## 2016-02-07 DIAGNOSIS — E7800 Pure hypercholesterolemia, unspecified: Secondary | ICD-10-CM | POA: Diagnosis not present

## 2016-02-07 DIAGNOSIS — Z79899 Other long term (current) drug therapy: Secondary | ICD-10-CM | POA: Diagnosis not present

## 2016-02-07 DIAGNOSIS — Z0000 Encounter for general adult medical examination without abnormal findings: Secondary | ICD-10-CM | POA: Diagnosis not present

## 2016-02-07 DIAGNOSIS — I10 Essential (primary) hypertension: Secondary | ICD-10-CM | POA: Diagnosis not present

## 2016-05-07 DIAGNOSIS — I6990 Unspecified sequelae of unspecified cerebrovascular disease: Secondary | ICD-10-CM | POA: Diagnosis not present

## 2016-05-07 DIAGNOSIS — E7800 Pure hypercholesterolemia, unspecified: Secondary | ICD-10-CM | POA: Diagnosis not present

## 2016-05-07 DIAGNOSIS — I10 Essential (primary) hypertension: Secondary | ICD-10-CM | POA: Diagnosis not present

## 2016-06-19 ENCOUNTER — Ambulatory Visit: Payer: PPO | Admitting: Nurse Practitioner

## 2016-07-25 DIAGNOSIS — E785 Hyperlipidemia, unspecified: Secondary | ICD-10-CM | POA: Diagnosis not present

## 2016-07-25 DIAGNOSIS — I2510 Atherosclerotic heart disease of native coronary artery without angina pectoris: Secondary | ICD-10-CM | POA: Diagnosis not present

## 2016-07-25 DIAGNOSIS — I10 Essential (primary) hypertension: Secondary | ICD-10-CM | POA: Diagnosis not present

## 2016-08-07 DIAGNOSIS — I6990 Unspecified sequelae of unspecified cerebrovascular disease: Secondary | ICD-10-CM | POA: Diagnosis not present

## 2016-08-07 DIAGNOSIS — I10 Essential (primary) hypertension: Secondary | ICD-10-CM | POA: Diagnosis not present

## 2016-08-07 DIAGNOSIS — Z79899 Other long term (current) drug therapy: Secondary | ICD-10-CM | POA: Diagnosis not present

## 2016-08-07 DIAGNOSIS — E7800 Pure hypercholesterolemia, unspecified: Secondary | ICD-10-CM | POA: Diagnosis not present

## 2016-08-20 ENCOUNTER — Encounter: Payer: Self-pay | Admitting: Neurology

## 2016-08-20 ENCOUNTER — Ambulatory Visit (INDEPENDENT_AMBULATORY_CARE_PROVIDER_SITE_OTHER): Payer: PPO | Admitting: Neurology

## 2016-08-20 VITALS — BP 127/72 | HR 57 | Ht 68.0 in | Wt 193.6 lb

## 2016-08-20 DIAGNOSIS — G3184 Mild cognitive impairment, so stated: Secondary | ICD-10-CM

## 2016-08-20 NOTE — Patient Instructions (Signed)
I had a long discussion with the patient and his wife regarding his remote intracerebral hemorrhage, mild cognitive impairment both of which appear to be stable. I recommend he continue participation in cognitively challenging tasks like solving crossword puzzles, playing bridge, sudoku or word searches. Continue aspirin for stroke prevention with strict control of hypertension with blood pressure goal below 130/90 and lipids with LDL cholesterol goal below 70 mg percent. Since it has been 3 years since his stroke I do not recommend routine scheduled follow-up appointment with me is necessary. He may continue follow-up with primary care physician and may be referred back in the future if needed.

## 2016-08-20 NOTE — Progress Notes (Signed)
PATIENT: Walter Larsen DOB: 1935/04/20  REASON FOR VISIT: ICH routine follow up HISTORY FROM: patient  HISTORY OF PRESENT ILLNESS: Walter Larsen is a 81 y.o. male with Past medical history of hypertension, dyslipidemia, recent admission to [facility] with hemorrhagic stroke. The patient is admitted for altered mental status, hallucinations and confabulation. Patient also had some frontal headache, some nausea and an episode of urinary incontinence. Cranial CT scan showed right temporal occipital parenchymal hematoma measuring up to 7 cm in light of trace intraventricular extension without hydrocephalus. He was treated with broad spectrum antiboitics for fever with leucocytosis. MRI Brain 03/05/2013 showed a large subacute hematoma right temporoparietal lobe as well as small areas of subacute hematoma in the right occipital parietal lobe and left temporal parietal lobe. Neurosurgery Dr. Ellene Route consulted advise conservative care in reference of right parietal ICH. TEE completed showing no thrombus without PFO no vegetation. CT of the chest 03/08/2013 showed bilateral lobar and segmental pulmonary emboli and BLE dopplers showed DVTs involving the right common femoral vein and right peroneal vein as well as deep vein thrombosis left peroneal vein. Greenfield filter placed 03/08/2012 by IVR and Depakote was added for headache as well as seizure prevention. He was admitted to rehab for inpatient program 03/15/13-04/06/13. Placed on seroquel to help with night time restlessness as well as insomnia. On ritalin for attention and tolerating this without difficulty. He was discharged to home on 04/06/14 at [facility] assist level for ambulationHe has completed HHPT/OT and is to be evaluated for outpatient therapies tomorrow. Is ambulating at home with use of wife's walker.  BP in office today is 117/69. He states he is doing well since hospital discharge. He had home health PT,OT, and ST for 2-3 weeks after  discharge. Left homonymous hemianopsia remains. His cognitive functioning is subjectively slower; improving.   UPDATE 09/07/13 (LL):  Since last visit, patient has had slow steady recovery.  He has continued to have headaches since the stroke; they are frontotemporal, with dull aching.  He is taking Tylenol as needed for the headaches.   The headaches are slowly improving.  Blood pressure is well controlled, it is 113/67 in the office today.  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  His son had to bring him home early several days and he is upset that he cannot do more.  He feels like his legs are going to give out on him, and he must rest often.  He feels like his peripheral vision has improved, but his wife tells me that he still cannot see in the left peripheral field, often bumping into things.    UPDATE 12/16/2013 : He returns for followup after last visit 3 months ago. He continues to have persistent left-sided vision difficulties which appear unchanged. He however feels his short term memory and cognitive difficulties are slightly better. Still not driving but wants to drive. He has undergone surgery for trigger finger in the right hand. He is currently bothered by bursitis in his hip and is thinking of having injection of surgery for that as well. He states his headaches have now gone he cannot remember the last time he had a headache. He is still taking Depakote ER 500 once a day. His blood pressure is under good control. He has no new complaints today. Update 12/25/2014 : He returns for  follow-up after last visit 1 year ago. He states is doing well and has only mild residual left-sided weakness though visual deficits persist. Patient tried going back to work but was unable to cope and primary physician asked him to quit. Still has mild memory and cognitive difficulties. He however is able to work  around the house fairly well. He states his blood pressure is well controlled and today it is 104/60. He wants to drive but clearly still has persistent cognitive and vision difficulties. Update 06/27/2015 ; he returns for follow-up after last visit 6 months ago. Is accompanied by his wife. He continues to have mild short-term memory difficulties which are unchanged. He still has some persistent left-sided vision loss which is also unchanged. He has not been driving a lot and limits his driving only to his property. He remains on aspirin which is tolerating well without bleeding but occasional bruising. He states his blood pressure is well controlled and today it is 102/60. Patient does not participate in mentally challenging exercises but is otherwise physically quite active. He has no new complaints today. Update 08/20/2016 : He returns for follow-up after last visit a year ago. Is accompanied by his wife. He continues to do well without recurrent stroke or TIA symptoms. He has mild short-term memory difficulties but these are stable and not progressive. He feels his peripheral vision loss has improved somewhat. He is quite active and has been careful with his driving. Is tolerating aspirin well without bruising or bleeding. His blood pressure is well controlled. He has no new complaints today. He has not had any new health problems since last visit REVIEW OF SYSTEMS: Full 14 system review of systems performed and notable only for:  Ringing in the ears, joint pain memory loss and all other systems negative  and all other systems negative   ALLERGIES: Allergies  Allergen Reactions  . Ativan [Lorazepam]     confusion    HOME MEDICATIONS: Outpatient Medications Prior to Visit  Medication Sig Dispense Refill  . acetaminophen (TYLENOL) 325 MG tablet Take 650 mg by mouth every 6 (six) hours as needed.    Marland Kitchen aspirin EC 81 MG tablet Take 81 mg by mouth.    Marland Kitchen atorvastatin (LIPITOR) 80 MG tablet Take 80 mg by  mouth.    . calcium carbonate (CALCIUM 600) 600 MG TABS tablet Take by mouth.    . esomeprazole (NEXIUM) 40 MG capsule Take 40 mg by mouth daily before breakfast.    . fenofibrate (TRICOR) 145 MG tablet Take 145 mg by mouth at bedtime.    . Glucosamine-Chondroitin (OSTEO BI-FLEX REGULAR STRENGTH PO) Take 1 tablet by mouth daily with breakfast.    . metoprolol (LOPRESSOR) 50 MG tablet Take 1 tablet (50 mg total) by mouth 2 (two) times daily. 60 tablet 1  . Multiple Vitamin (MULTIVITAMIN WITH MINERALS) TABS tablet Take 1 tablet by mouth daily with breakfast.    . nitroGLYCERIN (NITROSTAT) 0.4 MG SL tablet Place 0.4 mg under the tongue.    . Omega-3 Fatty Acids (FISH OIL PO) Take 1 capsule by mouth daily with breakfast.    . tamsulosin (FLOMAX) 0.4 MG CAPS capsule Take 1 capsule (0.4 mg total) by mouth daily after breakfast. 30 capsule 0   No facility-administered medications prior to visit.     PHYSICAL EXAM  Vitals:   08/20/16 1624  BP: 127/72  Pulse: (!) 57  Weight: 193 lb 9.6 oz (87.8 kg)  Height: 5\' 8"  (1.727 m)  Body mass index is 29.44 kg/m. No exam data present  Generalized: Mildly obese Caucasian middle-aged male in no acute distress  Head: normocephalic and atraumatic.   Neck: Supple, no carotid bruits  Cardiac: Regular rate rhythm, no murmur  Musculoskeletal: mild kyphosis  Neurological examination  Mentation: Alert to place, history taking. Follows all commands speech and language fluent. Recall diminished 2/3. Intact attention registration and follows three-step commands. Mini-Mental status exam not done. Recall 2/3.Animal naming test 15. Clock drawing 4/4. Able to copy intersecting pentagons. Cranial nerve II-XII: Pupils were equal round reactive to light extraocular movements were full, Visual fields show partial left hemianopsia. Facial sensation and strength were normal. hearing was intact to finger rubbing bilaterally. Uvula tongue midline. head turning and shoulder  shrug and were normal and symmetric.Tongue protrusion into cheek strength was normal.  Motor: normal bulk and tone, full strength in the BUE, BLE, fine finger movements normal, no pronator drift. No focal weakness  Sensory: normal and symmetric to light touch, pinprick, and vibration  Coordination: finger-nose-finger, heel-to-shin bilaterally, no dysmetria  Reflexes: Deep tendon reflexes in the upper and lower extremities are present and symmetric.  Gait and Station: Rising up from seated position with mild difficulty, moderate stride, fragmented turning, unable to perform tiptoe, and heel walking without difficulty. Using cane for stability.  ASSESSMENT AND PLAN Walter Larsen is a 81 y.o. male who  Had a large   hematoma right temporoparietal lobe in November 2014. T Patient with resultant left hemianopsia. Mild cognitive difficulties appear stable.Marland Kitchen  PLAN:  I had a long discussion with the patient and his wife regarding his remote intracerebral hemorrhage, mild cognitive impairment both of which appear to be stable. I recommend he continue participation in cognitively challenging tasks like solving crossword puzzles, playing bridge, sudoku or word searches. Continue aspirin for stroke prevention with strict control of hypertension with blood pressure goal below 130/90 and lipids with LDL cholesterol goal below 70 mg percent. Greater than 50% time during this 25 minute visit was spent on counseling and coordination of care but his mild cognitive impairment, remote stroke and answering questions Since it has been 3 years since his stroke I do not recommend routine scheduled follow-up appointment with me is necessary. He may continue follow-up with primary care physician and may be referred back in the future if needed. Return if symptoms worsen or fail to improve.  Antony Contras, MD  08/20/2016, 4:51 PM [facility] [address], [place],  [address] [phone]  Note: This document was prepared with digital dictation and possible smart phrase technology. Any transcriptional errors that result from this process are unintentional. ,

## 2016-11-12 DIAGNOSIS — K219 Gastro-esophageal reflux disease without esophagitis: Secondary | ICD-10-CM | POA: Diagnosis not present

## 2016-11-12 DIAGNOSIS — I10 Essential (primary) hypertension: Secondary | ICD-10-CM | POA: Diagnosis not present

## 2016-11-12 DIAGNOSIS — E7800 Pure hypercholesterolemia, unspecified: Secondary | ICD-10-CM | POA: Diagnosis not present

## 2017-02-18 DIAGNOSIS — Z23 Encounter for immunization: Secondary | ICD-10-CM | POA: Diagnosis not present

## 2017-02-23 DIAGNOSIS — Z0000 Encounter for general adult medical examination without abnormal findings: Secondary | ICD-10-CM | POA: Diagnosis not present

## 2017-02-23 DIAGNOSIS — Z9181 History of falling: Secondary | ICD-10-CM | POA: Diagnosis not present

## 2017-02-23 DIAGNOSIS — E663 Overweight: Secondary | ICD-10-CM | POA: Diagnosis not present

## 2017-02-23 DIAGNOSIS — F0150 Vascular dementia without behavioral disturbance: Secondary | ICD-10-CM | POA: Diagnosis not present

## 2017-02-23 DIAGNOSIS — I2510 Atherosclerotic heart disease of native coronary artery without angina pectoris: Secondary | ICD-10-CM | POA: Diagnosis not present

## 2017-02-23 DIAGNOSIS — Z79899 Other long term (current) drug therapy: Secondary | ICD-10-CM | POA: Diagnosis not present

## 2017-02-23 DIAGNOSIS — E7800 Pure hypercholesterolemia, unspecified: Secondary | ICD-10-CM | POA: Diagnosis not present

## 2017-02-23 DIAGNOSIS — I10 Essential (primary) hypertension: Secondary | ICD-10-CM | POA: Diagnosis not present

## 2017-02-23 DIAGNOSIS — Z6829 Body mass index (BMI) 29.0-29.9, adult: Secondary | ICD-10-CM | POA: Diagnosis not present

## 2017-02-23 DIAGNOSIS — K219 Gastro-esophageal reflux disease without esophagitis: Secondary | ICD-10-CM | POA: Diagnosis not present

## 2017-05-25 DIAGNOSIS — E7800 Pure hypercholesterolemia, unspecified: Secondary | ICD-10-CM | POA: Diagnosis not present

## 2017-05-25 DIAGNOSIS — I10 Essential (primary) hypertension: Secondary | ICD-10-CM | POA: Diagnosis not present

## 2017-08-26 DIAGNOSIS — Z6829 Body mass index (BMI) 29.0-29.9, adult: Secondary | ICD-10-CM | POA: Diagnosis not present

## 2017-08-26 DIAGNOSIS — K219 Gastro-esophageal reflux disease without esophagitis: Secondary | ICD-10-CM | POA: Diagnosis not present

## 2017-08-26 DIAGNOSIS — Z79899 Other long term (current) drug therapy: Secondary | ICD-10-CM | POA: Diagnosis not present

## 2017-08-26 DIAGNOSIS — I10 Essential (primary) hypertension: Secondary | ICD-10-CM | POA: Diagnosis not present

## 2017-08-26 DIAGNOSIS — E663 Overweight: Secondary | ICD-10-CM | POA: Diagnosis not present

## 2017-08-26 DIAGNOSIS — E7800 Pure hypercholesterolemia, unspecified: Secondary | ICD-10-CM | POA: Diagnosis not present

## 2017-12-03 DIAGNOSIS — Z6828 Body mass index (BMI) 28.0-28.9, adult: Secondary | ICD-10-CM | POA: Diagnosis not present

## 2017-12-03 DIAGNOSIS — E7800 Pure hypercholesterolemia, unspecified: Secondary | ICD-10-CM | POA: Diagnosis not present

## 2017-12-03 DIAGNOSIS — I2510 Atherosclerotic heart disease of native coronary artery without angina pectoris: Secondary | ICD-10-CM | POA: Diagnosis not present

## 2017-12-03 DIAGNOSIS — I6990 Unspecified sequelae of unspecified cerebrovascular disease: Secondary | ICD-10-CM | POA: Diagnosis not present

## 2017-12-03 DIAGNOSIS — Z0000 Encounter for general adult medical examination without abnormal findings: Secondary | ICD-10-CM | POA: Diagnosis not present

## 2017-12-03 DIAGNOSIS — I10 Essential (primary) hypertension: Secondary | ICD-10-CM | POA: Diagnosis not present

## 2017-12-03 DIAGNOSIS — K219 Gastro-esophageal reflux disease without esophagitis: Secondary | ICD-10-CM | POA: Diagnosis not present

## 2017-12-03 DIAGNOSIS — E663 Overweight: Secondary | ICD-10-CM | POA: Diagnosis not present

## 2017-12-03 DIAGNOSIS — N400 Enlarged prostate without lower urinary tract symptoms: Secondary | ICD-10-CM | POA: Diagnosis not present

## 2018-01-18 DIAGNOSIS — S79911A Unspecified injury of right hip, initial encounter: Secondary | ICD-10-CM | POA: Diagnosis not present

## 2018-01-18 DIAGNOSIS — M47816 Spondylosis without myelopathy or radiculopathy, lumbar region: Secondary | ICD-10-CM | POA: Diagnosis not present

## 2018-01-18 DIAGNOSIS — M25551 Pain in right hip: Secondary | ICD-10-CM | POA: Diagnosis not present

## 2018-01-18 DIAGNOSIS — Z23 Encounter for immunization: Secondary | ICD-10-CM | POA: Diagnosis not present

## 2018-01-18 DIAGNOSIS — R296 Repeated falls: Secondary | ICD-10-CM | POA: Diagnosis not present

## 2018-03-15 DIAGNOSIS — E7800 Pure hypercholesterolemia, unspecified: Secondary | ICD-10-CM | POA: Diagnosis not present

## 2018-03-15 DIAGNOSIS — I10 Essential (primary) hypertension: Secondary | ICD-10-CM | POA: Diagnosis not present

## 2018-03-15 DIAGNOSIS — Z79899 Other long term (current) drug therapy: Secondary | ICD-10-CM | POA: Diagnosis not present

## 2018-03-15 DIAGNOSIS — Z6829 Body mass index (BMI) 29.0-29.9, adult: Secondary | ICD-10-CM | POA: Diagnosis not present

## 2018-04-01 ENCOUNTER — Encounter: Payer: Self-pay | Admitting: Cardiology

## 2018-04-01 ENCOUNTER — Ambulatory Visit (INDEPENDENT_AMBULATORY_CARE_PROVIDER_SITE_OTHER): Payer: Self-pay | Admitting: Cardiology

## 2018-04-01 VITALS — BP 136/70 | HR 64 | Ht 68.0 in | Wt 195.0 lb

## 2018-04-01 DIAGNOSIS — I2510 Atherosclerotic heart disease of native coronary artery without angina pectoris: Secondary | ICD-10-CM

## 2018-04-01 DIAGNOSIS — I10 Essential (primary) hypertension: Secondary | ICD-10-CM

## 2018-04-01 DIAGNOSIS — E785 Hyperlipidemia, unspecified: Secondary | ICD-10-CM

## 2018-04-01 NOTE — Patient Instructions (Signed)
Medication Instructions:  Your physician recommends that you continue on your current medications as directed. Please refer to the Current Medication list given to you today.  If you need a refill on your cardiac medications before your next appointment, please call your pharmacy.   Lab work: None If you have labs (blood work) drawn today and your tests are completely normal, you will receive your results only by: . MyChart Message (if you have MyChart) OR . A paper copy in the mail If you have any lab test that is abnormal or we need to change your treatment, we will call you to review the results.  Testing/Procedures: None  Follow-Up: At [facility], you and your health needs are our priority.  As part of our continuing mission to provide you with exceptional heart care, we have created designated Provider Care Teams.  These Care Teams include your primary Cardiologist (physician) and Advanced Practice Providers (APPs -  Physician Assistants and Nurse Practitioners) who all work together to provide you with the care you need, when you need it. You will need a follow up appointment in 6 months.  Please call our office 2 months in advance to schedule this appointment.  You may see No primary care provider on file. or another member of our [facility] Provider Team in [place]: Robert Krasowski, MD . Brian Munley, MD  Any Other Special Instructions Will Be Listed Below (If Applicable).    

## 2018-04-01 NOTE — Progress Notes (Signed)
Cardiology Office Note:    Date:  04/01/2018   ID:  Walter Larsen, DOB 1935-09-27, MRN [id number]  PCP:  Greig Right, MD  Cardiologist:  Jenean Lindau, MD   Referring MD: Greig Right, MD    ASSESSMENT:    1. Coronary artery disease involving native coronary artery of native heart without angina pectoris   2. Essential hypertension, benign   3. Dyslipidemia    PLAN:    In order of problems listed above:  1. Secondary prevention stressed with the patient.  Importance of compliance with diet and medication stressed and he vocalized understanding.  His blood pressure is stable.  Diet was discussed for dyslipidemia and the paperwork I had for his lipids was unremarkable and fine. 2. Patient will be seen in follow-up appointment in 6 months or earlier if the patient has any concerns    Medication Adjustments/Labs and Tests Ordered: Current medicines are reviewed at length with the patient today.  Concerns regarding medicines are outlined above.  No orders of the defined types were placed in this encounter.  No orders of the defined types were placed in this encounter.    No chief complaint on file.    History of Present Illness:    Walter Larsen is a 82 y.o. male.  Patient has known coronary artery disease.  He denies any problems at this time and takes care of activities of daily living.  No chest pain orthopnea or PND.  He ambulates with a walker.  At the time of my evaluation, the patient is alert awake oriented and in no distress.  Past Medical History:  Diagnosis Date  . Coronary artery disease    3 stents  . Stroke [facility])     Past Surgical History:  Procedure Laterality Date  . TEE WITHOUT CARDIOVERSION N/A 03/07/2013   Procedure: TRANSESOPHAGEAL ECHOCARDIOGRAM (TEE);  Surgeon: Fay Records, MD;  Location: [facility] ENDOSCOPY;  Service: Cardiovascular;  Laterality: N/A;    Current Medications: Current Meds  Medication Sig  . acetaminophen (TYLENOL) 325 MG  tablet Take 650 mg by mouth every 6 (six) hours as needed.  Marland Kitchen aspirin EC 81 MG tablet Take 81 mg by mouth daily.   Marland Kitchen atorvastatin (LIPITOR) 80 MG tablet Take 80 mg by mouth daily at 6 PM.   . calcium carbonate (CALCIUM 600) 600 MG TABS tablet Take 600 mg by mouth daily with breakfast.   . esomeprazole (NEXIUM) 40 MG capsule Take 40 mg by mouth daily before breakfast.  . fenofibrate (TRICOR) 145 MG tablet Take 145 mg by mouth at bedtime.  . Glucosamine-Chondroitin (OSTEO BI-FLEX REGULAR STRENGTH PO) Take 1 tablet by mouth daily with breakfast.  . metoprolol (LOPRESSOR) 50 MG tablet Take 1 tablet (50 mg total) by mouth 2 (two) times daily. (Patient taking differently: Take 50 mg by mouth daily. )  . Multiple Vitamin (MULTIVITAMIN WITH MINERALS) TABS tablet Take 1 tablet by mouth daily with breakfast.  . nitroGLYCERIN (NITROSTAT) 0.4 MG SL tablet Place 0.4 mg under the tongue.  . Omega-3 Fatty Acids (FISH OIL PO) Take 1 capsule by mouth daily with breakfast.  . tamsulosin (FLOMAX) 0.4 MG CAPS capsule Take 1 capsule (0.4 mg total) by mouth daily after breakfast.     Allergies:   Ativan [lorazepam]   Social History   Socioeconomic History  . Marital status: Single    Spouse name: Not on file  . Number of children: 2  . Years of education: 12th  .  Highest education level: Not on file  Occupational History  . Occupation: retired  Scientific laboratory technician  . Financial resource strain: Not on file  . Food insecurity:    Worry: Not on file    Inability: Not on file  . Transportation needs:    Medical: Not on file    Non-medical: Not on file  Tobacco Use  . Smoking status: Never Smoker  . Smokeless tobacco: Never Used  Substance and Sexual Activity  . Alcohol use: No  . Drug use: No  . Sexual activity: Not on file  Lifestyle  . Physical activity:    Days per week: Not on file    Minutes per session: Not on file  . Stress: Not on file  Relationships  . Social connections:    Talks on phone:  Not on file    Gets together: Not on file    Attends religious service: Not on file    Active member of club or organization: Not on file    Attends meetings of clubs or organizations: Not on file    Relationship status: Not on file  Other Topics Concern  . Not on file  Social History Narrative  . Not on file     Family History: The patient's family history includes Hypertension in his father and mother.  ROS:   Please see the history of present illness.    All other systems reviewed and are negative.  EKGs/Labs/Other Studies Reviewed:    The following studies were reviewed today: I discussed my findings with the patient at extensive length.  EKG reveals sinus rhythm and nonspecific ST-T changes.   Recent Labs: No results found for requested labs within last 8760 hours.  Recent Lipid Panel No results found for: CHOL, TRIG, HDL, CHOLHDL, VLDL, LDLCALC, LDLDIRECT  Physical Exam:    VS:  BP 136/70 (BP Location: Right Arm, Patient Position: Sitting, Cuff Size: Normal)   Pulse 64   Ht 5\' 8"  (1.727 m)   Wt 195 lb (88.5 kg)   SpO2 98%   BMI 29.65 kg/m     Wt Readings from Last 3 Encounters:  04/01/18 195 lb (88.5 kg)  08/20/16 193 lb 9.6 oz (87.8 kg)  06/27/15 186 lb 3.2 oz (84.5 kg)     GEN: Patient is in no acute distress HEENT: Normal NECK: No JVD; No carotid bruits LYMPHATICS: No lymphadenopathy CARDIAC: Hear sounds regular, 2/6 systolic murmur at the apex. RESPIRATORY:  Clear to auscultation without rales, wheezing or rhonchi  ABDOMEN: Soft, non-tender, non-distended MUSCULOSKELETAL:  No edema; No deformity  SKIN: Warm and dry NEUROLOGIC:  Alert and oriented x 3 PSYCHIATRIC:  Normal affect   Signed, Jenean Lindau, MD  04/01/2018 11:38 AM    [place]

## 2018-06-02 DIAGNOSIS — H26491 Other secondary cataract, right eye: Secondary | ICD-10-CM | POA: Diagnosis not present

## 2018-06-02 DIAGNOSIS — I10 Essential (primary) hypertension: Secondary | ICD-10-CM | POA: Diagnosis not present

## 2018-06-02 DIAGNOSIS — H26492 Other secondary cataract, left eye: Secondary | ICD-10-CM | POA: Diagnosis not present

## 2018-06-02 DIAGNOSIS — Z961 Presence of intraocular lens: Secondary | ICD-10-CM | POA: Diagnosis not present

## 2018-06-14 DIAGNOSIS — Z6829 Body mass index (BMI) 29.0-29.9, adult: Secondary | ICD-10-CM | POA: Diagnosis not present

## 2018-06-14 DIAGNOSIS — I6990 Unspecified sequelae of unspecified cerebrovascular disease: Secondary | ICD-10-CM | POA: Diagnosis not present

## 2018-06-14 DIAGNOSIS — E663 Overweight: Secondary | ICD-10-CM | POA: Diagnosis not present

## 2018-06-14 DIAGNOSIS — I2510 Atherosclerotic heart disease of native coronary artery without angina pectoris: Secondary | ICD-10-CM | POA: Diagnosis not present

## 2018-06-14 DIAGNOSIS — E7800 Pure hypercholesterolemia, unspecified: Secondary | ICD-10-CM | POA: Diagnosis not present

## 2018-06-14 DIAGNOSIS — I10 Essential (primary) hypertension: Secondary | ICD-10-CM | POA: Diagnosis not present

## 2018-08-31 DIAGNOSIS — H26491 Other secondary cataract, right eye: Secondary | ICD-10-CM | POA: Diagnosis not present

## 2018-09-16 DIAGNOSIS — K219 Gastro-esophageal reflux disease without esophagitis: Secondary | ICD-10-CM | POA: Diagnosis not present

## 2018-09-16 DIAGNOSIS — I10 Essential (primary) hypertension: Secondary | ICD-10-CM | POA: Diagnosis not present

## 2018-09-16 DIAGNOSIS — Z6829 Body mass index (BMI) 29.0-29.9, adult: Secondary | ICD-10-CM | POA: Diagnosis not present

## 2018-09-16 DIAGNOSIS — E663 Overweight: Secondary | ICD-10-CM | POA: Diagnosis not present

## 2018-09-16 DIAGNOSIS — R202 Paresthesia of skin: Secondary | ICD-10-CM | POA: Diagnosis not present

## 2018-09-16 DIAGNOSIS — E7800 Pure hypercholesterolemia, unspecified: Secondary | ICD-10-CM | POA: Diagnosis not present

## 2018-10-08 DIAGNOSIS — H26492 Other secondary cataract, left eye: Secondary | ICD-10-CM | POA: Diagnosis not present

## 2018-11-14 ENCOUNTER — Inpatient Hospital Stay (HOSPITAL_COMMUNITY): Payer: Medicare HMO

## 2018-11-14 ENCOUNTER — Emergency Department (HOSPITAL_COMMUNITY): Payer: Medicare HMO

## 2018-11-14 ENCOUNTER — Encounter (HOSPITAL_COMMUNITY): Payer: Self-pay | Admitting: *Deleted

## 2018-11-14 ENCOUNTER — Other Ambulatory Visit: Payer: Self-pay

## 2018-11-14 ENCOUNTER — Inpatient Hospital Stay (HOSPITAL_COMMUNITY)
Admission: EM | Admit: 2018-11-14 | Discharge: 2018-11-19 | DRG: 064 | Disposition: A | Discharge status: Rehabilitation Facility | Payer: Medicare HMO | Attending: Internal Medicine | Admitting: Internal Medicine

## 2018-11-14 DIAGNOSIS — C799 Secondary malignant neoplasm of unspecified site: Secondary | ICD-10-CM

## 2018-11-14 DIAGNOSIS — R402242 Coma scale, best verbal response, confused conversation, at arrival to emergency department: Secondary | ICD-10-CM | POA: Diagnosis present

## 2018-11-14 DIAGNOSIS — E854 Organ-limited amyloidosis: Secondary | ICD-10-CM | POA: Diagnosis present

## 2018-11-14 DIAGNOSIS — Z79899 Other long term (current) drug therapy: Secondary | ICD-10-CM

## 2018-11-14 DIAGNOSIS — D72823 Leukemoid reaction: Secondary | ICD-10-CM | POA: Diagnosis not present

## 2018-11-14 DIAGNOSIS — I611 Nontraumatic intracerebral hemorrhage in hemisphere, cortical: Secondary | ICD-10-CM

## 2018-11-14 DIAGNOSIS — N401 Enlarged prostate with lower urinary tract symptoms: Secondary | ICD-10-CM | POA: Diagnosis present

## 2018-11-14 DIAGNOSIS — I619 Nontraumatic intracerebral hemorrhage, unspecified: Secondary | ICD-10-CM | POA: Diagnosis not present

## 2018-11-14 DIAGNOSIS — R402362 Coma scale, best motor response, obeys commands, at arrival to emergency department: Secondary | ICD-10-CM | POA: Diagnosis present

## 2018-11-14 DIAGNOSIS — R4182 Altered mental status, unspecified: Secondary | ICD-10-CM | POA: Diagnosis not present

## 2018-11-14 DIAGNOSIS — K219 Gastro-esophageal reflux disease without esophagitis: Secondary | ICD-10-CM | POA: Diagnosis present

## 2018-11-14 DIAGNOSIS — Z20828 Contact with and (suspected) exposure to other viral communicable diseases: Secondary | ICD-10-CM | POA: Diagnosis present

## 2018-11-14 DIAGNOSIS — I629 Nontraumatic intracranial hemorrhage, unspecified: Secondary | ICD-10-CM | POA: Diagnosis not present

## 2018-11-14 DIAGNOSIS — R339 Retention of urine, unspecified: Secondary | ICD-10-CM | POA: Diagnosis not present

## 2018-11-14 DIAGNOSIS — E785 Hyperlipidemia, unspecified: Secondary | ICD-10-CM | POA: Diagnosis present

## 2018-11-14 DIAGNOSIS — Z7189 Other specified counseling: Secondary | ICD-10-CM | POA: Diagnosis not present

## 2018-11-14 DIAGNOSIS — R29707 NIHSS score 7: Secondary | ICD-10-CM | POA: Diagnosis present

## 2018-11-14 DIAGNOSIS — R402142 Coma scale, eyes open, spontaneous, at arrival to emergency department: Secondary | ICD-10-CM | POA: Diagnosis present

## 2018-11-14 DIAGNOSIS — J9811 Atelectasis: Secondary | ICD-10-CM | POA: Diagnosis not present

## 2018-11-14 DIAGNOSIS — I680 Cerebral amyloid angiopathy: Secondary | ICD-10-CM | POA: Diagnosis present

## 2018-11-14 DIAGNOSIS — R410 Disorientation, unspecified: Secondary | ICD-10-CM

## 2018-11-14 DIAGNOSIS — Z955 Presence of coronary angioplasty implant and graft: Secondary | ICD-10-CM

## 2018-11-14 DIAGNOSIS — G936 Cerebral edema: Secondary | ICD-10-CM

## 2018-11-14 DIAGNOSIS — D72829 Elevated white blood cell count, unspecified: Secondary | ICD-10-CM | POA: Diagnosis not present

## 2018-11-14 DIAGNOSIS — Z86718 Personal history of other venous thrombosis and embolism: Secondary | ICD-10-CM

## 2018-11-14 DIAGNOSIS — R338 Other retention of urine: Secondary | ICD-10-CM | POA: Diagnosis present

## 2018-11-14 DIAGNOSIS — Z03818 Encounter for observation for suspected exposure to other biological agents ruled out: Secondary | ICD-10-CM | POA: Diagnosis not present

## 2018-11-14 DIAGNOSIS — R1310 Dysphagia, unspecified: Secondary | ICD-10-CM | POA: Diagnosis not present

## 2018-11-14 DIAGNOSIS — R451 Restlessness and agitation: Secondary | ICD-10-CM | POA: Diagnosis not present

## 2018-11-14 DIAGNOSIS — H47619 Cortical blindness, unspecified side of brain: Secondary | ICD-10-CM | POA: Diagnosis not present

## 2018-11-14 DIAGNOSIS — I10 Essential (primary) hypertension: Secondary | ICD-10-CM | POA: Diagnosis not present

## 2018-11-14 DIAGNOSIS — I639 Cerebral infarction, unspecified: Secondary | ICD-10-CM | POA: Diagnosis not present

## 2018-11-14 DIAGNOSIS — Z888 Allergy status to other drugs, medicaments and biological substances status: Secondary | ICD-10-CM | POA: Diagnosis not present

## 2018-11-14 DIAGNOSIS — N400 Enlarged prostate without lower urinary tract symptoms: Secondary | ICD-10-CM | POA: Diagnosis not present

## 2018-11-14 DIAGNOSIS — E876 Hypokalemia: Secondary | ICD-10-CM | POA: Diagnosis not present

## 2018-11-14 DIAGNOSIS — R51 Headache: Secondary | ICD-10-CM | POA: Diagnosis not present

## 2018-11-14 DIAGNOSIS — Z515 Encounter for palliative care: Secondary | ICD-10-CM | POA: Diagnosis not present

## 2018-11-14 DIAGNOSIS — I2510 Atherosclerotic heart disease of native coronary artery without angina pectoris: Secondary | ICD-10-CM | POA: Diagnosis present

## 2018-11-14 DIAGNOSIS — G9389 Other specified disorders of brain: Secondary | ICD-10-CM | POA: Diagnosis present

## 2018-11-14 DIAGNOSIS — I612 Nontraumatic intracerebral hemorrhage in hemisphere, unspecified: Secondary | ICD-10-CM | POA: Diagnosis not present

## 2018-11-14 DIAGNOSIS — R1312 Dysphagia, oropharyngeal phase: Secondary | ICD-10-CM

## 2018-11-14 DIAGNOSIS — R6510 Systemic inflammatory response syndrome (SIRS) of non-infectious origin without acute organ dysfunction: Secondary | ICD-10-CM | POA: Diagnosis present

## 2018-11-14 DIAGNOSIS — I610 Nontraumatic intracerebral hemorrhage in hemisphere, subcortical: Secondary | ICD-10-CM | POA: Diagnosis not present

## 2018-11-14 DIAGNOSIS — R5383 Other fatigue: Secondary | ICD-10-CM | POA: Diagnosis not present

## 2018-11-14 LAB — DIFFERENTIAL
Abs Immature Granulocytes: 0.04 10*3/uL (ref 0.00–0.07)
Basophils Absolute: 0 10*3/uL (ref 0.0–0.1)
Basophils Relative: 0 %
Eosinophils Absolute: 0.2 10*3/uL (ref 0.0–0.5)
Eosinophils Relative: 2 %
Immature Granulocytes: 0 %
Lymphocytes Relative: 13 %
Lymphs Abs: 1.3 10*3/uL (ref 0.7–4.0)
Monocytes Absolute: 0.8 10*3/uL (ref 0.1–1.0)
Monocytes Relative: 8 %
Neutro Abs: 7.6 10*3/uL (ref 1.7–7.7)
Neutrophils Relative %: 77 %

## 2018-11-14 LAB — URINALYSIS, ROUTINE W REFLEX MICROSCOPIC
Bilirubin Urine: NEGATIVE
Glucose, UA: NEGATIVE mg/dL
Hgb urine dipstick: NEGATIVE
Ketones, ur: NEGATIVE mg/dL
Leukocytes,Ua: NEGATIVE
Nitrite: NEGATIVE
Protein, ur: NEGATIVE mg/dL
Specific Gravity, Urine: 1.009 (ref 1.005–1.030)
pH: 7 (ref 5.0–8.0)

## 2018-11-14 LAB — I-STAT CHEM 8, ED
BUN: 16 mg/dL (ref 8–23)
Calcium, Ion: 1.25 mmol/L (ref 1.15–1.40)
Chloride: 104 mmol/L (ref 98–111)
Creatinine, Ser: 1.1 mg/dL (ref 0.61–1.24)
Glucose, Bld: 138 mg/dL — ABNORMAL HIGH (ref 70–99)
HCT: 44 % (ref 39.0–52.0)
Hemoglobin: 15 g/dL (ref 13.0–17.0)
Potassium: 3.6 mmol/L (ref 3.5–5.1)
Sodium: 139 mmol/L (ref 135–145)
TCO2: 24 mmol/L (ref 22–32)

## 2018-11-14 LAB — COMPREHENSIVE METABOLIC PANEL
ALT: 42 U/L (ref 0–44)
AST: 46 U/L — ABNORMAL HIGH (ref 15–41)
Albumin: 4.4 g/dL (ref 3.5–5.0)
Alkaline Phosphatase: 44 U/L (ref 38–126)
Anion gap: 15 (ref 5–15)
BUN: 13 mg/dL (ref 8–23)
CO2: 21 mmol/L — ABNORMAL LOW (ref 22–32)
Calcium: 10.3 mg/dL (ref 8.9–10.3)
Chloride: 103 mmol/L (ref 98–111)
Creatinine, Ser: 1.17 mg/dL (ref 0.61–1.24)
GFR calc Af Amer: >60 mL/min (ref >60)
GFR calc non Af Amer: 59 mL/min — ABNORMAL LOW (ref >60)
Glucose, Bld: 137 mg/dL — ABNORMAL HIGH (ref 70–99)
Potassium: 3.7 mmol/L (ref 3.5–5.1)
Sodium: 139 mmol/L (ref 135–145)
Total Bilirubin: 0.9 mg/dL (ref 0.3–1.2)
Total Protein: 7.6 g/dL (ref 6.5–8.1)

## 2018-11-14 LAB — CBC
HCT: 44.3 % (ref 39.0–52.0)
Hemoglobin: 14.6 g/dL (ref 13.0–17.0)
MCH: 31.7 pg (ref 26.0–34.0)
MCHC: 33 g/dL (ref 30.0–36.0)
MCV: 96.1 fL (ref 80.0–100.0)
Platelets: 184 10*3/uL (ref 150–400)
RBC: 4.61 MIL/uL (ref 4.22–5.81)
RDW: 12.8 % (ref 11.5–15.5)
WBC: 9.9 10*3/uL (ref 4.0–10.5)
nRBC: 0 % (ref 0.0–0.2)

## 2018-11-14 LAB — MRSA PCR SCREENING: MRSA by PCR: NEGATIVE

## 2018-11-14 LAB — RAPID URINE DRUG SCREEN, HOSP PERFORMED
Amphetamines: NOT DETECTED
Barbiturates: NOT DETECTED
Benzodiazepines: NOT DETECTED
Cocaine: NOT DETECTED
Opiates: NOT DETECTED
Tetrahydrocannabinol: NOT DETECTED

## 2018-11-14 LAB — APTT: aPTT: 30 seconds (ref 24–36)

## 2018-11-14 LAB — ETHANOL: Alcohol, Ethyl (B): <10 mg/dL (ref <10)

## 2018-11-14 LAB — PROTIME-INR
INR: 1.2 (ref 0.8–1.2)
Prothrombin Time: 14.8 seconds (ref 11.4–15.2)

## 2018-11-14 LAB — SARS CORONAVIRUS 2 BY RT PCR (HOSPITAL ORDER, PERFORMED IN ~~LOC~~ HOSPITAL LAB): SARS Coronavirus 2: NEGATIVE

## 2018-11-14 MED ORDER — SENNOSIDES-DOCUSATE SODIUM 8.6-50 MG PO TABS
1.0000 | ORAL_TABLET | Freq: Two times a day (BID) | ORAL | Status: DC
  Administered 2018-11-15 – 2018-11-19 (×8): 1 via ORAL
  Filled 2018-11-14 (×8): qty 1

## 2018-11-14 MED ORDER — DEXMEDETOMIDINE HCL IN NACL 200 MCG/50ML IV SOLN
0.4000 ug/kg/h | INTRAVENOUS | Status: DC
  Administered 2018-11-14: 0.4 ug/kg/h via INTRAVENOUS
  Administered 2018-11-14 – 2018-11-15 (×2): 0.6 ug/kg/h via INTRAVENOUS
  Administered 2018-11-15 (×3): 1 ug/kg/h via INTRAVENOUS
  Filled 2018-11-14 (×2): qty 50
  Filled 2018-11-14: qty 100
  Filled 2018-11-14 (×3): qty 50

## 2018-11-14 MED ORDER — ACETAMINOPHEN 650 MG RE SUPP: 650.0000 mg | RECTAL | Status: DC | PRN

## 2018-11-14 MED ORDER — METOPROLOL SUCCINATE ER 25 MG PO TB24
50.0000 mg | ORAL_TABLET | Freq: Every day | ORAL | Status: DC
  Administered 2018-11-15 – 2018-11-16 (×2): 50 mg via ORAL
  Filled 2018-11-14 (×3): qty 1

## 2018-11-14 MED ORDER — FENOFIBRATE 160 MG PO TABS
160.0000 mg | ORAL_TABLET | Freq: Every day | ORAL | Status: DC
  Administered 2018-11-16 – 2018-11-19 (×4): 160 mg via ORAL
  Filled 2018-11-14 (×6): qty 1

## 2018-11-14 MED ORDER — ACETAMINOPHEN 160 MG/5ML PO SOLN: 650.0000 mg | ORAL | Status: DC | PRN

## 2018-11-14 MED ORDER — ACETAMINOPHEN 325 MG PO TABS
650.0000 mg | ORAL_TABLET | ORAL | Status: DC | PRN
  Administered 2018-11-15 – 2018-11-18 (×5): 650 mg via ORAL
  Filled 2018-11-14 (×5): qty 2

## 2018-11-14 MED ORDER — CLEVIDIPINE BUTYRATE 0.5 MG/ML IV EMUL
0.0000 mg/h | INTRAVENOUS | Status: DC
  Administered 2018-11-14 (×2): 1 mg/h via INTRAVENOUS
  Filled 2018-11-14: qty 50

## 2018-11-14 MED ORDER — STROKE: EARLY STAGES OF RECOVERY BOOK
Freq: Once | Status: AC
  Administered 2018-11-14: 16:00:00
  Filled 2018-11-14: qty 1

## 2018-11-14 MED ORDER — CLEVIDIPINE BUTYRATE 0.5 MG/ML IV EMUL
0.0000 mg/h | INTRAVENOUS | Status: DC
  Administered 2018-11-14: 4 mg/h via INTRAVENOUS
  Administered 2018-11-14: 20 mg/h via INTRAVENOUS
  Administered 2018-11-15: 5 mg/h via INTRAVENOUS
  Administered 2018-11-15: 6 mg/h via INTRAVENOUS
  Filled 2018-11-14 (×5): qty 50

## 2018-11-14 MED ORDER — HALOPERIDOL LACTATE 5 MG/ML IJ SOLN
INTRAMUSCULAR | Status: AC
  Administered 2018-11-14: 1 mg
  Filled 2018-11-14: qty 1

## 2018-11-14 MED ORDER — TAMSULOSIN HCL 0.4 MG PO CAPS
0.4000 mg | ORAL_CAPSULE | Freq: Every day | ORAL | Status: DC
  Administered 2018-11-16 – 2018-11-19 (×4): 0.4 mg via ORAL
  Filled 2018-11-14 (×6): qty 1

## 2018-11-14 MED ORDER — HALOPERIDOL LACTATE 5 MG/ML IJ SOLN
1.0000 mg | Freq: Four times a day (QID) | INTRAMUSCULAR | Status: DC | PRN
  Administered 2018-11-15 – 2018-11-19 (×8): 1 mg via INTRAVENOUS
  Filled 2018-11-14 (×8): qty 1

## 2018-11-14 MED ORDER — PANTOPRAZOLE SODIUM 40 MG IV SOLR
40.0000 mg | Freq: Every day | INTRAVENOUS | Status: DC
  Administered 2018-11-14 – 2018-11-15 (×2): 40 mg via INTRAVENOUS
  Filled 2018-11-14: qty 40

## 2018-11-14 MED ORDER — LABETALOL HCL 5 MG/ML IV SOLN
20.0000 mg | Freq: Once | INTRAVENOUS | Status: AC
  Administered 2018-11-14: 20 mg via INTRAVENOUS
  Filled 2018-11-14: qty 4

## 2018-11-14 NOTE — ED Triage Notes (Signed)
Pt arrived with wife and son, unable to answer questions appropriately, does follow commands. Pt c/o nausea and headache. Per wife, pt has hx of stoke in 2014, in which symptoms now are very similar. Pt unable to see per wife at present, has visual deficits from previous stroke. Pt was unable to dress himself this morning which is abnormal. LKW 8pm last night. Grips equal, mild weakness noted to L arm

## 2018-11-14 NOTE — Progress Notes (Signed)
Pt came down for his stat MRI brain and his condition made him too uncooperative to even get in the scan room. He would have harmed himself and staff if we were to proceed. Pt condition not allowing him to have sedation either. Pt sent back to room with no imaging, RN notified.

## 2018-11-14 NOTE — H&P (Signed)
Admission H&P    Chief Complaint: Acute onset of vision changes  HPI: Walter Larsen is an 83 y.o. male with a history of CAD s/p stenting x 3, presenting with acute onset of bilateral blindness. He has a history of ICH in 2014 with residual visual deficits. LKN 8 PM on Saturday night. This morning, the family noticed that the patient was behaving as though he was completely blind. He was unable to see light/dark or perceive motion in either eye. Although he has vision loss from prior ICH, he can see well enough at baseline to perform all of his ADLs, Family also noted that he was mildly confused.   Denies headache, limb weakness, CP, SOB, limb numbness, or aphaisa. Unable to obtain more detailed ROS due to confusion.   Past Medical History:  Diagnosis Date  . Coronary artery disease    3 stents  . Stroke [facility]     Past Surgical History:  Procedure Laterality Date  . TEE WITHOUT CARDIOVERSION N/A 03/07/2013   Procedure: TRANSESOPHAGEAL ECHOCARDIOGRAM (TEE);  Surgeon: Fay Records, MD;  Location: [facility] ENDOSCOPY;  Service: Cardiovascular;  Laterality: N/A;    Family History  Problem Relation Age of Onset  . Hypertension Mother   . Hypertension Father    Social History:  reports that he has never smoked. He has never used smokeless tobacco. He reports that he does not drink alcohol or use drugs.  Allergies:  Allergies  Allergen Reactions  . Ativan [Lorazepam] Other (See Comments)    confusion    Home Medications: No current facility-administered medications on file prior to encounter.    Current Outpatient Medications on File Prior to Encounter  Medication Sig Dispense Refill  . acetaminophen (TYLENOL) 325 MG tablet Take 325-650 mg by mouth every 6 (six) hours as needed for headache.     . Calcium Carb-Cholecalciferol (CALCIUM + D3) 600-800 MG-UNIT TABS Take 1 tablet by mouth daily.    Marland Kitchen esomeprazole (NEXIUM) 40 MG capsule Take 40 mg by mouth daily before breakfast.    .  fenofibrate 160 MG tablet Take 160 mg by mouth daily.    . metoprolol succinate (TOPROL-XL) 50 MG 24 hr tablet Take 50 mg by mouth daily. Take with or immediately following a meal.    . Misc Natural Products (GLUCOSAMINE CHONDROITIN TRIPLE) TABS Take 1 tablet by mouth daily.    . Multiple Vitamin (MULTIVITAMIN WITH MINERALS) TABS tablet Take 1 tablet by mouth daily with breakfast.    . nitroGLYCERIN (NITROSTAT) 0.4 MG SL tablet Place 0.4 mg under the tongue.    . Omega-3 Fatty Acids (FISH OIL PO) Take 1 capsule by mouth daily with breakfast.    . rosuvastatin (CRESTOR) 40 MG tablet Take 40 mg by mouth daily.    . tamsulosin (FLOMAX) 0.4 MG CAPS capsule Take 1 capsule (0.4 mg total) by mouth daily after breakfast. 30 capsule 0     ROS: As per HPI. Does not endorse any additional symptoms. Unable to assess ROS in further detail due to cognitive/communication deficit.   Physical Examination: Blood pressure (!) 163/72, pulse 65, temperature 99.3 F (37.4 C), temperature source Axillary, resp. rate 17, SpO2 96 %.  HEENT-  [place]/AT  Lungs - Respirations unlabored, CTAB CV: RRR, normal heart sounds Abdomen - NT/ND Extremities - Warm and well perfused. No edema  Neurologic Examination: Mental Status: Awake and alert. Confused responses to several questions and has difficulty following some motor commands. Speech fluent. Naming intact. Has some difficulty  with comprehension. Repetition impaired.   Cranial Nerves: II:  Only vision present is to light/dark. Unable to perceive large objects held directly in front of him. Pupils 3 mm and reactive bilaterally. III,IV, VI: No ptosis. EOMI without nystagmus.  V,VII: Face symmetric. Temp equal bilaterally  VIII: hearing intact to voice IX,X: Palate elevates normally  XI: Symmetric shoulder shrug XII: midline tongue extension  Motor: Right : Upper extremity   5/5    Left:     Upper extremity   5/5  Lower extremity   5/5     Lower extremity    5/5 Normal tone throughout; no atrophy noted Sensory: Temp and light touch intact x 4 Deep Tendon Reflexes:  Unremarkable x 4 Plantars: Right: downgoing   Left: downgoing Cerebellar: No ataxia with finger-navel-finger bilaterally. No ataxia with H-S bilaterally  Gait: Deferred  Results for orders placed or performed during the hospital encounter of 11/14/18 (from the past 48 hour(s))  Ethanol     Status: None   Collection Time: 11/14/18  7:42 AM  Result Value Ref Range   Alcohol, Ethyl (B) <10 <10 mg/dL    Comment: (NOTE) Lowest detectable limit for serum alcohol is 10 mg/dL. For medical purposes only. Performed at [facility], [place] [address], [place], [place] [address]   Protime-INR     Status: None   Collection Time: 11/14/18  7:42 AM  Result Value Ref Range   Prothrombin Time 14.8 11.4 - 15.2 seconds   INR 1.2 0.8 - 1.2    Comment: (NOTE) INR goal varies based on device and disease states. Performed at [facility], [place] [address], [place], [place] [address]   APTT     Status: None   Collection Time: 11/14/18  7:42 AM  Result Value Ref Range   aPTT 30 24 - 36 seconds    Comment: Performed at [facility] [address], [place] [address]  CBC     Status: None   Collection Time: 11/14/18  7:42 AM  Result Value Ref Range   WBC 9.9 4.0 - 10.5 K/uL   RBC 4.61 4.22 - 5.81 MIL/uL   Hemoglobin 14.6 13.0 - 17.0 g/dL   HCT 44.3 39.0 - 52.0 %   MCV 96.1 80.0 - 100.0 fL   MCH 31.7 26.0 - 34.0 pg   MCHC 33.0 30.0 - 36.0 g/dL   RDW 12.8 11.5 - 15.5 %   Platelets 184 150 - 400 K/uL   nRBC 0.0 0.0 - 0.2 %    Comment: Performed at [facility], [place] [address] [place], [place] [address]  Differential     Status: None   Collection Time: 11/14/18  7:42 AM  Result Value Ref Range   Neutrophils Relative % 77 %   Neutro Abs 7.6 1.7 - 7.7 K/uL   Lymphocytes Relative 13 %   Lymphs Abs 1.3 0.7 - 4.0 K/uL   Monocytes Relative 8 %   Monocytes  Absolute 0.8 0.1 - 1.0 K/uL   Eosinophils Relative 2 %   Eosinophils Absolute 0.2 0.0 - 0.5 K/uL   Basophils Relative 0 %   Basophils Absolute 0.0 0.0 - 0.1 K/uL   Immature Granulocytes 0 %   Abs Immature Granulocytes 0.04 0.00 - 0.07 K/uL    Comment: Performed at [facility], [place] [address] [place], [place] [address]  Comprehensive metabolic panel     Status: Abnormal   Collection Time: 11/14/18  7:42 AM  Result Value Ref Range   Sodium 139 135 - 145 mmol/L   Potassium 3.7 3.5 - 5.1 mmol/L   Chloride 103 98 - 111 mmol/L   CO2 21 (L) 22 - 32 mmol/L   Glucose, Bld 137 (H) 70 - 99 mg/dL   BUN 13 8 - 23 mg/dL   Creatinine, Ser 1.17 0.61 - 1.24 mg/dL   Calcium 10.3 8.9 - 10.3 mg/dL   Total Protein 7.6 6.5 - 8.1 g/dL   Albumin 4.4 3.5 - 5.0 g/dL   AST 46 (H) 15 - 41 U/L   ALT 42 0 - 44 U/L   Alkaline Phosphatase 44 38 - 126 U/L   Total Bilirubin 0.9 0.3 - 1.2 mg/dL   GFR calc non Af Amer 59 (L) >60 mL/min   GFR calc Af Amer >60 >60 mL/min   Anion gap 15 5 - 15    Comment: Performed at [facility] [address], [place], [place] [address]  I-stat chem 8, ED     Status: Abnormal   Collection Time: 11/14/18  7:49 AM  Result Value Ref Range   Sodium 139 135 - 145 mmol/L   Potassium 3.6 3.5 - 5.1 mmol/L   Chloride 104 98 - 111 mmol/L   BUN 16 8 - 23 mg/dL   Creatinine, Ser 1.10 0.61 - 1.24 mg/dL   Glucose, Bld 138 (H) 70 - 99 mg/dL   Calcium, Ion 1.25 1.15 - 1.40 mmol/L   TCO2 24 22 - 32 mmol/L   Hemoglobin 15.0 13.0 - 17.0 g/dL   HCT 44.0 39.0 - 52.0 %   Ct Head Code Stroke Wo Contrast  Addendum Date: 11/14/2018   ADDENDUM REPORT: 11/14/2018 08:42 ADDENDUM: Study discussed by telephone with Dr. Cheral Marker on 11/14/2018 at 0827 hours. Electronically Signed   By: Genevie Ann M.D.   On: 11/14/2018 08:42   Result Date: 11/14/2018 CLINICAL DATA:  Code stroke. 83 year old male with sudden onset vision loss in both eyes. EXAM: CT HEAD WITHOUT CONTRAST TECHNIQUE: Contiguous axial  images were obtained from the base of the skull through the vertex without intravenous contrast. COMPARISON:  Brain MRI 03/05/2013. [facility] Head CT 09/23/2013. FINDINGS: Brain: Multifocal acute intra-axial hemorrhages in both cerebral hemispheres. Blood volume is greater on the right, where there is a confluent hemorrhage occupying the posterior right temporal lobe encompassing 46 x 48 by 45 millimeters (AP by transverse by CC). Smaller multifocal acute hemorrhages are present in an area of chronic right occipital pole encephalomalacia, the larger is 21 millimeters diameter on series 3, image 18. In the contralateral left lateral occipital lobe there is heterogeneous hyperdense hemorrhage encompassing 36 by 22 by 42 millimeters (AP by transverse by CC). There is associated parenchymal edema at each acute hemorrhage site. Underlying chronic right parietal and occipital lobe encephalomalacia. Mass effect on the occipital and temporal horns, but no ventriculomegaly. No intraventricular extension of blood. No midline shift. Basilar cisterns remain normal. Trace if any extra-axial extension of blood (perhaps some subarachnoid along the right sylvian fissure). On the 2014 brain MRI hemosiderin was present throughout the posterior right temporal and anterior right occipital lobes. However, there is also a subtle 9-10 millimeter hyperdense mass in the posterior left cingulate on series 3, image 22, and it is unclear whether this might be a small area of acute or chronic hemorrhage. The brainstem and cerebellum are spared. There is Patchy and confluent bilateral cerebral white matter hypodensity which is chronic. No  superimposed acute cortically based infarct identified. Vascular: Extensive Calcified atherosclerosis at the skull base. No suspicious intracranial vascular hyperdensity. Skull: No acute or suspicious osseous lesion identified. Sinuses/Orbits: Visualized paranasal sinuses and mastoids are stable and  well pneumatized. Other: Chronic postoperative changes to both globes. Bilateral orbits soft tissues appears stable and negative. Visualized scalp soft tissues are within normal limits. ASPECTS [facility] Stroke Program Early CT Score) Total score (0-10 with 10 being normal): Not applicable, acute hemorrhage. IMPRESSION: 1. Multifocal acute intra-axial hemorrhages in both posterior cerebral hemispheres, larger on the right (where collective blood volume is estimated at 55 mL). Underlying posterior right hemisphere encephalomalacia from a 2014 intra-axial hemorrhage. Additional subtle 9-10 mm hyperdense mass-like area in the posterior left cingulate. 2. Associated cerebral edema, but no midline shift or loss of basilar cisterns. No intraventricular extension. Trace extension into the subarachnoid space suspected. 3. 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 Brain MRI without and with contrast recommended when feasible to further characterize. 4. Underlying chronic white matter disease. Neurology Dr. Cheral Marker was sent a text page via the [facility] messaging system regarding critical findings on this exam at 8:02 am on 11/14/2018. Electronically Signed: By: Genevie Ann M.D. On: 11/14/2018 08:06     Assessment: 83 year old male presenting with sudden onset of blindness. CT head reveals multifocal acute intracerebral hemorrhages bilaterally. 1. Exam reveals cortical blindness. This represents worsening of a pre-existing visual processing deficit from a prior posterior right sided intracerebral hemorrhage.  2. Most likely components of the DDx for the hemorrhages: Amyloid angiopathy with multifocal hemorrhages precipitated by transient severe HTN at home, versus hemorrhagic metastases. Septic emboli with hemorrhages from mycotic aneurysms also possible.  3. The patient is not on an anticoagulant 4. CT head: Multifocal acute intra-axial hemorrhages in both posterior  cerebral hemispheres, larger on the right (where collective blood volume is estimated at 55 mL). Underlying posterior right hemisphere encephalomalacia from a 2014 intra-axial hemorrhage. Additional subtle 9-10 mm hyperdense mass-like area in the posterior left cingulate. Associated cerebral edema, but no midline shift or loss of basilar cisterns. No intraventricular extension. Trace extension into the subarachnoid space suspected.  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 Underlying chronic white matter disease is also noted.   Plan: 1. Admit to ICU under Neurology service 2. MRI brain with and without contrast 3. No antiplatelet medications or anticoagulants. DVT prophylaxis with SCDs 4. TTE 5. PT consult, OT consult, Speech consult 6. Cardiac telemetry 7. Frequent neuro checks 8. Repeat CT head in 24 hours 9. BP management with clevidipine drip and PRN labetalol. SBP goal of < 140 10. Discontinuing rosuvastatin. In cases of amyloid angiopathy, statins increase the risk of ICH  45 minutes spent in the emergent neurological evaluation and management of this critically ill patient with multiple acute intracerebral hemorrhages.   Electronically signed: Dr. Kerney Elbe 11/14/2018, 10:35 AM

## 2018-11-14 NOTE — ED Notes (Signed)
Patient transported to MRI with SWOT nurse.

## 2018-11-14 NOTE — Progress Notes (Signed)
83 y.o. year-old male with a history of stroke, CAD presenting to the ED with chief complaint headache and visual change. Last seen normal at 8 PM last night.  Family noticed early this morning 0100 that patient is blind. Per Wife he was struggling to find light switches when up to the bathroom. He also complained of a headache at that time. At baseline post previous stroke he had vision loss but can normally see fairly well and do all of his ADLs.  At this time He cannot see light or  motion in either eye.Otherwise denies chest pain or shortness of breath, no numbness or weakness to the arms or legs. Family is also noting confusion since 0100. NIHSS completed with Neurologist Dr. Cheral Marker yielding 7 for confusion, blindness, and mild extinction. CT scan positive for bleed per Dr. Cheral Marker. Blood Glucose 138. Pt for admit per Neurology to ICU. MRI pending.

## 2018-11-14 NOTE — ED Provider Notes (Signed)
[facility] Emergency Department Provider Note MRN:  [id number]  Arrival date & time: 11/14/18     Chief Complaint   Cerebrovascular Accident   History of Present Illness   Walter Larsen is a 83 y.o. year-old male with a history of stroke, CAD presenting to the ED with chief complaint of CVA.  Last seen normal at 8 PM last night.  Family noticed this morning that patient is blind.  He has had some vision loss in the past from prior strokes, but can normally see fairly well and do all of his ADLs.  He cannot see light, he cannot see motion this morning in either eye.  Otherwise denies chest pain or shortness of breath, no numbness or weakness to the arms or legs.  Family noting he is also mildly confused.  I was unable to obtain an accurate HPI, PMH, or ROS due to the patient's altered mental status.  Level 5 caveat.  Review of Systems  Positive for blindness, confusion.  Patient's Health History    Past Medical History:  Diagnosis Date  . Coronary artery disease    3 stents  . Stroke [facility])     Past Surgical History:  Procedure Laterality Date  . TEE WITHOUT CARDIOVERSION N/A 03/07/2013   Procedure: TRANSESOPHAGEAL ECHOCARDIOGRAM (TEE);  Surgeon: Fay Records, MD;  Location: [facility] ENDOSCOPY;  Service: Cardiovascular;  Laterality: N/A;    Family History  Problem Relation Age of Onset  . Hypertension Mother   . Hypertension Father     Social History   Socioeconomic History  . Marital status: Married    Spouse name: Not on file  . Number of children: 2  . Years of education: 12th  . Highest education level: Not on file  Occupational History  . Occupation: retired  Scientific laboratory technician  . Financial resource strain: Not on file  . Food insecurity    Worry: Not on file    Inability: Not on file  . Transportation needs    Medical: Not on file    Non-medical: Not on file  Tobacco Use  . Smoking status: Never Smoker  . Smokeless tobacco: Never Used  Substance  and Sexual Activity  . Alcohol use: No  . Drug use: No  . Sexual activity: Not on file  Lifestyle  . Physical activity    Days per week: Not on file    Minutes per session: Not on file  . Stress: Not on file  Relationships  . Social Herbalist on phone: Not on file    Gets together: Not on file    Attends religious service: Not on file    Active member of club or organization: Not on file    Attends meetings of clubs or organizations: Not on file    Relationship status: Not on file  . Intimate partner violence    Fear of current or ex partner: Not on file    Emotionally abused: Not on file    Physically abused: Not on file    Forced sexual activity: Not on file  Other Topics Concern  . Not on file  Social History Narrative  . 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, also noted left-sided neglect; normal and symmetric strength and sensation, no facial droop EYES:  eyes equal and reactive ENT/NECK:  no LAD, no JVD CARDIO: Regular rate, well-perfused, normal S1 and S2 PULM:  CTAB no wheezing or rhonchi GI/GU:  normal bowel sounds, non-distended, non-tender MSK/SPINE:  No gross deformities, no edema SKIN:  no rash, atraumatic PSYCH:  Appropriate speech and behavior  Diagnostic and Interventional Summary    EKG Interpretation  Date/Time:  Sunday November 14 2018 06:57:10 EDT Ventricular Rate:  63 PR Interval:  178 QRS Duration: 84 QT Interval:  418 QTC Calculation: 427 R Axis:   80 Text Interpretation:  Normal sinus rhythm Possible Anterior infarct , age undetermined Abnormal ECG Confirmed by Gerlene Fee [phone]) on 11/14/2018 8:17:21 AM      Labs Reviewed  I-STAT  CHEM 8, ED - Abnormal; Notable for the following components:      Result Value   Glucose, Bld 138 (*)    All other components within normal limits  ETHANOL  PROTIME-INR  APTT  CBC  DIFFERENTIAL  COMPREHENSIVE METABOLIC PANEL  RAPID URINE DRUG SCREEN, HOSP PERFORMED  URINALYSIS, ROUTINE W REFLEX MICROSCOPIC    CT HEAD CODE STROKE WO CONTRAST  Final Result      Medications - No data to display   Procedures Critical Care Critical Care Documentation Critical care time provided by me (excluding procedures): 36 minutes  Condition necessitating critical care: Hemorrhagic stroke  Components of critical care management: reviewing of prior records, laboratory and imaging interpretation, frequent re-examination and reassessment of vital signs, initiation of code stroke protocol, discussion with consulting services.    ED Course and Medical Decision Making  I have reviewed the triage vital signs and the nursing notes.  Pertinent labs & imaging results that were available during my care of the patient were reviewed by me and considered in my medical decision making (see below for details).  Acute blindness with left-sided neglect in this 83 year old male with history of prior strokes that caused vision loss in the past.  Last known normal 8 PM last night, less than 24 hours ago, given these and positive findings code stroke initiated.  In route to CT at this time.  CT Noncon reveals occipital hemorrhage, to be admitted to neuro ICU.  Barth Kirks. [place] Small, [place] [email or web address]  Final Clinical Impressions(s) / ED Diagnoses     ICD-10-CM   1. Hemorrhagic stroke [facility])  I61.9     ED Discharge Orders    None         Maudie Flakes, MD 11/14/18 [phone]

## 2018-11-14 NOTE — Progress Notes (Signed)
8/2 @8 :48p Attempted patient 3x today to come down for MRI scan. Patient was very combative and aggressive. On the last call to RN he requested that the exam not be done tonight because patient is still agitated.

## 2018-11-14 NOTE — ED Notes (Addendum)
ED TO INPATIENT HANDOFF REPORT  ED Nurse Name and Phone #: Thurmond Butts [place] Name/Age/Gender Walter Larsen 83 y.o. male Room/Bed: 029C/029C  Code Status   Code Status: Full Code  Home/SNF/Other Home Patient oriented to: N/A Is this baseline? No   Triage Complete: Triage complete  Chief Complaint Blurred vision;confusion  Triage Note Pt arrived with wife and son, unable to answer questions appropriately, does follow commands. Pt c/o nausea and headache. Per wife, pt has hx of stoke in 2014, in which symptoms now are very similar. Pt unable to see per wife at present, has visual deficits from previous stroke. Pt was unable to dress himself this morning which is abnormal. LKW 8pm last night. Grips equal, mild weakness noted to L arm   Allergies Allergies  Allergen Reactions  . Ativan [Lorazepam] Other (See Comments)    confusion    Level of Care/Admitting Diagnosis ED Disposition    ED Disposition Condition Comment   Admit  Hospital Area: [facility] [[id number]]  Level of Care: ICU [6]  Covid Evaluation: Asymptomatic Screening Protocol (No Symptoms)  Diagnosis: ICH (intracerebral hemorrhage) [facility]) [[id number]]  Admitting Physician: Cheral Marker [place]  Attending Physician: Cheral Marker, ERIC Amey.Fanny  Estimated length of stay: 5 - 7 days  Certification:: I certify this patient will need inpatient services for at least 2 midnights  PT Class (Do Not Modify): Inpatient [101]  PT Acc Code (Do Not Modify): Private [1]       B Medical/Surgery History Past Medical History:  Diagnosis Date  . Coronary artery disease    3 stents  . Stroke [facility])    Past Surgical History:  Procedure Laterality Date  . TEE WITHOUT CARDIOVERSION N/A 03/07/2013   Procedure: TRANSESOPHAGEAL ECHOCARDIOGRAM (TEE);  Surgeon: Fay Records, MD;  Location: [facility] ENDOSCOPY;  Service: Cardiovascular;  Laterality: N/A;     A IV Location/Drains/Wounds Patient Lines/Drains/Airways Status    Active Line/Drains/Airways    Name:   Placement date:   Placement time:   Site:   Days:   Peripheral IV 11/14/18 Left Antecubital   11/14/18    0735    Antecubital   less than 1   Wound 03/01/13 Sacrum Mid stage 2 on the mid sacrum   03/01/13    0330    Sacrum   2084          Intake/Output Last 24 hours  Intake/Output Summary (Last 24 hours) at 11/14/2018 1157 Last data filed at 11/14/2018 1023 Gross per 24 hour  Intake 2.36 ml  Output -  Net 2.36 ml    Labs/Imaging Results for orders placed or performed during the hospital encounter of 11/14/18 (from the past 48 hour(s))  Ethanol     Status: None   Collection Time: 11/14/18  7:42 AM  Result Value Ref Range   Alcohol, Ethyl (B) <10 <10 mg/dL    Comment: (NOTE) Lowest detectable limit for serum alcohol is 10 mg/dL. For medical purposes only. Performed at [facility], [place] [address] [place], [place] [address]   Protime-INR     Status: None   Collection Time: 11/14/18  7:42 AM  Result Value Ref Range   Prothrombin Time 14.8 11.4 - 15.2 seconds   INR 1.2 0.8 - 1.2    Comment: (NOTE) INR goal varies based on device and disease states. Performed at [facility], [place] [address] [place], [place] [address]   APTT     Status: None  Collection Time: 11/14/18  7:42 AM  Result Value Ref Range   aPTT 30 24 - 36 seconds    Comment: Performed at [facility] [address] [place] [address]  CBC     Status: None   Collection Time: 11/14/18  7:42 AM  Result Value Ref Range   WBC 9.9 4.0 - 10.5 K/uL   RBC 4.61 4.22 - 5.81 MIL/uL   Hemoglobin 14.6 13.0 - 17.0 g/dL   HCT 44.3 39.0 - 52.0 %   MCV 96.1 80.0 - 100.0 fL   MCH 31.7 26.0 - 34.0 pg   MCHC 33.0 30.0 - 36.0 g/dL   RDW 12.8 11.5 - 15.5 %   Platelets 184 150 - 400 K/uL   nRBC 0.0 0.0 - 0.2 %    Comment: Performed at [facility], [place] [address], [place], [place] [address]  Differential     Status: None   Collection Time: 11/14/18   7:42 AM  Result Value Ref Range   Neutrophils Relative % 77 %   Neutro Abs 7.6 1.7 - 7.7 K/uL   Lymphocytes Relative 13 %   Lymphs Abs 1.3 0.7 - 4.0 K/uL   Monocytes Relative 8 %   Monocytes Absolute 0.8 0.1 - 1.0 K/uL   Eosinophils Relative 2 %   Eosinophils Absolute 0.2 0.0 - 0.5 K/uL   Basophils Relative 0 %   Basophils Absolute 0.0 0.0 - 0.1 K/uL   Immature Granulocytes 0 %   Abs Immature Granulocytes 0.04 0.00 - 0.07 K/uL    Comment: Performed at [facility], [place] [address] [place], [place] [address]  Comprehensive metabolic panel     Status: Abnormal   Collection Time: 11/14/18  7:42 AM  Result Value Ref Range   Sodium 139 135 - 145 mmol/L   Potassium 3.7 3.5 - 5.1 mmol/L   Chloride 103 98 - 111 mmol/L   CO2 21 (L) 22 - 32 mmol/L   Glucose, Bld 137 (H) 70 - 99 mg/dL   BUN 13 8 - 23 mg/dL   Creatinine, Ser 1.17 0.61 - 1.24 mg/dL   Calcium 10.3 8.9 - 10.3 mg/dL   Total Protein 7.6 6.5 - 8.1 g/dL   Albumin 4.4 3.5 - 5.0 g/dL   AST 46 (H) 15 - 41 U/L   ALT 42 0 - 44 U/L   Alkaline Phosphatase 44 38 - 126 U/L   Total Bilirubin 0.9 0.3 - 1.2 mg/dL   GFR calc non Af Amer 59 (L) >60 mL/min   GFR calc Af Amer >60 >60 mL/min   Anion gap 15 5 - 15    Comment: Performed at [facility] [address], [place], [place] [address]  I-stat chem 8, ED     Status: Abnormal   Collection Time: 11/14/18  7:49 AM  Result Value Ref Range   Sodium 139 135 - 145 mmol/L   Potassium 3.6 3.5 - 5.1 mmol/L   Chloride 104 98 - 111 mmol/L   BUN 16 8 - 23 mg/dL   Creatinine, Ser 1.10 0.61 - 1.24 mg/dL   Glucose, Bld 138 (H) 70 - 99 mg/dL   Calcium, Ion 1.25 1.15 - 1.40 mmol/L   TCO2 24 22 - 32 mmol/L   Hemoglobin 15.0 13.0 - 17.0 g/dL   HCT 44.0 39.0 - 52.0 %   Ct Head Code Stroke Wo Contrast  Addendum Date: 11/14/2018   ADDENDUM REPORT: 11/14/2018 08:42 ADDENDUM:  Study discussed by telephone with Dr. Cheral Marker on 11/14/2018 at 0827 hours. Electronically Signed   By: Genevie Ann M.D.    On: 11/14/2018 08:42   Result Date: 11/14/2018 CLINICAL DATA:  Code stroke. 83 year old male with sudden onset vision loss in both eyes. EXAM: CT HEAD WITHOUT CONTRAST TECHNIQUE: Contiguous axial images were obtained from the base of the skull through the vertex without intravenous contrast. COMPARISON:  Brain MRI 03/05/2013. [facility] Head CT 09/23/2013. FINDINGS: Brain: Multifocal acute intra-axial hemorrhages in both cerebral hemispheres. Blood volume is greater on the right, where there is a confluent hemorrhage occupying the posterior right temporal lobe encompassing 46 x 48 by 45 millimeters (AP by transverse by CC). Smaller multifocal acute hemorrhages are present in an area of chronic right occipital pole encephalomalacia, the larger is 21 millimeters diameter on series 3, image 18. In the contralateral left lateral occipital lobe there is heterogeneous hyperdense hemorrhage encompassing 36 by 22 by 42 millimeters (AP by transverse by CC). There is associated parenchymal edema at each acute hemorrhage site. Underlying chronic right parietal and occipital lobe encephalomalacia. Mass effect on the occipital and temporal horns, but no ventriculomegaly. No intraventricular extension of blood. No midline shift. Basilar cisterns remain normal. Trace if any extra-axial extension of blood (perhaps some subarachnoid along the right sylvian fissure). On the 2014 brain MRI hemosiderin was present throughout the posterior right temporal and anterior right occipital lobes. However, there is also a subtle 9-10 millimeter hyperdense mass in the posterior left cingulate on series 3, image 22, and it is unclear whether this might be a small area of acute or chronic hemorrhage. The brainstem and cerebellum are spared. There is Patchy and confluent bilateral cerebral white matter hypodensity which is chronic. No superimposed acute cortically based infarct identified. Vascular: Extensive Calcified atherosclerosis at  the skull base. No suspicious intracranial vascular hyperdensity. Skull: No acute or suspicious osseous lesion identified. Sinuses/Orbits: Visualized paranasal sinuses and mastoids are stable and well pneumatized. Other: Chronic postoperative changes to both globes. Bilateral orbits soft tissues appears stable and negative. Visualized scalp soft tissues are within normal limits. ASPECTS [facility] Stroke Program Early CT Score) Total score (0-10 with 10 being normal): Not applicable, acute hemorrhage. IMPRESSION: 1. Multifocal acute intra-axial hemorrhages in both posterior cerebral hemispheres, larger on the right (where collective blood volume is estimated at 55 mL). Underlying posterior right hemisphere encephalomalacia from a 2014 intra-axial hemorrhage. Additional subtle 9-10 mm hyperdense mass-like area in the posterior left cingulate. 2. Associated cerebral edema, but no midline shift or loss of basilar cisterns. No intraventricular extension. Trace extension into the subarachnoid space suspected. 3. 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 Brain MRI without and with contrast recommended when feasible to further characterize. 4. Underlying chronic white matter disease. Neurology Dr. Cheral Marker was sent a text page via the [facility] messaging system regarding critical findings on this exam at 8:02 am on 11/14/2018. Electronically Signed: By: Genevie Ann M.D. On: 11/14/2018 08:06    Pending Labs Unresulted Labs (From admission, onward)    Start     Ordered   11/14/18 0729  Urine rapid drug screen (hosp performed)  ONCE - STAT,   STAT     11/14/18 0728   11/14/18 0729  Urinalysis, Routine w reflex microscopic  ONCE - STAT,   STAT     11/14/18 0728          Vitals/Pain Today's Vitals   11/14/18 0900  11/14/18 1045 11/14/18 1100 11/14/18 1115  BP: (!) 163/72 (!) 156/68 (!) 148/77 (!) 147/75  Pulse:  65    Resp: 17 19 20 17   Temp:      TempSrc:       SpO2:  96%      Isolation Precautions No active isolations  Medications Medications   stroke: mapping our early stages of recovery book (has no administration in time range)  acetaminophen (TYLENOL) tablet 650 mg (has no administration in time range)    Or  acetaminophen (TYLENOL) solution 650 mg (has no administration in time range)    Or  acetaminophen (TYLENOL) suppository 650 mg (has no administration in time range)  senna-docusate (Senokot-S) tablet 1 tablet (has no administration in time range)  pantoprazole (PROTONIX) injection 40 mg (has no administration in time range)  labetalol (NORMODYNE) injection 20 mg (has no administration in time range)    And  clevidipine (CLEVIPREX) infusion 0.5 mg/mL (has no administration in time range)  fenofibrate tablet 160 mg (has no administration in time range)  metoprolol succinate (TOPROL-XL) 24 hr tablet 50 mg (has no administration in time range)  tamsulosin (FLOMAX) capsule 0.4 mg (has no administration in time range)    Mobility walks with person assist High fall risk   Focused Assessments    R Recommendations: See Admitting Provider Note  Report given to: My, 4N RN  Additional Notes:

## 2018-11-14 NOTE — Progress Notes (Signed)
Patient arrived to 4N31 from ED.  Patient extremely agitated and trying to get out of bed.  MRI could not be complete.  Neurology paged multiple time but has not page back.  Stroke team MD on unit came to assess patient and paged admitting neurology.  Verbal order received from stroke MD.  RN has try calling patient's family to help deescalate patient's agitation.  RN attempting to take Q1 blood pressure but patient is extremely uncooperative.   RN to continue to monitor.

## 2018-11-15 ENCOUNTER — Inpatient Hospital Stay (HOSPITAL_COMMUNITY): Payer: Medicare HMO

## 2018-11-15 DIAGNOSIS — G936 Cerebral edema: Secondary | ICD-10-CM

## 2018-11-15 DIAGNOSIS — I619 Nontraumatic intracerebral hemorrhage, unspecified: Secondary | ICD-10-CM

## 2018-11-15 DIAGNOSIS — I639 Cerebral infarction, unspecified: Secondary | ICD-10-CM

## 2018-11-15 LAB — HEMOGLOBIN A1C
Hgb A1c MFr Bld: 5.8 % — ABNORMAL HIGH (ref 4.8–5.6)
Mean Plasma Glucose: 119.76 mg/dL

## 2018-11-15 LAB — ECHOCARDIOGRAM COMPLETE
Height: 72 in
Weight: 2994.73 oz

## 2018-11-15 LAB — LIPID PANEL
Cholesterol: 170 mg/dL (ref 0–200)
HDL: 40 mg/dL — ABNORMAL LOW (ref >40)
LDL Cholesterol: 96 mg/dL (ref 0–99)
Total CHOL/HDL Ratio: 4.3 RATIO
Triglycerides: 168 mg/dL — ABNORMAL HIGH (ref <150)
VLDL: 34 mg/dL (ref 0–40)

## 2018-11-15 MED ORDER — HYDRALAZINE HCL 20 MG/ML IJ SOLN
20.0000 mg | Freq: Four times a day (QID) | INTRAMUSCULAR | Status: DC | PRN
  Administered 2018-11-15 – 2018-11-16 (×3): 20 mg via INTRAVENOUS
  Filled 2018-11-15 (×5): qty 1

## 2018-11-15 MED ORDER — SODIUM CHLORIDE 0.9 % IV SOLN
INTRAVENOUS | Status: DC
  Administered 2018-11-15 – 2018-11-17 (×3): via INTRAVENOUS

## 2018-11-15 NOTE — Progress Notes (Signed)
  Speech Language Pathology Treatment: Dysphagia  Patient Details Name: Walter Larsen MRN: [id number] DOB: 1935-05-31 Today's Date: 11/15/2018 Time: 7680-8811 SLP Time Calculation (min) (ACUTE ONLY): 12 min  Assessment / Plan / Recommendation Clinical Impression  Mr Baade was able to attend, remain alert with improved participation this afternoon; eyes remain mostly closed secondary to visual disturbance. Orally controlled, masticated, transited and performed lingual sweep independently to remove cracker from dentition. No s/s aspiration present. He tends to have an effortful swallow, denies odonophagia; suspect may be due to increased sensory input given MD suspicion of cortical blindness. Given his alertness/endurance is not at baseline, will initiate Dys 2 texture, thin liquids, crush meds, full supervision and continued ST.    HPI: Walter Larsen is an 83 y.o. male with a history of stroke 2014 with residual visual deficits, CAD s/p stenting x 3, presenting with acute onset of bilateral blindness. CT Multifocal acute intra-axial hemorrhages in both posterior cerebral hemispheres, larger on the right. Underlying posterior right hemisphere encephalomalacia from a 2014 intra-axial hemorrhage. Additional subtle 9-10 mm hyperdense mass-like area in the posterior left cingulate.      SLP Plan  Continue with current plan of care       Recommendations  Diet recommendations: Dysphagia 2 (fine chop);Thin liquid Liquids provided via: Cup;Straw Medication Administration: Crushed with puree Supervision: Staff to assist with self feeding;Full supervision/cueing for compensatory strategies Compensations: Minimize environmental distractions;Slow rate;Small sips/bites;Lingual sweep for clearance of pocketing Postural Changes and/or Swallow Maneuvers: Seated upright 90 degrees                Oral Care Recommendations: Oral care BID Follow up Recommendations: Inpatient Rehab SLP Visit Diagnosis:  Dysphagia, unspecified (R13.10) Plan: Continue with current plan of care       GO                Houston Siren 11/15/2018, 4:29 PM

## 2018-11-15 NOTE — Evaluation (Signed)
Physical Therapy Evaluation Patient Details Name: Walter Larsen MRN: [id number] DOB: 1936-01-25 Today's Date: 11/15/2018   History of Present Illness  Patient is a 83 y/o male who presents with vision loss. Head CT-reveals multifocal acute intracerebral hemorrhages bilaterally, both posterior cerebral hemispheres, larger on the right. PMH includes stroke and CAD.  Clinical Impression  Patient presents with blindness, decreased arousal, impaired attention, impaired balance and impaired mobility s/p above. Pt with cognitive deficits relating to awareness, safety, problem solving, memory and orientation. Eyes remained closed most of session. Requires Mod A of 2 for bed mobility and standing which quickly turned to Total A with right/posterior lean requiring need to return to supine. Pt reports being independent PTA and lives with his wife. Has hx of prior stroke with visual deficits but per chart performing own ADLs. PLOF was taken from previous admission as pt not able to provide info. Would benefit from CIR to maximize independence and mobility prior to return home.Will follow acutely.     Follow Up Recommendations CIR;Supervision for mobility/OOB    Equipment Recommendations  Other (comment)(defer)    Recommendations for Other Services       Precautions / Restrictions Precautions Precautions: Fall Precaution Comments: SBP <140; cortically blind Restrictions Weight Bearing Restrictions: No      Mobility  Bed Mobility Overal bed mobility: Needs Assistance Bed Mobility: Supine to Sit;Sit to Supine Rolling: Min assist   Supine to sit: Mod assist;+2 for physical assistance;HOB elevated Sit to supine: Mod assist;+2 for physical assistance;HOB elevated   General bed mobility comments: Assist with LEs and trunk to get to EOB. Able to initiate movement. Needed assist to find pillow to return to supine. Able to roll to left multiple times to adjust leads.  Transfers Overall transfer  level: Needs assistance Equipment used: 2 person hand held assist Transfers: Sit to/from Stand Sit to Stand: Mod assist;Max assist;+2 physical assistance         General transfer comment: Assist of 2 to stand from EOB with therapist blocking bil feet to prevent from sliding. First attempt, Mod A of 2 with good initiation for forward translation digressing to Max A of 2 with no initiation on multiple attempts. Difficulty following commands and became a "rag doll" leaning right. Posterior bias.  Ambulation/Gait Ambulation/Gait assistance: Max assist;+2 physical assistance Gait Distance (Feet): 2 Feet Assistive device: 2 person hand held assist       General Gait Details: Able to take a few steps along side bed with Max of 2 for balance/safety due to posterior bias and cognition.  Stairs            Wheelchair Mobility    Modified Rankin (Stroke Patients Only) Modified Rankin (Stroke Patients Only) Pre-Morbid Rankin Score: Moderate disability Modified Rankin: Moderately severe disability     Balance Overall balance assessment: Needs assistance Sitting-balance support: Feet supported;No upper extremity supported Sitting balance-Leahy Scale: Poor Sitting balance - Comments: initially pt requires Min A-Min guard which quickly turned to Max A due to posterior/right lateral lean   Standing balance support: During functional activity Standing balance-Leahy Scale: Poor Standing balance comment: External support needed, posterior bias.                             Pertinent Vitals/Pain Pain Assessment: Faces Faces Pain Scale: No hurt    Home Living  Additional Comments: All info above was taken from prior admission as pt not able to provide info this session    Prior Function Level of Independence: Independent               Hand Dominance   Dominant Hand: Right    Extremity/Trunk Assessment   Upper Extremity  Assessment Upper Extremity Assessment: Defer to OT evaluation    Lower Extremity Assessment Lower Extremity Assessment: Generalized weakness;Difficult to assess due to impaired cognition    Cervical / Trunk Assessment Cervical / Trunk Assessment: Kyphotic  Communication      Cognition Arousal/Alertness: Lethargic Behavior During Therapy: Restless Overall Cognitive Status: Difficult to assess Area of Impairment: Orientation;Attention;Memory;Following commands;Awareness;Problem solving                 Orientation Level: Disoriented to;Place;Time;Situation Current Attention Level: Focused Memory: Decreased short-term memory Following Commands: Follows one step commands inconsistently;Follows one step commands with increased time(repetition and multimodal cues)   Awareness: Intellectual Problem Solving: Requires verbal cues;Requires tactile cues;Difficulty sequencing;Slow processing General Comments: "2001" "January" "I am sitting here talking to you." Eyes remained closed for most of session. Not sure he has awareness he is blind.      General Comments General comments (skin integrity, edema, etc.): VSS throughout.    Exercises     Assessment/Plan    PT Assessment Patient needs continued PT services  PT Problem List Decreased strength;Decreased mobility;Decreased safety awareness;Decreased cognition;Decreased balance;Decreased activity tolerance       PT Treatment Interventions Therapeutic activities;DME instruction;Gait training;Therapeutic exercise;Patient/family education;Balance training;Neuromuscular re-education;Stair training    PT Goals (Current goals can be found in the Care Plan section)  Acute Rehab PT Goals Patient Stated Goal: none stated as pt unable PT Goal Formulation: Patient unable to participate in goal setting Time For Goal Achievement: 11/29/18 Potential to Achieve Goals: Fair    Frequency Min 4X/week   Barriers to discharge         Co-evaluation               AM-PAC PT "6 Clicks" Mobility  Outcome Measure Help needed turning from your back to your side while in a flat bed without using bedrails?: A Lot Help needed moving from lying on your back to sitting on the side of a flat bed without using bedrails?: A Lot Help needed moving to and from a bed to a chair (including a wheelchair)?: A Lot Help needed standing up from a chair using your arms (e.g., wheelchair or bedside chair)?: A Lot Help needed to walk in hospital room?: A Lot Help needed climbing 3-5 steps with a railing? : Total 6 Click Score: 11    End of Session Equipment Utilized During Treatment: Gait belt Activity Tolerance: Patient limited by lethargy Patient left: in bed;with call bell/phone within reach;with bed alarm set;with restraints reapplied Nurse Communication: Mobility status PT Visit Diagnosis: Unsteadiness on feet (R26.81);Difficulty in walking, not elsewhere classified (R26.2)    Time: 5784-6962 PT Time Calculation (min) (ACUTE ONLY): 20 min   Charges:   PT Evaluation $PT Eval Moderate Complexity: 1 Mod           Wray Kearns, PT, DPT Acute Rehabilitation Services Pager [phone] Office [place] 11/15/2018, 12:55 PM

## 2018-11-15 NOTE — Progress Notes (Signed)
  Echocardiogram 2D Echocardiogram has been performed.  Walter Larsen 11/15/2018, 12:32 PM

## 2018-11-15 NOTE — Progress Notes (Signed)
OT Cancellation Note  Patient Details Name: Walter Larsen MRN: [id number] DOB: 12/06/1935   Cancelled Treatment:    Reason Eval/Treat Not Completed: Active bedrest order Will await increase in activity orders prior to OT evaluation. Will follow.  [place], OTR/L Acute Rehab Services Pager [phone] Office [phone]      Almon Register 11/15/2018, 7:59 AM

## 2018-11-15 NOTE — Progress Notes (Signed)
PT Cancellation Note  Patient Details Name: Walter Larsen MRN: [id number] DOB: 1935/10/03   Cancelled Treatment:    Reason Eval/Treat Not Completed: Active bedrest order Will await increase in activity orders prior to PT evaluation. Will follow.   Marguarite Arbour A [name] 11/15/2018, 7:36 AM Wray Kearns, PT, DPT Acute Rehabilitation Services Pager [phone] Office [phone]

## 2018-11-15 NOTE — Progress Notes (Signed)
New S BP goal < 160 per Dr. Leonie Man, hydralazine ordered

## 2018-11-15 NOTE — Progress Notes (Signed)
STROKE TEAM PROGRESS NOTE   INTERVAL HISTORY  Patient was combative and aggressive during MRI scan last night so test was not completed. No acute events overnight. Continues to have visual deficits. Sleepy and less responsive this AM. Patient failed swallow study this AM so SLP recommending NPO except with meds. Inpatient rehab has been recommended for. MRI brain, TTE, CXR, U/S carotid and repeat CT head planned for today.  Blood pressure adequately controlled.  Vitals:   11/15/18 1445 11/15/18 1500 11/15/18 1515 11/15/18 1530  BP: 139/77 (!) 148/69 (!) 152/78 (!) 151/77  Pulse: 69     Resp: 20 10 17 20   Temp:      TempSrc:      SpO2: 98%     Weight:      Height:        CBC:  Recent Labs  Lab 11/14/18 0742 11/14/18 0749  WBC 9.9  --   NEUTROABS 7.6  --   HGB 14.6 15.0  HCT 44.3 44.0  MCV 96.1  --   PLT 184  --     Basic Metabolic Panel:  Recent Labs  Lab 11/14/18 0742 11/14/18 0749  NA 139 139  K 3.7 3.6  CL 103 104  CO2 21*  --   GLUCOSE 137* 138*  BUN 13 16  CREATININE 1.17 1.10  CALCIUM 10.3  --    Lipid Panel:     Component Value Date/Time   CHOL 170 11/14/2018 0742   TRIG 168 (H) 11/14/2018 0742   HDL 40 (L) 11/14/2018 0742   CHOLHDL 4.3 11/14/2018 0742   VLDL 34 11/14/2018 0742   LDLCALC 96 11/14/2018 0742   HgbA1c:  Lab Results  Component Value Date   HGBA1C 5.8 (H) 11/14/2018   Urine Drug Screen:     Component Value Date/Time   LABOPIA NONE DETECTED 11/14/2018 1220   COCAINSCRNUR NONE DETECTED 11/14/2018 1220   LABBENZ NONE DETECTED 11/14/2018 1220   AMPHETMU NONE DETECTED 11/14/2018 1220   THCU NONE DETECTED 11/14/2018 1220   LABBARB NONE DETECTED 11/14/2018 1220    Alcohol Level     Component Value Date/Time   ETH <10 11/14/2018 0742    IMAGING CT head wo contrast 11/14/2018 IMPRESSION: 1. Multifocal acute intra-axial hemorrhages in both posterior cerebral hemispheres, larger on the right (where collective blood volume is  estimated at 55 mL). Underlying posterior right hemisphere encephalomalacia from a 2014 intra-axial hemorrhage. Additional subtle 9-10 mm hyperdense mass-like area in the posterior left cingulate. 2. Associated cerebral edema, but no midline shift or loss of basilar cisterns. No intraventricular extension. Trace extension into the subarachnoid space suspected. 3. 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 4. Underlying chronic white matter disease.  MR Brain wo contrast 11/15/2018 IMPRESSION: 1. Incomplete examination. 2. Parenchymal hemorrhages posteriorly in both cerebral hemispheres with associated small volume subarachnoid hemorrhage as described on today's CT. 3. Multiple additional chronic hemorrhages which may reflect cerebral amyloid angiopathy. 4. Punctate foci of diffusion abnormality in the left frontal lobe and left cerebellum, possibly subacute infarcts.  CT head wo contrast 11/15/2018 IMPRESSION: 1. There is redemonstrated multifocal acute parenchymal hemorrhage. A large hematoma of the right frontotemporoparietal junction is enlarged, measuring 5.4 x 3.7 cm, previously 3.8 x 3.7 cm when measured similarly (series 3, image 17). 2. An adjacent focus of hemorrhage in the right occipital lobe is not significantly changed measuring 2.2 x 1.3 cm (series 3, image 16). Hemorrhage of the left  parietooccipital lobe is not significantly changed measuring 3.4 x 2.1 cm (series 3, image 13). A very small focus of hemorrhage seen on prior examination in the medial posterior left frontal lobe is not well appreciated (series 3, image 26). Distribution of multifocal parenchymal hemorrhage again suggests amyloid angiopathy. 3. There is minimal bilateral subarachnoid hemorrhage without substantial subdural collection. There is mild mass effect on the right lateral ventricle without significant midline shift. 4. Underlying small-vessel white matter  disease. Unchanged right occipital encephalomalacia.  CXR 11/14/2018 IMPRESSION: RIGHT basilar atelectasis. Atherosclerotic calcification aorta.  ECHO 11/15/2018 IMPRESSION: EF 55-60%. Increased LV wall thickness with impaired relaxation  Carotid U/S 11/15/2018 IMPRESSION: Right Carotid: Velocities in the right ICA are consistent with a 1-39% stenosis. Left Carotid: Velocities in the left ICA are consistent with a 1-39% stenosis. Vertebrals: Bilateral vertebral arteries demonstrate antegrade flow.  PHYSICAL EXAM Gen: NAD, comfortably in bed, not following commands.  On Precedex drip HEENT-  Walter Larsen/AT. Neck supple  Lungs - CTAB, no increased WOB CV: RRR, no murmurs, gallops or rubs Abdomen - NT/ND Extremities - Warm, well perfused. No edema. Distal pulses 2+ Neurologic Examination: Mental Status: Sleepy.  Can barely be aroused.  Speech is slightly dysarthric but clear.  Follows barely some midline commands.  Does not follow motor commands.  Cranial Nerves: .Patient does not blink to threat on either side.  Fundi could not be visualized.  Does not seem to able to count fingers.  Not cooperative for detailed exam.  No facial weakness.  Tongue midline.   Motor: Normal tone throughout; no atrophy noted.  Able to move all 4 extremities against gravity without focal weakness. Deep Tendon Reflexes:  Unremarkable x 4 Plantars: Right: downgoing Left: downgoing  Gait: Deferred   ASSESSMENT/PLAN [name] is a 83 y.o. male with PMH of stroke 2014 with residual visual deficits, CAD s/p stenting x3, who presented with acute onset of bilateral blindness and found to have multifocal acute intra-axial hemorrhages in both posterior cerebral hemispheres, larger on the right on head CT. Underlying posterior right hemisphere encephalomalacia from a 2014 intra-axial hemorrhage. Additional subtle 9-10 mm hyperdense mass-like area in the posterior left cingulate.    Stroke: Bilateral posterior cerebral  hemisphere hemorrhage likely secondary to cerebral amyloid angiopathy.  Doubt metastases. CT head wo contrast   Code Stroke CT head Multifocal acute intra-axial hemorrhages in both posterior cerebral hemispheres, larger on the right (where collective blood volume is estimated at 55 mL).  MRI Brain with parenchymal hemorrhages posteriorly in both cerebral hemispheres with associated small volume subarachnoid hemorrhage as described on today's CT.  Carotid Doppler shows 1-39% stenosis in bilateral ICA w/ antegrade flow in bilateral vertebral arteries  2D Echo shows EF 55-60%. Increased LV wall thickness with impaired relaxation  LDL 96  HgbA1c 5.8  SCDs for VTE prophylaxis  Diet: Dysphasia 2 - Thin liquids  No antithrombotic prior to admission, now on No antithrombotic. On SCDs  Therapy recommendations:  CIR  Disposition:  Pending  Hypertension  Home meds: Metoprolol 50 mg daily  Unstable  Start hydralazine 20 mg PRN q6h for sBP > 160 . Long-term BP goal normotensive  Hyperlipidemia  Home meds: Crestor 40 mg daily, fenofibrate 160 mg resumed in hospital  LDL 96, goal < 70  Continue statin at discharge  Diabetes type II Controlled  No home meds  HgbA1c 5.8, goal < 7.0  SSI not indicated at this time  Daily CBGs  Other Stroke Risk Factors  Advanced age  Hx stroke in 2014, posterior right hemisphere encephalomalacia intra-axial hemorrhage  Coronary artery disease  Other Active Problems    Hospital day # 1  Plan:  - CXR neg for lung malignancy - Wean off sedation - Start hydralazine prn for sBP > 160   Linwood Dibbles, MS4   I have personally obtained history,examined this patient, reviewed notes, independently viewed imaging studies, participated in medical decision making and plan of care.ROS completed by me personally and pertinent positives fully documented  I have made any additions or clarifications directly to the above note.  Patient  presented with by cerebral mostly posterior parenchymal hemorrhages etiology indeterminate but likely amyloid angiopathy given advanced age and multiple posterior hemorrhages.  Blood pressure has been slightly elevated.  Maintain strict blood pressure control with systolic blood pressure goal below 160.  Use PRN hydralazine.  Check MRI scan of the brain as patient seems quite sedated now.  Check echocardiogram, lipid profile and hemoglobin A1c.  If MRI cannot be done we will repeat CT scan.  I had a long discussion with the patient's son over the phone about his prognosis, plan for evaluation treatment and answered questions.  Check swallow eval and if able to swallow start diet and home medications. This patient is critically ill and at significant risk of neurological worsening, death and care requires constant monitoring of vital signs, hemodynamics,respiratory and cardiac monitoring, extensive review of multiple databases, frequent neurological assessment, discussion with family, other specialists and medical decision making of high complexity.I have made any additions or clarifications directly to the above note.This critical care time does not reflect procedure time, or teaching time or supervisory time of PA/NP/Med Resident, medical student etc but could involve care discussion time.  I spent 30 minutes of neurocritical care time  in the care of  this patient. Antony Contras, MD     Antony Contras, MD Medical Director [place] Pager: [phone] 11/15/2018 5:06 PM  To contact Stroke Continuity provider, please refer to [email or web address] After hours, contact General Neurology

## 2018-11-15 NOTE — Evaluation (Signed)
Clinical/Bedside Swallow Evaluation Patient Details  Name: Walter Larsen MRN: [id number] Date of Birth: 11-04-1935  Today's Date: 11/15/2018 Time: SLP Start Time (ACUTE ONLY): 0856 SLP Stop Time (ACUTE ONLY): 0915 SLP Time Calculation (min) (ACUTE ONLY): 19 min  Past Medical History:  Past Medical History:  Diagnosis Date  . Coronary artery disease    3 stents  . Stroke [facility])    Past Surgical History:  Past Surgical History:  Procedure Laterality Date  . TEE WITHOUT CARDIOVERSION N/A 03/07/2013   Procedure: TRANSESOPHAGEAL ECHOCARDIOGRAM (TEE);  Surgeon: Fay Records, MD;  Location: [facility] ENDOSCOPY;  Service: Cardiovascular;  Laterality: N/A;   HPI:  Walter Larsen is an 83 y.o. male with a history of stroke 2014 with residual visual deficits, CAD s/p stenting x 3, presenting with acute onset of bilateral blindness. CT Multifocal acute intra-axial hemorrhages in both posterior cerebral hemispheres, larger on the right. Underlying posterior right hemisphere encephalomalacia from a 2014 intra-axial hemorrhage. Additional subtle 9-10 mm hyperdense mass-like area in the posterior left cingulate.   Assessment / Plan / Recommendation Clinical Impression  Pt presents with swallow impairments consistent with cognitive deficits and acute stroke. Alertness optimal for this evaluation but not recommended other than crushed meds (puree) when alert. Suspect incoordination of swallow/respiration sequence. During each swallow pt became restless, extended trunk almost grimacing but denies odonophagia. Slight delayed throat clear otherwise no overt s/s aspiration. MD suspects cortical blindness which impacts his cognition. SLP is recommending NPO except meds. SLP plans to return later today for improvements in awareness/alertness.    SLP Visit Diagnosis: Dysphagia, unspecified (R13.10)    Aspiration Risk  Mild aspiration risk    Diet Recommendation NPO except meds   Medication Administration: Crushed with  puree Supervision: Full supervision/cueing for compensatory strategies;Staff to assist with self feeding Compensations: Minimize environmental distractions;Slow rate;Small sips/bites Postural Changes: Seated upright at 90 degrees    Other  Recommendations Oral Care Recommendations: Oral care QID   Follow up Recommendations Inpatient Rehab      Frequency and Duration min 2x/week  2 weeks       Prognosis Prognosis for Safe Diet Advancement: Good Barriers to Reach Goals: Cognitive deficits      Swallow Study   General HPI: Walter Larsen is an 83 y.o. male with a history of stroke 2014 with residual visual deficits, CAD s/p stenting x 3, presenting with acute onset of bilateral blindness. CT Multifocal acute intra-axial hemorrhages in both posterior cerebral hemispheres, larger on the right. Underlying posterior right hemisphere encephalomalacia from a 2014 intra-axial hemorrhage. Additional subtle 9-10 mm hyperdense mass-like area in the posterior left cingulate. Type of Study: Bedside Swallow Evaluation Previous Swallow Assessment: (none) Diet Prior to this Study: NPO Temperature Spikes Noted: Yes Respiratory Status: Room air History of Recent Intubation: No Behavior/Cognition: Requires cueing;Confused;Distractible;Lethargic/Drowsy Oral Cavity Assessment: Dry Oral Care Completed by SLP: Yes Oral Cavity - Dentition: Adequate natural dentition Vision: Impaired for self-feeding Self-Feeding Abilities: Needs assist;Needs set up Patient Positioning: Upright in bed Baseline Vocal Quality: Other (comment)(gravely ) Volitional Cough: Cognitively unable to elicit Volitional Swallow: Unable to elicit    Oral/Motor/Sensory Function Overall Oral Motor/Sensory Function: Other (comment)(diffficult to assess fully d/t cognition)   Ice Chips Ice chips: Not tested   Thin Liquid Thin Liquid: Impaired Presentation: Cup;Straw Oral Phase Impairments: Reduced labial seal Oral Phase Functional  Implications: Other (comment)(min anterior spill) Pharyngeal  Phase Impairments: Suspected delayed Swallow;Other (comments);Throat Clearing - Delayed;Multiple swallows(trunk extension, decr resp/swallow coordin)  Nectar Thick Nectar Thick Liquid: Not tested   Honey Thick Honey Thick Liquid: Not tested   Puree Puree: Impaired Presentation: Spoon Oral Phase Impairments: Reduced labial seal;Poor awareness of bolus Pharyngeal Phase Impairments: Multiple swallows   Solid     Solid: Not tested      Houston Siren 11/15/2018,9:40 AM   Orbie Pyo Colvin Caroli.Ed Risk analyst [phone] Office [phone]

## 2018-11-15 NOTE — Evaluation (Signed)
Occupational Therapy Evaluation Patient Details Name: Walter Larsen MRN: [id number] DOB: 18-Apr-1935 Today's Date: 11/15/2018    History of Present Illness Patient is a 83 y/o male who presents with vision loss. Head CT-reveals multifocal acute intracerebral hemorrhages bilaterally, both posterior cerebral hemispheres, larger on the right. PMH includes stroke and CAD.   Clinical Impression   This 83 yo male admitted with above presents to acute OT with decreased mobility, decreased command following, decreased awareness of deficits, new total blindness all affecting his safety and independence with basic ADLs. He will benefit from acute OT with follow up OT on CIR.    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 Able to initiate movement. Needed assist to find pillow to return to supine. Able to roll to left multiple times to adjust leads.  Transfers Overall transfer level: Needs assistance Equipment used: 2 person hand held assist Transfers: Sit to/from Stand Sit to Stand: Mod assist;Max assist;+2 physical assistance         General transfer comment: Assist of 2 to stand from EOB with therapist blocking bil feet to prevent from sliding. First attempt, Mod A of 2 with good initiation for forward translation digressing to Max A of 2 with no initiation on multiple attempts. Difficulty following commands and became a "rag doll"  leaning right. Posterior bias.    Balance Overall balance assessment: Needs assistance Sitting-balance support: Feet supported;No upper extremity supported Sitting balance-Leahy Scale: Poor Sitting balance - Comments: initially pt requires Min A-Min guard which quickly turned to Max A due to posterior/right lateral lean   Standing balance support: During functional activity Standing balance-Leahy Scale: Poor Standing balance comment: External support needed, posterior bias.                           ADL either performed or assessed with clinical judgement   ADL Overall ADL's : Needs assistance/impaired                                       General ADL Comments: total A for all basic ADLs at present     Vision   Additional Comments: Pt does not blink to threat either eye, tends to keep eyes shut            Pertinent Vitals/Pain Pain Assessment: Faces Faces Pain Scale: No hurt     Hand Dominance Right   Extremity/Trunk Assessment Upper Extremity Assessment Upper Extremity Assessment: Generalized weakness   Lower Extremity Assessment Lower Extremity Assessment: Generalized weakness;Difficult to assess due to impaired cognition   Cervical / Trunk Assessment Cervical / Trunk Assessment: Kyphotic   Communication  Hard to understand   Cognition Arousal/Alertness: Lethargic Behavior During Therapy: Restless Overall Cognitive Status: Difficult to assess Area of Impairment: Orientation;Attention;Memory;Following commands;Awareness;Problem solving  Orientation Level: Disoriented to;Place;Time;Situation Current Attention Level: Focused Memory: Decreased short-term memory Following Commands: Follows one step commands inconsistently;Follows one step commands with increased time(repetition and multimodal cues)   Awareness: Intellectual Problem Solving: Requires verbal cues;Requires tactile cues;Difficulty sequencing;Slow  processing General Comments: "2001" "January" "I am sitting here talking to you." Eyes remained closed for most of session. Not sure he has awareness he is blind.   General Comments  VSS throughout.               Prior Functioning/Environment Level of Independence: Independent                 OT Problem List: Decreased strength;Decreased activity tolerance;Impaired balance (sitting and/or standing);Impaired vision/perception;Decreased safety awareness;Decreased cognition      OT Treatment/Interventions: Self-care/ADL training;Therapeutic activities;Visual/perceptual remediation/compensation;DME and/or AE instruction;Patient/family education;Balance training    OT Goals(Current goals can be found in the care plan section) Acute Rehab OT Goals Patient Stated Goal: to lay back down OT Goal Formulation: Patient unable to participate in goal setting Time For Goal Achievement: 11/29/18 Potential to Achieve Goals: Fair  OT Frequency: Min 2X/week           Co-evaluation PT/OT/SLP Co-Evaluation/Treatment: Yes Reason for Co-Treatment: Necessary to address cognition/behavior during functional activity;For patient/therapist safety;To address functional/ADL transfers PT goals addressed during session: Mobility/safety with mobility;Balance OT goals addressed during session: ADL's and self-care;Strengthening/ROM      AM-PAC OT "6 Clicks" Daily Activity     Outcome Measure Help from another person eating meals?: Total Help from another person taking care of personal grooming?: Total Help from another person toileting, which includes using toliet, bedpan, or urinal?: Total Help from another person bathing (including washing, rinsing, drying)?: Total Help from another person to put on and taking off regular upper body clothing?: Total Help from another person to put on and taking off regular lower body clothing?: Total 6 Click Score: 6   End of Session Equipment Utilized During  Treatment: Gait belt Nurse Communication: Mobility status  Activity Tolerance: Patient limited by lethargy Patient left: in bed;with call bell/phone within reach;with bed alarm set  OT Visit Diagnosis: Unsteadiness on feet (R26.81);Other abnormalities of gait and mobility (R26.89);Muscle weakness (generalized) (M62.81);Other symptoms and signs involving cognitive function                Time: 1610-9604 OT Time Calculation (min): 20 min Charges:  OT General Charges $OT Visit: 1 Visit OT Evaluation $OT Eval Moderate Complexity: 1 Mod  [place] Circle, OTR/L Acute [employer] Pager [phone] Office [phone]     Almon Register 11/15/2018, 1:08 PM

## 2018-11-15 NOTE — Progress Notes (Signed)
Rehab Admissions Coordinator Note:  Per PT, OT, and SLP, this patient was screened by Jhonnie Garner for appropriateness for an Inpatient Acute Rehab Consult.  At this time, we are recommending Inpatient Rehab consult. AC will contact MD to request order.   Jhonnie Garner 11/15/2018, 2:19 PM  I can be reached at [phone].

## 2018-11-15 NOTE — Progress Notes (Signed)
Carotid duplex has been completed.   Preliminary results in CV Proc.   Abram Sander 11/15/2018 3:57 PM

## 2018-11-16 ENCOUNTER — Inpatient Hospital Stay (HOSPITAL_COMMUNITY): Payer: Medicare HMO

## 2018-11-16 DIAGNOSIS — G936 Cerebral edema: Secondary | ICD-10-CM

## 2018-11-16 LAB — TRIGLYCERIDES: Triglycerides: 128 mg/dL (ref <150)

## 2018-11-16 MED ORDER — PANTOPRAZOLE SODIUM 40 MG PO TBEC
40.0000 mg | DELAYED_RELEASE_TABLET | Freq: Every day | ORAL | Status: DC
  Administered 2018-11-16 – 2018-11-18 (×3): 40 mg via ORAL
  Filled 2018-11-16 (×3): qty 1

## 2018-11-16 MED ORDER — DIVALPROEX SODIUM 125 MG PO CSDR
250.0000 mg | DELAYED_RELEASE_CAPSULE | Freq: Three times a day (TID) | ORAL | Status: DC
  Administered 2018-11-16 – 2018-11-19 (×9): 250 mg via ORAL
  Filled 2018-11-16 (×12): qty 2

## 2018-11-16 MED ORDER — LABETALOL HCL 5 MG/ML IV SOLN
20.0000 mg | INTRAVENOUS | Status: DC | PRN
  Administered 2018-11-16 – 2018-11-18 (×3): 20 mg via INTRAVENOUS
  Filled 2018-11-16 (×3): qty 4

## 2018-11-16 MED ORDER — AMLODIPINE BESYLATE 2.5 MG PO TABS
2.5000 mg | ORAL_TABLET | Freq: Every day | ORAL | Status: DC
  Administered 2018-11-17 – 2018-11-18 (×2): 2.5 mg via ORAL
  Filled 2018-11-16 (×2): qty 1

## 2018-11-16 NOTE — Procedures (Signed)
Patient Name: Walter Larsen  MRN: [id number]  Epilepsy Attending: Lora Havens  Referring Physician/Provider: Dr. Antony Contras Date: 11/16/2018 Duration: 25.15 minutes  Patient history: 83 year old male with multifocal hemorrhages.  EEG ordered to evaluate for seizures.  Level of alertness: Awake  Technical aspects: This EEG study was done with scalp electrodes positioned according to the 10-20 International system of electrode placement. Electrical activity was acquired at a sampling rate of 500Hz  and reviewed with a high frequency filter of 70Hz  and a low frequency filter of 1Hz . EEG data were recorded continuously and digitally stored.   BACKGROUND ACTIVITY:  The posterior dominant rhythm consists of 7 Hz activity of moderate voltage (25-35 uV) seen predominantly in posterior head regions, symmetric and reactive to eye opening and eye closing.  Intermittent rhythmic generalized 4 to 5 Hz theta slowing was seen, maximal in left frontocentral region.  Some sharp transients were also seen in right temporal region.  Hyperventilation and photic stimulation were not performed.  IMPRESSION: This study is suggestive of mild diffuse encephalopathy. No seizures or epileptiform discharges were seen throughout the recording.  [name]

## 2018-11-16 NOTE — Progress Notes (Signed)
EEG complete - results pending 

## 2018-11-16 NOTE — Progress Notes (Signed)
STROKE TEAM PROGRESS NOTE   INTERVAL HISTORY Last evening, patient was able to complete swallow study. SLP recommended dysphasia 2 texture, thin liquids, crush meds, full supervision and continued ST due to decreased alertness/endurance. No acute events overnight. This AM, he continued to sleepy, confused and disoriented. Last dose of haldol was over 24 hrs ago. An EEG today showed some mild diffuse encephalopathy but no seizure or epileptiform discharges.  His blood pressure is adequately controlled.  Plan to start him on oral blood pressure medications as well as PRN IV injections and wean off Cleviprex drip and transferred to the floor later today  Vitals:   11/16/18 0900 11/16/18 1000 11/16/18 1100 11/16/18 1141  BP: (!) 141/94  (!) 178/84   Pulse:      Resp: 20 20 (!) 26   Temp:    98.6 F (37 C)  TempSrc:    Axillary  SpO2:      Weight:      Height:        CBC:  Recent Labs  Lab 11/14/18 0742 11/14/18 0749  WBC 9.9  --   NEUTROABS 7.6  --   HGB 14.6 15.0  HCT 44.3 44.0  MCV 96.1  --   PLT 184  --     Basic Metabolic Panel:  Recent Labs  Lab 11/14/18 0742 11/14/18 0749  NA 139 139  K 3.7 3.6  CL 103 104  CO2 21*  --   GLUCOSE 137* 138*  BUN 13 16  CREATININE 1.17 1.10  CALCIUM 10.3  --    Urine Drug Screen:     Component Value Date/Time   LABOPIA NONE DETECTED 11/14/2018 1220   COCAINSCRNUR NONE DETECTED 11/14/2018 1220   LABBENZ NONE DETECTED 11/14/2018 1220   AMPHETMU NONE DETECTED 11/14/2018 1220   THCU NONE DETECTED 11/14/2018 1220   LABBARB NONE DETECTED 11/14/2018 1220    Alcohol Level     Component Value Date/Time   ETH <10 11/14/2018 0742    IMAGING CT head wo contrast 11/14/2018 IMPRESSION: 1. Multifocal acute intra-axial hemorrhages in both posterior cerebral hemispheres, larger on the right (where collective blood volume is estimated at 55 mL). Underlying posterior right hemisphere encephalomalacia from a 2014 intra-axial hemorrhage.  Additional subtle 9-10 mm hyperdense mass-like area in the posterior left cingulate. 2. Associated cerebral edema, but no midline shift or loss of basilar cisterns. No intraventricular extension. Trace extension into the subarachnoid space suspected. 3. 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 4. Underlying chronic white matter disease.  MR Brain wo contrast 11/15/2018 IMPRESSION: 1. Incomplete examination. 2. Parenchymal hemorrhages posteriorly in both cerebral hemispheres with associated small volume subarachnoid hemorrhage as described on today's CT. 3. Multiple additional chronic hemorrhages which may reflect cerebral amyloid angiopathy. 4. Punctate foci of diffusion abnormality in the left frontal lobe and left cerebellum, possibly subacute infarcts.  CT head wo contrast 11/15/2018 IMPRESSION: 1. There is redemonstrated multifocal acute parenchymal hemorrhage. A large hematoma of the right frontotemporoparietal junction is enlarged, measuring 5.4 x 3.7 cm, previously 3.8 x 3.7 cm when measured similarly (series 3, image 17). 2. An adjacent focus of hemorrhage in the right occipital lobe is not significantly changed measuring 2.2 x 1.3 cm (series 3, image 16). Hemorrhage of the left parietooccipital lobe is not significantly changed measuring 3.4 x 2.1 cm (series 3, image 13). A very small focus of hemorrhage seen on prior examination in the medial posterior left frontal lobe  is not well appreciated (series 3, image 26). Distribution of multifocal parenchymal hemorrhage again suggests amyloid angiopathy. 3. There is minimal bilateral subarachnoid hemorrhage without substantial subdural collection. There is mild mass effect on the right lateral ventricle without significant midline shift. 4. Underlying small-vessel white matter disease. Unchanged right occipital encephalomalacia.  CXR 11/14/2018 IMPRESSION: RIGHT basilar atelectasis.  Atherosclerotic calcification aorta.  ECHO 11/15/2018 IMPRESSION: EF 55-60%. Increased LV wall thickness with impaired relaxation  Carotid U/S 11/15/2018 IMPRESSION: Right Carotid: Velocities in the right ICA are consistent with a 1-39% stenosis. Left Carotid: Velocities in the left ICA are consistent with a 1-39% stenosis. Vertebrals: Bilateral vertebral arteries demonstrate antegrade flow.  EEG 11/16/2018 IMPRESSION: This study is suggestive of mild diffuse encephalopathy. No seizures or epileptiform discharges were seen throughout the recording.  PHYSICAL EXAM Gen: NAD, comfortably in bed, not following commands. Off sedatives for 24 hrs HEENT-  [place]/AT. Neck supple  Lungs - CTAB, no increased WOB CV: RRR, no murmurs, gallops or rubs Abdomen - NT/ND Extremities - Warm, well perfused. No edema. Distal pulses 2+ Neurologic Examination: Mental Status: Sleepy, confused and disoriented. Can barely be aroused. Speech is slightly dysarthric. Follows barely some midline commands.  Does not follow motor commands.  Cranial Nerves: .Patient does not blink to threat on either side. Fundi could not be visualized.  Does not seem to able to count fingers. No facial weakness. Tongue midline.   Motor: Normal tone throughout; no atrophy noted. Able to move all 4 extremities against gravity without focal weakness. Deep Tendon Reflexes: Unremarkable x 4 Plantars: Right: downgoing Left: downgoing  Gait: Deferred   ASSESSMENT/PLAN [name] is a 83 y.o. male with PMH of stroke 2014 with residual visual deficits, CAD s/p stenting x3, who presented with acute onset of bilateral blindness and found to have multifocal acute intra-axial hemorrhages in both posterior cerebral hemispheres, larger on the right on head CT. Underlying posterior right hemisphere encephalomalacia from a 2014 intra-axial hemorrhage. Additional subtle 9-10 mm hyperdense mass-like area in the posterior left cingulate.    Stroke:  Bilateral posterior cerebral hemisphere hemorrhage likely secondary to cerebral amyloid angiopathy. Doubt metastases. CXR neg for lung nodules  Code Stroke CT head Multifocal acute intra-axial hemorrhages in both posterior cerebral hemispheres, larger on the right (where collective blood volume is estimated at 55 mL).  MRI Brain with parenchymal hemorrhages posteriorly in both cerebral hemispheres with associated small volume subarachnoid hemorrhage as described on code stroke CT.  Carotid Doppler shows 1-39% stenosis in bilateral ICA w/ antegrade flow in bilateral vertebral arteries  EEG suggestive of mild diffuse encephalopathy but no seizures or epileptiform discharges   2D Echo shows EF 55-60%. Increased LV wall thickness with impaired relaxation  LDL 96  HgbA1c 5.8  SCDs for VTE prophylaxis  Diet: Dysphasia 2 - Thin liquids  No antithrombotic prior to admission, now on No antithrombotic. On SCDs  Therapy recommendations:  CIR  Disposition: Transfer to floor  Benign Essential HTN  Home meds: Metoprolol 50 mg daily  Unstable w/ sBP in 140-170 range  Discontinue cleviprex drip  Start labetalol 20 mg IV q6h  Continue toprol XL 50 mg po daily  Continue hydralazine 20 mg PRN q6h for sBP > 160 . Long-term BP goal normotensive  Hyperlipidemia  Home meds: Crestor 40 mg daily, fenofibrate 160 mg resumed in hospital  LDL 96, goal < 70  Continue statin at discharge  Diabetes type II Controlled  No home meds  HgbA1c 5.8, goal <  7.0  SSI not indicated at this time  Daily CBGs  Dysphasia 2  Failed swallow study on 8/2 AM  Decreased alertness/endurance during repeat study  SLP recommending dysphasia 2 (fine chop);Thin liquid  Other Stroke Risk Factors  Advanced age  Hx stroke in 2014, posterior right hemisphere encephalomalacia intra-axial hemorrhage  Coronary artery disease  Other Active Problems    Hospital day # 2  Plan:  - EEG neg for  seizure or epilepsy - Off precedex and haldol, start depakote 250 mg po q8h for agitation - Continute hydralazine prn for sBP > 160 with BP goal of < 160.  Will add Norvasc 2.5 mg daily- F/u CBC, BMP - Transfer to floor if off drips and able to swallow fine.  Linwood Dibbles, MS4 I have personally obtained history,examined this patient, reviewed notes, independently viewed imaging studies, participated in medical decision making and plan of care.ROS completed by me personally and pertinent positives fully documented  I have made any additions or clarifications directly to the above note. .Patient is slightly improved off sedation but still exam is limited due to lack of cooperation.  Plan to transition from IV blood pressure drip to p.o. with PRN IV injections.  Continue ongoing therapies.  Transfer to neurology floor bed later today.  Discussed with son yesterday over the phone and answered questions.  Greater than 50% time during this 35-minute visit was spent on counseling and coordination of care and discussion with care team.  Antony Contras, MD Medical Director [facility] Pager: [phone] 11/16/2018 2:01 PM   To contact Stroke Continuity provider, please refer to [email or web address] After hours, contact General Neurology

## 2018-11-16 NOTE — Progress Notes (Signed)
Physical Therapy Treatment Patient Details Name: Walter Larsen MRN: [id number] DOB: 10/24/1935 Today's Date: 11/16/2018    History of Present Illness Patient is a 83 y/o male who presents with vision loss. Head CT-reveals multifocal acute intracerebral hemorrhages bilaterally, both posterior cerebral hemispheres, larger on the right. PMH includes stroke and CAD.    PT Comments    Patient progressing slowly towards PT goals. Pt continues to be confused, restless and impulsive. Difficulty following commands. Attempting to climb OOB upon arrival. Requires 2 person assist for safety to stand and take a few steps to transfer to other bed. Needs max cues for directions and assist for balance especially due to blindness. Difficulty progressing due to cognition and confusion. Will follow.   Follow Up Recommendations  CIR;Supervision for mobility/OOB     Equipment Recommendations  Other (comment)(defer)    Recommendations for Other Services       Precautions / Restrictions Precautions Precautions: Fall Precaution Comments: SBP <160; cortically blind Restrictions Weight Bearing Restrictions: No    Mobility  Bed Mobility Overal bed mobility: Needs Assistance Bed Mobility: Supine to Sit     Supine to sit: Mod assist;+2 for physical assistance;HOB elevated Sit to supine: Mod assist;+2 for physical assistance;HOB elevated   General bed mobility comments: Assist with LEs and trunk to get to EOB. Able to initiate movement with tactile cues.  Transfers Overall transfer level: Needs assistance Equipment used: 2 person hand held assist Transfers: Sit to/from Stand Sit to Stand: Mod assist;+2 physical assistance         General transfer comment: Assist of 2 to stand from EOB with good initiation for anterior translation. Difficulty following commands once upright, able to be directed towards other bed. Posterior bias.  Ambulation/Gait Ambulation/Gait assistance: Mod assist;+2 physical  assistance Gait Distance (Feet): 4 Feet Assistive device: 2 person hand held assist Gait Pattern/deviations: Step-to pattern     General Gait Details: Able to take a few steps to transfer to other bed with max directional cues due to confusion and visual deficits. Able to be redirected. 2 person for safety.   Stairs             Wheelchair Mobility    Modified Rankin (Stroke Patients Only) Modified Rankin (Stroke Patients Only) Pre-Morbid Rankin Score: Moderate disability Modified Rankin: Moderately severe disability     Balance Overall balance assessment: Needs assistance Sitting-balance support: Feet supported;No upper extremity supported Sitting balance-Leahy Scale: Fair Sitting balance - Comments: Able to sit EOB with close min guard; attempting to stand multiple times. Impulsive.   Standing balance support: During functional activity Standing balance-Leahy Scale: Poor Standing balance comment: External support needed, posterior bias.                            Cognition Arousal/Alertness: Lethargic Behavior During Therapy: Restless;Impulsive Overall Cognitive Status: Difficult to assess Area of Impairment: Attention;Following commands;Awareness;Problem solving                   Current Attention Level: Focused   Following Commands: Follows one step commands inconsistently;Follows one step commands with increased time(tactile cues needed, repetition)   Awareness: Intellectual Problem Solving: Requires verbal cues;Requires tactile cues;Difficulty sequencing;Slow processing General Comments: Not very verbal today; writhing with BUEs in air trying to grab ontl therapist or anything likely due to visual deficits. Appears to be HOH vs very confused. Follows some commands inconsistently and needs max multimodal cues.  Exercises      General Comments General comments (skin integrity, edema, etc.): VSS throughout.      Pertinent  Vitals/Pain Pain Assessment: Faces Faces Pain Scale: No hurt    Home Living                      Prior Function            PT Goals (current goals can now be found in the care plan section) Progress towards PT goals: Progressing toward goals    Frequency    Min 4X/week      PT Plan Current plan remains appropriate    Co-evaluation              AM-PAC PT "6 Clicks" Mobility   Outcome Measure  Help needed turning from your back to your side while in a flat bed without using bedrails?: A Lot Help needed moving from lying on your back to sitting on the side of a flat bed without using bedrails?: A Lot Help needed moving to and from a bed to a chair (including a wheelchair)?: A Lot Help needed standing up from a chair using your arms (e.g., wheelchair or bedside chair)?: A Lot Help needed to walk in hospital room?: A Lot Help needed climbing 3-5 steps with a railing? : Total 6 Click Score: 11    End of Session Equipment Utilized During Treatment: Gait belt Activity Tolerance: Patient limited by lethargy(confusion) Patient left: in bed;with call bell/phone within reach;with bed alarm set;with nursing/sitter in room(sitter and RN present to tx pt to another floor) Nurse Communication: Mobility status PT Visit Diagnosis: Unsteadiness on feet (R26.81);Difficulty in walking, not elsewhere classified (R26.2)     Time: 1610-9604 PT Time Calculation (min) (ACUTE ONLY): 15 min  Charges:  $Therapeutic Activity: 8-22 mins                     Wray Kearns, PT, DPT Acute Rehabilitation Services Pager [phone] Office [place] 11/16/2018, 3:54 PM

## 2018-11-16 NOTE — Progress Notes (Signed)
Inpatient Rehab Admissions:  Inpatient Rehab Consult received.  I met with pt and his son at the bedside for rehabilitation assessment. Pt restless in bed, reaching out for items not present. AC spoke with pt's son [name] about [employer] program, estimated LOS, and anticipated level of assistance at DC. Pt's son feels like this is the route his family would want to take but would like to speak with them tonight to ensure 24/7 A at DC. AC will follow up with pt/family tomorrow. Will begin insurance authorization process if decision is made to proceed with CIR.   Jhonnie Garner, OTR/L  Rehab Admissions Coordinator  [phone] 11/16/2018 6:58 PM

## 2018-11-17 LAB — BASIC METABOLIC PANEL
Anion gap: 14 (ref 5–15)
BUN: 14 mg/dL (ref 8–23)
CO2: 22 mmol/L (ref 22–32)
Calcium: 9 mg/dL (ref 8.9–10.3)
Chloride: 101 mmol/L (ref 98–111)
Creatinine, Ser: 0.96 mg/dL (ref 0.61–1.24)
GFR calc Af Amer: >60 mL/min (ref >60)
GFR calc non Af Amer: >60 mL/min (ref >60)
Glucose, Bld: 111 mg/dL — ABNORMAL HIGH (ref 70–99)
Potassium: 3.2 mmol/L — ABNORMAL LOW (ref 3.5–5.1)
Sodium: 137 mmol/L (ref 135–145)

## 2018-11-17 LAB — CBC
HCT: 43.2 % (ref 39.0–52.0)
Hemoglobin: 15 g/dL (ref 13.0–17.0)
MCH: 32.1 pg (ref 26.0–34.0)
MCHC: 34.7 g/dL (ref 30.0–36.0)
MCV: 92.3 fL (ref 80.0–100.0)
Platelets: 186 10*3/uL (ref 150–400)
RBC: 4.68 MIL/uL (ref 4.22–5.81)
RDW: 13 % (ref 11.5–15.5)
WBC: 12.1 10*3/uL — ABNORMAL HIGH (ref 4.0–10.5)
nRBC: 0 % (ref 0.0–0.2)

## 2018-11-17 MED ORDER — ORAL CARE MOUTH RINSE
15.0000 mL | Freq: Two times a day (BID) | OROMUCOSAL | Status: DC
  Administered 2018-11-17 – 2018-11-19 (×5): 15 mL via OROMUCOSAL

## 2018-11-17 MED ORDER — METOPROLOL TARTRATE 25 MG PO TABS
25.0000 mg | ORAL_TABLET | Freq: Two times a day (BID) | ORAL | Status: DC
  Administered 2018-11-17 – 2018-11-19 (×5): 25 mg via ORAL
  Filled 2018-11-17 (×5): qty 1

## 2018-11-17 NOTE — PMR Pre-admission (Signed)
PMR Admission Coordinator Pre-Admission Assessment  Patient: Walter Larsen is an 83 y.o., male MRN: [id number] DOB: [birth year] Height: 6' (182.9 cm) Weight: 84.9 kg  Insurance Information HMO:     PPO: yes     PCP:      IPA:      80/20:      OTHER:  PRIMARY: [employer] Medicare       Policy#: [id number]      Subscriber: Patient CM Name: Mickel Baas      Phone#: [phone]     Fax#: [phone] Pre-Cert#: [id number]      Employer:  Josem Kaufmann [phone] approved by Mickel Baas for admit to CIR. Pt is approved for 7 days (8/7 to 8/13). Follow up CM is Chelsea (p): [phone] (f): [phone] Benefits:  Phone #: online     Name: [email or web address] Eff. Date: 04/14/2018 still active     Deduct: $0      Out of Pocket Max: $4,200 ($50.00 met)      Life Max: NA CIR: $250/day co-pay with max co-pay/admission of $1,500 (6 days)      SNF: $0/day co-pay for days 1-20, $178/day co-pay for days 21-100; limited to 100 days/cal yr. Outpatient: limited by medical necessity     Co-Pay: $35/visist Home Health: 100%; limited by medical necessity      Co-Pay: 0% DME: 80%     Co-Pay: 20% Providers:  SECONDARY: None      Policy#:       Subscriber:  CM Name:       Phone#:      Fax#:  Pre-Cert#:       Employer:  Benefits:  Phone #:      Name:  Eff. Date:      Deduct:       Out of Pocket Max:       Life Max:  CIR:       SNF: Outpatient:      Co-Pay:  Home Health:       Co-Pay:  DME:      Co-Pay:   Medicaid Application Date:       Case Manager:  Disability Application Date:       Case Worker:   The "Data Collection Information Summary" for patients in Inpatient Rehabilitation Facilities with attached "Privacy Act [place] Records" was provided and verbally reviewed with: Family  Emergency Contact Information Contact Information    Name Relation Home Work Mobile   Bacus,Linda Spouse [phone]  [phone]   [place], [place] [phone]        Current Medical History  Patient Admitting Diagnosis:  Bilateral posterior cerebral hemisphere hemorrhage   History of Present Illness: Pt is an 83 yo M with history of CAD s/p sent x3, and 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 Pt presented to the hospital with an acute onset of bilateral blindness and mild confusion. CT showed multifocal acute intracerebral hemorrhages bilaterally. 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 Underlying chronic white matter disease was also noted. Pt was admitted to neuro ICU. Carotid dopplers showed 1-39% stenosis in bilateral ICA. 2D echo showed ER 55-60%. Pt failed a swallow study on 8/2. Pt recommended for DYS 2, thin liquids. Pt has been confused and disoriented requiring haldol. EEG showed some mild diffuse encephalopathy but no seizure or epileptiform discharges. Pt has been weaned off Cleviprex drip. Pt has been evaluated by therapies with recommendation for CIR.   Complete NIHSS TOTAL: 6  Patient's medical [facility] from [facility] has been reviewed by the rehabilitation admission coordinator and physician.  Past Medical History  Past Medical History:  Diagnosis Date  . Coronary artery disease    3 stents  . Stroke [facility])     Family History   family history includes Hypertension in his father and mother.  Prior Rehab/Hospitalizations Has the patient had prior rehab or hospitalizations prior to admission? Yes  Has the patient had major surgery during 100 days prior to admission? No   Current Medications  Current Facility-Administered Medications:  .  acetaminophen (TYLENOL) tablet 650 mg, 650 mg, Oral, Q4H PRN, 650 mg at 11/18/18 2132 **OR** acetaminophen (TYLENOL) solution 650 mg, 650 mg, Per Tube, Q4H PRN **OR** acetaminophen (TYLENOL) suppository 650 mg, 650 mg, Rectal, Q4H PRN, Biby, Sharon L, NP .  amLODipine (NORVASC) tablet 5 mg, 5 mg, Oral, Daily, Rizwan, Saima, MD, 5 mg at 11/19/18 0804 .  divalproex (DEPAKOTE SPRINKLE) capsule 250 mg, 250  mg, Oral, Q8H, Biby, Sharon L, NP, 250 mg at 11/19/18 0960 .  fenofibrate tablet 160 mg, 160 mg, Oral, Daily, Biby, Sharon L, NP, 160 mg at 11/19/18 0805 .  haloperidol lactate (HALDOL) injection 1 mg, 1 mg, Intravenous, Q6H PRN, Burnetta Sabin L, NP, 1 mg at 11/19/18 1120 .  hydrALAZINE (APRESOLINE) injection 20 mg, 20 mg, Intravenous, Q6H PRN, Donzetta Starch, NP, 20 mg at 11/16/18 2016 .  labetalol (NORMODYNE) injection 20 mg, 20 mg, Intravenous, Q2H PRN, Biby, Sharon L, NP, 20 mg at 11/18/18 1703 .  MEDLINE mouth rinse, 15 mL, Mouth Rinse, BID, Garvin Fila, MD, 15 mL at 11/19/18 0806 .  metoprolol tartrate (LOPRESSOR) tablet 25 mg, 25 mg, Oral, BID, Masters, Jake Church, RPH, 25 mg at 11/19/18 0804 .  pantoprazole (PROTONIX) EC tablet 40 mg, 40 mg, Oral, QHS, Biby, Sharon L, NP, 40 mg at 11/18/18 2131 .  senna-docusate (Senokot-S) tablet 1 tablet, 1 tablet, Oral, BID, Biby, Sharon L, NP, 1 tablet at 11/19/18 0804 .  tamsulosin (FLOMAX) capsule 0.4 mg, 0.4 mg, Oral, QPC breakfast, Biby, Sharon L, NP, 0.4 mg at 11/19/18 0804  Patients Current Diet:  Diet Order            Diet - low sodium heart healthy        DIET DYS 2 Room service appropriate? Yes with Assist; Fluid consistency: Thin  Diet effective now              Precautions / Restrictions Precautions Precautions: Fall Precaution Comments: SBP <160; cortically blind(can see shadows/light/shapes close to him) Restrictions Weight Bearing Restrictions: No   Has the patient had 2 or more falls or a fall with injury in the past year? No  Prior Activity Level Community (5-7x/wk): did not drive due to long standing visual deficits; was active around the house, walked to mailbox daily, uses no AD, Independent PTA  Prior Functional Level Self Care: Did the patient need help bathing, dressing, using the toilet or eating? Independent  Indoor Mobility: Did the patient need assistance with walking from room to room (with or without  device)? Independent  Stairs: Did the patient need assistance with internal or external stairs (with or without device)? Independent  Functional Cognition: Did the patient need help planning regular tasks such as shopping or remembering to take medications? Independent  Home Assistive Devices / Equipment Home Assistive Devices/Equipment: None Home Equipment: Tub bench, Bedside commode  Prior Device Use: Indicate devices/aids used by the patient prior to current illness, exacerbation or  injury? None of the above  Current Functional Level Cognition  Arousal/Alertness: Awake/alert Overall Cognitive Status: Impaired/Different from baseline Difficult to assess due to: Hard of hearing/deaf, Impaired communication Current Attention Level: Sustained Orientation Level: Oriented to person Following Commands: Follows one step commands consistently(tactile cues needed, repetition) General Comments: pt follows commands much more consistently this session and with appropriate conversation Attention: Focused, Sustained Focused Attention: Impaired Focused Attention Impairment: Verbal complex Sustained Attention: Impaired Sustained Attention Impairment: Verbal complex Memory: Impaired Memory Impairment: Storage deficit, Decreased recall of new information, Retrieval deficit(Immediate: 2/3; with cue: 1/1; Delayed: 0/3) Awareness: Impaired Awareness Impairment: Intellectual impairment Problem Solving: Impaired Problem Solving Impairment: Verbal complex, Verbal basic    Extremity Assessment (includes Sensation/Coordination)  Upper Extremity Assessment: Generalized weakness  Lower Extremity Assessment: Generalized weakness, Difficult to assess due to impaired cognition    ADLs  Overall ADL's : Needs assistance/impaired Grooming: Wash/dry face, Wash/dry hands, Sitting, Minimal assistance Grooming Details (indicate cue type and reason): mod cues to locate wash cloth and to utilize appropriate item  as pt wiping and blowing nose in hospital gown Upper Body Dressing : Moderate assistance Upper Body Dressing Details (indicate cue type and reason): mod assist to don hospital gown as pt with difficulty threading arms through arm holes due to impaired vision, cognition, and confusion Functional mobility during ADLs: +2 for physical assistance, Maximal assistance General ADL Comments: Mod-max assist for bed mobility due to difficulty with following directions and increased confusion.  Engaged in sit > stand from EOB but unsafe to transfer OOB at this time due to cognition impeding progress.    Mobility  Overal bed mobility: Needs Assistance Bed Mobility: Supine to Sit Rolling: Max assist, Mod assist Supine to sit: Min assist Sit to supine: +2 for physical assistance, Max assist General bed mobility comments: assist to elevate trunk into sitting     Transfers  Overall transfer level: Needs assistance Equipment used: 1 person hand held assist, 2 person hand held assist Transfers: Sit to/from Stand, Stand Pivot Transfers Sit to Stand: Min assist, +2 safety/equipment Stand pivot transfers: Min assist, +2 physical assistance, +2 safety/equipment General transfer comment: pt stood from EOB with min A (+2 for safety) and able to pivot EOB to [facility] with min A +2 HHA; assist to steady and directional cues required due to vision    Ambulation / Gait / Stairs / Wheelchair Mobility  Ambulation/Gait Ambulation/Gait assistance: +2 physical assistance, Min assist, +2 safety/equipment Gait Distance (Feet): 6 Feet Assistive device: 2 person hand held assist Gait Pattern/deviations: Step-to pattern, Decreased step length - right, Decreased step length - left General Gait Details: directional cues and assistance for balance; pt with short step lengths; no LOB    Posture / Balance Dynamic Sitting Balance Sitting balance - Comments: Able to sit EOB with close min guard; attempting to stand multiple times.  Impulsive. Balance Overall balance assessment: Needs assistance Sitting-balance support: Feet supported, No upper extremity supported Sitting balance-Leahy Scale: Fair Sitting balance - Comments: Able to sit EOB with close min guard; attempting to stand multiple times. Impulsive. Standing balance support: During functional activity Standing balance-Leahy Scale: Poor Standing balance comment: External support needed, posterior bias.    Special needs/care consideration BiPAP/CPAP : no CPM : no Continuous Drip IV : no Dialysis : no        Days : no Life Vest : no Oxygen : no, RA Special Bed : no Trach Size : no Wound Vac (area) : no  Location : no Skin: ecchymosis right thigh and arm                       Bowel mgmt: continent 11/18/18 Bladder mgmt: continent, has condom cath Diabetic mgmt: no Behavioral consideration : confused, disoriented; safety precautions with 1:1 sitter. Chemo/radiation : no   Previous Home Environment (from acute therapy documentation) Living Arrangements: Spouse/significant other  Lives With: Spouse Available Help at Discharge: Family Type of Home: House Home Layout: One level Home Access: Stairs to enter Entrance Stairs-Rails: Right, Left Entrance Stairs-Number of Steps: 4 Bathroom Shower/Tub: Tub/shower unit, Architectural technologist: Standard Home Care Services: No Additional Comments: All info above was taken from prior admission as pt not able to provide info this session  Discharge Living Setting Plans for Discharge Living Setting: Patient's home, Lives with (comment)(lives with wife) Type of Home at Discharge: House Discharge Home Layout: One level Discharge Home Access: [place] entrance Discharge Bathroom Shower/Tub: Tub/shower unit Discharge Bathroom Toilet: Handicapped height Discharge Bathroom Accessibility: Yes How Accessible: Accessible via walker Does the patient have any problems obtaining your medications?:  No  Social/Family/Support Systems Patient Roles: Spouse Contact Information: wife: [name] [phone]; son [name]): [phone] Anticipated Caregiver: wife, son, grandchildren, hired help as needed Anticipated Ambulance person Information: see above Ability/Limitations of Caregiver: Min A Caregiver Availability: 24/7 Discharge Plan Discussed with Primary Caregiver: Yes Is Caregiver In Agreement with Plan?: Yes Does Caregiver/Family have Issues with Lodging/Transportation while Pt is in Rehab?: No  Goals/Additional Needs Patient/Family Goal for Rehab: PT: Min A; OT: Min/Mod A; SLP: Min A Expected length of stay: 7-10 days Cultural Considerations: NA Dietary Needs: DYS 2, thin liquids; room service with assist. Equipment Needs: TBD Pt/Family Agrees to Admission and willing to participate: Yes Program Orientation Provided & Reviewed with Pt/Caregiver Including Roles  & Responsibilities: Yes(son who has been in communication with rest of the family); wife  Barriers to Discharge: Incontinence, Lack of/limited family support, Insurance for SNF coverage, Behavior  Barriers to Discharge Comments: family open to hiring help as needed; worried about potential cath care, would need training; pt currently in [place] due to restlessness/safety concerns  Decrease burden of Care through IP rehab admission: NA  Possible need for SNF placement upon discharge: Not anticipated; Pt has very supportive family who plans to have 24/7 A at DC. They understand his anticipated level of assist at DC and are prepared to have pt return home with that level of assist.   Patient Condition: I have reviewed medical records from [facility], spoken with RN, MD and patient and son and wife. I met with patient at the bedside for inpatient rehabilitation assessment.  Patient will benefit from ongoing PT, OT and SLP, can actively participate in 3 hours of therapy a day 5 days of the week, and can make  measurable gains during the admission.  Patient will also benefit from the coordinated team approach during an Inpatient Acute Rehabilitation admission.  The patient will receive intensive therapy as well as Rehabilitation physician, nursing, social worker, and care management interventions.  Due to bladder management, bowel management, safety, skin/wound care, disease management, medication administration, pain management and patient education the patient requires 24 hour a day rehabilitation nursing.  The patient is currently Min A x2 for transfers and ambulation of 6 feet and Min to Max X2 for basic ADLs.  Discharge setting and therapy post discharge at home with home health is anticipated.  Patient has agreed to participate  in the Acute Inpatient Rehabilitation Program and will admit 11/19/2018.  Preadmission Screen Completed By:  Jhonnie Garner, 11/19/2018 12:21 PM  ______________________________________________________________________   Discussed status with Dr. Naaman Plummer on 11/19/2018 at 12:00PM and received approval for admission today.  Admission Coordinator:  Jhonnie Garner, OT, time 12:00PM/Date 11/19/2018   Assessment/Plan: Diagnosis: bilateral posterior cerebral hemorrhages 1. Does the need for close, 24 hr/day Medical supervision in concert with the patient's rehab needs make it unreasonable for this patient to be served in a less intensive setting? Yes 2. Co-Morbidities requiring supervision/potential complications: HTN, CAD, dysphagia 3. Due to bladder management, bowel management, safety, skin/wound care, disease management, medication administration, pain management and patient education, does the patient require 24 hr/day rehab nursing? Yes 4. Does the patient require coordinated care of a physician, rehab nurse, PT (1-2 hrs/day, 5 days/week), OT (1-2 hrs/day, 5 days/week) and SLP (1-2 hrs/day, 5 days/week) to address physical and functional deficits in the context of the above medical diagnosis(es)?  Yes Addressing deficits in the following areas: balance, endurance, locomotion, strength, transferring, bowel/bladder control, bathing, dressing, feeding, grooming, toileting, cognition, speech, swallowing and psychosocial support 5. Can the patient actively participate in an intensive therapy program of at least 3 hrs of therapy 5 days a week? Yes 6. The potential for patient to make measurable gains while on inpatient rehab is excellent 7. Anticipated functional outcomes upon discharge from inpatients are: min assist PT, min assist and mod assist OT, min assist SLP 8. Estimated rehab length of stay to reach the above functional goals is: 7-10 days 9. Anticipated D/C setting: Home 10. Anticipated post D/C treatments: [place] therapy 11. Overall Rehab/Functional Prognosis: excellent  MD Signature: Meredith Staggers, MD, [facility] 11/19/2018

## 2018-11-17 NOTE — Care Management Important Message (Signed)
Important Message  Patient Details  Name: ZAEDYN COVIN MRN: [id number] Date of Birth: 04/01/1936   Medicare Important Message Given:  Yes     [place] 11/17/2018, 1:11 PM

## 2018-11-17 NOTE — Progress Notes (Signed)
STROKE TEAM PROGRESS NOTE   INTERVAL HISTORY Patient is intermittently agitated and confused.  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  Blood pressure is adequately controlled.  He has been evaluated for rehab coordinator who spoke to family who have concerns whether they would be able to care for him at home.  Vitals:   11/17/18 0853 11/17/18 0914 11/17/18 0945 11/17/18 1139  BP: (!) 157/71 (!) 155/70 129/65 128/62  Pulse:    76  Resp:    20  Temp:    98.5 F (36.9 C)  TempSrc:    Oral  SpO2:    95%  Weight:      Height:        CBC:  Recent Labs  Lab 11/14/18 0742 11/14/18 0749 11/17/18 0449  WBC 9.9  --  12.1*  NEUTROABS 7.6  --   --   HGB 14.6 15.0 15.0  HCT 44.3 44.0 43.2  MCV 96.1  --  92.3  PLT 184  --  671    Basic Metabolic Panel:  Recent Labs  Lab 11/14/18 0742 11/14/18 0749 11/17/18 0449  NA 139 139 137  K 3.7 3.6 3.2*  CL 103 104 101  CO2 21*  --  22  GLUCOSE 137* 138* 111*  BUN 13 16 14   CREATININE 1.17 1.10 0.96  CALCIUM 10.3  --  9.0   Urine Drug Screen:     Component Value Date/Time   LABOPIA NONE DETECTED 11/14/2018 1220   COCAINSCRNUR NONE DETECTED 11/14/2018 1220   LABBENZ NONE DETECTED 11/14/2018 1220   AMPHETMU NONE DETECTED 11/14/2018 1220   THCU NONE DETECTED 11/14/2018 1220   LABBARB NONE DETECTED 11/14/2018 1220    Alcohol Level     Component Value Date/Time   ETH <10 11/14/2018 0742    IMAGING CT head wo contrast 11/14/2018 IMPRESSION: 1. Multifocal acute intra-axial hemorrhages in both posterior cerebral hemispheres, larger on the right (where collective blood volume is estimated at 55 mL). Underlying posterior right hemisphere encephalomalacia from a 2014 intra-axial hemorrhage. Additional subtle 9-10 mm hyperdense mass-like area in the posterior left cingulate. 2. Associated cerebral edema, but no midline shift or loss of basilar cisterns. No intraventricular extension. Trace extension into the subarachnoid space  suspected. 3. 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 4. Underlying chronic white matter disease.  MR Brain wo contrast 11/15/2018 IMPRESSION: 1. Incomplete examination. 2. Parenchymal hemorrhages posteriorly in both cerebral hemispheres with associated small volume subarachnoid hemorrhage as described on today's CT. 3. Multiple additional chronic hemorrhages which may reflect cerebral amyloid angiopathy. 4. Punctate foci of diffusion abnormality in the left frontal lobe and left cerebellum, possibly subacute infarcts.  CT head wo contrast 11/15/2018 IMPRESSION: 1. There is redemonstrated multifocal acute parenchymal hemorrhage. A large hematoma of the right frontotemporoparietal junction is enlarged, measuring 5.4 x 3.7 cm, previously 3.8 x 3.7 cm when measured similarly (series 3, image 17). 2. An adjacent focus of hemorrhage in the right occipital lobe is not significantly changed measuring 2.2 x 1.3 cm (series 3, image 16). Hemorrhage of the left parietooccipital lobe is not significantly changed measuring 3.4 x 2.1 cm (series 3, image 13). A very small focus of hemorrhage seen on prior examination in the medial posterior left frontal lobe is not well appreciated (series 3, image 26). Distribution of multifocal parenchymal hemorrhage again suggests amyloid angiopathy. 3. There is minimal bilateral subarachnoid hemorrhage without substantial subdural collection. There is mild  mass effect on the right lateral ventricle without significant midline shift. 4. Underlying small-vessel white matter disease. Unchanged right occipital encephalomalacia.  CXR 11/14/2018 IMPRESSION: RIGHT basilar atelectasis. Atherosclerotic calcification aorta.  ECHO 11/15/2018 IMPRESSION: EF 55-60%. Increased LV wall thickness with impaired relaxation  Carotid U/S 11/15/2018 IMPRESSION: Right Carotid: Velocities in the right ICA are consistent with a 1-39%  stenosis. Left Carotid: Velocities in the left ICA are consistent with a 1-39% stenosis. Vertebrals: Bilateral vertebral arteries demonstrate antegrade flow.  EEG 11/16/2018 IMPRESSION: This study is suggestive of mild diffuse encephalopathy. No seizures or epileptiform discharges were seen throughout the recording.  PHYSICAL EXAM  Gen: NAD, comfortably in bed, not following commands. Off sedatives for 24 hrs HEENT-  [place]/AT. Neck supple  Lungs - CTAB, no increased WOB CV: RRR, no murmurs, gallops or rubs Abdomen - NT/ND Extremities - Warm, well perfused. No edema. Distal pulses 2+ Neurologic Examination: Mental Status:  Awake but with eyes closed.  Does not blink to threat on either side.  Interactive confused and disoriented. Can barely be aroused. Speech is slightly dysarthric. Follows commands consistently..  Does moves all 4 extremities equally well against gravity without focal weakness.  Cranial Nerves: .Patient does not blink to threat on either side. Fundi could not be visualized.  Does not seem to able to count fingers. No facial weakness. Tongue midline.   Motor: Normal tone throughout; no atrophy noted. Able to move all 4 extremities against gravity without focal weakness. Deep Tendon Reflexes: Unremarkable x 4 Plantars: Right: downgoing Left: downgoing  Gait: Deferred   ASSESSMENT/PLAN [name] is a 83 y.o. male with PMH of stroke 2014 with residual visual deficits, CAD s/p stenting x3, who presented with acute onset of bilateral blindness and found to have multifocal acute intra-axial hemorrhages in both posterior cerebral hemispheres, larger on the right on head CT. Underlying posterior right hemisphere encephalomalacia from a 2014 intra-axial hemorrhage. Additional subtle 9-10 mm hyperdense mass-like area in the posterior left cingulate.    Stroke: Bilateral posterior cerebral hemisphere hemorrhage likely secondary to cerebral amyloid angiopathy. Doubt metastases. CXR  neg for lung nodules  Code Stroke CT head Multifocal acute intra-axial hemorrhages in both posterior cerebral hemispheres, larger on the right (where collective blood volume is estimated at 55 mL).  MRI Brain with parenchymal hemorrhages posteriorly in both cerebral hemispheres with associated small volume subarachnoid hemorrhage as described on code stroke CT.  Carotid Doppler shows 1-39% stenosis in bilateral ICA w/ antegrade flow in bilateral vertebral arteries  EEG suggestive of mild diffuse encephalopathy but no seizures or epileptiform discharges   2D Echo shows EF 55-60%. Increased LV wall thickness with impaired relaxation  LDL 96  HgbA1c 5.8  SCDs for VTE prophylaxis  Diet: Dysphasia 2 - Thin liquids  No antithrombotic prior to admission, now on No antithrombotic. On SCDs  Therapy recommendations:  CIR  Disposition: Transfer to floor  Benign Essential HTN  Home meds: Metoprolol 50 mg daily  Unstable w/ sBP in 140-170 range  Discontinue cleviprex drip  Start labetalol 20 mg IV q6h  Continue toprol XL 50 mg po daily  Continue hydralazine 20 mg PRN q6h for sBP > 160 . Long-term BP goal normotensive  Hyperlipidemia  Home meds: Crestor 40 mg daily, fenofibrate 160 mg resumed in hospital  LDL 96, goal < 70  Continue statin at discharge  Diabetes type II Controlled  No home meds  HgbA1c 5.8, goal < 7.0  SSI not indicated at this time  Daily CBGs  Dysphasia 2  Failed swallow study on 8/2 AM  Decreased alertness/endurance during repeat study  SLP recommending dysphasia 2 (fine chop);Thin liquid  Other Stroke Risk Factors  Advanced age  Hx stroke in 2014, posterior right hemisphere encephalomalacia intra-axial hemorrhage  Coronary artery disease  Other Active Problems    Hospital day # 3  .Patient is slightly improved .  But will likely need prolonged therapy and if family is unable to provide enough support at home may need to go to  skilled nursing facility.  Discussed with rehab coordinator.  Will consult medical hospitalist team to take over medical management starting tomorrow.  Discussed with Dr.Ghimire who is in agreement with plan.  Greater than 50% time during this 25-minute visit were spent on counseling and coordination of care and discussion with care team and answering questions. Antony Contras, MD Medical Director [facility] Pager: [phone] 11/17/2018 12:01 PM   To contact Stroke Continuity provider, please refer to [email or web address] After hours, contact General Neurology

## 2018-11-17 NOTE — Progress Notes (Signed)
Physical Therapy Treatment Patient Details Name: Walter Larsen MRN: [id number] DOB: Jan 08, 1936 Today's Date: 11/17/2018    History of Present Illness Patient is a 83 y/o male who presents with vision loss. Head CT-reveals multifocal acute intracerebral hemorrhages bilaterally, both posterior cerebral hemispheres, larger on the right. PMH includes stroke and CAD.    PT Comments    Patient seen for mobility progression. Pt is oriented to self only and with confusion throughout session. Pt is able to come into partial stand X 2 trials and unable third attempt with +2 assistance. Pt is not moving as well this session compared to previously noted PT session. NT reports pt did not sleep much overnight and has been very restless. Continue to progress as tolerated and recommend post acute rehab.    Follow Up Recommendations  CIR;Supervision for mobility/OOB     Equipment Recommendations  Other (comment)(defer)    Recommendations for Other Services       Precautions / Restrictions Precautions Precautions: Fall Precaution Comments: SBP <160; cortically blind Restrictions Weight Bearing Restrictions: No    Mobility  Bed Mobility Overal bed mobility: Needs Assistance Bed Mobility: Supine to Sit;Sit to Supine Rolling: Max assist;Mod assist   Supine to sit: +2 for physical assistance;HOB elevated;Max assist Sit to supine: +2 for physical assistance;Max assist   General bed mobility comments: pt assisted minimally to roll but inconsistently and requires max multimodal cues for sequencing/use of rails; +2 for assistance at bilat LE and trunk to get into sitting EOB and then for return to supine   Transfers Overall transfer level: Needs assistance Equipment used: 2 person hand held assist Transfers: Sit to/from Stand Sit to Stand: +2 physical assistance;Max assist;From elevated surface         General transfer comment: sit to stands X 3 with pt able to come into partial stand first two  trials however third attempt unable to bring buttocks off EOB; assistance to power up and to maintain balance in partial stand very briefly  Ambulation/Gait             General Gait Details: unable this session   Stairs             Wheelchair Mobility    Modified Rankin (Stroke Patients Only) Modified Rankin (Stroke Patients Only) Pre-Morbid Rankin Score: Moderate disability Modified Rankin: Moderately severe disability     Balance Overall balance assessment: Needs assistance Sitting-balance support: Feet supported;No upper extremity supported Sitting balance-Leahy Scale: Fair     Standing balance support: During functional activity Standing balance-Leahy Scale: Poor                              Cognition Arousal/Alertness: Lethargic Behavior During Therapy: Restless;Impulsive Overall Cognitive Status: Impaired/Different from baseline Area of Impairment: Attention;Following commands;Awareness;Problem solving;Orientation;Memory                 Orientation Level: Disoriented to;Place;Time;Situation Current Attention Level: Focused Memory: Decreased short-term memory Following Commands: Follows one step commands inconsistently;Follows one step commands with increased time(tactile cues needed, repetition)   Awareness: Intellectual Problem Solving: Requires verbal cues;Requires tactile cues;Difficulty sequencing;Slow processing        Exercises      General Comments        Pertinent Vitals/Pain Pain Assessment: Faces Faces Pain Scale: No hurt    Home Living  Prior Function            PT Goals (current goals can now be found in the care plan section) Progress towards PT goals: Progressing toward goals    Frequency    Min 4X/week      PT Plan Current plan remains appropriate    Co-evaluation PT/OT/SLP Co-Evaluation/Treatment: Yes Reason for Co-Treatment: Complexity of the patient's  impairments (multi-system involvement);Necessary to address cognition/behavior during functional activity;For patient/therapist safety;To address functional/ADL transfers PT goals addressed during session: Mobility/safety with mobility        AM-PAC PT "6 Clicks" Mobility   Outcome Measure  Help needed turning from your back to your side while in a flat bed without using bedrails?: A Lot Help needed moving from lying on your back to sitting on the side of a flat bed without using bedrails?: A Lot Help needed moving to and from a bed to a chair (including a wheelchair)?: A Lot Help needed standing up from a chair using your arms (e.g., wheelchair or bedside chair)?: A Lot Help needed to walk in hospital room?: Total Help needed climbing 3-5 steps with a railing? : Total 6 Click Score: 10    End of Session Equipment Utilized During Treatment: Gait belt Activity Tolerance: Patient limited by lethargy(confusion) Patient left: in bed;with call bell/phone within reach;with bed alarm set;with nursing/sitter in room;with restraints reapplied(bilat mittens and posey belt) Nurse Communication: Mobility status PT Visit Diagnosis: Unsteadiness on feet (R26.81);Difficulty in walking, not elsewhere classified (R26.2)     Time: 6378-5885 PT Time Calculation (min) (ACUTE ONLY): 36 min  Charges:  $Gait Training: 8-22 mins                     Earney Navy, PTA Acute Rehabilitation Services Pager: [phone] Office: [phone]     Darliss Cheney 11/17/2018, 1:08 PM

## 2018-11-17 NOTE — Progress Notes (Signed)
SLP Cancellation Note  Patient Details Name: Walter Larsen MRN: [id number] DOB: 06/28/1935   Cancelled treatment:       Reason Eval/Treat Not Completed: Other (comment);Patient declined, no reason specified. Pt was approached for speech/language/cognition evaluation. However, despite efforts, pt did not interact/engage with the SLP or respond to any of the SLP's questions. SLP will re-attempt evaluation on a subsequent date.   Afton Mikelson I. Hardin Negus, [place], [place] Office number [phone] Pager Media 11/17/2018, 3:36 PM

## 2018-11-17 NOTE — Progress Notes (Signed)
Occupational Therapy Treatment Patient Details Name: Walter Larsen MRN: [id number] DOB: 06/09/1935 Today's Date: 11/17/2018    History of present illness Patient is a 83 y/o male who presents with vision loss. Head CT-reveals multifocal acute intracerebral hemorrhages bilaterally, both posterior cerebral hemispheres, larger on the right. PMH includes stroke and CAD.   OT comments  Pt is making slow progress towards goals.  Pt limited in participation due to lethargy, confusion, and disorientation.  Pt fidgeting with mitts and frequently wiping nose with blanket or hospital gown - whatever he could grasp.  Required +2 for safety due to cognitive impairments and visual impairments.  Pt extremely restless throughout session.  Attempted sit > stand with +2, pt able to come up to partial standing x2 but unable to come into full upright stance despite +2 assistance.  Returned to supine and left with restraints in place and RN in to provide care.   Follow Up Recommendations  Supervision/Assistance - 24 hour    Equipment Recommendations  (defer to next venue of care)       Precautions / Restrictions Precautions Precautions: Fall Precaution Comments: SBP <160; cortically blind Restrictions Weight Bearing Restrictions: No       Mobility Bed Mobility Overal bed mobility: Needs Assistance Bed Mobility: Supine to Sit;Sit to Supine Rolling: Max assist;Mod assist   Supine to sit: +2 for physical assistance;HOB elevated;Max assist Sit to supine: +2 for physical assistance;Max assist   General bed mobility comments: pt assisted minimally to roll but inconsistently and requires max multimodal cues for sequencing/use of rails; +2 for assistance at bilat LE and trunk to get into sitting EOB and then for return to supine   Transfers Overall transfer level: Needs assistance Equipment used: 2 person hand held assist Transfers: Sit to/from Stand Sit to Stand: +2 physical assistance;Max assist;From  elevated surface         General transfer comment: sit to stands X 3 with pt able to come into partial stand first two trials however third attempt unable to bring buttocks off EOB; assistance to power up and to maintain balance in partial stand very briefly    Balance Overall balance assessment: Needs assistance Sitting-balance support: Feet supported;No upper extremity supported Sitting balance-Leahy Scale: Fair     Standing balance support: During functional activity Standing balance-Leahy Scale: Poor                             ADL either performed or assessed with clinical judgement   ADL Overall ADL's : Needs assistance/impaired     Grooming: Wash/dry face;Wash/dry hands;Sitting;Minimal assistance Grooming Details (indicate cue type and reason): mod cues to locate wash cloth and to utilize appropriate item as pt wiping and blowing nose in hospital gown         Upper Body Dressing : Moderate assistance Upper Body Dressing Details (indicate cue type and reason): mod assist to don hospital gown as pt with difficulty threading arms through arm holes due to impaired vision, cognition, and confusion                 Functional mobility during ADLs: +2 for physical assistance;Maximal assistance General ADL Comments: Mod-max assist for bed mobility due to difficulty with following directions and increased confusion.  Engaged in sit > stand from EOB but unsafe to transfer OOB at this time due to cognition impeding progress.     Vision   Additional Comments: Tendency to keep eyes  closed, did turn head towards bed rail on Lt and appear to "see" bedrail as he reached directly to grasp rail successfully.          Cognition Arousal/Alertness: Lethargic Behavior During Therapy: Restless;Impulsive Overall Cognitive Status: Impaired/Different from baseline Area of Impairment: Attention;Following commands;Awareness;Problem solving;Orientation;Memory                  Orientation Level: Disoriented to;Place;Time;Situation Current Attention Level: Focused Memory: Decreased short-term memory Following Commands: Follows one step commands inconsistently;Follows one step commands with increased time(tactile cues needed, repetition)   Awareness: Intellectual Problem Solving: Requires verbal cues;Requires tactile cues;Difficulty sequencing;Slow processing                     Pertinent Vitals/ Pain       Pain Assessment: Faces Faces Pain Scale: No hurt         Frequency  Min 2X/week        Progress Toward Goals  OT Goals(current goals can now be found in the care plan section)  Progress towards OT goals: Progressing toward goals  Acute Rehab OT Goals Patient Stated Goal: to lay back down OT Goal Formulation: Patient unable to participate in goal setting Time For Goal Achievement: 11/29/18 Potential to Achieve Goals: [place] Discharge plan needs to be updated    Co-evaluation    PT/OT/SLP Co-Evaluation/Treatment: Yes Reason for Co-Treatment: Complexity of the patient's impairments (multi-system involvement);Necessary to address cognition/behavior during functional activity;For patient/therapist safety;To address functional/ADL transfers PT goals addressed during session: Mobility/safety with mobility OT goals addressed during session: ADL's and self-care      AM-PAC OT "6 Clicks" Daily Activity     Outcome Measure   Help from another person eating meals?: Total Help from another person taking care of personal grooming?: Total Help from another person toileting, which includes using toliet, bedpan, or urinal?: Total Help from another person bathing (including washing, rinsing, drying)?: Total Help from another person to put on and taking off regular upper body clothing?: Total Help from another person to put on and taking off regular lower body clothing?: Total 6 Click Score: 6    End of Session Equipment Utilized  During Treatment: Gait belt  OT Visit Diagnosis: Unsteadiness on feet (R26.81);Other abnormalities of gait and mobility (R26.89);Muscle weakness (generalized) (M62.81);Other symptoms and signs involving cognitive function   Activity Tolerance Patient limited by lethargy   Patient Left in bed;with call bell/phone within reach;with bed alarm set   Nurse Communication Mobility status        Time: 7711-6579 OT Time Calculation (min): 37 min  Charges: OT General Charges $OT Visit: 1 Visit OT Treatments $Self Care/Home Management : 8-22 mins   Simonne Come, [phone] 11/17/2018, 1:51 PM

## 2018-11-18 DIAGNOSIS — E785 Hyperlipidemia, unspecified: Secondary | ICD-10-CM

## 2018-11-18 DIAGNOSIS — I10 Essential (primary) hypertension: Secondary | ICD-10-CM

## 2018-11-18 DIAGNOSIS — N400 Enlarged prostate without lower urinary tract symptoms: Secondary | ICD-10-CM

## 2018-11-18 MED ORDER — POTASSIUM CHLORIDE CRYS ER 20 MEQ PO TBCR
40.0000 meq | EXTENDED_RELEASE_TABLET | ORAL | Status: AC
  Administered 2018-11-18 (×2): 40 meq via ORAL
  Filled 2018-11-18 (×2): qty 2

## 2018-11-18 MED ORDER — AMLODIPINE BESYLATE 5 MG PO TABS
5.0000 mg | ORAL_TABLET | Freq: Every day | ORAL | Status: DC
  Administered 2018-11-19: 5 mg via ORAL
  Filled 2018-11-18: qty 1

## 2018-11-18 NOTE — Plan of Care (Signed)
Progressing towards goals

## 2018-11-18 NOTE — Progress Notes (Signed)
Physical Therapy Treatment Patient Details Name: Walter Larsen MRN: [id number] DOB: [birth year] Today's Date: 11/18/2018    History of Present Illness Patient is a 83 y/o male who presents with vision loss. Head CT-reveals multifocal acute intracerebral hemorrhages bilaterally, both posterior cerebral hemispheres, larger on the right. PMH includes stroke and CAD.    PT Comments    Patient seen for mobility progression. Pt is oriented to self only however is following single step commands consistently and not impulsive/restless this session. Pt requires min A-min A +2 for all mobility including short distance gait. Pt reports that he can see shadows, light, and shapes close to him. Appropriately conversing with staff throughout session today. Continue to progress as tolerated. Current plan remains appropriate.     Follow Up Recommendations  CIR;Supervision for mobility/OOB     Equipment Recommendations  Other (comment)(defer)    Recommendations for Other Services       Precautions / Restrictions Precautions Precautions: Fall Precaution Comments: SBP <160; cortically blind(can see shadows/light/shapes close to him) Restrictions Weight Bearing Restrictions: No    Mobility  Bed Mobility Overal bed mobility: Needs Assistance Bed Mobility: Supine to Sit     Supine to sit: Min assist     General bed mobility comments: assist to elevate trunk into sitting   Transfers Overall transfer level: Needs assistance Equipment used: 1 person hand held assist;2 person hand held assist Transfers: Sit to/from [facility] Sit to Stand: Min assist;+2 safety/equipment Stand pivot transfers: Min assist;+2 physical assistance;+2 safety/equipment       General transfer comment: pt stood from EOB with min A (+2 for safety) and able to pivot EOB to [facility] with min A +2 HHA; assist to steady and directional cues required due to vision  Ambulation/Gait Ambulation/Gait assistance: +2  physical assistance;Min assist;+2 safety/equipment Gait Distance (Feet): 6 Feet Assistive device: 2 person hand held assist Gait Pattern/deviations: Step-to pattern;Decreased step length - right;Decreased step length - left     General Gait Details: directional cues and assistance for balance; pt with short step lengths; no LOB   Stairs             Wheelchair Mobility    Modified Rankin (Stroke Patients Only) Modified Rankin (Stroke Patients Only) Pre-Morbid Rankin Score: Moderate disability Modified Rankin: Moderately severe disability     Balance Overall balance assessment: Needs assistance Sitting-balance support: Feet supported;No upper extremity supported Sitting balance-Leahy Scale: Fair     Standing balance support: During functional activity Standing balance-Leahy Scale: Poor                              Cognition Arousal/Alertness: Awake/alert Behavior During Therapy: WFL for tasks assessed/performed Overall Cognitive Status: Impaired/Different from baseline Area of Impairment: Attention;Following commands;Problem solving;Orientation;Memory                 Orientation Level: Disoriented to;Place;Time;Situation Current Attention Level: Sustained Memory: Decreased short-term memory Following Commands: Follows one step commands consistently(tactile cues needed, repetition)     Problem Solving: Requires verbal cues;Requires tactile cues;Difficulty sequencing General Comments: pt follows commands much more consistently this session and with appropriate conversation      Exercises      General Comments General comments (skin integrity, edema, etc.): pt had BM during session      Pertinent Vitals/Pain Pain Assessment: No/denies pain    Home Living  Prior Function            PT Goals (current goals can now be found in the care plan section) Progress towards PT goals: Progressing toward goals     Frequency    Min 4X/week      PT Plan Current plan remains appropriate    Co-evaluation              AM-PAC PT "6 Clicks" Mobility   Outcome Measure  Help needed turning from your back to your side while in a flat bed without using bedrails?: A Little Help needed moving from lying on your back to sitting on the side of a flat bed without using bedrails?: A Little Help needed moving to and from a bed to a chair (including a wheelchair)?: A Lot Help needed standing up from a chair using your arms (e.g., wheelchair or bedside chair)?: A Little Help needed to walk in hospital room?: A Lot Help needed climbing 3-5 steps with a railing? : A Lot 6 Click Score: 15    End of Session Equipment Utilized During Treatment: Gait belt Activity Tolerance: Patient tolerated treatment well Patient left: with call bell/phone within reach;with nursing/sitter in room;in chair Nurse Communication: Mobility status PT Visit Diagnosis: Unsteadiness on feet (R26.81);Difficulty in walking, not elsewhere classified (R26.2)     Time: 1751-0258 PT Time Calculation (min) (ACUTE ONLY): 25 min  Charges:  $Gait Training: 8-22 mins $Therapeutic Activity: 8-22 mins                     Earney Navy, PTA Acute Rehabilitation Services Pager: [phone] Office: [phone]     Darliss Cheney 11/18/2018, 9:50 AM

## 2018-11-18 NOTE — Progress Notes (Signed)
Pt did not void in 7p-7a shift, did bladder scan at 2200pm showed 415ml, removed 417ml by I&O ; also did bladder scan at 0659 showed 241ml; reported to next shift nurse RN, Delsa Sale.

## 2018-11-18 NOTE — Progress Notes (Signed)
STROKE TEAM PROGRESS NOTE   INTERVAL HISTORY Patient is stable no events overnight.  Discussed with attending Dr. Wynelle Cleveland and stroke team will sign off.  Vitals:   11/17/18 2333 11/18/18 0342 11/18/18 0733 11/18/18 1120  BP: (!) 144/83 (!) 135/98 (!) 173/87 (!) 153/73  Pulse: 95 (!) 109 71 64  Resp: 14 16 (!) 24 16  Temp: (!) 97.5 F (36.4 C) 98.1 F (36.7 C) 99.2 F (37.3 C) 98.9 F (37.2 C)  TempSrc: Oral Oral Oral Oral  SpO2: 99% 95% 96% 98%  Weight:      Height:        CBC:  Recent Labs  Lab 11/14/18 0742 11/14/18 0749 11/17/18 0449  WBC 9.9  --  12.1*  NEUTROABS 7.6  --   --   HGB 14.6 15.0 15.0  HCT 44.3 44.0 43.2  MCV 96.1  --  92.3  PLT 184  --  182    Basic Metabolic Panel:  Recent Labs  Lab 11/14/18 0742 11/14/18 0749 11/17/18 0449  NA 139 139 137  K 3.7 3.6 3.2*  CL 103 104 101  CO2 21*  --  22  GLUCOSE 137* 138* 111*  BUN 13 16 14   CREATININE 1.17 1.10 0.96  CALCIUM 10.3  --  9.0   Urine Drug Screen:     Component Value Date/Time   LABOPIA NONE DETECTED 11/14/2018 1220   COCAINSCRNUR NONE DETECTED 11/14/2018 1220   LABBENZ NONE DETECTED 11/14/2018 1220   AMPHETMU NONE DETECTED 11/14/2018 1220   THCU NONE DETECTED 11/14/2018 1220   LABBARB NONE DETECTED 11/14/2018 1220    Alcohol Level     Component Value Date/Time   ETH <10 11/14/2018 0742    IMAGING CT head wo contrast 11/14/2018 IMPRESSION: 1. Multifocal acute intra-axial hemorrhages in both posterior cerebral hemispheres, larger on the right (where collective blood volume is estimated at 55 mL). Underlying posterior right hemisphere encephalomalacia from a 2014 intra-axial hemorrhage. Additional subtle 9-10 mm hyperdense mass-like area in the posterior left cingulate. 2. Associated cerebral edema, but no midline shift or loss of basilar cisterns. No intraventricular extension. Trace extension into the subarachnoid space suspected. 3. Amyloid angiopathy is favored over hemorrhagic  metastatic disease in light of the prior 2014 bleed. 4. Underlying chronic white matter disease.  MR Brain wo contrast 11/15/2018 IMPRESSION: 1. Incomplete examination. 2. Parenchymal hemorrhages posteriorly in both cerebral hemispheres with associated small volume subarachnoid hemorrhage as described on today's CT. 3. Multiple additional chronic hemorrhages which may reflect cerebral amyloid angiopathy. 4. Punctate foci of diffusion abnormality in the left frontal lobe and left cerebellum, possibly subacute infarcts.  CT head wo contrast 11/15/2018 IMPRESSION: 1. There is redemonstrated multifocal acute parenchymal hemorrhage. A large hematoma of the right frontotemporoparietal junction is enlarged, measuring 5.4 x 3.7 cm, previously 3.8 x 3.7 cm when measured similarly (series 3, image 17). 2. An adjacent focus of hemorrhage in the right occipital lobe is not significantly changed measuring 2.2 x 1.3 cm (series 3, image 16). Hemorrhage of the left parietooccipital lobe is not significantly changed measuring 3.4 x 2.1 cm (series 3, image 13). A very small focus of hemorrhage seen on prior examination in the medial posterior left frontal lobe is not well appreciated (series 3, image 26). Distribution of multifocal parenchymal hemorrhage again suggests amyloid angiopathy. 3. There is minimal bilateral subarachnoid hemorrhage without substantial subdural collection. There is mild mass effect on the right lateral ventricle without significant midline shift. 4. Underlying small-vessel white matter  disease. Unchanged right occipital encephalomalacia.  CXR 11/14/2018 IMPRESSION: RIGHT basilar atelectasis. Atherosclerotic calcification aorta.  ECHO 11/15/2018 IMPRESSION: EF 55-60%. Increased LV wall thickness with impaired relaxation  Carotid U/S 11/15/2018 IMPRESSION: Right Carotid: Velocities in the right ICA are consistent with a 1-39% stenosis. Left Carotid: Velocities in the left ICA are  consistent with a 1-39% stenosis. Vertebrals: Bilateral vertebral arteries demonstrate antegrade flow.  EEG 11/16/2018 IMPRESSION: This study is suggestive of mild diffuse encephalopathy. No seizures or epileptiform discharges were seen throughout the recording.  PHYSICAL EXAM Gen: NAD, comfortably in bed, not following commands.  He has a Actuary at the bedside. HEENT-  [place]/AT. Neck supple  Lungs - CTAB, no increased WOB CV: RRR, no murmurs, gallops or rubs Abdomen - NT/ND Extremities - Warm, well perfused. No edema. Distal pulses 2+ Neurologic Examination: Mental Status:  Awake and sitting in chair. Fundi could not be visualized due to non-cooperation.  No facial weakness. Tongue midline.  Does not blink to threat on either side.  Interactive but confused and disoriented.  He can tell me his name and spell it, speech is slightly dysarthric but not aphasic. Follows some one-step commands.. Moving all extremities spontaneously and equally well against gravity without focal weakness.    ASSESSMENT/PLAN [name] is a 83 y.o. male with PMH of stroke 2014 with residual visual deficits, CAD s/p stenting x3, who presented with acute onset of bilateral blindness and found to have multifocal acute intra-axial hemorrhages in both posterior cerebral hemispheres, larger on the right on head CT. Underlying posterior right hemisphere encephalomalacia from a 2014 intra-axial hemorrhage. Additional subtle 9-10 mm hyperdense mass-like area in the posterior left cingulate.    Stroke: Bilateral posterior cerebral hemisphere hemorrhage likely secondary to cerebral amyloid angiopathy. Doubt metastases. CXR neg for lung nodules  Code Stroke CT head Multifocal acute intra-axial hemorrhages in both posterior cerebral hemispheres, larger on the right (where collective blood volume is estimated at 55 mL).  MRI Brain with parenchymal hemorrhages posteriorly in both cerebral hemispheres with associated small  volume subarachnoid hemorrhage as described on code stroke CT.  Carotid Doppler shows 1-39% stenosis in bilateral ICA w/ antegrade flow in bilateral vertebral arteries  EEG suggestive of mild diffuse encephalopathy but no seizures or epileptiform discharges   2D Echo shows EF 55-60%. Increased LV wall thickness with impaired relaxation  LDL 96  HgbA1c 5.8  SCDs for VTE prophylaxis  No antithrombotic prior to admission, now on No antithrombotic given hmg. Not a candidate for antithrombotics life long given likely amyloid  Therapy recommendations:  CIR  Disposition: pending   Benign Essential HTN  Home meds: Metoprolol 50 mg daily  Treated with cleviprex drip in ICU  SBP goal < 150  On norvasc 2.5, metoprolol 25 bid . Long-term BP goal normotensive  Hyperlipidemia  Home meds: Crestor 40 mg daily, fenofibrate 160 mg resumed in hospital  LDL 96, goal < 70  Continue statin at discharge  Diabetes type II Controlled  No home meds  HgbA1c 5.8, goal < 7.0  SSI not indicated at this time  Daily CBGs  Dysphasia 2  Failed swallow study on 8/2 AM  Decreased alertness/endurance during repeat study  SLP recommending dysphasia 2 (fine chop);Thin liquid  Other Stroke Risk Factors  Advanced age  Hx stroke in 2014, posterior right hemisphere encephalomalacia intra-axial hemorrhage  Coronary artery disease  Other Active Problems  Agitated and confused  Cortically blind d/t recent stroke. Pt unaware of deficit  Hospital day #  4  Discussed with Dr. Wynelle Cleveland and stroke will sign off at this time. Personally examined patient and images, and have participated in and made any corrections needed to history, physical, neuro exam,assessment and plan as stated above.  I have personally obtained the history, evaluated lab date, reviewed imaging studies and agree with radiology interpretations.    Sarina Ill, MD Stroke Neurology   A total of 25 minutes was spent for  the care of this patient, spent on counseling patient and family on different diagnostic and therapeutic options, counseling and coordination of care, riskd ans benefits of management, compliance, or risk factor reduction and education.     To contact Stroke Continuity provider, please refer to [email or web address] After hours, contact General Neurology

## 2018-11-18 NOTE — Evaluation (Signed)
Speech Language Pathology Evaluation Patient Details Name: Walter Larsen MRN: [id number] DOB: [birth year] Today's Date: 11/18/2018 Time: 4098-1191 SLP Time Calculation (min) (ACUTE ONLY): 15 min  Problem List:  Patient Active Problem List   Diagnosis Date Noted  . BPH (benign prostatic hyperplasia) 11/18/2018  . Cytotoxic brain edema ([place]) 11/15/2018  . Coronary artery disease involving native coronary artery of native heart without angina pectoris 01/10/2015  . Dyslipidemia 01/10/2015  . Cognitive deficits, late effect of cerebrovascular disease 11/07/2013  . Left homonymous hemianopsia 05/10/2013  . Presence of IVC filter 05/10/2013  . ICH (intracerebral hemorrhage) ([place]) 03/15/2013  . Dry eyes 03/08/2013  . Hyponatremia 03/08/2013  . Headache 03/08/2013  . Acute encephalopathy 03/08/2013  . Pulmonary emboli ([place]) 03/08/2013  . Other and unspecified hyperlipidemia 03/02/2013  . Bradycardia 03/02/2013  . Hypokalemia 03/02/2013  . Hemorrhagic stroke ([place]) 03/01/2013  . Essential hypertension, benign 03/01/2013  . Fever, unspecified 03/01/2013   Past Medical History:  Past Medical History:  Diagnosis Date  . Coronary artery disease    3 stents  . Stroke [facility])    Past Surgical History:  Past Surgical History:  Procedure Laterality Date  . TEE WITHOUT CARDIOVERSION N/A 03/07/2013   Procedure: TRANSESOPHAGEAL ECHOCARDIOGRAM (TEE);  Surgeon: Fay Records, MD;  Location[facility] ENDOSCOPY;  Service: Cardiovascular;  Laterality: N/A;   HPI:  Walter Larsen is an 83 y.o. male with a history of stroke 2014 with residual visual deficits, CAD s/p stenting x 3, presenting with acute onset of bilateral blindness. CT Multifocal acute intra-axial hemorrhages in both posterior cerebral hemispheres, larger on the right. Underlying posterior right hemisphere encephalomalacia from a 2014 intra-axial hemorrhage. Additional subtle 9-10 mm hyperdense mass-like area in the posterior left cingulate.    Assessment / Plan / Recommendation Clinical Impression  Pt reported that he was living independently prior to admission with his wife. He denied any baseline or acute deficits in speech, language or cognition and stated that he was managing his own medications and finances prior to admission. However, his reliability as a historian is questioned. Pt presents with moderate to severe cognitive-linguistic deficits related to memory, orientation, awareness, problem solving, attention, and reasoning. His speech and language skills were within normal limits but his cognitive-linguistic deficits related to memory and attention negatively impacted his ability to accurately follow directions. Skilled SLP services are clinically indicated at this time to improve cognition. Pt, and nursing were educated regarding results and recommendations; both parties verbalized understanding as well as agreement with plan of care.    SLP Assessment  SLP Recommendation/Assessment: Patient needs continued Speech Lanaguage Pathology Services SLP Visit Diagnosis: Cognitive communication deficit (R41.841)    Follow Up Recommendations  Inpatient Rehab    Frequency and Duration min 2x/week  2 weeks      SLP Evaluation Cognition  Overall Cognitive Status: Impaired/Different from baseline Arousal/Alertness: Awake/alert Orientation Level: Oriented to person;Disoriented to place;Disoriented to time;Disoriented to situation(Per pt: Month: March, Location: Shop; City: [place]) Attention: Focused;Sustained Focused Attention: Impaired Focused Attention Impairment: Verbal complex Sustained Attention: Impaired Sustained Attention Impairment: Verbal complex Memory: Impaired Memory Impairment: Storage deficit;Decreased recall of new information;Retrieval deficit(Immediate: 2/3; with cue: 1/1; Delayed: 0/3) Awareness: Impaired Awareness Impairment: Intellectual impairment Problem Solving: Impaired Problem Solving Impairment:  Verbal complex;Verbal basic       Comprehension  Auditory Comprehension Overall Auditory Comprehension: Appears within functional limits for tasks assessed Yes/No Questions: Within Functional Limits Basic Biographical Questions: (5/5) Complex Questions: (4/5) Commands: Impaired One Step Basic Commands: (  3/3) Two Step Basic Commands: (2/4) Conversation: Simple Interfering Components: Attention;Working memory;Processing speed;Hearing EffectiveTechniques: Repetition;Increased volume;Extra processing time Visual Recognition/Discrimination Discrimination: Not tested Reading Comprehension Reading Status: Not tested    Expression Expression Primary Mode of Expression: Verbal Verbal Expression Overall Verbal Expression: Appears within functional limits for tasks assessed Initiation: No impairment Automatic Speech: Counting;Day of week;Month of year(Counting: 10/10; days: 5/7; Months: 6/13) Level of Generative/Spontaneous Verbalization: Sentence Naming: No impairment   Oral / Motor  Oral Motor/Sensory Function Overall Oral Motor/Sensory Function: Within functional limits Motor Speech Overall Motor Speech: Appears within functional limits for tasks assessed Respiration: Within functional limits Phonation: Normal Resonance: Within functional limits Articulation: Within functional limitis Intelligibility: Intelligible Motor Planning: Witnin functional limits Motor Speech Errors: Not applicable   Walter Larsen I. Hardin Negus, [place], [place] Office number [phone] Pager [place] 11/18/2018, 3:40 PM

## 2018-11-18 NOTE — Progress Notes (Signed)
Inpatient Rehabilitation-Admissions Coordinator   Pt's request for CIR has been denied by insurance. Dr. Wynelle Cleveland has agreed to complete peer to peer as an attempt to overturn the decision. AC has set up peer to peer conference. Will follow up once final insurance determination has been made.   Please call if questions.   Jhonnie Garner, OTR/L  Rehab Admissions Coordinator  [phone] 11/18/2018 1:32 PM

## 2018-11-18 NOTE — Progress Notes (Addendum)
PROGRESS NOTE    TACOMA MERIDA   WEX:[id number]  DOB: 02-05-1936  DOA: 11/14/2018 PCP: Greig Right, MD   Brief Narrative:  Walter Larsen is an 83 y/o male with CAD s/p stenting, ICH in 2014 who was brought in by family who noted confusion and acting as if he could not see properly. He had without contrast 8/2: 1. Multifocal acute intra-axial hemorrhages in both posterior cerebral hemispheres, larger on the right (where collective blood volume is estimated at 55 mL). Underlying posterior right hemisphere encephalomalacia from a 2014 intra-axial hemorrhage. Additional subtle 9-10 mm hyperdense mass-like area in the posterior left cingulate. 2. Associated cerebral edema, but no midline shift or loss of basilar cisterns. No intraventricular extension. Trace extension into the subarachnoid space suspected. 3. 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   He was admitted by neurology to ICU.     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) -MRI brain- parenchymal hemorrhages posteriorly in both cerebral hemisphere with a small volume subarachnoid hemorrhage and multiple additional chronic hemorrhages which may reflect cerebral amyloid angiopathy, punctate foci of diffusion abnormality in the left frontal lobe and left cerebellum possibly subacute infarcts -Carotid ultrasound 8/3- no significant stenosis noted - EEG 8/4- mild diffuse encephalopathy-no seizures or epileptiform activity -2D echo- no embolic source found - LDL is 96 triglycerides are also elevated at 168 and HDL is 40 -A1c is 5.8 -He has been placed on a dysphagia 2 diet with thin liquids which he is tolerating well - CIR is evaluating him - his insurance has requested a peer to peer with I have performed today- [facility] has given approval for CIR and I have touched base  with Claiborne Billings , the coordinator who feels they will have a bed tomorrow -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 -Continue to avoid all blood thinners  Dysphagia -See above  HTN -  Relaxation noted on 2D echo suggestive of diastolic heart failure and the need for better BP control - takes Toprol XL at home and is currently on metoprolol twice daily -He is also receiving amlodipine 2.5 mg daily which I will increase as BP is still elevated - He is also on PRN IV labetalol and hydralazine  BPH - on Flomax at home-he did need an in and out cath during this hospital stay and we are continuing to monitor whether he is retaining urine  HLD -LDL is 96- he was on Crestor and fenofibrate at home which we are continuing   Sirs - Mild temperature of 100.7 yesterday afternoon has not recurred-WBC count is 12 --continue to follow  Hypokalemia - Have replaced this  Time spent in minutes: 45 minutes- Case discussed with CIR coordinator, Airline pilot physician advisor, neurologist and neurology PA DVT prophylaxis: SCDs Code Status: Full code Family Communication:  Disposition Plan: being evaluated by CIR Consultants:   Neurology Procedures:   2D echo 1. The left ventricle has normal systolic function, with an ejection fraction of 55-60%. The cavity size was normal. There is moderately increased left ventricular wall thickness. Left ventricular diastolic Doppler parameters are consistent with  impaired relaxation. No evidence of left ventricular regional wall motion abnormalities.  2. The right ventricle has normal systolic function. The cavity was normal. There is no increase in right ventricular wall thickness.  3. Trivial pericardial effusion is present.  4.  No evidence of mitral valve stenosis. Trivial mitral regurgitation.  5. The aortic valve is tricuspid. Moderate calcification of the aortic valve. No stenosis of the aortic valve.  6. The aorta is normal in size and structure.   7. The IVC was normal in size. No complete TR doppler jet so unable to estimate PA systolic pressure.   Antimicrobials:  Anti-infectives (From admission, onward)   None       Objective: Vitals:   11/17/18 1945 11/17/18 2333 11/18/18 0342 11/18/18 0733  BP: (!) 153/89 (!) 144/83 (!) 135/98 (!) 173/87  Pulse: 83 95 (!) 109 71  Resp: 15 14 16  (!) 24  Temp: (!) 100.7 F (38.2 C) (!) 97.5 F (36.4 C) 98.1 F (36.7 C) 99.2 F (37.3 C)  TempSrc: Oral Oral Oral Oral  SpO2: 100% 99% 95% 96%  Weight:      Height:        Intake/Output Summary (Last 24 hours) at 11/18/2018 0952 Last data filed at 11/18/2018 0846 Gross per 24 hour  Intake 1149.97 ml  Output 1150 ml  Net -0.03 ml   Filed Weights   11/14/18 1255  Weight: 84.9 kg    Examination: General exam: Appears comfortable  HEENT: PERRLA, oral mucosa moist, no sclera icterus or thrush Respiratory system: Clear to auscultation. Respiratory effort normal. Cardiovascular system: S1 & S2 heard, RRR.   Gastrointestinal system: Abdomen soft, non-tender, nondistended. Normal bowel sounds. Central nervous system: Alert and oriented only to person- he states his vision is severely blurred- moves all 4 extremities normally Extremities: No cyanosis, clubbing or edema Skin: No rashes or ulcers Psychiatry:  Mood & affect appropriate.     Data Reviewed: I have personally reviewed following labs and imaging studies  CBC: Recent Labs  Lab 11/14/18 0742 11/14/18 0749 11/17/18 0449  WBC 9.9  --  12.1*  NEUTROABS 7.6  --   --   HGB 14.6 15.0 15.0  HCT 44.3 44.0 43.2  MCV 96.1  --  92.3  PLT 184  --  595   Basic Metabolic Panel: Recent Labs  Lab 11/14/18 0742 11/14/18 0749 11/17/18 0449  NA 139 139 137  K 3.7 3.6 3.2*  CL 103 104 101  CO2 21*  --  22  GLUCOSE 137* 138* 111*  BUN 13 16 14   CREATININE 1.17 1.10 0.96  CALCIUM 10.3  --  9.0   GFR: Estimated Creatinine Clearance: 67.4 mL/min (by C-G formula based on SCr of  0.96 mg/dL). Liver Function Tests: Recent Labs  Lab 11/14/18 0742  AST 46*  ALT 42  ALKPHOS 44  BILITOT 0.9  PROT 7.6  ALBUMIN 4.4   No results for input(s): LIPASE, AMYLASE in the last 168 hours. No results for input(s): AMMONIA in the last 168 hours. Coagulation Profile: Recent Labs  Lab 11/14/18 0742  INR 1.2   Cardiac Enzymes: No results for input(s): CKTOTAL, CKMB, CKMBINDEX, TROPONINI in the last 168 hours. BNP (last 3 results) No results for input(s): PROBNP in the last 8760 hours. HbA1C: No results for input(s): HGBA1C in the last 72 hours. CBG: No results for input(s): GLUCAP in the last 168 hours. Lipid Profile: Recent Labs    11/16/18 0815  TRIG 128   Thyroid Function Tests: No results for input(s): TSH, T4TOTAL, FREET4, T3FREE, THYROIDAB in the last 72 hours. Anemia Panel: No results for input(s): VITAMINB12, FOLATE, FERRITIN, TIBC, IRON, RETICCTPCT in the last 72 hours. Urine analysis:    Component Value Date/Time  COLORURINE STRAW (A) 11/14/2018 1220   APPEARANCEUR CLEAR 11/14/2018 1220   LABSPEC 1.009 11/14/2018 1220   PHURINE 7.0 11/14/2018 1220   GLUCOSEU NEGATIVE 11/14/2018 1220   HGBUR NEGATIVE 11/14/2018 1220   BILIRUBINUR NEGATIVE 11/14/2018 1220   KETONESUR NEGATIVE 11/14/2018 1220   PROTEINUR NEGATIVE 11/14/2018 1220   UROBILINOGEN 0.2 03/24/2013 1300   NITRITE NEGATIVE 11/14/2018 1220   LEUKOCYTESUR NEGATIVE 11/14/2018 1220   Sepsis Labs: @LABRCNTIP (procalcitonin:4,lacticidven:4) ) Recent Results (from the past 240 hour(s))  SARS Coronavirus 2 [facility] order, Performed in [facility] hospital lab) Nasopharyngeal Nasopharyngeal Swab     Status: None   Collection Time: 11/14/18 12:07 PM   Specimen: Nasopharyngeal Swab  Result Value Ref Range Status   SARS Coronavirus 2 NEGATIVE NEGATIVE Final    Comment: (NOTE) If result is NEGATIVE SARS-CoV-2 target nucleic acids are NOT DETECTED. The SARS-CoV-2 RNA is generally detectable in  upper and lower  respiratory specimens during the acute phase of infection. The lowest  concentration of SARS-CoV-2 viral copies this assay can detect is 250  copies / mL. A negative result does not preclude SARS-CoV-2 infection  and should not be used as the sole basis for treatment or other  patient management decisions.  A negative result may occur with  improper specimen collection / handling, submission of specimen other  than nasopharyngeal swab, presence of viral mutation(s) within the  areas targeted by this assay, and inadequate number of viral copies  (<250 copies / mL). A negative result must be combined with clinical  observations, patient history, and epidemiological information. If result is POSITIVE SARS-CoV-2 target nucleic acids are DETECTED. The SARS-CoV-2 RNA is generally detectable in upper and lower  respiratory specimens dur ing the acute phase of infection.  Positive  results are indicative of active infection with SARS-CoV-2.  Clinical  correlation with patient history and other diagnostic information is  necessary to determine patient infection status.  Positive results do  not rule out bacterial infection or co-infection with other viruses. If result is PRESUMPTIVE POSTIVE SARS-CoV-2 nucleic acids MAY BE PRESENT.   A presumptive positive result was obtained on the submitted specimen  and confirmed on repeat testing.  While 2019 novel coronavirus  (SARS-CoV-2) nucleic acids may be present in the submitted sample  additional confirmatory testing may be necessary for epidemiological  and / or clinical management purposes  to differentiate between  SARS-CoV-2 and other Sarbecovirus currently known to infect humans.  If clinically indicated additional testing with an alternate test  methodology [phone]) is advised. The SARS-CoV-2 RNA is generally  detectable in upper and lower respiratory sp ecimens during the acute  phase of infection. The expected result is  Negative. Fact Sheet for Patients:  StrictlyIdeas.no Fact Sheet for Healthcare Providers: BankingDealers.co.za This test is not yet approved or cleared by the [place] FDA and has been authorized for detection and/or diagnosis of SARS-CoV-2 by FDA under an Emergency Use Authorization (EUA).  This EUA will remain in effect (meaning this test can be used) for the duration of the COVID-19 declaration under Section 564(b)(1) of the Act, 21 U.S.C. section 360bbb-3(b)(1), unless the authorization is terminated or revoked sooner. Performed at [facility], [place] [address], [place], [place] [address]   MRSA PCR Screening     Status: None   Collection Time: 11/14/18  1:10 PM   Specimen: Nasal Mucosa; Nasopharyngeal  Result Value Ref Range Status   MRSA by PCR NEGATIVE NEGATIVE Final    Comment:  The GeneXpert MRSA Assay (FDA approved for NASAL specimens only), is one component of a comprehensive MRSA colonization surveillance program. It is not intended to diagnose MRSA infection nor to guide or monitor treatment for MRSA infections. Performed at [facility], [place] [address] [place], [place] [address]          Radiology Studies: No results found.    Scheduled Meds:  amLODipine  2.5 mg Oral Daily   divalproex  250 mg Oral Q8H   fenofibrate  160 mg Oral Daily   mouth rinse  15 mL Mouth Rinse BID   metoprolol tartrate  25 mg Oral BID   pantoprazole  40 mg Oral QHS   senna-docusate  1 tablet Oral BID   tamsulosin  0.4 mg Oral QPC breakfast   Continuous Infusions:  sodium chloride 50 mL/hr at 11/17/18 1614     LOS: 4 days      Debbe Odea, MD [facility] Pager: [email or web address] Password [email or web address] 11/18/2018, 9:52 AM

## 2018-11-19 ENCOUNTER — Encounter (HOSPITAL_COMMUNITY): Payer: Self-pay | Admitting: Physical Medicine and Rehabilitation

## 2018-11-19 ENCOUNTER — Other Ambulatory Visit: Payer: Self-pay

## 2018-11-19 ENCOUNTER — Inpatient Hospital Stay (HOSPITAL_COMMUNITY)
Admission: RE | Admit: 2018-11-19 | Discharge: 2018-12-06 | DRG: 056 | Disposition: A | Discharge status: Hospice | Payer: Medicare HMO | Source: Intra-hospital | Attending: Physical Medicine & Rehabilitation | Admitting: Physical Medicine & Rehabilitation

## 2018-11-19 DIAGNOSIS — S06320A Contusion and laceration of left cerebrum without loss of consciousness, initial encounter: Secondary | ICD-10-CM | POA: Diagnosis not present

## 2018-11-19 DIAGNOSIS — R5383 Other fatigue: Secondary | ICD-10-CM

## 2018-11-19 DIAGNOSIS — Z86718 Personal history of other venous thrombosis and embolism: Secondary | ICD-10-CM

## 2018-11-19 DIAGNOSIS — R1310 Dysphagia, unspecified: Secondary | ICD-10-CM | POA: Diagnosis not present

## 2018-11-19 DIAGNOSIS — H47611 Cortical blindness, right side of brain: Secondary | ICD-10-CM | POA: Diagnosis present

## 2018-11-19 DIAGNOSIS — E854 Organ-limited amyloidosis: Secondary | ICD-10-CM | POA: Diagnosis present

## 2018-11-19 DIAGNOSIS — R1312 Dysphagia, oropharyngeal phase: Secondary | ICD-10-CM | POA: Diagnosis present

## 2018-11-19 DIAGNOSIS — R410 Disorientation, unspecified: Secondary | ICD-10-CM

## 2018-11-19 DIAGNOSIS — Z7189 Other specified counseling: Secondary | ICD-10-CM | POA: Diagnosis not present

## 2018-11-19 DIAGNOSIS — R69 Illness, unspecified: Secondary | ICD-10-CM | POA: Diagnosis not present

## 2018-11-19 DIAGNOSIS — D72829 Elevated white blood cell count, unspecified: Secondary | ICD-10-CM | POA: Diagnosis present

## 2018-11-19 DIAGNOSIS — K219 Gastro-esophageal reflux disease without esophagitis: Secondary | ICD-10-CM | POA: Diagnosis not present

## 2018-11-19 DIAGNOSIS — I69198 Other sequelae of nontraumatic intracerebral hemorrhage: Principal | ICD-10-CM

## 2018-11-19 DIAGNOSIS — I10 Essential (primary) hypertension: Secondary | ICD-10-CM | POA: Diagnosis present

## 2018-11-19 DIAGNOSIS — S06310A Contusion and laceration of right cerebrum without loss of consciousness, initial encounter: Secondary | ICD-10-CM | POA: Diagnosis not present

## 2018-11-19 DIAGNOSIS — E785 Hyperlipidemia, unspecified: Secondary | ICD-10-CM | POA: Diagnosis not present

## 2018-11-19 DIAGNOSIS — I610 Nontraumatic intracerebral hemorrhage in hemisphere, subcortical: Secondary | ICD-10-CM

## 2018-11-19 DIAGNOSIS — I680 Cerebral amyloid angiopathy: Secondary | ICD-10-CM | POA: Diagnosis present

## 2018-11-19 DIAGNOSIS — R0602 Shortness of breath: Secondary | ICD-10-CM

## 2018-11-19 DIAGNOSIS — Z66 Do not resuscitate: Secondary | ICD-10-CM | POA: Diagnosis present

## 2018-11-19 DIAGNOSIS — J90 Pleural effusion, not elsewhere classified: Secondary | ICD-10-CM | POA: Diagnosis not present

## 2018-11-19 DIAGNOSIS — D72823 Leukemoid reaction: Secondary | ICD-10-CM | POA: Diagnosis not present

## 2018-11-19 DIAGNOSIS — R451 Restlessness and agitation: Secondary | ICD-10-CM | POA: Diagnosis present

## 2018-11-19 DIAGNOSIS — I6200 Nontraumatic subdural hemorrhage, unspecified: Secondary | ICD-10-CM | POA: Diagnosis not present

## 2018-11-19 DIAGNOSIS — G9349 Other encephalopathy: Secondary | ICD-10-CM | POA: Diagnosis present

## 2018-11-19 DIAGNOSIS — H47612 Cortical blindness, left side of brain: Secondary | ICD-10-CM | POA: Diagnosis present

## 2018-11-19 DIAGNOSIS — R402421 Glasgow coma scale score 9-12, in the field [EMT or ambulance]: Secondary | ICD-10-CM | POA: Diagnosis not present

## 2018-11-19 DIAGNOSIS — I611 Nontraumatic intracerebral hemorrhage in hemisphere, cortical: Secondary | ICD-10-CM | POA: Diagnosis not present

## 2018-11-19 DIAGNOSIS — H47619 Cortical blindness, unspecified side of brain: Secondary | ICD-10-CM

## 2018-11-19 DIAGNOSIS — Z515 Encounter for palliative care: Secondary | ICD-10-CM | POA: Diagnosis present

## 2018-11-19 DIAGNOSIS — N400 Enlarged prostate without lower urinary tract symptoms: Secondary | ICD-10-CM | POA: Diagnosis not present

## 2018-11-19 DIAGNOSIS — I2510 Atherosclerotic heart disease of native coronary artery without angina pectoris: Secondary | ICD-10-CM | POA: Diagnosis present

## 2018-11-19 DIAGNOSIS — I7090 Unspecified atherosclerosis: Secondary | ICD-10-CM | POA: Diagnosis not present

## 2018-11-19 DIAGNOSIS — Z8249 Family history of ischemic heart disease and other diseases of the circulatory system: Secondary | ICD-10-CM

## 2018-11-19 DIAGNOSIS — Z955 Presence of coronary angioplasty implant and graft: Secondary | ICD-10-CM

## 2018-11-19 DIAGNOSIS — R339 Retention of urine, unspecified: Secondary | ICD-10-CM | POA: Diagnosis present

## 2018-11-19 DIAGNOSIS — I69191 Dysphagia following nontraumatic intracerebral hemorrhage: Secondary | ICD-10-CM

## 2018-11-19 DIAGNOSIS — Z7401 Bed confinement status: Secondary | ICD-10-CM | POA: Diagnosis not present

## 2018-11-19 DIAGNOSIS — G936 Cerebral edema: Secondary | ICD-10-CM | POA: Diagnosis present

## 2018-11-19 DIAGNOSIS — M2550 Pain in unspecified joint: Secondary | ICD-10-CM | POA: Diagnosis not present

## 2018-11-19 DIAGNOSIS — I619 Nontraumatic intracerebral hemorrhage, unspecified: Secondary | ICD-10-CM | POA: Diagnosis present

## 2018-11-19 DIAGNOSIS — I612 Nontraumatic intracerebral hemorrhage in hemisphere, unspecified: Secondary | ICD-10-CM | POA: Diagnosis not present

## 2018-11-19 LAB — BASIC METABOLIC PANEL
Anion gap: 13 (ref 5–15)
BUN: 9 mg/dL (ref 8–23)
CO2: 19 mmol/L — ABNORMAL LOW (ref 22–32)
Calcium: 8.9 mg/dL (ref 8.9–10.3)
Chloride: 103 mmol/L (ref 98–111)
Creatinine, Ser: 0.86 mg/dL (ref 0.61–1.24)
GFR calc Af Amer: >60 mL/min (ref >60)
GFR calc non Af Amer: >60 mL/min (ref >60)
Glucose, Bld: 100 mg/dL — ABNORMAL HIGH (ref 70–99)
Potassium: 3.7 mmol/L (ref 3.5–5.1)
Sodium: 135 mmol/L (ref 135–145)

## 2018-11-19 MED ORDER — DIVALPROEX SODIUM 125 MG PO CSDR
250.0000 mg | DELAYED_RELEASE_CAPSULE | Freq: Three times a day (TID) | ORAL | Status: DC
  Administered 2018-11-19 – 2018-11-21 (×6): 250 mg via ORAL
  Filled 2018-11-19 (×7): qty 2

## 2018-11-19 MED ORDER — PROCHLORPERAZINE 25 MG RE SUPP: 12.5000 mg | Freq: Four times a day (QID) | RECTAL | Status: DC | PRN

## 2018-11-19 MED ORDER — AMLODIPINE BESYLATE 5 MG PO TABS: 5.0000 mg | ORAL_TABLET | Freq: Every day | ORAL | Status: DC

## 2018-11-19 MED ORDER — BISACODYL 10 MG RE SUPP
10.0000 mg | Freq: Every day | RECTAL | Status: DC | PRN
  Administered 2018-11-27: 12:00:00 10 mg via RECTAL
  Filled 2018-11-19: qty 1

## 2018-11-19 MED ORDER — ENSURE ENLIVE PO LIQD
237.0000 mL | Freq: Two times a day (BID) | ORAL | Status: DC
  Administered 2018-11-19 – 2018-11-30 (×12): 237 mL via ORAL

## 2018-11-19 MED ORDER — DIVALPROEX SODIUM 125 MG PO CSDR: 250.0000 mg | DELAYED_RELEASE_CAPSULE | Freq: Three times a day (TID) | ORAL | Status: DC

## 2018-11-19 MED ORDER — PROCHLORPERAZINE MALEATE 5 MG PO TABS: 5.0000 mg | ORAL_TABLET | Freq: Four times a day (QID) | ORAL | Status: DC | PRN

## 2018-11-19 MED ORDER — ACETAMINOPHEN 325 MG PO TABS
325.0000 mg | ORAL_TABLET | ORAL | Status: DC | PRN
  Administered 2018-11-21 – 2018-11-30 (×3): 650 mg via ORAL
  Administered 2018-11-30: 21:00:00 325 mg via ORAL
  Administered 2018-12-03 – 2018-12-06 (×3): 650 mg via ORAL
  Filled 2018-11-19 (×10): qty 2

## 2018-11-19 MED ORDER — DIPHENHYDRAMINE HCL 50 MG/ML IJ SOLN
25.0000 mg | Freq: Once | INTRAMUSCULAR | Status: AC
  Administered 2018-11-19: 25 mg via INTRAVENOUS
  Filled 2018-11-19: qty 1

## 2018-11-19 MED ORDER — LIDOCAINE HCL URETHRAL/MUCOSAL 2 % EX GEL: CUTANEOUS | Status: DC | PRN

## 2018-11-19 MED ORDER — METOPROLOL TARTRATE 25 MG PO TABS: 25.0000 mg | ORAL_TABLET | Freq: Two times a day (BID) | ORAL | Status: DC

## 2018-11-19 MED ORDER — POLYETHYLENE GLYCOL 3350 17 G PO PACK
17.0000 g | PACK | Freq: Every day | ORAL | Status: DC | PRN
  Administered 2018-11-26: 22:00:00 17 g via ORAL
  Filled 2018-11-19: qty 1

## 2018-11-19 MED ORDER — LABETALOL HCL 5 MG/ML IV SOLN
20.0000 mg | INTRAVENOUS | Status: DC | PRN
  Filled 2018-11-19: qty 4

## 2018-11-19 MED ORDER — TAMSULOSIN HCL 0.4 MG PO CAPS
0.4000 mg | ORAL_CAPSULE | Freq: Every day | ORAL | Status: DC
  Administered 2018-11-20 – 2018-12-05 (×12): 0.4 mg via ORAL
  Filled 2018-11-19 (×14): qty 1

## 2018-11-19 MED ORDER — AMLODIPINE BESYLATE 5 MG PO TABS
5.0000 mg | ORAL_TABLET | Freq: Every day | ORAL | Status: DC
  Administered 2018-11-20 – 2018-11-24 (×4): 5 mg via ORAL
  Filled 2018-11-19 (×5): qty 1

## 2018-11-19 MED ORDER — GUAIFENESIN-DM 100-10 MG/5ML PO SYRP: 5.0000 mL | ORAL_SOLUTION | Freq: Four times a day (QID) | ORAL | Status: DC | PRN

## 2018-11-19 MED ORDER — FENOFIBRATE 160 MG PO TABS
160.0000 mg | ORAL_TABLET | Freq: Every day | ORAL | Status: DC
  Administered 2018-11-20 – 2018-12-04 (×12): 160 mg via ORAL
  Filled 2018-11-19 (×17): qty 1

## 2018-11-19 MED ORDER — PANTOPRAZOLE SODIUM 40 MG PO TBEC
40.0000 mg | DELAYED_RELEASE_TABLET | Freq: Every day | ORAL | Status: DC
  Administered 2018-11-19 – 2018-11-20 (×2): 40 mg via ORAL
  Filled 2018-11-19 (×3): qty 1

## 2018-11-19 MED ORDER — SENNOSIDES-DOCUSATE SODIUM 8.6-50 MG PO TABS: 1.0000 | ORAL_TABLET | Freq: Two times a day (BID) | ORAL | Status: AC

## 2018-11-19 MED ORDER — SENNOSIDES-DOCUSATE SODIUM 8.6-50 MG PO TABS
1.0000 | ORAL_TABLET | Freq: Two times a day (BID) | ORAL | Status: DC
  Administered 2018-11-19 – 2018-12-05 (×28): 1 via ORAL
  Filled 2018-11-19 (×34): qty 1

## 2018-11-19 MED ORDER — FLEET ENEMA 7-19 GM/118ML RE ENEM: 1.0000 | ENEMA | Freq: Once | RECTAL | Status: DC | PRN

## 2018-11-19 MED ORDER — METOPROLOL TARTRATE 25 MG PO TABS
25.0000 mg | ORAL_TABLET | Freq: Two times a day (BID) | ORAL | Status: DC
  Administered 2018-11-19 – 2018-12-05 (×29): 25 mg via ORAL
  Filled 2018-11-19 (×34): qty 1

## 2018-11-19 MED ORDER — ALUM & MAG HYDROXIDE-SIMETH 200-200-20 MG/5ML PO SUSP: 30.0000 mL | ORAL | Status: DC | PRN

## 2018-11-19 MED ORDER — PROCHLORPERAZINE EDISYLATE 10 MG/2ML IJ SOLN: 5.0000 mg | Freq: Four times a day (QID) | INTRAMUSCULAR | Status: DC | PRN

## 2018-11-19 MED ORDER — QUETIAPINE FUMARATE 50 MG PO TABS
50.0000 mg | ORAL_TABLET | Freq: Every day | ORAL | Status: DC
  Administered 2018-11-19 – 2018-11-22 (×4): 50 mg via ORAL
  Filled 2018-11-19 (×4): qty 1

## 2018-11-19 MED ORDER — ORAL CARE MOUTH RINSE
15.0000 mL | Freq: Two times a day (BID) | OROMUCOSAL | Status: DC
  Administered 2018-11-19 – 2018-12-06 (×22): 15 mL via OROMUCOSAL

## 2018-11-19 MED ORDER — DIPHENHYDRAMINE HCL 12.5 MG/5ML PO ELIX: 12.5000 mg | ORAL_SOLUTION | Freq: Four times a day (QID) | ORAL | Status: DC | PRN

## 2018-11-19 MED ORDER — QUETIAPINE FUMARATE 25 MG PO TABS: 25.0000 mg | ORAL_TABLET | Freq: Every day | ORAL | Status: DC

## 2018-11-19 MED ORDER — TRAZODONE HCL 50 MG PO TABS: 25.0000 mg | ORAL_TABLET | Freq: Every evening | ORAL | Status: DC | PRN

## 2018-11-19 MED ORDER — QUETIAPINE FUMARATE 50 MG PO TABS: 50.0000 mg | ORAL_TABLET | Freq: Two times a day (BID) | ORAL | Status: DC | PRN

## 2018-11-19 NOTE — Plan of Care (Signed)
Progressing

## 2018-11-19 NOTE — TOC Transition Note (Signed)
Transition of Care [facility]) - CM/SW Discharge Note   Patient Details  Name: Walter Larsen MRN: [id number] Date of Birth: Oct 04, 1935  Transition of Care [facility]) CM/SW Contact:  Pollie Friar, RN Phone Number: 11/19/2018, 12:58 PM   Clinical Narrative:    Pt discharging to CIR today. CM signing off.    Final next level of care: IP Rehab Facility Barriers to Discharge: No Barriers Identified   Patient Goals and CMS Choice        Discharge Placement                       Discharge Plan and Services                                     Social Determinants of Health (SDOH) Interventions     Readmission Risk Interventions No flowsheet data found.

## 2018-11-19 NOTE — Progress Notes (Signed)
Pt has been voiding many times in this shift, but c/o feeling painful voiding, did not rest well eventhough with prn meds, will continue to monitor.

## 2018-11-19 NOTE — Progress Notes (Signed)
Patient admitted to IP rehab during shift. Family isnt at bedside at time, but wife [name] was contacted and informed about rehab safety plan and hospital visitor policy.  Patient oriented to self only, in enclosure bed resting at time. Call light within reach. Adria Devon, LPN

## 2018-11-19 NOTE — Plan of Care (Signed)
Progressing towards goals

## 2018-11-19 NOTE — Evaluation (Signed)
Physical Therapy Assessment and Plan  Patient Details  Name: Walter Larsen MRN: [id number] Date of Birth: 1935/05/11  PT Diagnosis: Abnormal posture, Cognitive deficits, Difficulty walking, Impaired cognition and Muscle weakness Rehab Potential: Fair ELOS: 14-16 days   Today's Date: 11/20/2018 PT Individual Time: 1100-1145 AND 1405-1430 PT Individual Time Calculation (min): 45 min AND 25 min   Problem List:  Patient Active Problem List   Diagnosis Date Noted  . Confusion 11/19/2018  . Dysphagia 11/19/2018  . 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 BPH (benign prostatic hyperplasia) 11/18/2018  . 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 Coronary artery disease involving native coronary artery of native heart without angina pectoris 01/10/2015  . Dyslipidemia 01/10/2015  . Cognitive deficits, late effect of cerebrovascular disease 11/07/2013  . Left homonymous hemianopsia 05/10/2013  . Presence of IVC filter 05/10/2013  . ICH (intracerebral hemorrhage) ([place]) 03/15/2013  . Dry eyes 03/08/2013  . Hyponatremia 03/08/2013  . Headache 03/08/2013  . Acute encephalopathy 03/08/2013  . Pulmonary emboli ([place]) 03/08/2013  . Other and unspecified hyperlipidemia 03/02/2013  . Bradycardia 03/02/2013  . Hypokalemia 03/02/2013  . Hemorrhagic stroke ([place]) 03/01/2013  . Essential hypertension, benign 03/01/2013  . Fever, unspecified 03/01/2013    Past Medical History:  Past Medical History:  Diagnosis Date  . Coronary artery disease    3 stents  . ICH (intracerebral hemorrhage) ([place]) 2014   Past Surgical History:  Past Surgical History:  Procedure Laterality Date  . TEE WITHOUT CARDIOVERSION N/A 03/07/2013   Procedure: TRANSESOPHAGEAL ECHOCARDIOGRAM (TEE);  Surgeon: Fay Records, MD;  Location: [facility] ENDOSCOPY;  Service: Cardiovascular;  Laterality: N/A;    Assessment & Plan Clinical Impression: Patient is a 83 yo M with history of CAD s/p sent x3, and an old [place] in 2014 with residual visual  deficit. Pt presented to the hospital with an acute onset of bilateral blindness and mild confusion. CT showed multifocal acute intracerebral hemorrhages bilaterally. 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 Underlying chronic white matter disease was also noted. Pt was admitted to neuro ICU. Carotid dopplers showed 1-39% stenosis in bilateral ICA. 2D echo showed ER 55-60%. Pt failed a swallow study on 8/2. Pt recommended for DYS 2, thin liquids. Pt has been confused and disoriented requiring haldol. EEG showed some mild diffuse encephalopathy but no seizure or epileptiform discharges. Pt has been weaned off Cleviprex drip. Pt has been evaluated by therapies with recommendation for CIR.Patient transferred to CIR on 11/19/2018 .   Patient currently requires total with mobility secondary to muscle weakness, decreased cardiorespiratoy endurance, motor apraxia, decreased coordination and decreased motor planning, decreased visual acuity and decreased visual perceptual skills, decreased initiation, decreased attention, decreased awareness, decreased problem solving, decreased safety awareness, decreased memory and delayed processing and decreased sitting balance, decreased standing balance, decreased postural control and decreased balance strategies.  Prior to hospitalization, patient was independent  with mobility and lived with Spouse in a House home.  Home access is Per chart review, has ramp but also has 4 steps w/ B railsStairs to enter, Ramped entrance.  Patient will benefit from skilled PT intervention to maximize safe functional mobility, minimize fall risk and decrease caregiver burden for planned discharge home with 24 hour assist.  Anticipate patient will benefit from [facility] at discharge.  PT - End of Session Activity Tolerance: Tolerates < 10 min activity, no significant change in vital signs Endurance Deficit: Yes Endurance Deficit Description: decreased PT  Assessment Rehab Potential (ACUTE/IP ONLY): Fair  PT Barriers to Discharge: Other (comments);Behavior PT Barriers to Discharge Comments: new cortical blindness PT Patient demonstrates impairments in the following area(s): Balance;Behavior;Endurance;Motor;Perception;Safety PT Transfers Functional Problem(s): Bed Mobility;Bed to Chair;Car;Furniture;Floor PT Locomotion Functional Problem(s): Ambulation;Stairs(anticipate that he will not be functionally able to use w/c 2/2 cognitive deficits) PT Plan PT Intensity: Minimum of 1-2 x/day ,45 to 90 minutes PT Frequency: 5 out of 7 days PT Duration Estimated Length of Stay: 14-16 days PT Treatment/Interventions: Ambulation/gait training;Discharge planning;Cognitive remediation/compensation;DME/adaptive equipment instruction;Functional mobility training;Pain management;Psychosocial support;Splinting/orthotics;Therapeutic Activities;UE/LE Strength taining/ROM;Visual/perceptual remediation/compensation;UE/LE Coordination activities;Skin care/wound management;Stair training;Therapeutic Exercise;Patient/family education;Neuromuscular re-education;Functional electrical stimulation;Disease management/prevention;Community reintegration;Balance/vestibular training PT Transfers Anticipated Outcome(s): min assist PT Locomotion Anticipated Outcome(s): min assist household gait w/ LRAD PT Recommendation Follow Up Recommendations: Home health PT Patient destination: Home Equipment Recommended: To be determined  Skilled Therapeutic Intervention  Session 1:  Pt asleep in supine, max auditory and tactile stimulation to arouse. Total assist bed mobility 2/2 lethargy. Once sitting up, pt responding to this therapist consistently, however did not open eyes up majority of session. Performed stand pivot to w/c w/ max assist overall 2/2 multimodal cues for technique and sequencing. Hand-over-hand to guide RUE over to armrest of w/c. Performed stedy transfer to/from [facility] to  assess for nursing use. This seemed to work better for pt as it just has to focus on standing instead of both standing and pivoting, mod-max assist needed to stand from lower seat. Total assist for LE garment management and pericare in static stance, verbal and tactile cues to reach full upright in stedy. Pt perseverating throughout session on going back to bed to go back to sleep. Max tactile and auditory cues to engage with therapist and stay awake throughout session. No evidence of pt receiving any visual input. Pt would turn head towards therapist's voice, did not utilize UEs to identify items, needed hand-over-hand assist to facilitate UEs "guiding" him throughout session. Total assist w/c transport to/from day room. Pt requesting coffee. Attempted to engage in automatic, functional task w/ drinking coffee, however pt unable despite hand-over-hand total assist again. Pt w/ increasing agitation as therapist asked more questions about PLOF and home set-up, deferred information to chart review. Returned to room total assist and ended session in supine, all needs in reach. No family present @ evaluation, attempted to call wife w/ no answer, will continue to attempt to reach out to family and/or wife to update on pt's 84, results of eval, discharge recommendations, and goals.    Session 2:  Pt in supine and agreeable to therapy, no c/o pain. Supine>sit w/ total assist 2/2 lethargy and decreased environmental/body awareness. Static sitting balance w/ CGA-close supervision for 20 minutes while attempting to engage in functional task of eating. Total assist for multiple bites. Needed frequent verbal cues to attend to task including chewing and swallowing. Pt noted to have difficulty swallowing chopped meat, but consistently swallowing pureed food. Made RN and SLP aware. Hand-over-hand assist to take 2-3 bites, however pt unable to carryover to self-feed. Ended session in care of SLP and RN, all needs met.   PT  Evaluation Precautions/Restrictions Restrictions Weight Bearing Restrictions: No Home Living/Prior Functioning Home Living Available Help at Discharge: Family;Available 24 hours/day(spouse and family members able to arrange for 24/7 assist) Type of Home: House Home Access: Stairs to enter;Ramped entrance Entrance Stairs-Number of Steps: Per chart review, has ramp but also has 4 steps w/ B rails Home Layout: One level Bathroom Shower/Tub: (has tub bench and 3 in 1 (per acute chart))  Lives With: Spouse Prior Function Level of Independence: Independent with basic ADLs;Independent with homemaking with ambulation;Independent with gait;Independent with transfers(per chart review)  Able to Take Stairs?: Yes Driving: TF(5/7 baseline visual deficits from [place] in past) Vocation: Retired Oceanographer Perception: Impaired Body Scheme: poor body awareness Spatial Orientation: poor spatial orientation, requires hand-over-hand cues Praxis Praxis: Impaired Praxis Impairment Details: Initiation;Motor planning  Cognition Overall Cognitive Status: Impaired/Different from baseline Arousal/Alertness: Lethargic Orientation Level: Oriented to person Attention: Focused;Sustained Focused Attention: Impaired Focused Attention Impairment: Verbal basic;Functional basic Sustained Attention: Impaired Sustained Attention Impairment: Verbal basic;Functional basic Safety/Judgment: Impaired Sensation Sensation Light Touch: Appears Intact(confirms he can feel therapist's hand on extremities) Coordination Gross Motor Movements are Fluid and Coordinated: No(impaired 2/2 cortical blindness) Motor  Motor Motor: Motor apraxia;Motor perseverations Motor - Skilled Clinical Observations: cortical blindness, preseverates w/ UEs  Mobility Bed Mobility Bed Mobility: Rolling Right;Rolling Left;Supine to Sit;Sit to Supine Rolling Right: Total Assistance - Patient < 25% Rolling Left: Total  Assistance - Patient < 25% Supine to Sit: Total Assistance - Patient < 25% Sit to Supine: Total Assistance - Patient < 25%(suspect total assist 2/2 poor initiation and fatigue) Transfers Transfers: Sit to Stand;Stand to Sit;Squat Pivot Transfers Sit to Stand: Maximal Assistance - Patient 25-49% Stand to Sit: Maximal Assistance - Patient 25-49% Squat Pivot Transfers: Maximal Assistance - Patient 25-49% Transfer (Assistive device): None Locomotion  Gait Ambulation: No Gait Gait: No Stairs / Additional Locomotion Stairs: No Wheelchair Mobility Wheelchair Mobility: No  Trunk/Postural Assessment  Cervical Assessment Cervical Assessment: Exceptions to WFL(forward head, rounded shoulders) Thoracic Assessment Thoracic Assessment: Within Functional Limits Lumbar Assessment Lumbar Assessment: Exceptions to WFL(posterior pelvic tilt) Postural Control Postural Control: Deficits on evaluation(absent)  Balance Balance Balance Assessed: Yes Static Sitting Balance Static Sitting - Level of Assistance: 4: Min assist Dynamic Sitting Balance Dynamic Sitting - Level of Assistance: 3: Mod assist Static Standing Balance Static Standing - Level of Assistance: 2: Max assist Dynamic Standing Balance Dynamic Standing - Level of Assistance: 2: Max assist Extremity Assessment      RLE Assessment RLE Assessment: Exceptions to [facility] General Strength Comments: Unable to formally assess 2/2 cognition, moving extemities against gravity however requies significant (max) assist to boost into standing LLE Assessment LLE Assessment: Exceptions to [facility] General Strength Comments: Unable to formally assess 2/2 cognition, moving extemities against gravity however requies significant (max) assist to boost into standing    Refer to Care Plan for Long Term Goals  Recommendations for other services: None   Discharge Criteria: Patient will be discharged from PT if patient refuses treatment 3 consecutive times  without medical reason, if treatment goals not met, if there is a change in medical status, if patient makes no progress towards goals or if patient is discharged from hospital.  The above assessment, treatment plan, treatment alternatives and goals were discussed and mutually agreed upon: No family available/patient unable  Neidra Girvan K Amsi Grimley 11/20/2018, 12:49 PM

## 2018-11-19 NOTE — Progress Notes (Signed)
\  SLP Cancellation Note  Patient Details Name: Walter Larsen MRN: [id number] DOB: 08-03-1935   Cancelled treatment:       Reason Eval/Treat Not Completed: Fatigue/lethargy limiting ability to participate(Pt was approached for treatment but was unable to maintain an adequate level of alertness to participate in treatment. SLP will re-attempt treatment at able.)  [name], [place], [place] Office number [phone] Pager [place] 11/19/2018, 12:43 PM

## 2018-11-19 NOTE — H&P (Signed)
Physical Medicine and Rehabilitation Admission H&P        Chief Complaint  Patient presents with  . Functional deficits due to CVA.       HPI: Walter Larsen is an 83 year old male with history of CAD s/p stenting, ICH 2014 with residual visual deficits, BLE DVTs,  who was admitted on 11/14/2018 with acute onset of total blindness.  MRI brain showed parenchymal hemorrhages posteriorly in both cerebral hemispheres with multiple additional chronic hemorrhages and punctate foci of diffusion abnormality left frontal lobe and left cerebellum.  CT head showed large hematoma right frontotemporoparietal junction with adjacent focus of hemorrhage right occipital lobe and left parieto-occipital lobe, minimal bilateral subarachnoid hemorrhage with bleed suggestive of amyloid angiopathy. 2D echo with EF of 55 to 60% with moderate increase in LV and moderate calcification on aortic valve.  Carotid Dopplers were negative for significant ICA stenosis   Patient has had issues with confusion, disorientation and bouts of agitation.  EEG done revealing mild diffuse encephalopathy and was negative for seizures.  Elevated blood pressure was treated with Cleviprex this is been weaned off and he has been transitioned to metoprolol and amlodipine. Swallow evaluation completed and he was started on dysphagia 2 with thin liquids.  Neurology felt bleeding secondary to cerebral amyloid angiopathy doubt metastases ---not a candidate for antithrombotics or anticoagulation.  He has had issues with urinary retention--was cathed for 1100 blood tinged urine last night.  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  CIR recommended due to functional decline     Review of Systems  Unable to perform ROS: Mental acuity           Past Medical History:  Diagnosis Date  . Coronary artery disease      3 stents  . ICH (intracerebral hemorrhage) ([place]) 2014          Past Surgical History:  Procedure Laterality Date  . TEE WITHOUT CARDIOVERSION N/A 03/07/2013    Procedure: TRANSESOPHAGEAL ECHOCARDIOGRAM (TEE);  Surgeon: Fay Records, MD;  Location: [facility] ENDOSCOPY;  Service: Cardiovascular;  Laterality: N/A;          Family History  Problem Relation Age of Onset  . Hypertension Mother    . Hypertension Father       Social History:  reports that he has never smoked. He has never used smokeless tobacco. He reports that he does not drink alcohol or use drugs.           Allergies  Allergen Reactions  . Ativan [Lorazepam] Other (See Comments)      confusion           Medications Prior to Admission  Medication Sig Dispense Refill  . acetaminophen (TYLENOL) 325 MG tablet Take 325-650 mg by mouth every 6 (six) hours as needed for headache.       . Calcium Carb-Cholecalciferol (CALCIUM + D3) 600-800 MG-UNIT TABS Take 1 tablet by mouth daily.      Marland Kitchen esomeprazole (NEXIUM) 40 MG capsule Take 40 mg by mouth daily before breakfast.      . fenofibrate 160 MG tablet Take 160 mg by mouth daily.      . metoprolol succinate (TOPROL-XL) 50 MG 24 hr tablet Take 50 mg by mouth daily. Take with or immediately following a  meal.      . Misc Natural Products (GLUCOSAMINE CHONDROITIN TRIPLE) TABS Take 1 tablet by mouth daily.      . Multiple Vitamin (MULTIVITAMIN WITH MINERALS) TABS tablet Take 1 tablet by mouth daily with breakfast.      . nitroGLYCERIN (NITROSTAT) 0.4 MG SL tablet Place 0.4 mg under the tongue.      . Omega-3 Fatty Acids (FISH OIL PO) Take 1 capsule by mouth daily with breakfast.      . rosuvastatin (CRESTOR) 40 MG tablet Take 40 mg by mouth daily.      . tamsulosin (FLOMAX) 0.4 MG CAPS capsule Take 1 capsule (0.4 mg total) by mouth daily after breakfast. 30 capsule 0     Drug Regimen Review  Drug regimen was  reviewed and remains appropriate with no significant issues identified   Home: Home Living Family/patient expects to be discharged to:: Private residence Living Arrangements: Spouse/significant other Available Help at Discharge: Family Type of Home: House Home Access: Stairs to enter Technical brewer of Steps: 4 Entrance Stairs-Rails: Right, Left Home Layout: One level Bathroom Shower/Tub: Tub/shower unit, Architectural technologist: Standard Home Equipment: Tub bench, Bedside commode Additional Comments: All info above was taken from prior admission as pt not able to provide info this session  Lives With: Spouse   Functional History: Prior Function Level of Independence: Independent   Functional Status:  Mobility: Bed Mobility Overal bed mobility: Needs Assistance Bed Mobility: Supine to Sit Rolling: Max assist, Mod assist Supine to sit: Min assist Sit to supine: +2 for physical assistance, Max assist General bed mobility comments: assist to elevate trunk into sitting  Transfers Overall transfer level: Needs assistance Equipment used: 1 person hand held assist, 2 person hand held assist Transfers: Sit to/from Stand, Stand Pivot Transfers Sit to Stand: Min assist, +2 safety/equipment Stand pivot transfers: Min assist, +2 physical assistance, +2 safety/equipment General transfer comment: pt stood from EOB with min A (+2 for safety) and able to pivot EOB to [facility] with min A +2 HHA; assist to steady and directional cues required due to vision Ambulation/Gait Ambulation/Gait assistance: +2 physical assistance, Min assist, +2 safety/equipment Gait Distance (Feet): 6 Feet Assistive device: 2 person hand held assist Gait Pattern/deviations: Step-to pattern, Decreased step length - right, Decreased step length - left General Gait Details: directional cues and assistance for balance; pt with short step lengths; no LOB     ADL: ADL Overall ADL's : Needs assistance/impaired  Grooming: Wash/dry face, Wash/dry hands, Sitting, Minimal assistance Grooming Details (indicate cue type and reason): mod cues to locate wash cloth and to utilize appropriate item as pt wiping and blowing nose in hospital gown Upper Body Dressing : Moderate assistance Upper Body Dressing Details (indicate cue type and reason): mod assist to don hospital gown as pt with difficulty threading arms through arm holes due to impaired vision, cognition, and confusion Functional mobility during ADLs: +2 for physical assistance, Maximal assistance General ADL Comments: Mod-max assist for bed mobility due to difficulty with following directions and increased confusion.  Engaged in sit > stand from EOB but unsafe to transfer OOB at this time due to cognition impeding progress.   Cognition: Cognition Overall Cognitive Status: Impaired/Different from baseline Arousal/Alertness: Awake/alert Orientation Level: Oriented to person Attention: Focused, Sustained Focused Attention: Impaired Focused Attention Impairment: Verbal complex Sustained Attention: Impaired Sustained Attention Impairment: Verbal complex Memory: Impaired Memory Impairment: Storage deficit, Decreased recall of new information, Retrieval deficit(Immediate: 2/3; with cue: 1/1; Delayed: 0/3) Awareness: Impaired  Awareness Impairment: Intellectual impairment Problem Solving: Impaired Problem Solving Impairment: Verbal complex, Verbal basic Cognition Arousal/Alertness: Awake/alert Behavior During Therapy: WFL for tasks assessed/performed Overall Cognitive Status: Impaired/Different from baseline Area of Impairment: Attention, Following commands, Problem solving, Orientation, Memory Orientation Level: Disoriented to, Place, Time, Situation Current Attention Level: Sustained Memory: Decreased short-term memory Following Commands: Follows one step commands consistently(tactile cues needed, repetition) Awareness: Intellectual Problem  Solving: Requires verbal cues, Requires tactile cues, Difficulty sequencing General Comments: pt follows commands much more consistently this session and with appropriate conversation Difficult to assess due to: Hard of hearing/deaf, Impaired communication     Blood pressure (!) 165/86, pulse 80, temperature 98.8 F (37.1 C), temperature source Axillary, resp. rate 16, height 6' (1.829 m), weight 84.9 kg, SpO2 97 %. Physical Exam  Constitutional: He appears well-developed and well-nourished.  Sitting up in bed--eyes closed. Pulling on mittens and indicating need to urinate. Rambling speech.   HENT:  Head: Normocephalic and atraumatic.  Eyes: Pupils are equal, round, and reactive to light. EOM are normal.  Neck: Normal range of motion.  Cardiovascular: Normal rate and regular rhythm.  No murmur heard. Respiratory: Effort normal. No respiratory distress. He has no wheezes.  GI: Soft. He exhibits no distension. There is no abdominal tenderness.  Genitourinary:    Genitourinary Comments: Condom cath   Musculoskeletal:        General: No edema.  Neurological:  Needed max cues to open eyes--does not make eye contact when opened. (received haldol a couple hours earlier)  He was oriented to self--was able to state age/DOB but thought that he was in [place]. Needed cues to stay awake. Moves all 4 limbs and responds to pain in all 4 limbs. No resting tone.   Skin: Skin is warm and dry.  Psychiatric:  Sedated, restless when he awakens.       Lab Results Last 48 Hours        Results for orders placed or performed during the hospital encounter of 11/14/18 (from the past 48 hour(s))  Basic metabolic panel     Status: Abnormal    Collection Time: 11/19/18  7:24 AM  Result Value Ref Range    Sodium 135 135 - 145 mmol/L    Potassium 3.7 3.5 - 5.1 mmol/L    Chloride 103 98 - 111 mmol/L    CO2 19 (L) 22 - 32 mmol/L    Glucose, Bld 100 (H) 70 - 99 mg/dL    BUN 9 8 - 23 mg/dL    Creatinine, Ser  0.86 0.61 - 1.24 mg/dL    Calcium 8.9 8.9 - 10.3 mg/dL    GFR calc non Af Amer >60 >60 mL/min    GFR calc Af Amer >60 >60 mL/min    Anion gap 13 5 - 15      Comment: Performed at [facility], [place] [address], [place], [place] [address]     Imaging Results (Last 48 hours)  No results found.           Medical Problem List and Plan: 1.  Functional deficits secondary to [place] with cortical blindness and confusion             -admit to inpatient rehab             -enclosure bed for safety 2.  Antithrombotics: -DVT/anticoagulation:  Mechanical: Sequential compression devices, below knee Bilateral lower extremities             -antiplatelet therapy: N/A  3. Pain Management: N/A 4. Mood: LCSW to follow for evaluation and support when appropriate             -antipsychotic agents: add scheduled seroquel at HS             -check sleep chart, need to re-establish sleep/wake cycle 5. Neuropsych: This patient is not capable of making decisions on his own behalf. 6. Skin/Wound Care: Routine pressure relief measures 7. Fluids/Electrolytes/Nutrition: Monitor I's and O's.  Offer supplements between meals 8.  HTN: Monitor 3 times daily.  Continue amlodipine and Lopressor.  Titrate medications for better control 9.  Agitation: Continue Depakote every 8 hours. Check level in am.              -seroquel as above 10.  GERD: On Protonix 11.  Leukocytosis: Monitor for signs of infection. 12. Urinary retention: Continue Flomax. Check UA/UCS. Monitor voiding with bladder scan and cath for volumes > 350 cc       Post Admission Physician Evaluation: 1. Functional deficits secondary  to [place]. 2. Patient is admitted to receive collaborative, interdisciplinary care between the physiatrist, rehab nursing staff, and therapy team. 3. Patient's level of medical complexity and substantial therapy needs in context of that medical necessity cannot be provided at a lesser intensity of care such as a SNF. 4.  Patient has experienced substantial functional loss from his/her baseline which was documented above under the "Functional History" and "Functional Status" headings.  Judging by the patient's diagnosis, physical exam, and functional history, the patient has potential for functional progress which will result in measurable gains while on inpatient rehab.  These gains will be of substantial and practical use upon discharge  in facilitating mobility and self-care at the household level. 5. Physiatrist will provide 24 hour management of medical needs as well as oversight of the therapy plan/treatment and provide guidance as appropriate regarding the interaction of the two. 6. The Preadmission Screening has been reviewed and patient status is unchanged unless otherwise stated above. 7. 24 hour rehab nursing will assist with bladder management, bowel management, safety, skin/wound care, disease management, medication administration, pain management and patient education  and help integrate therapy concepts, techniques,education, etc. 8. PT will assess and treat for/with: Lower extremity strength, range of motion, stamina, balance, functional mobility, safety, adaptive techniques and equipment, NMR, spatial awareness, behavior.   Goals are: min assist. 9. OT will assess and treat for/with: ADL's, functional mobility, safety, upper extremity strength, adaptive techniques and equipment, family, spatial awareness .   Goals are: min to mod assist. Therapy may proceed with showering this patient. 10. SLP will assess and treat for/with: cognition, communication, behavior, family ed.  Goals are: min assist. 11. Case Management and Social Worker will assess and treat for psychological issues and discharge planning. 12. Team conference will be held weekly to assess progress toward goals and to determine barriers to discharge. 13. Patient will receive at least 3 hours of therapy per day at least 5 days per week. 14. ELOS:  8-13 days depending upon cognitive recovery       15. Prognosis:  excellent   I have personally performed a face to face diagnostic evaluation of this patient and formulated the key components of the plan.  Additionally, I have personally reviewed laboratory data, imaging studies, as well as relevant notes and concur with the physician assistant's documentation above.  Meredith Staggers, MD, Mellody Drown      Bary Leriche, PA-C 11/19/2018

## 2018-11-19 NOTE — H&P (Signed)
Physical Medicine and Rehabilitation Admission H&P    Chief Complaint  Patient presents with  . Functional deficits due to CVA.     HPI: Walter Larsen is an 83 year old male with history of CAD s/p stenting, ICH 2014 with residual visual deficits, BLE DVTs,  who was admitted on 11/14/2018 with acute onset of total blindness.  MRI brain showed parenchymal hemorrhages posteriorly in both cerebral hemispheres with multiple additional chronic hemorrhages and punctate foci of diffusion abnormality left frontal lobe and left cerebellum.  CT head showed large hematoma right frontotemporoparietal junction with adjacent focus of hemorrhage right occipital lobe and left parieto-occipital lobe, minimal bilateral subarachnoid hemorrhage with bleed suggestive of amyloid angiopathy. 2D echo with EF of 55 to 60% with moderate increase in LV and moderate calcification on aortic valve.  Carotid Dopplers were negative for significant ICA stenosis  Patient has had issues with confusion, disorientation and bouts of agitation.  EEG done revealing mild diffuse encephalopathy and was negative for seizures.  Elevated blood pressure was treated with Cleviprex this is been weaned off and he has been transitioned to metoprolol and amlodipine. Swallow evaluation completed and he was started on dysphagia 2 with thin liquids.  Neurology felt bleeding secondary to cerebral amyloid angiopathy doubt metastases ---not a candidate for antithrombotics or anticoagulation.  He has had issues with urinary retention--was cathed for 1100 blood tinged urine last night.  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  CIR recommended due to functional decline   Review of Systems  Unable to perform ROS: Mental acuity     Past Medical  History:  Diagnosis Date  . Coronary artery disease    3 stents  . ICH (intracerebral hemorrhage) ([place]) 2014    Past Surgical History:  Procedure Laterality Date  . TEE WITHOUT CARDIOVERSION N/A 03/07/2013   Procedure: TRANSESOPHAGEAL ECHOCARDIOGRAM (TEE);  Surgeon: Fay Records, MD;  Location: [facility] ENDOSCOPY;  Service: Cardiovascular;  Laterality: N/A;    Family History  Problem Relation Age of Onset  . Hypertension Mother   . Hypertension Father     Social History:  reports that he has never smoked. He has never used smokeless tobacco. He reports that he does not drink alcohol or use drugs.    Allergies  Allergen Reactions  . Ativan [Lorazepam] Other (See Comments)    confusion    Medications Prior to Admission  Medication Sig Dispense Refill  . acetaminophen (TYLENOL) 325 MG tablet Take 325-650 mg by mouth every 6 (six) hours as needed for headache.     . Calcium Carb-Cholecalciferol (CALCIUM + D3) 600-800 MG-UNIT TABS Take 1 tablet by mouth daily.    Marland Kitchen esomeprazole (NEXIUM) 40 MG capsule Take 40 mg by mouth daily before breakfast.    . fenofibrate 160 MG tablet Take 160 mg by mouth daily.    . metoprolol succinate (TOPROL-XL) 50 MG 24 hr tablet Take 50 mg by mouth daily. Take with or immediately following a meal.    . Misc Natural Products (GLUCOSAMINE CHONDROITIN TRIPLE) TABS Take 1 tablet by mouth daily.    . Multiple Vitamin (MULTIVITAMIN WITH MINERALS) TABS tablet Take 1 tablet by mouth daily with breakfast.    . nitroGLYCERIN (NITROSTAT) 0.4 MG SL tablet Place 0.4 mg under the tongue.    . Omega-3  Fatty Acids (FISH OIL PO) Take 1 capsule by mouth daily with breakfast.    . rosuvastatin (CRESTOR) 40 MG tablet Take 40 mg by mouth daily.    . tamsulosin (FLOMAX) 0.4 MG CAPS capsule Take 1 capsule (0.4 mg total) by mouth daily after breakfast. 30 capsule 0    Drug Regimen Review  Drug regimen was reviewed and remains appropriate with no significant issues identified   Home: Home Living Family/patient expects to be discharged to:: Private residence Living Arrangements: Spouse/significant other Available Help at Discharge: Family Type of Home: House Home Access: Stairs to enter Technical brewer of Steps: 4 Entrance Stairs-Rails: Right, Left Home Layout: One level Bathroom Shower/Tub: Tub/shower unit, Architectural technologist: Standard Home Equipment: Tub bench, Bedside commode Additional Comments: All info above was taken from prior admission as pt not able to provide info this session  Lives With: Spouse   Functional History: Prior Function Level of Independence: Independent  Functional Status:  Mobility: Bed Mobility Overal bed mobility: Needs Assistance Bed Mobility: Supine to Sit Rolling: Max assist, Mod assist Supine to sit: Min assist Sit to supine: +2 for physical assistance, Max assist General bed mobility comments: assist to elevate trunk into sitting  Transfers Overall transfer level: Needs assistance Equipment used: 1 person hand held assist, 2 person hand held assist Transfers: Sit to/from Stand, Stand Pivot Transfers Sit to Stand: Min assist, +2 safety/equipment Stand pivot transfers: Min assist, +2 physical assistance, +2 safety/equipment General transfer comment: pt stood from EOB with min A (+2 for safety) and able to pivot EOB to [facility] with min A +2 HHA; assist to steady and directional cues required due to vision Ambulation/Gait Ambulation/Gait assistance: +2 physical assistance, Min assist, +2 safety/equipment Gait Distance (Feet): 6 Feet Assistive device: 2 person hand held assist Gait Pattern/deviations: Step-to pattern, Decreased step length - right, Decreased step length - left General Gait Details: directional cues and assistance for balance; pt with short step lengths; no LOB    ADL: ADL Overall ADL's : Needs assistance/impaired Grooming: Wash/dry face, Wash/dry hands, Sitting, Minimal assistance Grooming  Details (indicate cue type and reason): mod cues to locate wash cloth and to utilize appropriate item as pt wiping and blowing nose in hospital gown Upper Body Dressing : Moderate assistance Upper Body Dressing Details (indicate cue type and reason): mod assist to don hospital gown as pt with difficulty threading arms through arm holes due to impaired vision, cognition, and confusion Functional mobility during ADLs: +2 for physical assistance, Maximal assistance General ADL Comments: Mod-max assist for bed mobility due to difficulty with following directions and increased confusion.  Engaged in sit > stand from EOB but unsafe to transfer OOB at this time due to cognition impeding progress.  Cognition: Cognition Overall Cognitive Status: Impaired/Different from baseline Arousal/Alertness: Awake/alert Orientation Level: Oriented to person Attention: Focused, Sustained Focused Attention: Impaired Focused Attention Impairment: Verbal complex Sustained Attention: Impaired Sustained Attention Impairment: Verbal complex Memory: Impaired Memory Impairment: Storage deficit, Decreased recall of new information, Retrieval deficit(Immediate: 2/3; with cue: 1/1; Delayed: 0/3) Awareness: Impaired Awareness Impairment: Intellectual impairment Problem Solving: Impaired Problem Solving Impairment: Verbal complex, Verbal basic Cognition Arousal/Alertness: Awake/alert Behavior During Therapy: WFL for tasks assessed/performed Overall Cognitive Status: Impaired/Different from baseline Area of Impairment: Attention, Following commands, Problem solving, Orientation, Memory Orientation Level: Disoriented to, Place, Time, Situation Current Attention Level: Sustained Memory: Decreased short-term memory Following Commands: Follows one step commands consistently(tactile cues needed, repetition) Awareness: Intellectual Problem Solving: Requires verbal cues, Requires tactile cues,  Difficulty sequencing General  Comments: pt follows commands much more consistently this session and with appropriate conversation Difficult to assess due to: Hard of hearing/deaf, Impaired communication   Blood pressure (!) 165/86, pulse 80, temperature 98.8 F (37.1 C), temperature source Axillary, resp. rate 16, height 6' (1.829 m), weight 84.9 kg, SpO2 97 %. Physical Exam  Constitutional: He appears well-developed and well-nourished.  Sitting up in bed--eyes closed. Pulling on mittens and indicating need to urinate. Rambling speech.   HENT:  Head: Normocephalic and atraumatic.  Eyes: Pupils are equal, round, and reactive to light. EOM are normal.  Neck: Normal range of motion.  Cardiovascular: Normal rate and regular rhythm.  No murmur heard. Respiratory: Effort normal. No respiratory distress. He has no wheezes.  GI: Soft. He exhibits no distension. There is no abdominal tenderness.  Genitourinary:    Genitourinary Comments: Condom cath   Musculoskeletal:        General: No edema.  Neurological:  Needed max cues to open eyes--does not make eye contact when opened. (received haldol a couple hours earlier)  He was oriented to self--was able to state age/DOB but thought that he was in [place]. Needed cues to stay awake. Moves all 4 limbs and responds to pain in all 4 limbs. No resting tone.   Skin: Skin is warm and dry.  Psychiatric:  Sedated, restless when he awakens.     Results for orders placed or performed during the hospital encounter of 11/14/18 (from the past 48 hour(s))  Basic metabolic panel     Status: Abnormal   Collection Time: 11/19/18  7:24 AM  Result Value Ref Range   Sodium 135 135 - 145 mmol/L   Potassium 3.7 3.5 - 5.1 mmol/L   Chloride 103 98 - 111 mmol/L   CO2 19 (L) 22 - 32 mmol/L   Glucose, Bld 100 (H) 70 - 99 mg/dL   BUN 9 8 - 23 mg/dL   Creatinine, Ser 0.86 0.61 - 1.24 mg/dL   Calcium 8.9 8.9 - 10.3 mg/dL   GFR calc non Af Amer >60 >60 mL/min   GFR calc Af Amer >60 >60 mL/min    Anion gap 13 5 - 15    Comment: Performed at [facility], [place] [address], [place], [place] [address]   No results found.     Medical Problem List and Plan: 1.  Functional deficits secondary to [place] with cortical blindness and confusion  -admit to inpatient rehab  -enclosure bed for safety 2.  Antithrombotics: -DVT/anticoagulation:  Mechanical: Sequential compression devices, below knee Bilateral lower extremities  -antiplatelet therapy: N/A 3. Pain Management: N/A 4. Mood: LCSW to follow for evaluation and support when appropriate  -antipsychotic agents: add scheduled seroquel at HS  -check sleep chart, need to re-establish sleep/wake cycle 5. Neuropsych: This patient is not capable of making decisions on his own behalf. 6. Skin/Wound Care: Routine pressure relief measures 7. Fluids/Electrolytes/Nutrition: Monitor I's and O's.  Offer supplements between meals 8.  HTN: Monitor 3 times daily.  Continue amlodipine and Lopressor.  Titrate medications for better control 9.  Agitation: Continue Depakote every 8 hours. Check level in am.   -seroquel as above 10.  GERD: On Protonix 11.  Leukocytosis: Monitor for signs of infection. 12. Urinary retention: Continue Flomax. Check UA/UCS. Monitor voiding with bladder scan and cath for volumes > 350 cc       Bary Leriche, PA-C 11/19/2018

## 2018-11-19 NOTE — Progress Notes (Signed)
Meredith Staggers, MD  Physician  Physical Medicine and Rehabilitation  PMR Pre-admission  Signed  Date of Service:  11/17/2018 12:14 PM      Related encounter: ED to Hosp-Admission (Discharged) from 11/14/2018 in [facility]      Signed         PMR Admission Coordinator Pre-Admission Assessment  Patient: Walter Larsen is an 83 y.o., male MRN: [id number] DOB: 01-01-1936 Height: 6' (182.9 cm) Weight: 84.9 kg  Insurance Information HMO:     PPO: yes     PCP:      IPA:      80/20:      OTHER:  PRIMARY: [employer] Medicare       Policy#: [id number]      Subscriber: Patient CM Name: Mickel Baas      Phone#: [phone]     Fax#: [phone] Pre-Cert#: [id number]      Employer:  Josem Kaufmann [phone] approved by Mickel Baas for admit to CIR. Pt is approved for 7 days (8/7 to 8/13). Follow up CM is Chelsea (p): [phone] (f): [phone] Benefits:  Phone #: online     Name: [email or web address] Eff. Date: 04/14/2018 still active     Deduct: $0      Out of Pocket Max: $4,200 ($50.00 met)      Life Max: NA CIR: $250/day co-pay with max co-pay/admission of $1,500 (6 days)      SNF: $0/day co-pay for days 1-20, $178/day co-pay for days 21-100; limited to 100 days/cal yr. Outpatient: limited by medical necessity     Co-Pay: $35/visist Home Health: 100%; limited by medical necessity      Co-Pay: 0% DME: 80%     Co-Pay: 20% Providers:  SECONDARY: None      Policy#:       Subscriber:  CM Name:       Phone#:      Fax#:  Pre-Cert#:       Employer:  Benefits:  Phone #:      Name:  Eff. Date:      Deduct:       Out of Pocket Max:       Life Max:  CIR:       SNF: Outpatient:      Co-Pay:  Home Health:       Co-Pay:  DME:      Co-Pay:   Medicaid Application Date:       Case Manager:  Disability Application Date:       Case Worker:   The Data Collection Information Summary for patients in Inpatient Rehabilitation Facilities with attached Privacy Act [place] Records  was provided and verbally reviewed with: Family  Emergency Contact Information         Contact Information    Name Relation Home Work Mobile   Adinolfi,Linda Spouse [phone]  [phone]   Davaun, Quintela [phone]        Current Medical History  Patient Admitting Diagnosis: Bilateral posterior cerebral hemisphere hemorrhage   History of Present Illness: Pt is an 83 yo M with history of CAD s/p sent x3, and 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 Pt presented to the hospital with an acute onset of bilateral blindness and mild confusion. CT showed multifocal acute intracerebral hemorrhages bilaterally. 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 Underlying chronic white matter disease was also noted. Pt was admitted to neuro ICU. Carotid dopplers showed 1-39% stenosis in bilateral ICA. 2D echo showed ER 55-60%.  Pt failed a swallow study on 8/2. Pt recommended for DYS 2, thin liquids. Pt has been confused and disoriented requiring haldol. EEG showed some mild diffuse encephalopathy but no seizure or epileptiform discharges. Pt has been weaned off Cleviprex drip. Pt has been evaluated by therapies with recommendation for CIR.   Complete NIHSS TOTAL: 6  Patient's medical record from [facility] has been reviewed by the rehabilitation admission coordinator and physician.  Past Medical History      Past Medical History:  Diagnosis Date   Coronary artery disease    3 stents   Stroke [facility])     Family History   family history includes Hypertension in his father and mother.  Prior Rehab/Hospitalizations Has the patient had prior rehab or hospitalizations prior to admission? Yes  Has the patient had major surgery during 100 days prior to admission? No              Current Medications  Current Facility-Administered Medications:    acetaminophen (TYLENOL) tablet 650 mg, 650 mg, Oral, Q4H PRN, 650 mg at 11/18/18  2132 **OR** acetaminophen (TYLENOL) solution 650 mg, 650 mg, Per Tube, Q4H PRN **OR** acetaminophen (TYLENOL) suppository 650 mg, 650 mg, Rectal, Q4H PRN, Miachel Roux, Sharon L, NP   amLODipine (NORVASC) tablet 5 mg, 5 mg, Oral, Daily, Rizwan, Saima, MD, 5 mg at 11/19/18 0804   divalproex (DEPAKOTE SPRINKLE) capsule 250 mg, 250 mg, Oral, Q8H, Biby, Sharon L, NP, 250 mg at 11/19/18 1610   fenofibrate tablet 160 mg, 160 mg, Oral, Daily, Biby, Sharon L, NP, 160 mg at 11/19/18 0805   haloperidol lactate (HALDOL) injection 1 mg, 1 mg, Intravenous, Q6H PRN, Burnetta Sabin L, NP, 1 mg at 11/19/18 1120   hydrALAZINE (APRESOLINE) injection 20 mg, 20 mg, Intravenous, Q6H PRN, Burnetta Sabin L, NP, 20 mg at 11/16/18 2016   labetalol (NORMODYNE) injection 20 mg, 20 mg, Intravenous, Q2H PRN, Biby, Sharon L, NP, 20 mg at 11/18/18 1703   MEDLINE mouth rinse, 15 mL, Mouth Rinse, BID, Leonie Man, Pramod S, MD, 15 mL at 11/19/18 0806   metoprolol tartrate (LOPRESSOR) tablet 25 mg, 25 mg, Oral, BID, Masters, Jake Church, RPH, 25 mg at 11/19/18 0804   pantoprazole (PROTONIX) EC tablet 40 mg, 40 mg, Oral, QHS, Biby, Sharon L, NP, 40 mg at 11/18/18 2131   senna-docusate (Senokot-S) tablet 1 tablet, 1 tablet, Oral, BID, Biby, Sharon L, NP, 1 tablet at 11/19/18 0804   tamsulosin (FLOMAX) capsule 0.4 mg, 0.4 mg, Oral, QPC breakfast, Biby, Sharon L, NP, 0.4 mg at 11/19/18 0804  Patients Current Diet:     Diet Order                  Diet - low sodium heart healthy         DIET DYS 2 Room service appropriate? Yes with Assist; Fluid consistency: Thin  Diet effective now               Precautions / Restrictions Precautions Precautions: Fall Precaution Comments: SBP <160; cortically blind(can see shadows/light/shapes close to him) Restrictions Weight Bearing Restrictions: No   Has the patient had 2 or more falls or a fall with injury in the past year? No  Prior Activity Level Community (5-7x/wk): did not  drive due to long standing visual deficits; was active around the house, walked to mailbox daily, uses no AD, Independent PTA  Prior Functional Level Self Care: Did the patient need help bathing, dressing, using the toilet  or eating? Independent  Indoor Mobility: Did the patient need assistance with walking from room to room (with or without device)? Independent  Stairs: Did the patient need assistance with internal or external stairs (with or without device)? Independent  Functional Cognition: Did the patient need help planning regular tasks such as shopping or remembering to take medications? Independent  Home Assistive Devices / Equipment Home Assistive Devices/Equipment: None Home Equipment: Tub bench, Bedside commode  Prior Device Use: Indicate devices/aids used by the patient prior to current illness, exacerbation or injury? None of the above  Current Functional Level Cognition  Arousal/Alertness: Awake/alert Overall Cognitive Status: Impaired/Different from baseline Difficult to assess due to: Hard of hearing/deaf, Impaired communication Current Attention Level: Sustained Orientation Level: Oriented to person Following Commands: Follows one step commands consistently(tactile cues needed, repetition) General Comments: pt follows commands much more consistently this session and with appropriate conversation Attention: Focused, Sustained Focused Attention: Impaired Focused Attention Impairment: Verbal complex Sustained Attention: Impaired Sustained Attention Impairment: Verbal complex Memory: Impaired Memory Impairment: Storage deficit, Decreased recall of new information, Retrieval deficit(Immediate: 2/3; with cue: 1/1; Delayed: 0/3) Awareness: Impaired Awareness Impairment: Intellectual impairment Problem Solving: Impaired Problem Solving Impairment: Verbal complex, Verbal basic    Extremity Assessment (includes Sensation/Coordination)  Upper Extremity  Assessment: Generalized weakness  Lower Extremity Assessment: Generalized weakness, Difficult to assess due to impaired cognition    ADLs  Overall ADL's : Needs assistance/impaired Grooming: Wash/dry face, Wash/dry hands, Sitting, Minimal assistance Grooming Details (indicate cue type and reason): mod cues to locate wash cloth and to utilize appropriate item as pt wiping and blowing nose in hospital gown Upper Body Dressing : Moderate assistance Upper Body Dressing Details (indicate cue type and reason): mod assist to don hospital gown as pt with difficulty threading arms through arm holes due to impaired vision, cognition, and confusion Functional mobility during ADLs: +2 for physical assistance, Maximal assistance General ADL Comments: Mod-max assist for bed mobility due to difficulty with following directions and increased confusion.  Engaged in sit > stand from EOB but unsafe to transfer OOB at this time due to cognition impeding progress.    Mobility  Overal bed mobility: Needs Assistance Bed Mobility: Supine to Sit Rolling: Max assist, Mod assist Supine to sit: Min assist Sit to supine: +2 for physical assistance, Max assist General bed mobility comments: assist to elevate trunk into sitting     Transfers  Overall transfer level: Needs assistance Equipment used: 1 person hand held assist, 2 person hand held assist Transfers: Sit to/from Stand, Stand Pivot Transfers Sit to Stand: Min assist, +2 safety/equipment Stand pivot transfers: Min assist, +2 physical assistance, +2 safety/equipment General transfer comment: pt stood from EOB with min A (+2 for safety) and able to pivot EOB to [facility] with min A +2 HHA; assist to steady and directional cues required due to vision    Ambulation / Gait / Stairs / Wheelchair Mobility  Ambulation/Gait Ambulation/Gait assistance: +2 physical assistance, Min assist, +2 safety/equipment Gait Distance (Feet): 6 Feet Assistive device: 2 person  hand held assist Gait Pattern/deviations: Step-to pattern, Decreased step length - right, Decreased step length - left General Gait Details: directional cues and assistance for balance; pt with short step lengths; no LOB    Posture / Balance Dynamic Sitting Balance Sitting balance - Comments: Able to sit EOB with close min guard; attempting to stand multiple times. Impulsive. Balance Overall balance assessment: Needs assistance Sitting-balance support: Feet supported, No upper extremity supported Sitting balance-Leahy Scale: Fair  Sitting balance - Comments: Able to sit EOB with close min guard; attempting to stand multiple times. Impulsive. Standing balance support: During functional activity Standing balance-Leahy Scale: Poor Standing balance comment: External support needed, posterior bias.    Special needs/care consideration BiPAP/CPAP : no CPM : no Continuous Drip IV : no Dialysis : no        Days : no Life Vest : no Oxygen : no, RA Special Bed : no Trach Size : no Wound Vac (area) : no      Location : no Skin: ecchymosis right thigh and arm                       Bowel mgmt: continent 11/18/18 Bladder mgmt: continent, has condom cath Diabetic mgmt: no Behavioral consideration : confused, disoriented; safety precautions with 1:1 sitter. Chemo/radiation : no   Previous Home Environment (from acute therapy documentation) Living Arrangements: Spouse/significant other  Lives With: Spouse Available Help at Discharge: Family Type of Home: House Home Layout: One level Home Access: Stairs to enter Entrance Stairs-Rails: Right, Left Entrance Stairs-Number of Steps: 4 Bathroom Shower/Tub: Tub/shower unit, Architectural technologist: Standard Home Care Services: No Additional Comments: All info above was taken from prior admission as pt not able to provide info this session  Discharge Living Setting Plans for Discharge Living Setting: Patient's home, Lives with (comment)(lives  with wife) Type of Home at Discharge: House Discharge Home Layout: One level Discharge Home Access: [place] entrance Discharge Bathroom Shower/Tub: Tub/shower unit Discharge Bathroom Toilet: Handicapped height Discharge Bathroom Accessibility: Yes How Accessible: Accessible via walker Does the patient have any problems obtaining your medications?: No  Social/Family/Support Systems Patient Roles: Spouse Contact Information: wife: [name] [phone]; son [name]): [phone] Anticipated Caregiver: wife, son, grandchildren, hired help as needed Anticipated Ambulance person Information: see above Ability/Limitations of Caregiver: Min A Caregiver Availability: 24/7 Discharge Plan Discussed with Primary Caregiver: Yes Is Caregiver In Agreement with Plan?: Yes Does Caregiver/Family have Issues with Lodging/Transportation while Pt is in Rehab?: No  Goals/Additional Needs Patient/Family Goal for Rehab: PT: Min A; OT: Min/Mod A; SLP: Min A Expected length of stay: 7-10 days Cultural Considerations: NA Dietary Needs: DYS 2, thin liquids; room service with assist. Equipment Needs: TBD Pt/Family Agrees to Admission and willing to participate: Yes Program Orientation Provided & Reviewed with Pt/Caregiver Including Roles  & Responsibilities: Yes(son who has been in communication with rest of the family); wife  Barriers to Discharge: Incontinence, Lack of/limited family support, Insurance for SNF coverage, Behavior  Barriers to Discharge Comments: family open to hiring help as needed; worried about potential cath care, would need training; pt currently in mittins due to restlessness/safety concerns  Decrease burden of Care through IP rehab admission: NA  Possible need for SNF placement upon discharge: Not anticipated; Pt has very supportive family who plans to have 24/7 A at DC. They understand his anticipated level of assist at DC and are prepared to have pt return home with that level of  assist.   Patient Condition: I have reviewed medical records from [facility], spoken with RN, MD and patient and son and wife. I met with patient at the bedside for inpatient rehabilitation assessment.  Patient will benefit from ongoing PT, OT and SLP, can actively participate in 3 hours of therapy a day 5 days of the week, and can make measurable gains during the admission.  Patient will also benefit from the coordinated team approach during an Inpatient Acute  Rehabilitation admission.  The patient will receive intensive therapy as well as Rehabilitation physician, nursing, social worker, and care management interventions.  Due to bladder management, bowel management, safety, skin/wound care, disease management, medication administration, pain management and patient education the patient requires 24 hour a day rehabilitation nursing.  The patient is currently Min A x2 for transfers and ambulation of 6 feet and Min to Max X2 for basic ADLs.  Discharge setting and therapy post discharge at home with home health is anticipated.  Patient has agreed to participate in the Acute Inpatient Rehabilitation Program and will admit 11/19/2018.  Preadmission Screen Completed By:  Jhonnie Garner, 11/19/2018 12:21 PM  ______________________________________________________________________   Discussed status with Dr. Naaman Plummer on 11/19/2018 at 12:00PM and received approval for admission today.  Admission Coordinator:  Jhonnie Garner, OT, time 12:00PM/Date 11/19/2018   Assessment/Plan: Diagnosis: bilateral posterior cerebral hemorrhages 1. Does the need for close, 24 hr/day Medical supervision in concert with the patient's rehab needs make it unreasonable for this patient to be served in a less intensive setting? Yes 2. Co-Morbidities requiring supervision/potential complications: HTN, CAD, dysphagia 3. Due to bladder management, bowel management, safety, skin/wound care, disease management, medication  administration, pain management and patient education, does the patient require 24 hr/day rehab nursing? Yes 4. Does the patient require coordinated care of a physician, rehab nurse, PT (1-2 hrs/day, 5 days/week), OT (1-2 hrs/day, 5 days/week) and SLP (1-2 hrs/day, 5 days/week) to address physical and functional deficits in the context of the above medical diagnosis(es)? Yes Addressing deficits in the following areas: balance, endurance, locomotion, strength, transferring, bowel/bladder control, bathing, dressing, feeding, grooming, toileting, cognition, speech, swallowing and psychosocial support 5. Can the patient actively participate in an intensive therapy program of at least 3 hrs of therapy 5 days a week? Yes 6. The potential for patient to make measurable gains while on inpatient rehab is excellent 7. Anticipated functional outcomes upon discharge from inpatients are: min assist PT, min assist and mod assist OT, min assist SLP 8. Estimated rehab length of stay to reach the above functional goals is: 7-10 days 9. Anticipated D/C setting: Home 10. Anticipated post D/C treatments: [place] therapy 11. Overall Rehab/Functional Prognosis: excellent  MD Signature: Meredith Staggers, MD, [place] [facility] 11/19/2018          Revision History Date/Time User Provider Type Action  11/19/2018 1:13 PM Meredith Staggers, MD Physician Sign  11/19/2018 1:00 PM Jhonnie Garner, OT Rehab Admission Coordinator Share  11/19/2018 12:21 PM Jhonnie Garner, [place] Rehab Admission Coordinator Share  11/19/2018 12:04 PM Jhonnie Garner, OT Rehab Admission Coordinator Share  11/19/2018 12:04 PM Jhonnie Garner, [place] Rehab Admission Coordinator Share  11/19/2018 12:02 PM Jhonnie Garner, [place] Rehab Admission Coordinator Share  View Details Report

## 2018-11-19 NOTE — Progress Notes (Addendum)
Inpatient Rehabilitation-Admissions Coordinator   Met with pt at the bedside. He is able to follow some commands and was resting in bed calmly with mittens donned. AC has insurance approval and medical clearance by Dr. Wynelle Cleveland for admission to CIR today. Pt's son and his wife want to pursue CIR at this time and consents signed and letter reviewed. AC will plan for admission to CIR today.   Please call if questions.   Jhonnie Garner, OTR/L  Rehab Admissions Coordinator  [phone] 11/19/2018 12:39 PM

## 2018-11-19 NOTE — Discharge Summary (Signed)
Physician Discharge Summary  RAEVON BROOM ZOX:[id number] DOB: [birth year] DOA: 11/14/2018  PCP: Greig Right, MD  Admit date: 11/14/2018 Discharge date: 11/19/2018  Admitted From: home Disposition:  CIR   Recommendations for Outpatient Follow-up:  1. F/u confusion, agitation, insomnia  Discharge Condition:  stable   CODE STATUS:  Full code   Diet recommendation:  Heart healthy, D 2 with thin liquids Consultations:  Neuro    Discharge Diagnoses:  Principal Problem:   ICH (intracerebral hemorrhage) ([place]) Active Problems:   Cytotoxic brain edema (H[facility])   Confusion   Dysphagia   Cortical blindness   Essential hypertension, benign   Coronary artery disease involving native coronary artery of native heart without angina pectoris   Dyslipidemia   BPH (benign prostatic hyperplasia)   Brief Summary: JHALIL SILVERA is an 83 y/o male with CAD s/p stenting, ICH in 2014 who was brought in by family who noted confusion and acting as if he could not see properly. CT of the head without contrast 8/2: 1. Multifocal acute intra-axial hemorrhages in both posterior cerebral hemispheres, larger on the right (where collective blood volume is estimated at 55 mL). Underlying posterior right hemisphere encephalomalacia from a 2014 intra-axial hemorrhage. Additional subtle 9-10 mm hyperdense mass-like area in the posterior left cingulate. 2. Associated cerebral edema, but no midline shift or loss of basilar cisterns. No intraventricular extension. Trace extension into the subarachnoid space suspected. 3. 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  Hospital Course:  Active Problems:   ICH (intracerebral hemorrhage) with cytotoxic brain edema) -MRI brain- parenchymal hemorrhages posteriorly in both cerebral hemisphere with a small volume subarachnoid hemorrhage and multiple additional chronic hemorrhages which may reflect cerebral amyloid angiopathy,  punctate foci of diffusion abnormality in the left frontal lobe and left cerebellum possibly subacute infarcts -Carotid ultrasound 8/3- no significant stenosis noted - EEG 8/4- mild diffuse encephalopathy-no seizures or epileptiform activity -2D echo- no embolic source found - LDL is 96 triglycerides are also elevated at 168 and HDL is 40 -A1c is 5.8 -He has been placed on a dysphagia 2 diet with thin liquids which he is tolerating well - CIR is evaluating him and will be accepting him today -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-Continue to avoid all blood thinners   Confusion/ Agitation - related to CVA and newly being blind  - Depakote started by Neurology a few days ago is controlling most of his daytime agitation - will add Seroquel at bedtime as he has been restless at night and not sleeping- it will control his restlessness and maintain a circadian rhythm for him   Dysphagia -See above under ICH  SIRS - Mild temperature of 100.7 2 days ago - has not recurred-WBC count is 12 --continue to follow  HTN - Impaired Relaxation noted on 2D echo suggestive of diastolic heart failure and the need for better BP control - takes Toprol XL at home and is currently on metoprolol twice daily -Amlodipine 5 mg daily added just today - BP remains elevated please continue to follow adjust medications a needed -    BPH - on Flomax at home- he did need an I and O cath earlier on during the hospital stay- no urinary retention noted on bladder scan today  HLD -LDL is 96- he was on Crestor and fenofibrate at home which we are continuing   Hypokalemia - Have replaced this   Discharge Exam: Vitals:   11/19/18 0816 11/19/18  1115  BP: (!) 188/91 (!) 165/86  Pulse: 80 80  Resp: 18 16  Temp: 98.9 F (37.2 C) 98.8 F (37.1 C)  SpO2: 95% 97%   Vitals:   11/18/18 1936 11/19/18 0641 11/19/18 0816 11/19/18 1115  BP: (!) 160/82 (!) 147/90 (!) 188/91 (!) 165/86  Pulse: 79 79  80 80  Resp: 18 20 18 16   Temp: 98.9 F (37.2 C) 98.4 F (36.9 C) 98.9 F (37.2 C) 98.8 F (37.1 C)  TempSrc: Oral  Axillary Axillary  SpO2: 98% 94% 95% 97%  Weight:      Height:        General: Pt is alert, awake, not in acute distress Cardiovascular: RRR, S1/S2 +, no rubs, no gallops Respiratory: CTA bilaterally, no wheezing, no rhonchi Abdominal: Soft, NT, ND, bowel sounds + Extremities: no edema, no cyanosis   Discharge Instructions  Discharge Instructions    Diet - low sodium heart healthy   Complete by: As directed    Increase activity slowly   Complete by: As directed      Allergies as of 11/19/2018      Reactions   Ativan [lorazepam] Other (See Comments)   confusion      Medication List    STOP taking these medications   acetaminophen 325 MG tablet Commonly known as: TYLENOL   metoprolol succinate 50 MG 24 hr tablet Commonly known as: TOPROL-XL     TAKE these medications   amLODipine 5 MG tablet Commonly known as: NORVASC Take 1 tablet (5 mg total) by mouth daily. Start taking on: November 20, 2018   Calcium + D3 600-800 MG-UNIT Tabs Generic drug: Calcium Carb-Cholecalciferol Take 1 tablet by mouth daily.   divalproex 125 MG capsule Commonly known as: DEPAKOTE SPRINKLE Take 2 capsules (250 mg total) by mouth every 8 (eight) hours.   esomeprazole 40 MG capsule Commonly known as: NEXIUM Take 40 mg by mouth daily before breakfast.   fenofibrate 160 MG tablet Take 160 mg by mouth daily.   FISH OIL PO Take 1 capsule by mouth daily with breakfast.   Glucosamine Chondroitin Triple Tabs Take 1 tablet by mouth daily.   metoprolol tartrate 25 MG tablet Commonly known as: LOPRESSOR Take 1 tablet (25 mg total) by mouth 2 (two) times daily.   multivitamin with minerals Tabs tablet Take 1 tablet by mouth daily with breakfast.   nitroGLYCERIN 0.4 MG SL tablet Commonly known as: NITROSTAT Place 0.4 mg under the tongue.   QUEtiapine 25 MG  tablet Commonly known as: SEROquel Take 1 tablet (25 mg total) by mouth at bedtime.   rosuvastatin 40 MG tablet Commonly known as: CRESTOR Take 40 mg by mouth daily.   senna-docusate 8.6-50 MG tablet Commonly known as: Senokot-S Take 1 tablet by mouth 2 (two) times daily.   tamsulosin 0.4 MG Caps capsule Commonly known as: FLOMAX Take 1 capsule (0.4 mg total) by mouth daily after breakfast.       Allergies  Allergen Reactions  . Ativan [Lorazepam] Other (See Comments)    confusion     Procedures/Studies: 2 d ECHO Procedures:   2D echo 1. The left ventricle has normal systolic function, with an ejection fraction of 55-60%. The cavity size was normal. There is moderately increased left ventricular wall thickness. Left ventricular diastolic Doppler parameters are consistent with  impaired relaxation. No evidence of left ventricular regional wall motion abnormalities. 2. The right ventricle has normal systolic function. The cavity was normal. There is no increase  in right ventricular wall thickness. 3. Trivial pericardial effusion is present. 4. No evidence of mitral valve stenosis. Trivial mitral regurgitation. 5. The aortic valve is tricuspid. Moderate calcification of the aortic valve. No stenosis of the aortic valve. 6. The aorta is normal in size and structure. 7. The IVC was normal in size. No complete TR doppler jet so unable to estimate PA systolic pressure.  Ct Head Wo Contrast  Result Date: 11/15/2018 CLINICAL DATA:  Follow-up ICH, headache, altered mental status EXAM: CT HEAD WITHOUT CONTRAST TECHNIQUE: Contiguous axial images were obtained from the base of the skull through the vertex without intravenous contrast. COMPARISON:  CT brain, 11/14/2018 FINDINGS: Brain: There is redemonstrated multifocal acute parenchymal hemorrhage. A large hematoma of the right frontotemporoparietal junction is enlarged, measuring 5.4 x 3.7 cm, previously 3.8 x 3.7 cm when measured  similarly (series 3, image 17). An adjacent focus of hemorrhage in the right occipital lobe is not significantly changed measuring 2.2 x 1.3 cm (series 3, image 16). Hemorrhage of the left parietooccipital lobe is not significantly changed measuring 3.4 x 2.1 cm (series 3, image 13). A very small focus of hemorrhage seen on prior examination in the medial posterior left frontal lobe is not well appreciated (series 3, image 26). There is minimal bilateral subarachnoid hemorrhage without substantial subdural collection. There is mild mass effect on the right lateral ventricle without significant midline shift. Underlying small-vessel white matter disease. Unchanged right occipital encephalomalacia. Vascular: No hyperdense vessel or unexpected calcification. Skull: Normal. Negative for fracture or focal lesion. Sinuses/Orbits: No acute finding. Other: None. IMPRESSION: 1. There is redemonstrated multifocal acute parenchymal hemorrhage. A large hematoma of the right frontotemporoparietal junction is enlarged, measuring 5.4 x 3.7 cm, previously 3.8 x 3.7 cm when measured similarly (series 3, image 17). 2. An adjacent focus of hemorrhage in the right occipital lobe is not significantly changed measuring 2.2 x 1.3 cm (series 3, image 16). Hemorrhage of the left parietooccipital lobe is not significantly changed measuring 3.4 x 2.1 cm (series 3, image 13). A very small focus of hemorrhage seen on prior examination in the medial posterior left frontal lobe is not well appreciated (series 3, image 26). Distribution of multifocal parenchymal hemorrhage again suggests amyloid angiopathy. 3. There is minimal bilateral subarachnoid hemorrhage without substantial subdural collection. There is mild mass effect on the right lateral ventricle without significant midline shift. 4. Underlying small-vessel white matter disease. Unchanged right occipital encephalomalacia. Electronically Signed   By: Eddie Candle M.D.   On: 11/15/2018  11:27   Mr Brain Wo Contrast  Result Date: 11/15/2018 CLINICAL DATA:  Cerebral hemorrhage. EXAM: MRI HEAD WITHOUT CONTRAST TECHNIQUE: Multiplanar, multiecho pulse sequences of the brain and surrounding structures were obtained without intravenous contrast. COMPARISON:  Head CT 11/14/2018 and MRI 03/05/2013 FINDINGS: The examination had to be discontinued prior to completion due to the patient's confusion and attempts to climb off the MRI table despite use of medication and immobilization. Axial and coronal diffusion sequences and susceptibility weighted imaging were obtained. A 2 mm focus of trace diffusion hyperintensity involves cortex of the left superior frontal gyrus, and a similar 2 mm focus is present in the left cerebellar hemisphere. ADC is not clearly reduced although assessment is limited by small size. These do not correspond to hemorrhages based on the SWI sequence. A large acute parenchymal hemorrhage in the right temporal lobe and smaller acute hemorrhages in both occipital lobes are similar in size to today's CT with associated edema and regional  mass effect as described on that examination. There is no significant midline shift. There is hemosiderin stained encephalomalacia in the right occipital lobe related to an old hemorrhage. Multiple additional small chronic hemorrhages are present peripherally in both cerebral hemispheres, not apparent on the 2014 MRI although susceptibility weighted imaging was not performed on that study. Chronic microhemorrhages are also present in the cerebellum bilaterally. The thalami are spared, and there are at most 1 or 2 chronic microhemorrhages in the basal ganglia (versus vascular susceptibility). Scattered sulcal susceptibility artifact is present bilaterally corresponding to small volume subarachnoid hemorrhage on CT. IMPRESSION: 1. Incomplete examination. 2. Parenchymal hemorrhages posteriorly in both cerebral hemispheres with associated small volume  subarachnoid hemorrhage as described on today's CT. 3. Multiple additional chronic hemorrhages which may reflect cerebral amyloid angiopathy. 4. Punctate foci of diffusion abnormality in the left frontal lobe and left cerebellum, possibly subacute infarcts. Electronically Signed   By: Logan Bores M.D.   On: 11/15/2018 11:27   Dg Chest Port 1 View  Result Date: 11/15/2018 CLINICAL DATA:  Metastasis, history hypertension, stroke, coronary artery disease post stenting at EXAM: PORTABLE CHEST 1 VIEW COMPARISON:  Portable exam 1108 hours compared to 03/01/2013 FINDINGS: Upper normal size of cardiac silhouette. Mediastinal contours and pulmonary vascularity normal. Atherosclerotic calcification aorta. RIGHT basilar atelectasis. Lungs otherwise clear. No definite infiltrate, pleural effusion or pneumothorax. Bones demineralized with degenerative disc disease changes of the thoracic spine. IMPRESSION: RIGHT basilar atelectasis. Electronically Signed   By: Lavonia Dana M.D.   On: 11/15/2018 11:26   Ct Head Code Stroke Wo Contrast  Addendum Date: 11/14/2018   ADDENDUM REPORT: 11/14/2018 08:42 ADDENDUM: Study discussed by telephone with Dr. Cheral Marker on 11/14/2018 at 0827 hours. Electronically Signed   By: Genevie Ann M.D.   On: 11/14/2018 08:42   Result Date: 11/14/2018 CLINICAL DATA:  Code stroke. 83 year old male with sudden onset vision loss in both eyes. EXAM: CT HEAD WITHOUT CONTRAST TECHNIQUE: Contiguous axial images were obtained from the base of the skull through the vertex without intravenous contrast. COMPARISON:  Brain MRI 03/05/2013. [facility] Head CT 09/23/2013. FINDINGS: Brain: Multifocal acute intra-axial hemorrhages in both cerebral hemispheres. Blood volume is greater on the right, where there is a confluent hemorrhage occupying the posterior right temporal lobe encompassing 46 x 48 by 45 millimeters (AP by transverse by CC). Smaller multifocal acute hemorrhages are present in an area of chronic right  occipital pole encephalomalacia, the larger is 21 millimeters diameter on series 3, image 18. In the contralateral left lateral occipital lobe there is heterogeneous hyperdense hemorrhage encompassing 36 by 22 by 42 millimeters (AP by transverse by CC). There is associated parenchymal edema at each acute hemorrhage site. Underlying chronic right parietal and occipital lobe encephalomalacia. Mass effect on the occipital and temporal horns, but no ventriculomegaly. No intraventricular extension of blood. No midline shift. Basilar cisterns remain normal. Trace if any extra-axial extension of blood (perhaps some subarachnoid along the right sylvian fissure). On the 2014 brain MRI hemosiderin was present throughout the posterior right temporal and anterior right occipital lobes. However, there is also a subtle 9-10 millimeter hyperdense mass in the posterior left cingulate on series 3, image 22, and it is unclear whether this might be a small area of acute or chronic hemorrhage. The brainstem and cerebellum are spared. There is Patchy and confluent bilateral cerebral white matter hypodensity which is chronic. No superimposed acute cortically based infarct identified. Vascular: Extensive Calcified atherosclerosis at the skull base. No suspicious intracranial  vascular hyperdensity. Skull: No acute or suspicious osseous lesion identified. Sinuses/Orbits: Visualized paranasal sinuses and mastoids are stable and well pneumatized. Other: Chronic postoperative changes to both globes. Bilateral orbits soft tissues appears stable and negative. Visualized scalp soft tissues are within normal limits. ASPECTS [facility] Stroke Program Early CT Score) Total score (0-10 with 10 being normal): Not applicable, acute hemorrhage. IMPRESSION: 1. Multifocal acute intra-axial hemorrhages in both posterior cerebral hemispheres, larger on the right (where collective blood volume is estimated at 55 mL). Underlying posterior right hemisphere  encephalomalacia from a 2014 intra-axial hemorrhage. Additional subtle 9-10 mm hyperdense mass-like area in the posterior left cingulate. 2. Associated cerebral edema, but no midline shift or loss of basilar cisterns. No intraventricular extension. Trace extension into the subarachnoid space suspected. 3. 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 Brain MRI without and with contrast recommended when feasible to further characterize. 4. Underlying chronic white matter disease. Neurology Dr. Cheral Marker was sent a text page via the [facility] messaging system regarding critical findings on this exam at 8:02 am on 11/14/2018. Electronically Signed: By: Genevie Ann M.D. On: 11/14/2018 08:06   Vas US Carotid  Result Date: 11/16/2018 Carotid Arterial Duplex Study Indications:       CVA. Risk Factors:      Coronary artery disease, prior CVA. Comparison Study:  no prior Performing Technologist: Abram Sander RVS  Examination Guidelines: A complete evaluation includes B-mode imaging, spectral Doppler, color Doppler, and power Doppler as needed of all accessible portions of each vessel. Bilateral testing is considered an integral part of a complete examination. Limited examinations for reoccurring indications may be performed as noted.  Right Carotid Findings: +----------+--------+--------+--------+------------------------+--------+           PSV cm/sEDV cm/sStenosisDescribe                Comments +----------+--------+--------+--------+------------------------+--------+ CCA Prox  93                      homogeneous                      +----------+--------+--------+--------+------------------------+--------+ CCA Distal72      10              homogeneous                      +----------+--------+--------+--------+------------------------+--------+ ICA Prox  163     19      1-39%   homogeneous and calcific          +----------+--------+--------+--------+------------------------+--------+ ICA Distal80      9                                                +----------+--------+--------+--------+------------------------+--------+ ECA       76                                                       +----------+--------+--------+--------+------------------------+--------+ +----------+--------+-------+--------+-------------------+           PSV cm/sEDV cmsDescribeArm Pressure (mmHG) +----------+--------+-------+--------+-------------------+ [id number]                                        +----------+--------+-------+--------+-------------------+ +---------+--------+--+--------+-+---------+  VertebralPSV cm/s44EDV cm/s8Antegrade +---------+--------+--+--------+-+---------+  Left Carotid Findings: +----------+--------+--------+--------+-----------+--------+           PSV cm/sEDV cm/sStenosisDescribe   Comments +----------+--------+--------+--------+-----------+--------+ CCA Prox  83                      homogeneous         +----------+--------+--------+--------+-----------+--------+ CCA Distal71      10              homogeneous         +----------+--------+--------+--------+-----------+--------+ ICA Prox  51      9       1-39%   homogeneous         +----------+--------+--------+--------+-----------+--------+ ICA Distal37      7                                   +----------+--------+--------+--------+-----------+--------+ ECA       106                                         +----------+--------+--------+--------+-----------+--------+ +----------+--------+--------+--------+-------------------+ SubclavianPSV cm/sEDV cm/sDescribeArm Pressure (mmHG) +----------+--------+--------+--------+-------------------+           88                                          +----------+--------+--------+--------+-------------------+  +---------+--------+--+--------+--+---------+ VertebralPSV cm/s82EDV cm/s14Antegrade +---------+--------+--+--------+--+---------+  Summary: Right Carotid: Velocities in the right ICA are consistent with a 1-39% stenosis. Left Carotid: Velocities in the left ICA are consistent with a 1-39% stenosis. Vertebrals: Bilateral vertebral arteries demonstrate antegrade flow. *See table(s) above for measurements and observations.  Electronically signed by Antony Contras MD on 11/16/2018 at 1:10:17 PM.    Final      The results of significant diagnostics from this hospitalization (including imaging, microbiology, ancillary and laboratory) are listed below for reference.     Microbiology: Recent Results (from the past 240 hour(s))  SARS Coronavirus 2 [facility] order, Performed in [facility] hospital lab) Nasopharyngeal Nasopharyngeal Swab     Status: None   Collection Time: 11/14/18 12:07 PM   Specimen: Nasopharyngeal Swab  Result Value Ref Range Status   SARS Coronavirus 2 NEGATIVE NEGATIVE Final    Comment: (NOTE) If result is NEGATIVE SARS-CoV-2 target nucleic acids are NOT DETECTED. The SARS-CoV-2 RNA is generally detectable in upper and lower  respiratory specimens during the acute phase of infection. The lowest  concentration of SARS-CoV-2 viral copies this assay can detect is 250  copies / mL. A negative result does not preclude SARS-CoV-2 infection  and should not be used as the sole basis for treatment or other  patient management decisions.  A negative result may occur with  improper specimen collection / handling, submission of specimen other  than nasopharyngeal swab, presence of viral mutation(s) within the  areas targeted by this assay, and inadequate number of viral copies  (<250 copies / mL). A negative result must be combined with clinical  observations, patient history, and epidemiological information. If result is POSITIVE SARS-CoV-2 target nucleic acids are DETECTED. The  SARS-CoV-2 RNA is generally detectable in upper and lower  respiratory specimens dur ing the acute phase of infection.  Positive  results are indicative of active infection with SARS-CoV-2.  Clinical  correlation with patient history and other diagnostic information is  necessary to determine patient infection status.  Positive results do  not rule out bacterial infection or co-infection with other viruses. If result is PRESUMPTIVE POSTIVE SARS-CoV-2 nucleic acids MAY BE PRESENT.   A presumptive positive result was obtained on the submitted specimen  and confirmed on repeat testing.  While 2019 novel coronavirus  (SARS-CoV-2) nucleic acids may be present in the submitted sample  additional confirmatory testing may be necessary for epidemiological  and / or clinical management purposes  to differentiate between  SARS-CoV-2 and other Sarbecovirus currently known to infect humans.  If clinically indicated additional testing with an alternate test  methodology [phone]) is advised. The SARS-CoV-2 RNA is generally  detectable in upper and lower respiratory sp ecimens during the acute  phase of infection. The expected result is Negative. Fact Sheet for Patients:  StrictlyIdeas.no Fact Sheet for Healthcare Providers: BankingDealers.co.za This test is not yet approved or cleared by the [place] FDA and has been authorized for detection and/or diagnosis of SARS-CoV-2 by FDA under an Emergency Use Authorization (EUA).  This EUA will remain in effect (meaning this test can be used) for the duration of the COVID-19 declaration under Section 564(b)(1) of the Act, 21 U.S.C. section 360bbb-3(b)(1), unless the authorization is terminated or revoked sooner. Performed at [facility], [place] [address] [place], [place] [address]   MRSA PCR Screening     Status: None   Collection Time: 11/14/18  1:10 PM   Specimen: Nasal Mucosa; Nasopharyngeal   Result Value Ref Range Status   MRSA by PCR NEGATIVE NEGATIVE Final    Comment:        The GeneXpert MRSA Assay (FDA approved for NASAL specimens only), is one component of a comprehensive MRSA colonization surveillance program. It is not intended to diagnose MRSA infection nor to guide or monitor treatment for MRSA infections. Performed at [facility], [place] [address] [place], [place] [address]      Labs: BNP (last 3 results) No results for input(s): BNP in the last 8760 hours. Basic Metabolic Panel: Recent Labs  Lab 11/14/18 0742 11/14/18 0749 11/17/18 0449 11/19/18 0724  NA 139 139 137 135  K 3.7 3.6 3.2* 3.7  CL 103 104 101 103  CO2 21*  --  22 19*  GLUCOSE 137* 138* 111* 100*  BUN 13 16 14 9   CREATININE 1.17 1.10 0.96 0.86  CALCIUM 10.3  --  9.0 8.9   Liver Function Tests: Recent Labs  Lab 11/14/18 0742  AST 46*  ALT 42  ALKPHOS 44  BILITOT 0.9  PROT 7.6  ALBUMIN 4.4   No results for input(s): LIPASE, AMYLASE in the last 168 hours. No results for input(s): AMMONIA in the last 168 hours. CBC: Recent Labs  Lab 11/14/18 0742 11/14/18 0749 11/17/18 0449  WBC 9.9  --  12.1*  NEUTROABS 7.6  --   --   HGB 14.6 15.0 15.0  HCT 44.3 44.0 43.2  MCV 96.1  --  92.3  PLT 184  --  186   Cardiac Enzymes: No results for input(s): CKTOTAL, CKMB, CKMBINDEX, TROPONINI in the last 168 hours. BNP: Invalid input(s): POCBNP CBG: No results for input(s): GLUCAP in the last 168 hours. D-Dimer No results for input(s): DDIMER in the last 72 hours. Hgb A1c No results for input(s): HGBA1C in the last 72 hours. Lipid Profile No results for input(s): CHOL, HDL, LDLCALC, TRIG, CHOLHDL,  LDLDIRECT in the last 72 hours. Thyroid function studies No results for input(s): TSH, T4TOTAL, T3FREE, THYROIDAB in the last 72 hours.  Invalid input(s): FREET3 Anemia work up No results for input(s): VITAMINB12, FOLATE, FERRITIN, TIBC, IRON, RETICCTPCT in the last 72  hours. Urinalysis    Component Value Date/Time   COLORURINE STRAW (A) 11/14/2018 1220   APPEARANCEUR CLEAR 11/14/2018 1220   LABSPEC 1.009 11/14/2018 1220   PHURINE 7.0 11/14/2018 1220   GLUCOSEU NEGATIVE 11/14/2018 1220   HGBUR NEGATIVE 11/14/2018 1220   BILIRUBINUR NEGATIVE 11/14/2018 1220   KETONESUR NEGATIVE 11/14/2018 1220   PROTEINUR NEGATIVE 11/14/2018 1220   UROBILINOGEN 0.2 03/24/2013 1300   NITRITE NEGATIVE 11/14/2018 1220   LEUKOCYTESUR NEGATIVE 11/14/2018 1220   Sepsis Labs Invalid input(s): PROCALCITONIN,  WBC,  LACTICIDVEN Microbiology Recent Results (from the past 240 hour(s))  SARS Coronavirus 2 [facility] order, Performed in [facility] hospital lab) Nasopharyngeal Nasopharyngeal Swab     Status: None   Collection Time: 11/14/18 12:07 PM   Specimen: Nasopharyngeal Swab  Result Value Ref Range Status   SARS Coronavirus 2 NEGATIVE NEGATIVE Final    Comment: (NOTE) If result is NEGATIVE SARS-CoV-2 target nucleic acids are NOT DETECTED. The SARS-CoV-2 RNA is generally detectable in upper and lower  respiratory specimens during the acute phase of infection. The lowest  concentration of SARS-CoV-2 viral copies this assay can detect is 250  copies / mL. A negative result does not preclude SARS-CoV-2 infection  and should not be used as the sole basis for treatment or other  patient management decisions.  A negative result may occur with  improper specimen collection / handling, submission of specimen other  than nasopharyngeal swab, presence of viral mutation(s) within the  areas targeted by this assay, and inadequate number of viral copies  (<250 copies / mL). A negative result must be combined with clinical  observations, patient history, and epidemiological information. If result is POSITIVE SARS-CoV-2 target nucleic acids are DETECTED. The SARS-CoV-2 RNA is generally detectable in upper and lower  respiratory specimens dur ing the acute phase of infection.   Positive  results are indicative of active infection with SARS-CoV-2.  Clinical  correlation with patient history and other diagnostic information is  necessary to determine patient infection status.  Positive results do  not rule out bacterial infection or co-infection with other viruses. If result is PRESUMPTIVE POSTIVE SARS-CoV-2 nucleic acids MAY BE PRESENT.   A presumptive positive result was obtained on the submitted specimen  and confirmed on repeat testing.  While 2019 novel coronavirus  (SARS-CoV-2) nucleic acids may be present in the submitted sample  additional confirmatory testing may be necessary for epidemiological  and / or clinical management purposes  to differentiate between  SARS-CoV-2 and other Sarbecovirus currently known to infect humans.  If clinically indicated additional testing with an alternate test  methodology [phone]) is advised. The SARS-CoV-2 RNA is generally  detectable in upper and lower respiratory sp ecimens during the acute  phase of infection. The expected result is Negative. Fact Sheet for Patients:  StrictlyIdeas.no Fact Sheet for Healthcare Providers: BankingDealers.co.za This test is not yet approved or cleared by the [place] FDA and has been authorized for detection and/or diagnosis of SARS-CoV-2 by FDA under an Emergency Use Authorization (EUA).  This EUA will remain in effect (meaning this test can be used) for the duration of the COVID-19 declaration under Section 564(b)(1) of the Act, 21 U.S.C. section 360bbb-3(b)(1), unless the authorization is terminated or  revoked sooner. Performed at [facility], [place] [address], [place], [place] [id number]   MRSA PCR Screening     Status: None   Collection Time: 11/14/18  1:10 PM   Specimen: Nasal Mucosa; Nasopharyngeal  Result Value Ref Range Status   MRSA by PCR NEGATIVE NEGATIVE Final    Comment:        The GeneXpert MRSA Assay  (FDA approved for NASAL specimens only), is one component of a comprehensive MRSA colonization surveillance program. It is not intended to diagnose MRSA infection nor to guide or monitor treatment for MRSA infections. Performed at [facility], [place] [address], [place], [place] [address]      Time coordinating discharge in minutes: 65  SIGNED:   Debbe Odea, MD  [facility] 11/19/2018, 11:59 AM Pager   If 7PM-7AM, please contact night-coverage [email or web address] Password [email or web address]

## 2018-11-20 ENCOUNTER — Inpatient Hospital Stay (HOSPITAL_COMMUNITY): Payer: Medicare HMO | Admitting: Occupational Therapy

## 2018-11-20 ENCOUNTER — Inpatient Hospital Stay (HOSPITAL_COMMUNITY): Payer: Medicare HMO | Admitting: Speech Pathology

## 2018-11-20 ENCOUNTER — Inpatient Hospital Stay (HOSPITAL_COMMUNITY): Payer: Medicare HMO | Admitting: Physical Therapy

## 2018-11-20 DIAGNOSIS — R451 Restlessness and agitation: Secondary | ICD-10-CM

## 2018-11-20 NOTE — Plan of Care (Signed)
  Problem: RH BOWEL ELIMINATION Goal: RH STG MANAGE BOWEL WITH ASSISTANCE Description: STG Manage Bowel with Assistance. Mod Outcome: Progressing   Problem: RH SKIN INTEGRITY Goal: RH STG MAINTAIN SKIN INTEGRITY WITH ASSISTANCE Description: STG Maintain Skin Integrity With Assistance. Mod Outcome: Progressing   Problem: RH SAFETY Goal: RH STG ADHERE TO SAFETY PRECAUTIONS W/ASSISTANCE/DEVICE Description: STG Adhere to Safety Precautions With Assistance/Device. Max Outcome: Progressing   Problem: RH BLADDER ELIMINATION Goal: RH STG MANAGE BLADDER WITH ASSISTANCE Description: STG Manage Bladder With Assistance. Mod Outcome: Not Progressing   Problem: RH COGNITION-NURSING Goal: RH STG USES MEMORY AIDS/STRATEGIES W/ASSIST TO PROBLEM SOLVE Description: STG Uses Memory Aids/Strategies With Assistance to Problem Solve. Mod Outcome: Not Progressing   Problem: Consults Goal: RH BRAIN INJURY PATIENT EDUCATION Description: Description: See Patient Education module for eduction specifics Outcome: Not Progressing

## 2018-11-20 NOTE — Evaluation (Addendum)
Speech Language Pathology Assessment and Plan  Patient Details  Name: Walter Larsen MRN: [id number] Date of Birth: 11-23-1935  SLP Diagnosis: Cognitive Impairments  Rehab Potential: Poor ELOS: 18 to 21 days    Today's Date: 11/20/2018 SLP Individual Time: 7654-6503; 5465-6812 SLP Individual Time Calculation (min): 22 min; 20 min  Problem List:  Patient Active Problem List   Diagnosis Date Noted  . Confusion 11/19/2018  . Dysphagia 11/19/2018  . 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 BPH (benign prostatic hyperplasia) 11/18/2018  . 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 Coronary artery disease involving native coronary artery of native heart without angina pectoris 01/10/2015  . Dyslipidemia 01/10/2015  . Cognitive deficits, late effect of cerebrovascular disease 11/07/2013  . Left homonymous hemianopsia 05/10/2013  . Presence of IVC filter 05/10/2013  . ICH (intracerebral hemorrhage) ([place]) 03/15/2013  . Dry eyes 03/08/2013  . Hyponatremia 03/08/2013  . Headache 03/08/2013  . Acute encephalopathy 03/08/2013  . Pulmonary emboli ([place]) 03/08/2013  . Other and unspecified hyperlipidemia 03/02/2013  . Bradycardia 03/02/2013  . Hypokalemia 03/02/2013  . Hemorrhagic stroke ([place]) 03/01/2013  . Essential hypertension, benign 03/01/2013  . Fever, unspecified 03/01/2013   Past Medical History:  Past Medical History:  Diagnosis Date  . Coronary artery disease    3 stents  . ICH (intracerebral hemorrhage) ([place]) 2014   Past Surgical History:  Past Surgical History:  Procedure Laterality Date  . TEE WITHOUT CARDIOVERSION N/A 03/07/2013   Procedure: TRANSESOPHAGEAL ECHOCARDIOGRAM (TEE);  Surgeon: Fay Records, MD;  Location: [facility] ENDOSCOPY;  Service: Cardiovascular;  Laterality: N/A;    Assessment / Plan / Recommendation Clinical Impression  Walter Larsen isan 83 year old male with history of CADs/pstenting,ICH 2014 with residual visual deficits, BLE DVTs,who was admitted on  11/14/2018 with acute onset of total blindness. MRI brain showed parenchymal hemorrhages posteriorly in both cerebral hemispheres with multiple additional chronic hemorrhages and punctate foci of diffusion abnormality left frontal lobe and left cerebellum. CT head showed large hematoma right frontotemporoparietal junction with adjacent focus of hemorrhage right occipital lobe and left parieto-occipital lobe,minimal bilateral subarachnoid hemorrhage with bleed suggestive of amyloid angiopathy.2D echo with EF of 55 to 60% with moderate increase in LV and moderate calcification on aortic valve. Patient has had issues with confusion,disorientation and bouts of agitation. EEG done revealing mild diffuse encephalopathy and was negative for seizures.Swallow evaluation completed and he was started on dysphagia 2 with thin liquids. Neurology felt bleeding secondary to cerebral amyloid angiopathy doubt metastases ---not a candidate for antithromboticsoranticoagulation.Patient continues to have cortical blindness with lack of awareness, confusion and oriented to self only, is showing improvement in ability to follow one-step command however continues to have deficits in mobility as well as ability to carry out ADLs. CIR recommended due to functional decline.  11/20/18 Currently, pt presents with significant lethargy and inability to arouse. Despite sternal rub, tactile and verbal cues, pt only grunted "uh" in response to his name. Pt is not currently medicated with anything sedative. Will establish initial ST POC for cognition but will likely need ongoing assessment for cognition as well as dysphagia as pt's attention allows. ST to follow while on CIR.  Later in the day SLP made aware that pt was alert and consuming dysphagia 2 lunch tray but was not effectively clearing oral residue with dysphagia 2 diet textures. SLP provided BSE. Pt presents with what appears to be cognitive related oral phase impairments  c/b inattention to dysphagia 2 bolus and when opening his mouth, he presents  with poor bolus management as minced meat was widespread throughout mouth. When consuming puree items, pt with decreased oral prep time and effective oral clearing. Pt continued to required Max A cues for overall attention to task, Total A for holding cup in his hand and bringing to mouth and he benefits from having spoon touch lips prior to trying to enter mouth. Diet downgraded to dysphagia 1.    Skilled Therapeutic Interventions          Extensive time devoted to promoting arousal with little success.  During second session, BSE completed and education provided to nursing staff.    SLP Assessment  Patient will need skilled [facility] during CIR admission    Recommendations  Recommendations for Other Services: Neuropsych consult Patient destination: (TBD) Follow up Recommendations: (TBD)    SLP Frequency 3 to 5 out of 7 days   SLP Duration  SLP Intensity  SLP Treatment/Interventions 18 to 21 days  Minumum of 1-2 x/day, 30 to 90 minutes  Cognitive remediation/compensation;Environmental controls;Patient/family education;Internal/external aids, dysphagia therapy    Pain None indicated d/t decreased arousal  Prior Functioning Cognitive/Linguistic Baseline: Information not available Type of Home: House  Lives With: Spouse Available Help at Discharge: Family Vocation: Retired  Industrial/product designer Term Goals: Week 1: SLP Short Term Goal 1 (Week 1): Pt will sustain attention to basic familiar task for ~ 1 minute with Max A multimodal cues. SLP Short Term Goal 2 (Week 1): Pt will follow 1 step basic directions in 2 out of 10 opportunities with Max A multimodal cues.  SLP Short Term Goal 3 (Week 1): Pt will consume current diet with minimal overt s/s of aspiration and Mod A cues for use of compensatory swallow strategies.   Refer to Care Plan for Long Term Goals  Recommendations for other  services: Neuropsych  Discharge Criteria: Patient will be discharged from SLP if patient refuses treatment 3 consecutive times without medical reason, if treatment goals not met, if there is a change in medical status, if patient makes no progress towards goals or if patient is discharged from hospital.  The above assessment, treatment plan, treatment alternatives and goals were discussed and mutually agreed upon: No family available/patient unable  Walter Larsen 11/20/2018, 9:48 AM

## 2018-11-20 NOTE — Progress Notes (Signed)
Patient very drowsy this morning and difficult to arouse. With encouragement and max assist patient sitting up on edge of bed. Patient able to take medications crushed and drink a few sips of liquid.  No difficulty noted. IV leaking so discontinued this morning. Patient responsive to questions and oriented to self only. Patient laying back down. Enclosure Bed secure. Continue to monitor.

## 2018-11-20 NOTE — Progress Notes (Signed)
[place] PHYSICAL MEDICINE & REHABILITATION PROGRESS NOTE   Subjective/Complaints: Pt slept most of night. Still resting when I came in this morning. Tolerated enclosure bed  ROS: Limited due to cognitive/behavioral    Objective:   No results found. No results for input(s): WBC, HGB, HCT, PLT in the last 72 hours. Recent Labs    11/19/18 0724  NA 135  K 3.7  CL 103  CO2 19*  GLUCOSE 100*  BUN 9  CREATININE 0.86  CALCIUM 8.9    Intake/Output Summary (Last 24 hours) at 11/20/2018 1022 Last data filed at 11/20/2018 0400 Gross per 24 hour  Intake -  Output 1300 ml  Net -1300 ml     Physical Exam: Vital Signs Blood pressure 130/70, pulse 79, temperature 97.7 F (36.5 C), temperature source Oral, resp. rate 18, SpO2 96 %. Constitutional: No distress . Vital signs reviewed. HEENT: EOMI, oral membranes moist Neck: supple Cardiovascular: RRR without murmur. No JVD    Respiratory: CTA Bilaterally without wheezes or rales. Normal effort    GI: BS +, non-tender, non-distended  Genitourinary: Genitourinary Comments: Condom cath Musculoskeletal:  General: No edema.  Neurological: Hard to arouse. Responds to verbal stim. Moves all 4's.  Skin: Skin iswarmand dry.  Psychiatric: Sedated    Assessment/Plan: 1. Functional deficits secondary to bilateral posterior circulation infarcts which require 3+ hours per day of interdisciplinary therapy in a comprehensive inpatient rehab setting.  Physiatrist is providing close team supervision and 24 hour management of active medical problems listed below.  Physiatrist and rehab team continue to assess barriers to discharge/monitor patient progress toward functional and medical goals  Care Tool:  Bathing    Body parts bathed by patient: Right arm, Left arm, Chest, Abdomen, Right upper leg, Left upper leg, Face   Body parts bathed by helper: Front perineal area, Buttocks, Right lower leg, Left lower leg      Bathing assist Assist Level: Moderate Assistance - Patient 50 - 74%     Upper Body Dressing/Undressing Upper body dressing   What is the patient wearing?: Pull over shirt    Upper body assist Assist Level: Total Assistance - Patient < 25%    Lower Body Dressing/Undressing Lower body dressing      What is the patient wearing?: Pants     Lower body assist Assist for lower body dressing: Total Assistance - Patient < 25%     Toileting Toileting    Toileting assist Assist for toileting: Dependent - Patient 0%     Transfers Chair/bed transfer  Transfers assist     Chair/bed transfer assist level: Maximal Assistance - Patient 25 - 49%     Locomotion Ambulation   Ambulation assist              Walk 10 feet activity   Assist           Walk 50 feet activity   Assist           Walk 150 feet activity   Assist           Walk 10 feet on uneven surface  activity   Assist           Wheelchair     Assist               Wheelchair 50 feet with 2 turns activity    Assist            Wheelchair 150 feet activity     Assist  Medical Problem List and Plan: 1.Functional deficitssecondary to [place] with cortical blindness and confusion --Patient is beginning CIR therapies today including PT, OT, and SLP  -enclosure bed for safety 2. Antithrombotics: -DVT/anticoagulation:Mechanical:Sequential compression devices, below kneeBilateral lower extremities -antiplatelet therapy: N/A 3. Pain Management:N/A 4. Mood:LCSW to follow for evaluation and support when appropriate -antipsychotic agents: continue scheduled seroquel at HS -maintain sleep chart, need to re-establish sleep/wake cycle. Hopefully with sleep last night, we can get him back on schedule. Let's see how he looks today as he gets up with therapy 5. Neuropsych: This patientis  notcapable of making decisions on hisown behalf. 6. Skin/Wound Care:Routine pressure relief measures 7. Fluids/Electrolytes/Nutrition:Monitor I's and O's. Offer supplements between meals 8.KOE:CXFQHKU 3 times daily. Continue amlodipine and Lopressor. Titrate medications for better control 9.Agitation: Continue Depakote every 8 hours. Check level on Monday  -seroquel as above 10.GERD: On Protonix 11.Leukocytosis: Monitor for signs of infection. 12. Urinary retention: Continue Flomax.   -need ua, ucx  - Monitor voiding with bladder scan and cath for volumes > 350 cc   `-still retaining quite a bit  LOS: 1 days A FACE TO FACE EVALUATION WAS PERFORMED  Meredith Staggers 11/20/2018, 10:22 AM

## 2018-11-20 NOTE — Evaluation (Signed)
Occupational Therapy Assessment and Plan  Patient Details  Name: Walter Larsen MRN: [id number] Date of Birth: October 08, 1935  OT Diagnosis: altered mental status, blindness and low vision, cognitive deficits, lumbago (low back pain) and muscle weakness (generalized) Rehab Potential: Rehab Potential (ACUTE ONLY): Fair ELOS: 14-16 days   Today's Date: 11/20/2018 OT Individual Time: 9371-6967 OT Individual Time Calculation (min): 64 min  and Today's Date: 11/20/2018 OT Missed Time: 11 Minutes Missed Time Reason: Patient fatigue;Pain    Problem List:  Patient Active Problem List   Diagnosis Date Noted  . Confusion 11/19/2018  . Dysphagia 11/19/2018  . 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 BPH (benign prostatic hyperplasia) 11/18/2018  . Cytotoxic brain edema ([place]) 11/15/2018  . Coronary artery disease involving native coronary artery of native heart without angina pectoris 01/10/2015  . Dyslipidemia 01/10/2015  . Cognitive deficits, late effect of cerebrovascular disease 11/07/2013  . Left homonymous hemianopsia 05/10/2013  . Presence of IVC filter 05/10/2013  . ICH (intracerebral hemorrhage) ([place]) 03/15/2013  . Dry eyes 03/08/2013  . Hyponatremia 03/08/2013  . Headache 03/08/2013  . Acute encephalopathy 03/08/2013  . Pulmonary emboli ([place]) 03/08/2013  . Other and unspecified hyperlipidemia 03/02/2013  . Bradycardia 03/02/2013  . Hypokalemia 03/02/2013  . Hemorrhagic stroke ([place]) 03/01/2013  . Essential hypertension, benign 03/01/2013  . Fever, unspecified 03/01/2013    Past Medical History:  Past Medical History:  Diagnosis Date  . Coronary artery disease    3 stents  . ICH (intracerebral hemorrhage) ([place]) 2014   Past Surgical History:  Past Surgical History:  Procedure Laterality Date  . TEE WITHOUT CARDIOVERSION N/A 03/07/2013   Procedure: TRANSESOPHAGEAL ECHOCARDIOGRAM (TEE);  Surgeon: Fay Records, MD;  Location: [facility] ENDOSCOPY;  Service: Cardiovascular;  Laterality: N/A;     Assessment & Plan Clinical Impression: Patient is a 83 y.o. year old male with history of CADs/pstenting,ICH 2014 with residual visual deficits, BLE DVTs,who was admitted on 11/14/2018 with acute onset of total blindness. MRI brain showed parenchymal hemorrhages posteriorly in both cerebral hemispheres with multiple additional chronic hemorrhages and punctate foci of diffusion abnormality left frontal lobe and left cerebellum. CT head showed large hematoma right frontotemporoparietal junction with adjacent focus of hemorrhage right occipital lobe and left parieto-occipital lobe,minimal bilateral subarachnoid hemorrhage with bleed suggestive of amyloid angiopathy.2D echo with EF of 55 to 60% with moderate increase in LV and moderate calcification on aortic valve. Carotid Dopplers were negative for significant ICA stenosis  Patient has had issues with confusion,disorientation and bouts of agitation. EEG done revealing mild diffuse encephalopathy and was negative for seizures.Elevated blood pressure was treated with Cleviprex this is been weaned off and he has been transitioned to metoprolol and amlodipine. Swallow evaluation completed and he was started on dysphagia 2 with thin liquids. Neurology felt bleeding secondary to cerebral amyloid angiopathy doubt metastases ---not a candidate for antithromboticsoranticoagulation.He has had issues with urinary retention--was cathed for 1100 blood tinged urine last night.Patient continues to have cortical blindness with lack of awareness, confusion and oriented to self only, is showing improvement in ability to follow one-step command however continues to have deficits in mobility as well as ability to carry out ADLs. CIR recommended due to functional decline.   Patient transferred to CIR on 11/19/2018 .    Patient currently requires max with basic self-care skills secondary to muscle weakness, impaired timing and sequencing and decreased motor  planning, decreased visual acuity, decreased visual perceptual skills and cortical blindness, decreased initiation, decreased attention, decreased awareness, decreased problem  solving, decreased safety awareness, decreased memory and delayed processing and decreased standing balance, decreased postural control and decreased balance strategies.  Prior to hospitalization, patient could complete ADLs with independent .  Patient will benefit from skilled intervention to decrease level of assist with basic self-care skills prior to discharge home with care partner.  Anticipate patient will require 24 hour supervision and minimal physical assistance and follow up home health.  OT - End of Session Activity Tolerance: Tolerates 30+ min activity with multiple rests Endurance Deficit: Yes Endurance Deficit Description: lethargy and pain impacting participation OT Assessment Rehab Potential (ACUTE ONLY): Fair OT Barriers to Discharge: Other (comments) OT Barriers to Discharge Comments: vision OT Patient demonstrates impairments in the following area(s): Balance;Behavior;Cognition;Endurance;Motor;Pain;Perception;Safety;Vision OT Basic ADL's Functional Problem(s): Eating;Grooming;Bathing;Dressing OT Transfers Functional Problem(s): Toilet;Tub/Shower OT Additional Impairment(s): Fuctional Use of Upper Extremity OT Plan OT Intensity: Minimum of 1-2 x/day, 45 to 90 minutes OT Frequency: Total of 15 hours over 7 days of combined therapies OT Duration/Estimated Length of Stay: 14-16 days OT Treatment/Interventions: Balance/vestibular training;Cognitive remediation/compensation;Discharge planning;Disease mangement/prevention;DME/adaptive equipment instruction;Functional mobility training;Neuromuscular re-education;Pain management;Patient/family education;Psychosocial support;Self Care/advanced ADL retraining;Therapeutic Activities;Therapeutic Exercise;UE/LE Strength taining/ROM;UE/LE Coordination  activities;Visual/perceptual remediation/compensation OT Self Feeding Anticipated Outcome(s): Min assist OT Basic Self-Care Anticipated Outcome(s): Min assist OT Toileting Anticipated Outcome(s): Min assist OT Bathroom Transfers Anticipated Outcome(s): Min-mod assist OT Recommendation Patient destination: Home Follow Up Recommendations: Home health OT;24 hour supervision/assistance Equipment Recommended: To be determined   Skilled Therapeutic Intervention OT eval completed with discussion of rehab process, OT purpose, POC, ELOS, and goals.  ADL assessment completed with bathing and dressing at sit > stand level at sink.  Pt initially asleep upon arrival, requiring increased time and stimulus to fully awaken.  +2 for safety during stand pivot transfer from bed > w/c due to initial lethargy.  Pt able to follow one step commands ~25-50% of time during bathing tasks, requiring total cues/assistance during dressing.  Frequent hand over hand assist to initiate tasks, largely due to vision impairments and due to difficulty with following commands.  Pt with c/o pain in lower back after sitting up ~30 mins.  Returned to bed max assist squat pivot and total assist to return to supine.  Pt keeping eyes closed majority of session.  Pt asleep upon positioning in supine.  OT Evaluation Precautions/Restrictions  Precautions Precautions: Fall Precaution Comments: cortical blindness Pain Pain Assessment Pain Scale: Faces Faces Pain Scale: Hurts whole lot Pain Type: Acute pain Pain Location: Head Pain Descriptors / Indicators: Constant;Headache Pain Onset: On-going Pain Intervention(s): RN made aware;Repositioned Home Living/Prior [place] expects to be discharged to:: Private residence Living Arrangements: Spouse/significant other, Children Available Help at Discharge: Family Type of Home: House Home Access: Stairs to enter Technical brewer of Steps: 4 Entrance  Stairs-Rails: Right, Left Home Layout: One level Bathroom Shower/Tub: (has tub bench and 3 in 1 (per acute chart)) Bathroom Toilet: Standard Additional Comments: All info above was taken from prior admission as pt not able to provide info this session  Lives With: Spouse Prior Function Level of Independence: Independent with basic ADLs, Independent with gait  Able to Take Stairs?: Yes Driving: No Vocation: Retired Comments: per [facility] - did not drive due to long standing visual deficits; was active around the house, walked to [employer] daily, uses no AD, Independent PTA ADL ADL Eating: Maximal assistance(hand over hand to hold cup and bring to mouth) Where Assessed-Eating: Wheelchair Grooming: Dependent Where Assessed-Grooming: Sitting at sink Upper Body Bathing: Minimal cueing, Minimal  assistance Where Assessed-Upper Body Bathing: Sitting at sink Lower Body Bathing: Maximal assistance, Moderate assistance Where Assessed-Lower Body Bathing: Sitting at sink, Standing at sink Upper Body Dressing: Dependent Where Assessed-Upper Body Dressing: Sitting at sink Lower Body Dressing: Dependent Where Assessed-Lower Body Dressing: Sitting at sink, Standing at sink Toilet Transfer: Maximal assistance Toilet Transfer Method: Squat pivot Toilet Transfer Equipment: Bedside commode Vision Baseline Vision/History: (cortical blindness) Additional Comments: tendency to keep eyes closed, often reaching out to attempt to grasp items in swinging motion.  Pt reports able to see faint shadows and shapes Cognition Overall Cognitive Status: Impaired/Different from baseline Arousal/Alertness: Lethargic Orientation Level: Person Year: (Unable to complete BIMS as not oriented to place/situation and unable to recall place after 5 mins.  Lethargy impacting participation) Memory: Impaired Memory Impairment: Storage deficit;Decreased recall of new information;Decreased short term memory Attention:  Focused;Sustained Focused Attention: Impaired Sustained Attention: Impaired Awareness: Impaired Awareness Impairment: Intellectual impairment Problem Solving: Impaired Safety/Judgment: Impaired Sensation Sensation Light Touch: Appears Intact Coordination Gross Motor Movements are Fluid and Coordinated: No Fine Motor Movements are Fluid and Coordinated: No Coordination and Movement Description: limited participation due to lethargy, difficulty following commands, and increasing c/o pain Mobility  Bed Mobility Bed Mobility: Rolling Right;Right Sidelying to Sit;Sit to Sidelying Right;Sitting - Scoot to Edge of Bed Rolling Right: Moderate Assistance - Patient 50-74% Right Sidelying to Sit: Moderate Assistance - Patient 50-74% Sitting - Scoot to Edge of Bed: Maximal Assistance - Patient 25-49% Sit to Sidelying Right: Maximal Assistance - Patient 25-49% Transfers Sit to Stand: Moderate Assistance - Patient 50-74%  Trunk/Postural Assessment     Balance   Extremity/Trunk Assessment RUE Assessment RUE Assessment: Within Functional Limits Active Range of Motion (AROM) Comments: grossly WFL, limited participation in formal assessment due to lethargy and difficulty following commands LUE Assessment LUE Assessment: Within Functional Limits Active Range of Motion (AROM) Comments: grossly WFL, limited participation in formal assessment due to lethargy and difficulty following commands     Refer to Care Plan for Long Term Goals  Recommendations for other services: None    Discharge Criteria: Patient will be discharged from OT if patient refuses treatment 3 consecutive times without medical reason, if treatment goals not met, if there is a change in medical status, if patient makes no progress towards goals or if patient is discharged from hospital.  The above assessment, treatment plan, treatment alternatives and goals were discussed and mutually agreed upon: by patient  Ellwood Dense  [facility] 11/20/2018, 8:50 AM

## 2018-11-21 ENCOUNTER — Inpatient Hospital Stay (HOSPITAL_COMMUNITY): Payer: Medicare HMO

## 2018-11-21 LAB — URINALYSIS, COMPLETE (UACMP) WITH MICROSCOPIC
Bacteria, UA: NONE SEEN
Bilirubin Urine: NEGATIVE
Glucose, UA: NEGATIVE mg/dL
Hgb urine dipstick: NEGATIVE
Ketones, ur: 5 mg/dL — AB
Nitrite: NEGATIVE
Protein, ur: NEGATIVE mg/dL
Specific Gravity, Urine: 1.025 (ref 1.005–1.030)
Squamous Epithelial / HPF: NONE SEEN (ref 0–5)
pH: 5 (ref 5.0–8.0)

## 2018-11-21 MED ORDER — DIVALPROEX SODIUM 125 MG PO CSDR
250.0000 mg | DELAYED_RELEASE_CAPSULE | Freq: Three times a day (TID) | ORAL | Status: DC
  Administered 2018-11-21 – 2018-11-23 (×5): 250 mg via ORAL
  Filled 2018-11-21 (×4): qty 2

## 2018-11-21 MED ORDER — PANTOPRAZOLE SODIUM 40 MG PO TBEC
40.0000 mg | DELAYED_RELEASE_TABLET | Freq: Every day | ORAL | Status: DC
  Administered 2018-11-21 – 2018-11-24 (×4): 40 mg via ORAL
  Filled 2018-11-21 (×3): qty 1

## 2018-11-21 NOTE — Progress Notes (Signed)
Occupational Therapy Session Note  Patient Details  Name: Walter Larsen MRN: [id number] Date of Birth: February 03, 1936  Today's Date: 11/21/2018 OT Individual Time: 1015-1055 OT Individual Time Calculation (min): 40 min    Short Term Goals: Week 1:     Skilled Therapeutic Interventions/Progress Updates:    Pt resting in enclosure bed upon arrival with RN present.  RN offering pt an Ensure and left room to prepare.  Pt oriented to person only.  Pt thought he was at home and did not know why he would be in the hospital.   Pt reoriented to place and situation.  Pt required mod A for supine>sit EOB with max multimodal cues to bring BLE off EOB.  Pt required CGA for sitting balance EOB.  Styrofoam cup placed in pt's hand in preparation to using straw for Ensure.  Pt grasped the cup too tighly and the cup crumpled.  Pt max/tot A to change shirt, pants, and socks.  Pt performed sit<>stand from EOB with min A. A new cup of Ensure was provided and therapist held cup for pt to drink about 25% of Ensure.  When questioned, pt stated he could see me but was unable to name color of shirt.  When questioned at end of session, pt stated he was at home and stated "really?" when informed that he was in hospital because he had a stroke.  Pt returned to supine in bed with Enclosure bed secured.  Call bell inside bed.   Therapy Documentation Precautions:  Precautions Precautions: Fall Precaution Comments: cortical blindness Restrictions Weight Bearing Restrictions: No  Pain: Pt c/o of upper back pain during unsupported sitting EOB; repositioned  Therapy/Group: Individual Therapy  Leroy Libman 11/21/2018, 11:01 AM

## 2018-11-21 NOTE — Plan of Care (Signed)
  Problem: RH SKIN INTEGRITY Goal: RH STG MAINTAIN SKIN INTEGRITY WITH ASSISTANCE Description: STG Maintain Skin Integrity With Assistance. Mod Outcome: Progressing   Problem: RH SAFETY Goal: RH STG ADHERE TO SAFETY PRECAUTIONS W/ASSISTANCE/DEVICE Description: STG Adhere to Safety Precautions With Assistance/Device. Max Outcome: Progressing   Problem: RH Vision Goal: RH LTG Vision Theme park manager) Outcome: Progressing   Problem: Consults Goal: RH BRAIN INJURY PATIENT EDUCATION Description: Description: See Patient Education module for eduction specifics Outcome: Progressing   Problem: RH BOWEL ELIMINATION Goal: RH STG MANAGE BOWEL WITH ASSISTANCE Description: STG Manage Bowel with Assistance. Mod Outcome: Not Progressing   Problem: RH BLADDER ELIMINATION Goal: RH STG MANAGE BLADDER WITH ASSISTANCE Description: STG Manage Bladder With Assistance. Mod Outcome: Not Progressing   Problem: RH COGNITION-NURSING Goal: RH STG USES MEMORY AIDS/STRATEGIES W/ASSIST TO PROBLEM SOLVE Description: STG Uses Memory Aids/Strategies With Assistance to Problem Solve. Mod Outcome: Not Progressing

## 2018-11-21 NOTE — Progress Notes (Signed)
[facility] PHYSICAL MEDICINE & REHABILITATION PROGRESS NOTE   Subjective/Complaints: Pt slept well. Up with NT at chair. Ate most of breakfast. Has mild headache  ROS: Limited due to cognitive/behavioral    Objective:   No results found. No results for input(s): WBC, HGB, HCT, PLT in the last 72 hours. Recent Labs    11/19/18 0724  NA 135  K 3.7  CL 103  CO2 19*  GLUCOSE 100*  BUN 9  CREATININE 0.86  CALCIUM 8.9    Intake/Output Summary (Last 24 hours) at 11/21/2018 0956 Last data filed at 11/21/2018 0700 Gross per 24 hour  Intake 320 ml  Output 938 ml  Net -618 ml     Physical Exam: Vital Signs Blood pressure (!) 153/77, pulse 79, temperature 99 F (37.2 C), temperature source Oral, resp. rate 16, SpO2 97 %. Constitutional: No distress . Vital signs reviewed. HEENT: EOMI, oral membranes moist Neck: supple Cardiovascular: RRR without murmur. No JVD    Respiratory: CTA Bilaterally without wheezes or rales. Normal effort    GI: BS +, non-tender, non-distended   Musculoskeletal:  General: No edema.  Neurological: Alert, oriented to hospital, person. follows comands. Normal language. Keeps eyes closed.  Moves all 4's.  Skin: Skin iswarmand dry.  Psychiatric: Cooperative, non-agitated    Assessment/Plan: 1. Functional deficits secondary to bilateral posterior circulation infarcts which require 3+ hours per day of interdisciplinary therapy in a comprehensive inpatient rehab setting.  Physiatrist is providing close team supervision and 24 hour management of active medical problems listed below.  Physiatrist and rehab team continue to assess barriers to discharge/monitor patient progress toward functional and medical goals  Care Tool:  Bathing    Body parts bathed by patient: Right arm, Left arm, Chest, Abdomen, Right upper leg, Left upper leg, Face   Body parts bathed by helper: Front perineal area, Buttocks, Right lower leg, Left lower leg      Bathing assist Assist Level: Moderate Assistance - Patient 50 - 74%     Upper Body Dressing/Undressing Upper body dressing   What is the patient wearing?: Pull over shirt    Upper body assist Assist Level: Total Assistance - Patient < 25%    Lower Body Dressing/Undressing Lower body dressing      What is the patient wearing?: Pants     Lower body assist Assist for lower body dressing: Total Assistance - Patient < 25%     Toileting Toileting    Toileting assist Assist for toileting: Dependent - Patient 0%     Transfers Chair/bed transfer  Transfers assist     Chair/bed transfer assist level: Maximal Assistance - Patient 25 - 49%     Locomotion Ambulation   Ambulation assist   Ambulation activity did not occur: Safety/medical concerns          Walk 10 feet activity   Assist  Walk 10 feet activity did not occur: Safety/medical concerns        Walk 50 feet activity   Assist Walk 50 feet with 2 turns activity did not occur: Safety/medical concerns         Walk 150 feet activity   Assist Walk 150 feet activity did not occur: Safety/medical concerns         Walk 10 feet on uneven surface  activity   Assist Walk 10 feet on uneven surfaces activity did not occur: Safety/medical concerns         Wheelchair     Assist Will patient use  wheelchair at discharge?: No   Wheelchair activity did not occur: N/A         Wheelchair 50 feet with 2 turns activity    Assist    Wheelchair 50 feet with 2 turns activity did not occur: N/A       Wheelchair 150 feet activity     Assist Wheelchair 150 feet activity did not occur: N/A        Medical Problem List and Plan: 1.Functional deficitssecondary to [place] with cortical blindness and confusion --Patient is beginning CIR therapies today including PT, OT, and SLP  -enclosure bed for safety still required 2.  Antithrombotics: -DVT/anticoagulation:Mechanical:Sequential compression devices, below kneeBilateral lower extremities -antiplatelet therapy: N/A 3. Pain Management:N/A 4. Mood:LCSW to follow for evaluation and support when appropriate -antipsychotic agents: continue scheduled seroquel at HS -maintain sleep chart, slept well again last night   -looked the best i've seen him yet this morning 5. Neuropsych: This patientis notcapable of making decisions on hisown behalf. 6. Skin/Wound Care:Routine pressure relief measures 7. Fluids/Electrolytes/Nutrition:Monitor I's and O's. Offer supplements between meals 8.ZOX:WRUEAVW 3 times daily. Continue amlodipine and Lopressor. Titrate medications for better control 9.Agitation: Continue Depakote every 8 hours. Check level on Monday  -seroquel as above 10.GERD: On Protonix 11.Leukocytosis: Monitor for signs of infection. 12. Urinary retention: Continue Flomax.   -awaiting ua, ucx  - Monitor voiding with bladder scan and cath for volumes > 350 cc   `-still retaining 300+cc  LOS: 2 days A FACE TO FACE EVALUATION WAS PERFORMED  Meredith Staggers 11/21/2018, 9:56 AM

## 2018-11-22 ENCOUNTER — Inpatient Hospital Stay (HOSPITAL_COMMUNITY): Payer: Medicare HMO | Admitting: Physical Therapy

## 2018-11-22 ENCOUNTER — Inpatient Hospital Stay (HOSPITAL_COMMUNITY): Payer: Medicare HMO | Admitting: Speech Pathology

## 2018-11-22 ENCOUNTER — Inpatient Hospital Stay (HOSPITAL_COMMUNITY): Payer: Medicare HMO | Admitting: Occupational Therapy

## 2018-11-22 LAB — COMPREHENSIVE METABOLIC PANEL
ALT: 42 U/L (ref 0–44)
AST: 39 U/L (ref 15–41)
Albumin: 3 g/dL — ABNORMAL LOW (ref 3.5–5.0)
Alkaline Phosphatase: 46 U/L (ref 38–126)
Anion gap: 13 (ref 5–15)
BUN: 11 mg/dL (ref 8–23)
CO2: 24 mmol/L (ref 22–32)
Calcium: 9 mg/dL (ref 8.9–10.3)
Chloride: 99 mmol/L (ref 98–111)
Creatinine, Ser: 0.8 mg/dL (ref 0.61–1.24)
GFR calc Af Amer: >60 mL/min (ref >60)
GFR calc non Af Amer: >60 mL/min (ref >60)
Glucose, Bld: 115 mg/dL — ABNORMAL HIGH (ref 70–99)
Potassium: 3.2 mmol/L — ABNORMAL LOW (ref 3.5–5.1)
Sodium: 136 mmol/L (ref 135–145)
Total Bilirubin: 1.2 mg/dL (ref 0.3–1.2)
Total Protein: 7 g/dL (ref 6.5–8.1)

## 2018-11-22 LAB — CBC WITH DIFFERENTIAL/PLATELET
Abs Immature Granulocytes: 0.1 10*3/uL — ABNORMAL HIGH (ref 0.00–0.07)
Basophils Absolute: 0.1 10*3/uL (ref 0.0–0.1)
Basophils Relative: 0 %
Eosinophils Absolute: 0.1 10*3/uL (ref 0.0–0.5)
Eosinophils Relative: 1 %
HCT: 46.2 % (ref 39.0–52.0)
Hemoglobin: 15.3 g/dL (ref 13.0–17.0)
Immature Granulocytes: 1 %
Lymphocytes Relative: 8 %
Lymphs Abs: 1.2 10*3/uL (ref 0.7–4.0)
MCH: 31.7 pg (ref 26.0–34.0)
MCHC: 33.1 g/dL (ref 30.0–36.0)
MCV: 95.9 fL (ref 80.0–100.0)
Monocytes Absolute: 2.2 10*3/uL — ABNORMAL HIGH (ref 0.1–1.0)
Monocytes Relative: 15 %
Neutro Abs: 10.9 10*3/uL — ABNORMAL HIGH (ref 1.7–7.7)
Neutrophils Relative %: 75 %
Platelets: 259 10*3/uL (ref 150–400)
RBC: 4.82 MIL/uL (ref 4.22–5.81)
RDW: 12.9 % (ref 11.5–15.5)
WBC: 14.6 10*3/uL — ABNORMAL HIGH (ref 4.0–10.5)
nRBC: 0 % (ref 0.0–0.2)

## 2018-11-22 LAB — URINE CULTURE: Culture: <10000 — AB

## 2018-11-22 LAB — VALPROIC ACID LEVEL: Valproic Acid Lvl: 42 ug/mL — ABNORMAL LOW (ref 50.0–100.0)

## 2018-11-22 MED ORDER — POTASSIUM CHLORIDE CRYS ER 20 MEQ PO TBCR
20.0000 meq | EXTENDED_RELEASE_TABLET | Freq: Every day | ORAL | Status: DC
  Administered 2018-11-22 – 2018-11-26 (×4): 20 meq via ORAL
  Filled 2018-11-22 (×5): qty 1

## 2018-11-22 NOTE — Progress Notes (Signed)
Inpatient Rehabilitation  Patient information reviewed and entered into eRehab system by Hadlei Stitt M. Goro Wenrick, M.A., CCC/SLP, PPS Coordinator.  Information including medical coding, functional ability and quality indicators will be reviewed and updated through discharge.    

## 2018-11-22 NOTE — Progress Notes (Signed)
Occupational Therapy Session Note  Patient Details  Name: REGINALD WEIDA MRN: [id number] Date of Birth: 1935-08-14  Today's Date: 11/22/2018 OT Individual Time: 1200-1240 OT Individual Time Calculation (min): 40 min (make-up time)   Skilled Therapeutic Interventions/Progress Updates:    Pt seen for OT make-up time.  Pt received sitting up in w/c with hand off from OT, pt agreeable to tx session. Pt not oriented to place or situation, re-oriented with little awareness of information and unable to recall orientation facts throughout session.  Pt taken to therapy day room to eat lunch. He required total A hand over hand to initiate bringing cup to mouth and orienting straw, however, once task initiated, pt able to cont with min A to hold cup.  Attempted same method with utensil with food, however, pt refusing to eat and keeping mouth shut. With total A, he was able to eat several bites of Magic Cup with encouragement. Pt then refusing anymore. Pt taken back to room and returned to bed RN request as needed to get cathed. Mod A with multi-modal cuing to initiate stand and max-total A multi-modal cuing to initiate turning and sitting. Pt required max A to initiate movement and return to supine.  Pt left in supine with RN present to provide care.   Pt perseverative on blowing nose throughout session. Total A to place kleenex in hand and facilitate use.   Therapy Documentation Precautions:  Precautions Precautions: Fall Precaution Comments: cortical blindness Restrictions Weight Bearing Restrictions: No   Therapy/Group: Individual Therapy  Osvaldo Lamping L 11/22/2018, 11:28 AM

## 2018-11-22 NOTE — Progress Notes (Addendum)
Occupational Therapy Session Note  Patient Details  Name: Walter Larsen MRN: [id number] Date of Birth: [birth year]  Today's Date: 11/22/2018 OT Individual Time: 1040-1150 OT Individual Time Calculation (min): 70 min   Short Term Goals: Week 1:     Skilled Therapeutic Interventions/Progress Updates:    Pt greeted semi-reclined in enclosure bed, stating " I need to get out of here, I need to run a line from here to there." Pt disoriented to place, time, and situation. OT reoriented pt, but continued to be confused. Pt perseverative on "running a line to the house." OT handed pt wash cloth and cued him to wash peri-area. Pt needed hand over hand A to initiate task. Pt followed commands to roll with min A for OT to change brief. OT used backwards chaining for LB dressing, pt was able to follow 1 step commands to lift R LE,  Then LLE, for OT to thread pant legs. OT then provided multimodal cues to see if he could initiate hip bridge to pull pants up over hips. Pt was able to pull pants up some, but needed OT assist and rolling to finish. OT provided pt with wc cushion. Pt needed max/total  A to come to sitting EOB 2/2 poor initiation. Once EOB, min A for balance. Pt able to power up into standing with mod A, but needed facilitation and multimodal cues for pivot. UB bathing/dressing completed from wc level at the sink. Pt with improved initiation within functional bathing task and was able to follow commands to wash under arms. Pt with ideational apraxia and tried to eat deodorant requiring hand over hand A to place deodorant in correct spot. OT utilized backwards chaining by placing shirt overhead first, then pt needed max multimodal cues and total A to finish shirt. Pt trying to take shirt off multiple times after putting it on. Pt handoff to OT for next treatment session.   Therapy Documentation Precautions:  Precautions Precautions: Fall Precaution Comments: cortical blindness Restrictions Weight  Bearing Restrictions: No Pain: Pain Assessment Pain Scale: 0-10 Pain Score: 0-No pain Faces Pain Scale: No hurt   Therapy/Group: Individual Therapy  Valma Cava 11/22/2018, 10:50 AM

## 2018-11-22 NOTE — Progress Notes (Signed)
Speech Language Pathology Daily Session Note  Patient Details  Name: Walter Larsen MRN: [id number] Date of Birth: [birth year]  Today's Date: 11/22/2018 SLP Individual Time: 0815-0900 SLP Individual Time Calculation (min): 45 min and Today's Date: 11/22/2018 SLP Missed Time: 15 Minutes Missed Time Reason: Patient fatigue  Short Term Goals: Week 1: SLP Short Term Goal 1 (Week 1): Pt will sustain attention to basic familiar task for ~ 1 minute with Max A multimodal cues. SLP Short Term Goal 2 (Week 1): Pt will follow 1 step basic directions in 2 out of 10 opportunities with Max A multimodal cues. SLP Short Term Goal 3 (Week 1): Pt will consume current diet with minimal overt s/s of aspiration and Mod A cues for use of compensatory swallow strategies.  Skilled Therapeutic Interventions: Skilled treatment session focused on cognitive and dysphagia goals. Upon arrival, patient was upright in bed but appeared lethargic. SLP facilitated session by providing total A for self-feeding despite hand over hand assist. Max verbal and tactile cues were also required for patient to form a lip seal around the spoon and straw in order to receive the bolus. Patient consumed minimal amount of Dys. 1 textures with thin liquids with subtle, intermittent throat clearing. Suspect due to decreased awareness of bolus. Recommend patient continue current diet. SLP also facilitated session by providing total A hand over hand to assist to perform basic self-care tasks like washing his face and brushing his teeth via the suction toothbrush. Patient only oriented to person and required total A for orientation to place and situation. Patient lethargic and falling asleep in bed requiring Max verbal cues for arousal. Therefore, patient missed remaining 15 mins of session. Patient left upright in enclosure bed with all needs within reach. Continue with current plan of care.      Pain Pain Assessment Pain Scale: 0-10 Pain Score: 0-No  pain Faces Pain Scale: No hurt  Therapy/Group: Individual Therapy  Harshita Bernales 11/22/2018, 12:16 PM

## 2018-11-22 NOTE — IPOC Note (Signed)
Overall Plan of Care [facility]) Patient Details Name: Walter Larsen MRN: [id number] DOB: 01-Aug-1935  Admitting Diagnosis: ICH (intracerebral hemorrhage) [facility])  Hospital Problems: Principal Problem:   ICH (intracerebral hemorrhage) ([place])     Functional Problem List: Nursing Behavior, Bladder, Bowel, Medication Management, Motor, Nutrition, Perception, Safety  PT Balance, Behavior, Endurance, Motor, Perception, Safety  OT Balance, Behavior, Cognition, Endurance, Motor, Pain, Perception, Safety, Vision  SLP Cognition, Nutrition, Perception, Safety  TR         Basic ADL's: OT Eating, Grooming, Bathing, Dressing     Advanced  ADL's: OT       Transfers: PT Bed Mobility, Bed to Chair, Car, Furniture, Floor  OT Toilet, Tub/Shower     Locomotion: PT Ambulation, Stairs(anticipate that he will not be functionally able to use w/c 2/2 cognitive deficits)     Additional Impairments: OT Fuctional Use of Upper Extremity  SLP Swallowing, Social Cognition   Attention, Problem Solving  TR      Anticipated Outcomes Item Anticipated Outcome  Self Feeding Min assist  Swallowing  Mod A   Basic self-care  Min assist  Toileting  Min assist   Bathroom Transfers Min-mod assist  Bowel/Bladder  regain continence, mantain regular pattern of emptying bowel/bladder  Transfers  min assist  Locomotion  min assist household gait w/ LRAD  Communication     Cognition  Max A for basic  Pain  remain pain free  Safety/Judgment  remain free of falls, skin breakdown and infection   Therapy Plan: PT Intensity: Minimum of 1-2 x/day ,45 to 90 minutes PT Frequency: 5 out of 7 days PT Duration Estimated Length of Stay: 14-16 days OT Intensity: Minimum of 1-2 x/day, 45 to 90 minutes OT Frequency: Total of 15 hours over 7 days of combined therapies OT Duration/Estimated Length of Stay: 14-16 days SLP Intensity: Minumum of 1-2 x/day, 30 to 90 minutes SLP Frequency: 3 to 5 out of 7 days SLP  Duration/Estimated Length of Stay: 18 to 21 days   Due to the current state of emergency, patients may not be receiving their 3-hours of Medicare-mandated therapy.   Team Interventions: Nursing Interventions Patient/Family Education, Bladder Management, Bowel Management, Disease Management/Prevention, Medication Management, Discharge Planning, Psychosocial Support, Cognitive Remediation/Compensation, Dysphagia/Aspiration Precaution Training  PT interventions Ambulation/gait training, Discharge planning, Cognitive remediation/compensation, DME/adaptive equipment instruction, Functional mobility training, Pain management, Psychosocial support, Splinting/orthotics, Therapeutic Activities, UE/LE Strength taining/ROM, Visual/perceptual remediation/compensation, UE/LE Coordination activities, Skin care/wound management, Stair training, Therapeutic Exercise, Patient/family education, Neuromuscular re-education, Functional electrical stimulation, Disease management/prevention, Academic librarian, Training and development officer  OT Interventions Training and development officer, Cognitive remediation/compensation, Discharge planning, Disease mangement/prevention, DME/adaptive equipment instruction, Functional mobility training, Neuromuscular re-education, Pain management, Patient/family education, Psychosocial support, Self Care/advanced ADL retraining, Therapeutic Activities, Therapeutic Exercise, UE/LE Strength taining/ROM, UE/LE Coordination activities, Visual/perceptual remediation/compensation  SLP Interventions Cognitive remediation/compensation, Environmental controls, Patient/family education, Internal/external aids, Dysphagia/aspiration precaution training  TR Interventions    SW/CM Interventions Discharge Planning, Psychosocial Support, Patient/Family Education   Barriers to Discharge MD  Medical stability  Nursing      PT Other (comments), Behavior new cortical blindness  OT Other (comments) vision   SLP      SW       Team Discharge Planning: Destination: PT-Home ,OT- Home , SLP-(TBD) Projected Follow-up: PT-Home health PT, OT-  Home health OT, 24 hour supervision/assistance, SLP-(TBD) Projected Equipment Needs: PT-To be determined, OT- To be determined, SLP-  Equipment Details: PT- , OT-  Patient/family involved in discharge planning: PT- Patient unable/family or  caregiver not available,  OT-Patient, SLP-Patient unable/family or caregive not available  MD ELOS: 14-17 days Medical Rehab Prognosis:  Excellent Assessment: The patient has been admitted for CIR therapies with the diagnosis of bilateral cerebral infarcts. The team will be addressing functional mobility, strength, stamina, balance, safety, adaptive techniques and equipment, self-care, bowel and bladder mgt, patient and caregiver education, visual-spatial awareness, behavior, sleep-wake, cognition, swallowing, . Goals have been set at min to occasional mod assist with self-care and mobility and mod assist for cognition.   Due to the current state of emergency, patients may not be receiving their 3 hours per day of Medicare-mandated therapy.    Meredith Staggers, MD, FAAPMR      See Team Conference Notes for weekly updates to the plan of care

## 2018-11-22 NOTE — Progress Notes (Signed)
Social Work Assessment and Plan   Patient Details  Name: Walter Larsen MRN: [id number] Date of Birth: 06-22-1935  Today's Date: 11/22/2018  Problem List:  Patient Active Problem List   Diagnosis Date Noted  . Confusion 11/19/2018  . Dysphagia 11/19/2018  . 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 BPH (benign prostatic hyperplasia) 11/18/2018  . 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 Coronary artery disease involving native coronary artery of native heart without angina pectoris 01/10/2015  . Dyslipidemia 01/10/2015  . Cognitive deficits, late effect of cerebrovascular disease 11/07/2013  . Left homonymous hemianopsia 05/10/2013  . Presence of IVC filter 05/10/2013  . ICH (intracerebral hemorrhage) ([place]) 03/15/2013  . Dry eyes 03/08/2013  . Hyponatremia 03/08/2013  . Headache 03/08/2013  . Acute encephalopathy 03/08/2013  . Pulmonary emboli ([place]) 03/08/2013  . Other and unspecified hyperlipidemia 03/02/2013  . Bradycardia 03/02/2013  . Hypokalemia 03/02/2013  . Hemorrhagic stroke ([place]) 03/01/2013  . Essential hypertension, benign 03/01/2013  . Fever, unspecified 03/01/2013   Past Medical History:  Past Medical History:  Diagnosis Date  . Coronary artery disease    3 stents  . ICH (intracerebral hemorrhage) ([place]) 2014   Past Surgical History:  Past Surgical History:  Procedure Laterality Date  . TEE WITHOUT CARDIOVERSION N/A 03/07/2013   Procedure: TRANSESOPHAGEAL ECHOCARDIOGRAM (TEE);  Surgeon: Fay Records, MD;  Location: [facility] ENDOSCOPY;  Service: Cardiovascular;  Laterality: N/A;   Social History:  reports that he has never smoked. He has never used smokeless tobacco. He reports that he does not drink alcohol or use drugs.  Family / Support Systems Marital Status: Married Patient Roles: Spouse, Parent Spouse/Significant Other: wife, Obi Scrima @ [phone] or Theresa Duty Children: son, Granite Godman (local to pt) @ [phone];  daughter living in [place] Other  Supports: grandaughter also local, however, recently has a baby. Anticipated Caregiver: wife and son - considering private duty assist as well. Ability/Limitations of Caregiver: Wifer reports that she uses a rolling walker for all mobility.  She can complete her own ADLs/ IADLs, however, not able to provide assistance to pt other than supervision. Son works days. Caregiver Availability: 24/7 Family Dynamics: Wife and son supportive and concerned, however, may need to hire private duty caregivers to meet pt's care needs.  Social History Preferred language: English Religion: Christian Cultural Background: NA Read: Yes Write: Yes Employment Status: Retired Public relations account executive Issues: None Guardian/Conservator: None - per MD, pt is not capable of makin decision on his own behalf - defer to spouse.   Abuse/Neglect Abuse/Neglect Assessment Can Be Completed: Unable to assess, patient is non-responsive or altered mental status Physical Abuse: Denies Verbal Abuse: Denies Sexual Abuse: Denies Exploitation of patient/patient's resources: Denies Self-Neglect: Denies  Emotional Status Pt's affect, behavior and adjustment status: Pt sitting up in w/c, however, does not engage with this SW at all.  Therapies trying to elicit some response/ reaction without success.  All assessment information provided by wife and son. Recent Psychosocial Issues: Pt with h/o ICH ~ 6 yrs ago but was independent with mobility and ADLs (no driving) Psychiatric History: None Substance Abuse History: None  Patient / Family Perceptions, Expectations & Goals Pt/Family understanding of illness & functional limitations: Unable to assess if pt aware of any part of the current circumstances.  Wife and son with good understanding of the medical issues and are familiar with CIR (here after [place] in 2014).  Will need education on new, anticipated care needs for pt to help them with a clear  picture of the assistance they will  need to have in place at home. Premorbid pt/family roles/activities: Pt was independent overall, however, no driving. Anticipated changes in roles/activities/participation: Per goals, pt will need 24/7 hands-on assistance - caregiver plan still TBD Pt/family expectations/goals: We just hope he can get better."  (wife)  [employer]: None Premorbid Home Care/DME Agencies: Other (Comment)(has had HH/ OP therapy after ICH) Transportation available at discharge: yes Resource referrals recommended: Neuropsychology, Support group (specify)  Discharge Planning Living Arrangements: Spouse/significant other Support Systems: Spouse/significant other, Children Type of Residence: Private residence Insurance Resources: Medicare(Aetna Medicare) Financial Resources: Social Security Financial Screen Referred: No Living Expenses: Own Money Management: Family Does the patient have any problems obtaining your medications?: No Home Management: mostly pt PTA Patient/Family Preliminary Plans: Wife and son report their hope/ plan is for pt to return home.  Understand he is expected to need 24/7 physical assistance, however, they do not yet have an appropriate caregiver in place. Sw Barriers to Discharge: Decreased caregiver support Social Work Anticipated Follow Up Needs: HH/OP Expected length of stay: 14-16 days  Clinical Impression Elderly gentleman here following bilateral cerebral hemorrhage and with h/o ICH (here in 2014).  With severe physical and cognitive deficits as well as new cortical blindness.  Virtually non-responsive, therefore, wife and son providing assessment information.  Family is clear that he will require 24/7 physical care, however, plan not yet secured.  Hopeful that alertness will improve over the next few days to allow more interaction with pt.  If not, concern, that care may be too much for family in the home.  Will follow for d/c planning and support  needs.  Bayan Kushnir 11/22/2018, 2:56 PM

## 2018-11-22 NOTE — Progress Notes (Signed)
[place] PHYSICAL MEDICINE & REHABILITATION PROGRESS NOTE   Subjective/Complaints: Up in bed. Eating breakfast with assistance of NT. Denies headache  ROS: Limited due to cognitive/behavioral    Objective:   No results found. Recent Labs    11/22/18 0723  WBC 14.6*  HGB 15.3  HCT 46.2  PLT 259   Recent Labs    11/22/18 0723  NA 136  K 3.2*  CL 99  CO2 24  GLUCOSE 115*  BUN 11  CREATININE 0.80  CALCIUM 9.0    Intake/Output Summary (Last 24 hours) at 11/22/2018 1017 Last data filed at 11/22/2018 0816 Gross per 24 hour  Intake 940 ml  Output 1250 ml  Net -310 ml     Physical Exam: Vital Signs Blood pressure (!) 144/74, pulse 75, temperature 98.4 F (36.9 C), temperature source Oral, resp. rate 20, SpO2 96 %. Constitutional: No distress . Vital signs reviewed. HEENT: EOMI, oral membranes moist Neck: supple Cardiovascular: RRR without murmur. No JVD    Respiratory: CTA Bilaterally without wheezes or rales. Normal effort    GI: BS +, non-tender, non-distended    Musculoskeletal:  General: No edema.  Neurological: Alert, oriented to hospital, person only. follows comands sporadically. Unable to ID any objects, colors in front of him. ?light/dark. Normal language. Keeps eyes closed.  Moves all 4's.  Skin: Skin iswarmand dry.  Psychiatric: flat    Assessment/Plan: 1. Functional deficits secondary to bilateral posterior circulation infarcts which require 3+ hours per day of interdisciplinary therapy in a comprehensive inpatient rehab setting.  Physiatrist is providing close team supervision and 24 hour management of active medical problems listed below.  Physiatrist and rehab team continue to assess barriers to discharge/monitor patient progress toward functional and medical goals  Care Tool:  Bathing    Body parts bathed by patient: Right arm, Left arm, Chest, Abdomen, Right upper leg, Left upper leg, Face   Body parts bathed by helper:  Front perineal area, Buttocks, Right lower leg, Left lower leg     Bathing assist Assist Level: Moderate Assistance - Patient 50 - 74%     Upper Body Dressing/Undressing Upper body dressing   What is the patient wearing?: Pull over shirt    Upper body assist Assist Level: Total Assistance - Patient < 25%    Lower Body Dressing/Undressing Lower body dressing      What is the patient wearing?: Pants     Lower body assist Assist for lower body dressing: Total Assistance - Patient < 25%     Toileting Toileting    Toileting assist Assist for toileting: Total Assistance - Patient < 25%     Transfers Chair/bed transfer  Transfers assist     Chair/bed transfer assist level: Moderate Assistance - Patient 50 - 74%     Locomotion Ambulation   Ambulation assist   Ambulation activity did not occur: Safety/medical concerns          Walk 10 feet activity   Assist  Walk 10 feet activity did not occur: Safety/medical concerns        Walk 50 feet activity   Assist Walk 50 feet with 2 turns activity did not occur: Safety/medical concerns         Walk 150 feet activity   Assist Walk 150 feet activity did not occur: Safety/medical concerns         Walk 10 feet on uneven surface  activity   Assist Walk 10 feet on uneven surfaces activity did not occur:  Safety/medical concerns         Wheelchair     Assist Will patient use wheelchair at discharge?: No   Wheelchair activity did not occur: N/A         Wheelchair 50 feet with 2 turns activity    Assist    Wheelchair 50 feet with 2 turns activity did not occur: N/A       Wheelchair 150 feet activity     Assist Wheelchair 150 feet activity did not occur: N/A        Medical Problem List and Plan: 1.Functional deficitssecondary to [place] with cortical blindness and confusion --Patient is beginning CIR therapies today including PT, OT, and SLP   -enclosure bed for safety still required---may move to regular bed soon.  2. Antithrombotics: -DVT/anticoagulation:Mechanical:Sequential compression devices, below kneeBilateral lower extremities -antiplatelet therapy: N/A 3. Pain Management:N/A 4. Mood:LCSW to follow for evaluation and support when appropriate -antipsychotic agents: continue scheduled seroquel at HS -maintain sleep chart, sleep cycle improving 5. Neuropsych: This patientis notcapable of making decisions on hisown behalf. 6. Skin/Wound Care:Routine pressure relief measures 7. Fluids/Electrolytes/Nutrition: continue to push po, supervision with meals  -supplements between meals  -replete K+ (3.2) 8.HTN:  Continue amlodipine and Lopressor.  Fair control at present 9.Agitation: Continue Depakote every 8 hours.  -level a little low (42) but agitation improved. Will not increase dose   -seroquel as above 10.GERD: On Protonix 11.Leukocytosis: Monitor for signs of infection. 12. Urinary retention: Continue Flomax.   -UA negative, UCX pending  - Monitor voiding with bladder scan and cath for volumes > 350 cc   -still retaining 300+cc  -OOB to void  LOS: 3 days A FACE TO [place] 11/22/2018, 10:17 AM

## 2018-11-22 NOTE — Progress Notes (Signed)
Physical Therapy Session Note  Patient Details  Name: Walter Larsen MRN: [id number] Date of Birth: 1936/02/14  Today's Date: 11/22/2018 PT Individual Time: 1300-1340 AND 1410-1420 PT Individual Time Calculation (min): 40 min AND 10 min   Short Term Goals: Week 1:  PT Short Term Goal 1 (Week 1): Pt will transfer bed<>chair w/ mod assist PT Short Term Goal 2 (Week 1): Pt will initiate gait training PT Short Term Goal 3 (Week 1): Pt will perform bed mobility w/ mod assist PT Short Term Goal 4 (Week 1): Pt will initiate functional tasks w/ mod cues, 75% of the time  Skilled Therapeutic Interventions/Progress Updates:   Session 1:  Pt in supine upon arrival, alert but did not open eyes. Agreeable to get OOB for therapy. Dependent supine>sit 2/2 cognition and pt unable to sequencing getting to edge of bed. Donned pants total assist while seated and attempted sit<>stands to pull pants up over hips. Pt w/ increased agitation while therapist was cueing to initiate sit<>stand, stated "I'm good, it's good, leave it alone". Max verbal and tactile cues to orient to performing functional task. Pt eventually agreeable to stand, however would immediately straighten legs in seated position when therapist would facilitate standing. Pt eventually able to reach squat position while therapist pulled pants over hips. Max verbal, tactile, and manual cues for foot placement, forward trunk lean, and to push up on LEs to stand. Ultimately performed dependent squat pivot transfer to w/c, pt continued to have no awareness of body position, functional task he was performing, or environment. Total assist w/c transport to/from therapy gym. Attempted to work on sit<>stands in quiet gym, however pt no longer verbally or physically responding to this therapist. Pt w/ eyes closed and head down, unresponsive to tactile or noxious stimuli, and unresponsive to sternal rub. Pt breathing and vitals WNL for him, BP 142/62 and HR 86. No  signs or symptoms of distress. Pt in w/c, therapist deemed unsafe to attempt transfer back to bed. Ended session at nurses station and in care of NT, all needs met.   Session 2:  Returned to see pt for make up time. Pt continued to be lethargic and only minimally responsive verbally. Pt confirmed he would like to return to bed. Dependent squat pivot transfer to EOB w/ max verbal/tactile/manual cues for technique, sequencing, and hand placement. Dependent sit>supine as well. Attempted to have pt push himself up on bed, total assist for BLE placement to facilitate supine bridge, however pt unable. Dependently repositioned in bed. Pt left resting in supine, all needs in reach.   Missed 25 min of skilled PT overall today 2/2 fatigue/lethargy. Pt's frequency to be downgraded to 15/7 2/2 poor activity tolerance and participation in general.   Therapy Documentation Precautions:  Precautions Precautions: Fall Precaution Comments: cortical blindness Restrictions Weight Bearing Restrictions: No Vital Signs: Therapy Vitals Temp: 99.4 F (37.4 C) Temp Source: Oral Pulse Rate: 91 Resp: 18 BP: (!) 142/62 Patient Position (if appropriate): Sitting Oxygen Therapy SpO2: 95 % O2 Device: Room Air  Therapy/Group: Individual Therapy  Walter Larsen Clent Demark 11/22/2018, 3:35 PM

## 2018-11-22 NOTE — Plan of Care (Signed)
  Problem: RH BOWEL ELIMINATION Goal: RH STG MANAGE BOWEL WITH ASSISTANCE Description: STG Manage Bowel with Assistance. Mod Outcome: Progressing   Problem: RH BLADDER ELIMINATION Goal: RH STG MANAGE BLADDER WITH ASSISTANCE Description: STG Manage Bladder With Assistance. Mod Outcome: Progressing   Problem: RH SKIN INTEGRITY Goal: RH STG MAINTAIN SKIN INTEGRITY WITH ASSISTANCE Description: STG Maintain Skin Integrity With Assistance. Mod Outcome: Progressing   Problem: RH SAFETY Goal: RH STG ADHERE TO SAFETY PRECAUTIONS W/ASSISTANCE/DEVICE Description: STG Adhere to Safety Precautions With Assistance/Device. Max Outcome: Progressing   Problem: RH COGNITION-NURSING Goal: RH STG USES MEMORY AIDS/STRATEGIES W/ASSIST TO PROBLEM SOLVE Description: STG Uses Memory Aids/Strategies With Assistance to Problem Solve. Mod Outcome: Progressing   Problem: RH Vision Goal: RH LTG Vision (Specify) Outcome: Progressing   Problem: Consults Goal: RH BRAIN INJURY PATIENT EDUCATION Description: Description: See Patient Education module for eduction specifics Outcome: Progressing

## 2018-11-23 ENCOUNTER — Inpatient Hospital Stay (HOSPITAL_COMMUNITY): Payer: Medicare HMO | Admitting: Occupational Therapy

## 2018-11-23 ENCOUNTER — Inpatient Hospital Stay (HOSPITAL_COMMUNITY): Payer: Medicare HMO | Admitting: Physical Therapy

## 2018-11-23 ENCOUNTER — Inpatient Hospital Stay (HOSPITAL_COMMUNITY): Payer: Medicare HMO

## 2018-11-23 ENCOUNTER — Inpatient Hospital Stay (HOSPITAL_COMMUNITY): Payer: Medicare HMO | Admitting: Speech Pathology

## 2018-11-23 DIAGNOSIS — D72823 Leukemoid reaction: Secondary | ICD-10-CM

## 2018-11-23 DIAGNOSIS — R5383 Other fatigue: Secondary | ICD-10-CM

## 2018-11-23 LAB — URINALYSIS, COMPLETE (UACMP) WITH MICROSCOPIC
Bilirubin Urine: NEGATIVE
Glucose, UA: NEGATIVE mg/dL
Hgb urine dipstick: NEGATIVE
Ketones, ur: NEGATIVE mg/dL
Leukocytes,Ua: NEGATIVE
Nitrite: NEGATIVE
Protein, ur: NEGATIVE mg/dL
Specific Gravity, Urine: 1.023 (ref 1.005–1.030)
pH: 6 (ref 5.0–8.0)

## 2018-11-23 LAB — CBC
HCT: 47.1 % (ref 39.0–52.0)
Hemoglobin: 16 g/dL (ref 13.0–17.0)
MCH: 32.1 pg (ref 26.0–34.0)
MCHC: 34 g/dL (ref 30.0–36.0)
MCV: 94.4 fL (ref 80.0–100.0)
Platelets: 264 10*3/uL (ref 150–400)
RBC: 4.99 MIL/uL (ref 4.22–5.81)
RDW: 12.9 % (ref 11.5–15.5)
WBC: 18.3 10*3/uL — ABNORMAL HIGH (ref 4.0–10.5)
nRBC: 0 % (ref 0.0–0.2)

## 2018-11-23 LAB — BASIC METABOLIC PANEL
Anion gap: 13 (ref 5–15)
BUN: 14 mg/dL (ref 8–23)
CO2: 22 mmol/L (ref 22–32)
Calcium: 9.8 mg/dL (ref 8.9–10.3)
Chloride: 102 mmol/L (ref 98–111)
Creatinine, Ser: 0.9 mg/dL (ref 0.61–1.24)
GFR calc Af Amer: >60 mL/min (ref >60)
GFR calc non Af Amer: >60 mL/min (ref >60)
Glucose, Bld: 138 mg/dL — ABNORMAL HIGH (ref 70–99)
Potassium: 4.1 mmol/L (ref 3.5–5.1)
Sodium: 137 mmol/L (ref 135–145)

## 2018-11-23 MED ORDER — SODIUM CHLORIDE 0.45 % IV SOLN
INTRAVENOUS | Status: DC
  Administered 2018-11-23 – 2018-11-29 (×11): via INTRAVENOUS

## 2018-11-23 MED ORDER — QUETIAPINE FUMARATE 25 MG PO TABS
25.0000 mg | ORAL_TABLET | Freq: Every day | ORAL | Status: DC
  Administered 2018-11-23 – 2018-11-24 (×2): 25 mg via ORAL
  Filled 2018-11-23 (×2): qty 1

## 2018-11-23 MED ORDER — SODIUM CHLORIDE 0.9 % IV SOLN
1.0000 g | INTRAVENOUS | Status: DC
  Administered 2018-11-23 – 2018-11-25 (×3): 1 g via INTRAVENOUS
  Filled 2018-11-23: qty 1
  Filled 2018-11-23 (×3): qty 10

## 2018-11-23 NOTE — Progress Notes (Signed)
Speech Language Pathology Daily Session Note  Patient Details  Name: JACOBB ALEN MRN: [id number] Date of Birth: 1935/11/29  Today's Date: 11/23/2018 SLP Individual Time: 0086-7619 SLP Individual Time Calculation (min): 20 min and Today's Date: 11/23/2018 SLP Missed Time: 10 Minutes Missed Time Reason: Patient fatigue  Short Term Goals: Week 1: SLP Short Term Goal 1 (Week 1): Pt will sustain attention to basic familiar task for ~ 1 minute with Max A multimodal cues. SLP Short Term Goal 2 (Week 1): Pt will follow 1 step basic directions in 2 out of 10 opportunities with Max A multimodal cues. SLP Short Term Goal 3 (Week 1): Pt will consume current diet with minimal overt s/s of aspiration and Mod A cues for use of compensatory swallow strategies.   Skilled Therapeutic Interventions: Skilled treatment session focused on cognitive goals. Upon arrival, patient asleep in a tilt-in-space wheelchair. Despite Max verbal and tactile cues patient unable to arouse. SLP facilitated session by providing total A for oral care via the suction toothbrush to remove oral residue that was anteriorly spilling from his oral cavity(suspect from breakfast) and for basic self-care tasks like washing his face. Despite tasks, patient still did not arouse. RN aware and vitals taken [facility]. Patient missed remaining 10 minutes of session due to fatigue. Patient left at RN station. Continue with current plan of care.      Pain No/Denies Pain   Therapy/Group: Individual Therapy  Davius Goudeau 11/23/2018, 10:05 AM

## 2018-11-23 NOTE — Progress Notes (Signed)
Occupational Therapy Note  Patient Details  Name: Walter Larsen MRN: [id number] Date of Birth: 1935/05/22  Today's Date: 11/23/2018 OT Missed Time: 24 Minutes Missed Time Reason: Patient fatigue  Pt asleep, semi-reclined in regular bed. OT utilized sternal rub, cold towel to face, and environmental changes, but too lethargic and would not wake. Will follow up per plan of care.   [name] [name] 11/23/2018, 3:15 PM

## 2018-11-23 NOTE — Progress Notes (Signed)
Patient remains lethargic throughout the day-does not fully awaken. MD Michele Rockers PA aware. Order received. IV started to Lt arm- antibiotic started and fluids running. Vitals stable- temp is 99.1 this afternoon. Patient aroused briefly with transfer to low bed this afternoon, followed some simple commands intermittently but kept eyes closed. Noted patient showing signs of pain (grimacing/moaning) when his neck is shifted and he is repositioned in bed. Meds held as patient still not awake enough to take anything po.

## 2018-11-23 NOTE — Progress Notes (Signed)
At shift change pt able to open eyes briefly and looking around-pt does not respond when asked if he can see me. Patient more verbal with staff and able to follow simple commands. Handed off to night shift RN.

## 2018-11-23 NOTE — Care Management (Signed)
[facility] Individual Statement of Services  Patient Name:  Walter Larsen  Date:  11/23/2018  Welcome to the [facility].  Our goal is to provide you with an individualized program based on your diagnosis and situation, designed to meet your specific needs.  With this comprehensive rehabilitation program, you will be expected to participate in at least 3 hours of rehabilitation therapies Monday-Friday, with modified therapy programming on the weekends.  Your rehabilitation program will include the following services:  Physical Therapy (PT), Occupational Therapy (OT), Speech Therapy (ST), 24 hour per day rehabilitation nursing, Therapeutic Recreaction (TR), Neuropsychology, Case Management (Social Worker), Rehabilitation Medicine, Nutrition Services and Pharmacy Services  Weekly team conferences will be held on Tuesdays to discuss your progress.  Your Social Worker will talk with you frequently to get your input and to update you on team discussions.  Team conferences with you and your family in attendance may also be held.  Expected length of stay: 14-16 days   Overall anticipated outcome: minimal assistance  Depending on your progress and recovery, your program may change. Your Social Worker will coordinate services and will keep you informed of any changes. Your Social Worker's name and contact numbers are listed  below.  The following services may also be recommended but are not provided by the [facility] will be made to provide these services after discharge if needed.  Arrangements include referral to agencies that provide these services.  Your insurance has been verified to be:  [employer] Your primary doctor is:  Engineer, site  Pertinent information will be shared with your doctor and your insurance  company.  Social Worker:  [place], [place] or ([phone]   Information discussed with and copy given to patient by: Lennart Pall, 11/23/2018, 11:45 AM

## 2018-11-23 NOTE — Progress Notes (Signed)
[facility] PHYSICAL MEDICINE & REHABILITATION PROGRESS NOTE   Subjective/Complaints: Found patient in bed with NT next to him trying to feed him. Pt lying and really not responding.   ROS: Limited due to cognitive/behavioral    Objective:   Ct Head Wo Contrast  Result Date: 11/23/2018 CLINICAL DATA:  Fatigue and malaise EXAM: CT HEAD WITHOUT CONTRAST TECHNIQUE: Contiguous axial images were obtained from the base of the skull through the vertex without intravenous contrast. COMPARISON:  11/15/2018 FINDINGS: Brain: Decreased size, peripheral density, and circumscription of subacute hematomas in the right posterior temporal and left occipitotemporal lobes. Associated edema is unchanged. Dense encephalomalacia in the right occipital lobe. 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 Ventriculomegaly primarily related to central predominant volume loss. No midline shift. No interval cytotoxic edema. Vascular: Atherosclerotic calcification Skull: Negative Sinuses/Orbits: Bilateral cataract resection IMPRESSION: Large bilateral cerebral hematomas show expected evolution with decreasing size. Similar degree of associated edema and local mass effect. No new abnormality. Electronically Signed   By: Monte Fantasia M.D.   On: 11/23/2018 11:44   Dg Chest Port 1 View  Result Date: 11/23/2018 CLINICAL DATA:  Lethargy. EXAM: PORTABLE CHEST 1 VIEW COMPARISON:  11/15/2018. FINDINGS: Mediastinum and hilar structures normal. Heart size normal. Interim near complete resolution of right base subsegmental atelectasis. No pleural effusion or pneumothorax. Degenerative change thoracic spine. IMPRESSION: Interim near complete resolution of right base subsegmental atelectasis. No acute cardiopulmonary disease. Electronically Signed   By: Marcello Moores  Register   On: 11/23/2018 11:02   Recent Labs    11/22/18 0723 11/23/18 1053  WBC 14.6* 18.3*  HGB 15.3 16.0  HCT 46.2 47.1  PLT 259 264    Recent Labs    11/22/18 0723 11/23/18 1053  NA 136 137  K 3.2* 4.1  CL 99 102  CO2 24 22  GLUCOSE 115* 138*  BUN 11 14  CREATININE 0.80 0.90  CALCIUM 9.0 9.8    Intake/Output Summary (Last 24 hours) at 11/23/2018 1200 Last data filed at 11/23/2018 0845 Gross per 24 hour  Intake 70 ml  Output 1200 ml  Net -1130 ml     Physical Exam: Vital Signs Blood pressure (!) 142/74, pulse 76, temperature 98.2 F (36.8 C), temperature source Oral, resp. rate 18, SpO2 93 %. Constitutional: No distress . Vital signs reviewed. HEENT: EOMI, oral membranes moist Neck: supple Cardiovascular: RRR without murmur. No JVD    Respiratory: CTA Bilaterally without wheezes or rales. Normal effort    GI: BS +, non-tender, non-distended  Musculoskeletal:  General: No edema.  Neurological: Alert, oriented to hospital, person only. follows comands sporadically. Unable to ID any objects, colors in front of him. ?light/dark. Normal language. Keeps eyes closed.  Moves all 4's.  Skin: Skin iswarmand dry.  Psychiatric: flat    Assessment/Plan: 1. Functional deficits secondary to bilateral posterior circulation infarcts which require 3+ hours per day of interdisciplinary therapy in a comprehensive inpatient rehab setting.  Physiatrist is providing close team supervision and 24 hour management of active medical problems listed below.  Physiatrist and rehab team continue to assess barriers to discharge/monitor patient progress toward functional and medical goals  Care Tool:  Bathing    Body parts bathed by patient: Right arm, Left arm, Chest, Abdomen, Face   Body parts bathed by helper: Front perineal area, Buttocks, Right upper leg, Left upper leg, Left lower leg, Right lower leg     Bathing assist Assist Level: Maximal Assistance - Patient 24 - 49%  Upper Body Dressing/Undressing Upper body dressing   What is the patient wearing?: Pull over shirt    Upper body assist Assist  Level: Total Assistance - Patient < 25%    Lower Body Dressing/Undressing Lower body dressing      What is the patient wearing?: Pants     Lower body assist Assist for lower body dressing: Total Assistance - Patient < 25%     Toileting Toileting    Toileting assist Assist for toileting: Total Assistance - Patient < 25%     Transfers Chair/bed transfer  Transfers assist     Chair/bed transfer assist level: Dependent - mechanical lift     Locomotion Ambulation   Ambulation assist   Ambulation activity did not occur: Safety/medical concerns          Walk 10 feet activity   Assist  Walk 10 feet activity did not occur: Safety/medical concerns        Walk 50 feet activity   Assist Walk 50 feet with 2 turns activity did not occur: Safety/medical concerns         Walk 150 feet activity   Assist Walk 150 feet activity did not occur: Safety/medical concerns         Walk 10 feet on uneven surface  activity   Assist Walk 10 feet on uneven surfaces activity did not occur: Safety/medical concerns         Wheelchair     Assist Will patient use wheelchair at discharge?: No   Wheelchair activity did not occur: N/A         Wheelchair 50 feet with 2 turns activity    Assist    Wheelchair 50 feet with 2 turns activity did not occur: N/A       Wheelchair 150 feet activity     Assist Wheelchair 150 feet activity did not occur: N/A        Medical Problem List and Plan: 1.Functional deficitssecondary to [place] with cortical blindness and confusion --Patient is beginning CIR therapies today including PT, OT, and SLP  -enclosure bed for safety still required---may move to regular bed soon.  2. Antithrombotics: -DVT/anticoagulation:Mechanical:Sequential compression devices, below kneeBilateral lower extremities -antiplatelet therapy: N/A 3. Pain Management:N/A 4. Mood:LCSW to follow  for evaluation and support when appropriate -antipsychotic agents: continue scheduled seroquel at HS, reduce to 25mg  -maintain sleep chart, sleep cycle improving 5. Neuropsych: This patientis notcapable of making decisions on hisown behalf. 6. Skin/Wound Care:Routine pressure relief measures 7. Fluids/Electrolytes/Nutrition: continue to push po, supervision with meals  -supplements between meals  -continue to replete K+ (3.2) 8.HTN:  Continue amlodipine and Lopressor.  Fair control at present 9.Agitation: Continue Depakote every 8 hours.  -level a little low (42) but agitation improved. Will not increase dose   -seroquel as above 10.GERD: On Protonix 11.Leukocytosis: Monitor for signs of infection. 12. Urinary retention: Continue Flomax.   -UA negative, UCX negative  - Monitor voiding with bladder scan and cath for volumes > 350 cc   -still retaining 300+cc  -OOB to void  13. Lethargy: likely infectious and/or pharm-induced with WBC's up to 18k today  -CXR and CT without acute changes  -re-check urine for ua, ucx.  Begin empiric ceftriaxone  -stop vpa, seroquel reduced.  -supportive measures  -begin IVF 1/2ns 75 cc/hr  Greater than 35 total minutes was spent examining patient, formulating a treatment plan, and in reviewing scans and pertinent labs.      LOS: 4 days A FACE TO FACE  EVALUATION WAS PERFORMED  Meredith Staggers 11/23/2018, 12:00 PM

## 2018-11-23 NOTE — Progress Notes (Addendum)
Physical Therapy Session Note  Patient Details  Name: Walter Larsen MRN: [id number] Date of Birth: May 05, 1935  Today's Date: 11/23/2018 PT Individual Time: 5170-0174 AND [phone] AND 1525-1535 PT Individual Time Calculation (min): 30 min AND 40 min AND 10 min  Short Term Goals: Week 1:  PT Short Term Goal 1 (Week 1): Pt will transfer bed<>chair w/ mod assist PT Short Term Goal 2 (Week 1): Pt will initiate gait training PT Short Term Goal 3 (Week 1): Pt will perform bed mobility w/ mod assist PT Short Term Goal 4 (Week 1): Pt will initiate functional tasks w/ mod cues, 75% of the time  Skilled Therapeutic Interventions/Progress Updates:   Session 1:  Pt asleep in supine, unable to fully arouse w/ verbal and tactile stimuli, including sternal rub. Dependent transfer to EOB and max assist to maintain static sitting balance, pt w/ strong L lateral lean. Pt minimally responding verbally and not opening eyes. Continued sternal rub and applied cold wash cloth to eyes w/o any change. Returned to supine, total assist. R/L rolling to don maximove sling. Maximove transfer to [place] chair, pt remained asleep during rolling, during transfer, and during repositioning in chair. Took pt to nurses station in attempts to increase arousal. Ended session at nurses station and in care of NT.   Session 2:  Pt received in TIS, asleep. Pt unresponsive to sternal rub and auditory stimuli. Called pt's son and discussed what pt's interests where PTA. Son stated pt enjoyed baseball, being active around the house, and loved eating. Attempted to engage pt w/ shows he likes including "gunsmoke". Pt placed fully upright and remained unresponsive. Repositioned pt in chair and pt moaning out in pain w/ passive neck movement, pt in severe flexed and L sidebending. Max-total assist x2 to dependently reposition in chair. Used pillows and towel rolls to prop head into neutral positioning to prevent further contracture/tightness. Pt  remained unresponsive through positioning, however vitals WNL. Ended session in care of NT at [facility] in [place]. All needs met.   Session 3: Pt asleep in supine, continues to be unarousable despite sternal rub, auditory stimuli, and vestibular input. Therapist dependently repositioned pt in bed to neutral positioning, pt moaned during this. Suspect 2/2 neck pain as pt in flexed and L rotation position. Therapist provided passive stretching to reach neutral rotation and extension, heat and STM also applied w/ improved tolerance to stretch. Pillow propped between bed rail and head to maintain neutral position, no evidence of pain at this point.   Therapy Documentation Precautions:  Precautions Precautions: Fall Precaution Comments: cortical blindness Restrictions Weight Bearing Restrictions: No  Therapy/Group: Individual Therapy  Margretta Zamorano Clent Demark 11/23/2018, 8:50 AM

## 2018-11-24 ENCOUNTER — Inpatient Hospital Stay (HOSPITAL_COMMUNITY): Payer: Medicare HMO

## 2018-11-24 ENCOUNTER — Inpatient Hospital Stay (HOSPITAL_COMMUNITY): Payer: Medicare HMO | Admitting: Speech Pathology

## 2018-11-24 ENCOUNTER — Inpatient Hospital Stay (HOSPITAL_COMMUNITY): Payer: Medicare HMO | Admitting: Physical Therapy

## 2018-11-24 DIAGNOSIS — R5383 Other fatigue: Secondary | ICD-10-CM

## 2018-11-24 DIAGNOSIS — R339 Retention of urine, unspecified: Secondary | ICD-10-CM

## 2018-11-24 DIAGNOSIS — D72829 Elevated white blood cell count, unspecified: Secondary | ICD-10-CM

## 2018-11-24 DIAGNOSIS — I10 Essential (primary) hypertension: Secondary | ICD-10-CM

## 2018-11-24 LAB — CBC WITH DIFFERENTIAL/PLATELET
Abs Immature Granulocytes: 0.14 10*3/uL — ABNORMAL HIGH (ref 0.00–0.07)
Basophils Absolute: 0.1 10*3/uL (ref 0.0–0.1)
Basophils Relative: 0 %
Eosinophils Absolute: 0 10*3/uL (ref 0.0–0.5)
Eosinophils Relative: 0 %
HCT: 50 % (ref 39.0–52.0)
Hemoglobin: 16.8 g/dL (ref 13.0–17.0)
Immature Granulocytes: 1 %
Lymphocytes Relative: 11 %
Lymphs Abs: 1.5 10*3/uL (ref 0.7–4.0)
MCH: 31.8 pg (ref 26.0–34.0)
MCHC: 33.6 g/dL (ref 30.0–36.0)
MCV: 94.5 fL (ref 80.0–100.0)
Monocytes Absolute: 2.3 10*3/uL — ABNORMAL HIGH (ref 0.1–1.0)
Monocytes Relative: 16 %
Neutro Abs: 9.9 10*3/uL — ABNORMAL HIGH (ref 1.7–7.7)
Neutrophils Relative %: 72 %
Platelets: 264 10*3/uL (ref 150–400)
RBC: 5.29 MIL/uL (ref 4.22–5.81)
RDW: 12.8 % (ref 11.5–15.5)
WBC: 13.9 10*3/uL — ABNORMAL HIGH (ref 4.0–10.5)
nRBC: 0 % (ref 0.0–0.2)

## 2018-11-24 LAB — URINE CULTURE: Culture: <10000 — AB

## 2018-11-24 MED ORDER — AMLODIPINE BESYLATE 5 MG PO TABS
7.5000 mg | ORAL_TABLET | Freq: Every day | ORAL | Status: DC
  Administered 2018-11-25 – 2018-12-05 (×10): 7.5 mg via ORAL
  Filled 2018-11-24 (×12): qty 1

## 2018-11-24 MED ORDER — AMLODIPINE BESYLATE 2.5 MG PO TABS
2.5000 mg | ORAL_TABLET | Freq: Once | ORAL | Status: AC
  Administered 2018-11-24: 2.5 mg via ORAL
  Filled 2018-11-24: qty 1

## 2018-11-24 NOTE — Progress Notes (Signed)
Occupational Therapy Session Note  Patient Details  Name: BRYANN MCNEALY MRN: [id number] Date of Birth: 09-28-1935  Today's Date: 11/24/2018 OT Individual Time: 1300-1400 OT Individual Time Calculation (min): 60 min    Short Term Goals: Week 1:     Skilled Therapeutic Interventions/Progress Updates:    Pt received sitting in TIS at nurses desk. Pt was taken to dayroom to eat lunch. Pt began session more alert and gradually became less responsive throughout. Pt ate 10% of lunch, requiring total A for self-feeding and max cueing for swallowing. Lunch was eventually terminated 2/2 safety d/t pt no longer safely attending to task. Pt had delayed coughing during both D1 diet and thin liquid consumption. Pt was taken back to room for oral hygiene. Total A to use suction toothbrush. +2 assistance was obtained and pt was transferred back to bed via maximove. Pt's brief was changed, requiring total A to roll R and L. Pt was left supine with all needs met, bed alarm set.   Therapy Documentation Precautions:  Precautions Precautions: Fall Precaution Comments: cortical blindness Restrictions Weight Bearing Restrictions: No   Therapy/Group: Individual Therapy  Curtis Sites 11/24/2018, 2:14 PM

## 2018-11-24 NOTE — Progress Notes (Signed)
Physical Therapy Session Note  Patient Details  Name: Walter Larsen MRN: [id number] Date of Birth: February 10, 1936  Today's Date: 11/24/2018 PT Individual Time: 0945-1030 PT Individual Time Calculation (min): 45 min   Short Term Goals: Week 1:  PT Short Term Goal 1 (Week 1): Pt will transfer bed<>chair w/ mod assist PT Short Term Goal 2 (Week 1): Pt will initiate gait training PT Short Term Goal 3 (Week 1): Pt will perform bed mobility w/ mod assist PT Short Term Goal 4 (Week 1): Pt will initiate functional tasks w/ mod cues, 75% of the time  Skilled Therapeutic Interventions/Progress Updates:   Pt asleep in supine, difficult to arouse, however verbalizing more today and following simple, 1-step commands ~25% of the time w/ max cues. Pt did not open eyes majority of session. R/L rolling, dependently, to don maximove sling. Maximove transfer to [facility]. Total assist w/c transport to therapy gym. Set-up to eat breakfast in full upright in TIS. Pt continued to verbalize appropriately, however very automatic and general phrases such as "it will be alright" and "thank you honey". Agreeable to take sips of orange juice and coffee, total assist from therapist, pt unable to sequence grasping cup and sipping at same time. Total assist for bites of food for same reason. Max verbal and tactile cues to attend to task of chewing and swallowing. Therapist frequently had to visualize inside of mouth to make sure pt was clearing all food. No coughing noted. Ended session in TIS and at nurses station, in care of NT and all needs met.   Therapy Documentation Precautions:  Precautions Precautions: Fall Precaution Comments: cortical blindness Restrictions Weight Bearing Restrictions: No Pain: Pain Assessment Pain Scale: 0-10 Pain Score: 0-No pain   Therapy/Group: Individual Therapy  Nautia Lem Clent Demark 11/24/2018, 12:03 PM

## 2018-11-24 NOTE — Progress Notes (Signed)
Social Work Patient ID: Walter Larsen, male   DOB: [birth year], 83 y.o.   MRN: [id number]   Spoke with pt's son, [name], yesterday following team conference.  He is aware of team concerns and pt's decreased arousal and medical work up being done to try and determine cause.  Stressed to son that initial goals set for min assist overall and that he will definitely require 24/7 and, possibly, greater than min assist.  Son agrees with team concerns and asks "what are our options if we need skilled (SNF) and they (insurance) won't cover it?"  Son aware this is a possibility and, if this it the case, then family would need to make arrangements to take home.  Son asks to "see how he does" and plan to follow up with him end of week.  Kristyana Notte, LCSW

## 2018-11-24 NOTE — Progress Notes (Signed)
Speech Language Pathology Daily Session Note  Patient Details  Name: Walter Larsen MRN: [id number] Date of Birth: 1935-12-09  Today's Date: 11/24/2018 SLP Individual Time: 6468-0321 SLP Individual Time Calculation (min): 25 min  Short Term Goals: Week 1: SLP Short Term Goal 1 (Week 1): Pt will sustain attention to basic familiar task for ~ 1 minute with Max A multimodal cues. SLP Short Term Goal 2 (Week 1): Pt will follow 1 step basic directions in 2 out of 10 opportunities with Max A multimodal cues. SLP Short Term Goal 3 (Week 1): Pt will consume current diet with minimal overt s/s of aspiration and Mod A cues for use of compensatory swallow strategies.  Skilled Therapeutic Interventions: Skilled treatment session focused on cognitive goals. Upon arrival, patient was laying laterally in bed with his feet on the tray table. Patient was awake with language of confusion with inability to follow commands. With +2 assist, patient was repositioned in bed which appeared to maximize the patient's comfort. Despite Max A multimodal cues, patient was unable to arouse enough to consume solid textures safely but did consume a sip of thin liquids with minimal overt s/s of aspiration. Patient kept his eyes closed during the duration of the session with total A needed for all tasks. Patient left upright in bed with alarm on and all needs within reach. Continue with current plan of care.      Pain No/Denies Pain   Therapy/Group: Individual Therapy  Delphia Kaylor 11/24/2018, 3:03 PM

## 2018-11-24 NOTE — Progress Notes (Signed)
[place] PHYSICAL MEDICINE & REHABILITATION PROGRESS NOTE   Subjective/Complaints: Patient seen sitting up in bed this morning.  Patient seen and examined with resident physician.  He slept well overnight per sleep chart.  His eyes remain closed and is difficult to arouse.  ROS: Limited due to cognition/lethargy   Objective:   Ct Head Wo Contrast  Result Date: 11/23/2018 CLINICAL DATA:  Fatigue and malaise EXAM: CT HEAD WITHOUT CONTRAST TECHNIQUE: Contiguous axial images were obtained from the base of the skull through the vertex without intravenous contrast. COMPARISON:  11/15/2018 FINDINGS: Brain: Decreased size, peripheral density, and circumscription of subacute hematomas in the right posterior temporal and left occipitotemporal lobes. Associated edema is unchanged. Dense encephalomalacia in the right occipital lobe. 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 Ventriculomegaly primarily related to central predominant volume loss. No midline shift. No interval cytotoxic edema. Vascular: Atherosclerotic calcification Skull: Negative Sinuses/Orbits: Bilateral cataract resection IMPRESSION: Large bilateral cerebral hematomas show expected evolution with decreasing size. Similar degree of associated edema and local mass effect. No new abnormality. Electronically Signed   By: Monte Fantasia M.D.   On: 11/23/2018 11:44   Dg Chest Port 1 View  Result Date: 11/23/2018 CLINICAL DATA:  Lethargy. EXAM: PORTABLE CHEST 1 VIEW COMPARISON:  11/15/2018. FINDINGS: Mediastinum and hilar structures normal. Heart size normal. Interim near complete resolution of right base subsegmental atelectasis. No pleural effusion or pneumothorax. Degenerative change thoracic spine. IMPRESSION: Interim near complete resolution of right base subsegmental atelectasis. No acute cardiopulmonary disease. Electronically Signed   By: Marcello Moores  Register   On: 11/23/2018 11:02   Recent Labs     11/23/18 1053 11/24/18 0552  WBC 18.3* 13.9*  HGB 16.0 16.8  HCT 47.1 50.0  PLT 264 264   Recent Labs    11/22/18 0723 11/23/18 1053  NA 136 137  K 3.2* 4.1  CL 99 102  CO2 24 22  GLUCOSE 115* 138*  BUN 11 14  CREATININE 0.80 0.90  CALCIUM 9.0 9.8    Intake/Output Summary (Last 24 hours) at 11/24/2018 0938 Last data filed at 11/24/2018 0517 Gross per 24 hour  Intake 1120 ml  Output 1031 ml  Net 89 ml     Physical Exam: Vital Signs Blood pressure (!) 172/101, pulse 89, temperature 98 F (36.7 C), resp. rate 18, SpO2 97 %. Constitutional: No distress . Vital signs reviewed. HENT: Normocephalic.  Atraumatic. Eyes: EOMI. No discharge. Cardiovascular: No JVD.  RRR. Respiratory: Normal effort.  No stridor.  Clear. GI: Non-distended.  BS +. Skin: Warm and dry.  Intact. Psych: Normal mood.  Normal behavior. Musc: No edema.  No tenderness. Neurological: Somnolent Not following commands, not seen moving any extremities. Skin: Skin iswarmand dry.  Psychiatric:Difficult to assess due to cognition    Assessment/Plan: 1. Functional deficits secondary to bilateral posterior circulation infarcts which require 3+ hours per day of interdisciplinary therapy in a comprehensive inpatient rehab setting.  Physiatrist is providing close team supervision and 24 hour management of active medical problems listed below.  Physiatrist and rehab team continue to assess barriers to discharge/monitor patient progress toward functional and medical goals  Care Tool:  Bathing    Body parts bathed by patient: Right arm, Left arm, Chest, Abdomen, Face   Body parts bathed by helper: Front perineal area, Buttocks, Right upper leg, Left upper leg, Left lower leg, Right lower leg     Bathing assist Assist Level: Maximal Assistance - Patient 24 - 49%  Upper Body Dressing/Undressing Upper body dressing   What is the patient wearing?: Pull over shirt    Upper body assist Assist  Level: Total Assistance - Patient < 25%    Lower Body Dressing/Undressing Lower body dressing      What is the patient wearing?: Pants     Lower body assist Assist for lower body dressing: Total Assistance - Patient < 25%     Toileting Toileting    Toileting assist Assist for toileting: Total Assistance - Patient < 25%     Transfers Chair/bed transfer  Transfers assist     Chair/bed transfer assist level: Dependent - mechanical lift     Locomotion Ambulation   Ambulation assist   Ambulation activity did not occur: Safety/medical concerns          Walk 10 feet activity   Assist  Walk 10 feet activity did not occur: Safety/medical concerns        Walk 50 feet activity   Assist Walk 50 feet with 2 turns activity did not occur: Safety/medical concerns         Walk 150 feet activity   Assist Walk 150 feet activity did not occur: Safety/medical concerns         Walk 10 feet on uneven surface  activity   Assist Walk 10 feet on uneven surfaces activity did not occur: Safety/medical concerns         Wheelchair     Assist Will patient use wheelchair at discharge?: No   Wheelchair activity did not occur: N/A         Wheelchair 50 feet with 2 turns activity    Assist    Wheelchair 50 feet with 2 turns activity did not occur: N/A       Wheelchair 150 feet activity     Assist Wheelchair 150 feet activity did not occur: N/A        Medical Problem List and Plan: 1.Functional deficitssecondary to [place] with cortical blindness and confusion  Continue CIR  Repeat head CT on 8/11 showing evolution/improvement 2. Antithrombotics: -DVT/anticoagulation:Mechanical:Sequential compression devices, below kneeBilateral lower extremities -antiplatelet therapy: N/A 3. Pain Management:N/A 4. Mood:LCSW to follow for evaluation and support when appropriate -antipsychotic agents: continue scheduled  seroquel at HS, reduce to 25mg  -maintain sleep chart, sleep cycle improving  Not require canopy bed anymore 5. Neuropsych: This patientis notcapable of making decisions on hisown behalf. 6. Skin/Wound Care:Routine pressure relief measures 7. Fluids/Electrolytes/Nutrition: continue to push po, supervision with meals  -supplements between meals 8.CZY:SAYTKZSW Lopressor.    Amlodipine increased to 7.5 on 8/12  9.Agitation: Continue Depakote every 8 hours.  -seroquel as above 10.GERD: On Protonix 11.Leukocytosis: Monitor for signs of infection.  WBCs 13.9 on 8/12 12. Urinary retention: Continue Flomax.   -UA negative, UCX negative  - Monitor voiding with bladder scan and cath for volumes > 350 cc  -OOB to void 13. Lethargy: likely infectious and/or pharm-induced   -CXR and CT without acute changes  -re-check urine for ua-relatively unremarkable, urine culture pending  empiric ceftriaxone started  -stop vpa, seroquel reduced.  -supportive measures  -begin IVF 1/2ns 75 cc/hr  LOS: 5 days A FACE TO FACE EVALUATION WAS PERFORMED  Matalyn Nawaz Lorie Phenix 11/24/2018, 9:38 AM

## 2018-11-24 NOTE — Plan of Care (Signed)
  Problem: RH SAFETY Goal: RH STG ADHERE TO SAFETY PRECAUTIONS W/ASSISTANCE/DEVICE Description: STG Adhere to Safety Precautions With Assistance/Device. Max Outcome: Progressing   Problem: RH Vision Goal: RH LTG Vision (Specify) Outcome: Progressing   Problem: RH BOWEL ELIMINATION Goal: RH STG MANAGE BOWEL WITH ASSISTANCE Description: STG Manage Bowel with Assistance. Mod Outcome: Not Progressing Note: Total assist    Problem: RH BLADDER ELIMINATION Goal: RH STG MANAGE BLADDER WITH ASSISTANCE Description: STG Manage Bladder With Assistance. Mod Outcome: Not Progressing Note: Total assist    Problem: RH SKIN INTEGRITY Goal: RH STG MAINTAIN SKIN INTEGRITY WITH ASSISTANCE Description: STG Maintain Skin Integrity With Assistance. Mod Outcome: Not Progressing   Problem: RH COGNITION-NURSING Goal: RH STG USES MEMORY AIDS/STRATEGIES W/ASSIST TO PROBLEM SOLVE Description: STG Uses Memory Aids/Strategies With Assistance to Problem Solve. Mod Outcome: Not Progressing   Problem: Consults Goal: RH BRAIN INJURY PATIENT EDUCATION Description: Description: See Patient Education module for eduction specifics Outcome: Not Progressing

## 2018-11-24 NOTE — Patient Care Conference (Signed)
Inpatient RehabilitationTeam Conference and Plan of Care Update Date: 11/23/2018   Time: 10:40 AM    Patient Name: Walter Larsen      Medical Record Number: [id number]  Date of Birth: Sep 06, 1935 Sex: Male         Room/Bed: 4W11C/4W11C-01 Payor Info: Payor: [employer] MEDICARE / Plan: [employer] MEDICARE HMO/PPO / Product Type: *No Product type* /    Admitting Diagnosis: 2. ABI Team  B CVA, ICH; 22-24days  Admit Date/Time:  11/19/2018  3:31 PM Admission Comments: No comment available   Primary Diagnosis:  ICH (intracerebral hemorrhage) ([place]) Principal Problem: ICH (intracerebral hemorrhage) ([place])  Patient Active Problem List   Diagnosis Date Noted  . Lethargy   . Urinary retention   . Leukocytosis   . Essential hypertension   . Confusion 11/19/2018  . Dysphagia 11/19/2018  . 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 BPH (benign prostatic hyperplasia) 11/18/2018  . Cytotoxic brain edema ([place]) 11/15/2018  . Coronary artery disease involving native coronary artery of native heart without angina pectoris 01/10/2015  . Dyslipidemia 01/10/2015  . Cognitive deficits, late effect of cerebrovascular disease 11/07/2013  . Left homonymous hemianopsia 05/10/2013  . Presence of IVC filter 05/10/2013  . ICH (intracerebral hemorrhage) ([place]) 03/15/2013  . Dry eyes 03/08/2013  . Hyponatremia 03/08/2013  . Headache 03/08/2013  . Acute encephalopathy 03/08/2013  . Pulmonary emboli ([place]) 03/08/2013  . Other and unspecified hyperlipidemia 03/02/2013  . Bradycardia 03/02/2013  . Hypokalemia 03/02/2013  . Hemorrhagic stroke ([place]) 03/01/2013  . Essential hypertension, benign 03/01/2013  . Fever, unspecified 03/01/2013    Expected Discharge Date: Expected Discharge Date: (TBD)  Team Members Present: Physician leading conference: Dr. Alger Simons Social Worker Present: Lennart Pall, LCSW Nurse Present: Dwaine Gale, RN PT Present: Burnard Bunting, PT OT Present: Cherylynn Ridges, OT SLP Present: Weston Anna,  SLP PPS Coordinator present : Gunnar Fusi, SLP     Current Status/Progress Goal Weekly Team Focus  Medical   posterior circulation infarcts. more lethargy today, cortical blindness  stabilize medically improve arousal  see aove   Bowel/Bladder   incontinent of B&B LBM 8/10  timed toileting while awake  assess toileting needs qshift/prn   Swallow/Nutrition/ Hydration   Dys. 1 textures with thin liquids, Total A  Mod A  tolerance and safety with current diet   ADL's   Mod/max A transfers, Max/Total A BADL tasks  Min A overall  attention, initiation, problem solving, self-care retraining, functional transfers   Mobility   max-total assist overall 2/2 decreased awareness and lethargy, squat pivot transfers, have not been able to initiate gait  min assist  OOB tolerance, cognitive remediation, visual compensations, all functional mobility   Communication             Safety/Cognition/ Behavioral Observations  Total A  Max A  initiation, orientation, attention, recall   Pain   no c/o pain  remain pain free  assess qshift/prn   Skin   no skin issues  maintain intact skin and free from infection  assess qshift/prn    Rehab Goals Patient on target to meet rehab goals: No Rehab Goals Revised: monitor alertness - may need to change goals if no improvement *See Care Plan and progress notes for long and short-term goals.     Barriers to Discharge  Current Status/Progress Possible Resolutions Date Resolved   Physician    Medical stability               Nursing  PT  Other (comments);Behavior  new cortical blindness              OT                  SLP                SW Decreased caregiver support              Discharge Planning/Teaching Needs:  Plan upon admission is for pt to return home with wife, son and potential private duty caregiver providing 24/7 care.  Wife unable to provide any level of care than supervision.  Son works days.  Teaching to be planned  closer to d/c.   Team Discussion:  CVA with new cortical blindness;  Very decreased arousal.  MD uncertain of cause and checking scans and labs at this time.  Requires caths and incont bowel.  Today pt is "non-responsive" per therapies and nsg.  Left lateral lean - ?new?  Max to total assist overall.  SW concerned that care is too great for family unless great improvement made and insurance will likely not cover SNF.    Revisions to Treatment Plan:  NA at this time - may need to revise original goals.    Continued Need for Acute Rehabilitation Level of Care: The patient requires daily medical management by a physician with specialized training in physical medicine and rehabilitation for the following conditions: Daily direction of a multidisciplinary physical rehabilitation program to ensure safe treatment while eliciting the highest outcome that is of practical value to the patient.: Yes Daily medical management of patient stability for increased activity during participation in an intensive rehabilitation regime.: Yes Daily analysis of laboratory values and/or radiology reports with any subsequent need for medication adjustment of medical intervention for : Neurological problems   I attest that I was present, lead the team conference, and concur with the assessment and plan of the team.   Naome Brigandi 11/24/2018, 11:10 AM    Team conference was held via web/ teleconference due to [place] - 19

## 2018-11-25 ENCOUNTER — Inpatient Hospital Stay (HOSPITAL_COMMUNITY): Payer: Medicare HMO | Admitting: Occupational Therapy

## 2018-11-25 ENCOUNTER — Inpatient Hospital Stay (HOSPITAL_COMMUNITY): Payer: Medicare HMO | Admitting: Physical Therapy

## 2018-11-25 ENCOUNTER — Inpatient Hospital Stay (HOSPITAL_COMMUNITY): Payer: Medicare HMO | Admitting: Speech Pathology

## 2018-11-25 MED ORDER — PANTOPRAZOLE SODIUM 40 MG PO PACK
40.0000 mg | PACK | Freq: Every day | ORAL | Status: DC
  Administered 2018-11-25 – 2018-12-03 (×9): 40 mg
  Filled 2018-11-25 (×9): qty 20

## 2018-11-25 MED ORDER — QUETIAPINE FUMARATE 50 MG PO TABS
50.0000 mg | ORAL_TABLET | Freq: Every day | ORAL | Status: DC
  Administered 2018-11-25: 20:00:00 50 mg via ORAL
  Filled 2018-11-25: qty 1

## 2018-11-25 NOTE — Progress Notes (Signed)
[place] PHYSICAL MEDICINE & REHABILITATION PROGRESS NOTE   Subjective/Complaints: Pt did not sleep overnight. Very restless. Pt arouses but is confused this morning.  ROS: Limited due to cognitive/behavioral   Objective:   No results found. Recent Labs    11/23/18 1053 11/24/18 0552  WBC 18.3* 13.9*  HGB 16.0 16.8  HCT 47.1 50.0  PLT 264 264   Recent Labs    11/23/18 1053  NA 137  K 4.1  CL 102  CO2 22  GLUCOSE 138*  BUN 14  CREATININE 0.90  CALCIUM 9.8    Intake/Output Summary (Last 24 hours) at 11/25/2018 1355 Last data filed at 11/25/2018 1217 Gross per 24 hour  Intake 2071.41 ml  Output 1675 ml  Net 396.41 ml     Physical Exam: Vital Signs Blood pressure (!) 181/93, pulse 81, temperature 98.2 F (36.8 C), resp. rate 18, SpO2 98 %. Constitutional: No distress . Vital signs reviewed. HEENT: EOMI, oral membranes moist Neck: supple Cardiovascular: RRR without murmur. No JVD    Respiratory: CTA Bilaterally without wheezes or rales. Normal effort    GI: BS +, non-tender, non-distended  Skin: Warm and dry.  Intact. Psych: Normal mood.  Normal behavior. Musc: No edema.  No tenderness. Neurological: Somnolent Not following commands, not seen moving any extremities. Skin: Skin iswarmand dry.  Psychiatric:remains somnolent    Assessment/Plan: 1. Functional deficits secondary to bilateral posterior circulation infarcts which require 3+ hours per day of interdisciplinary therapy in a comprehensive inpatient rehab setting.  Physiatrist is providing close team supervision and 24 hour management of active medical problems listed below.  Physiatrist and rehab team continue to assess barriers to discharge/monitor patient progress toward functional and medical goals  Care Tool:  Bathing    Body parts bathed by patient: Face   Body parts bathed by helper: Front perineal area, Buttocks, Right upper leg, Left upper leg, Left lower leg, Right lower leg     Bathing assist Assist Level: Maximal Assistance - Patient 24 - 49%     Upper Body Dressing/Undressing Upper body dressing   What is the patient wearing?: Pull over shirt    Upper body assist Assist Level: Total Assistance - Patient < 25%    Lower Body Dressing/Undressing Lower body dressing      What is the patient wearing?: Pants     Lower body assist Assist for lower body dressing: Total Assistance - Patient < 25%     Toileting Toileting    Toileting assist Assist for toileting: Total Assistance - Patient < 25%     Transfers Chair/bed transfer  Transfers assist     Chair/bed transfer assist level: Maximal Assistance - Patient 25 - 49%     Locomotion Ambulation   Ambulation assist   Ambulation activity did not occur: Safety/medical concerns  Assist level: 2 helpers Assistive device: No Device Max distance: 15'   Walk 10 feet activity   Assist  Walk 10 feet activity did not occur: Safety/medical concerns  Assist level: 2 helpers     Walk 50 feet activity   Assist Walk 50 feet with 2 turns activity did not occur: Safety/medical concerns         Walk 150 feet activity   Assist Walk 150 feet activity did not occur: Safety/medical concerns         Walk 10 feet on uneven surface  activity   Assist Walk 10 feet on uneven surfaces activity did not occur: Safety/medical concerns  Wheelchair     Assist Will patient use wheelchair at discharge?: No   Wheelchair activity did not occur: N/A         Wheelchair 50 feet with 2 turns activity    Assist    Wheelchair 50 feet with 2 turns activity did not occur: N/A       Wheelchair 150 feet activity     Assist Wheelchair 150 feet activity did not occur: N/A        Medical Problem List and Plan: 1.Functional deficitssecondary to [place] with cortical blindness and confusion  Continue CIR  Repeat head CT on 8/11 showing evolution/improvement 2.  Antithrombotics: -DVT/anticoagulation:Mechanical:Sequential compression devices, below kneeBilateral lower extremities -antiplatelet therapy: N/A 3. Pain Management:N/A 4. Mood:LCSW to follow for evaluation and support when appropriate -antipsychotic agents: continue scheduled seroquel at HS, increase back to 50mg  to help with sleep/wake cycle 5. Neuropsych: This patientis notcapable of making decisions on hisown behalf. 6. Skin/Wound Care:Routine pressure relief measures 7. Fluids/Electrolytes/Nutrition: continue to push po, supervision with meals  -supplements between meals 8.FTD:DUKGURKY Lopressor.    Amlodipine increased to 7.5 on 8/12  9.Agitation: Continue Depakote every 8 hours.  -seroquel as above 10.GERD: On Protonix 11.Leukocytosis: Monitor for signs of infection.  WBCs 13.9 on 8/12 12. Urinary retention: Continue Flomax.   -UA negative, UCX negative again.   - Monitor voiding with bladder scan and cath for volumes > 350 cc  -OOB to void 13. Lethargy: more alert today but didn't sleep last night   -CXR and CT without acute changes  -re-check urine for ua-relatively unremarkable, urine culture negative also  empiric ceftriaxone started---continue thru tomorrow pending repeat CBC, WBC's down yesterday  -stopped vpa  -supportive measures  - IVF 1/2ns 75 cc/hr  LOS: 6 days A FACE TO [place] 11/25/2018, 1:55 PM

## 2018-11-25 NOTE — Plan of Care (Signed)
  Problem: RH BOWEL ELIMINATION Goal: RH STG MANAGE BOWEL WITH ASSISTANCE Description: STG Manage Bowel with Assistance. Mod Outcome: Progressing   Problem: RH BLADDER ELIMINATION Goal: RH STG MANAGE BLADDER WITH ASSISTANCE Description: STG Manage Bladder With Assistance. Mod Outcome: Progressing   Problem: RH SKIN INTEGRITY Goal: RH STG MAINTAIN SKIN INTEGRITY WITH ASSISTANCE Description: STG Maintain Skin Integrity With Assistance. Mod Outcome: Progressing   Problem: RH SAFETY Goal: RH STG ADHERE TO SAFETY PRECAUTIONS W/ASSISTANCE/DEVICE Description: STG Adhere to Safety Precautions With Assistance/Device. Max Outcome: Progressing   Problem: RH COGNITION-NURSING Goal: RH STG USES MEMORY AIDS/STRATEGIES W/ASSIST TO PROBLEM SOLVE Description: STG Uses Memory Aids/Strategies With Assistance to Problem Solve. Mod Outcome: Progressing   Problem: RH Vision Goal: RH LTG Vision (Specify) Outcome: Progressing   Problem: Consults Goal: RH BRAIN INJURY PATIENT EDUCATION Description: Description: See Patient Education module for eduction specifics Outcome: Progressing

## 2018-11-25 NOTE — Progress Notes (Signed)
Physical Therapy Session Note  Patient Details  Name: Walter Larsen MRN: [id number] Date of Birth: 1936-02-24  Today's Date: 11/25/2018 PT Individual Time: 0800-0855 PT Individual Time Calculation (min): 55 min   Short Term Goals: Week 1:  PT Short Term Goal 1 (Week 1): Pt will transfer bed<>chair w/ mod assist PT Short Term Goal 2 (Week 1): Pt will initiate gait training PT Short Term Goal 3 (Week 1): Pt will perform bed mobility w/ mod assist PT Short Term Goal 4 (Week 1): Pt will initiate functional tasks w/ mod cues, 75% of the time  Skilled Therapeutic Interventions/Progress Updates:   Pt asleep in supine, however easily woken up and pt verbalizing and following commands, eyes remained closed however. BP elevated over last night, took this AM - 170/92, PA made aware and no further recommendations. Supine>sit, dependent 2/2 impaired motor planning and awareness. Once sitting EOB, pt w/ eyes open more consistently and pt visually scanning to sound of therapist's voice. Verbal and tactile cues for B hand placement for static sitting balance as well as technique w/ all functional mobility today. Sit<>stand w/ max assist and manual assist to facilitate anterior weight shift. Pt following step-by-step directions to stand and pivot to chair w/ max cues. Pt w/ posterior lean bias, unsure if related to upright orientation vs poor awareness. Total assist w/c transport to/from therapy gym. Worked on sit<>stands w/ RUE over therapist's shoulder, unable to reach full upright. Pt was able to reach full upright in stance w/ LUE over therapist's shoulder instead. Verbal and tactile cues to achieve upright, however posterior lean bias remained. Sit<>stands and gait 15' x2 via 3-musketeers and close w/c follow. Pt w/ heavy posterior lean at first during gait, anterior weight shift at hips improves towards end of gait bout. Pt taking steps w/o cues, verbal cues to increase step length and to attend to task. 4th  helper in front of pt providing verbal cues to "look upright", and reports his eyes are open but looking down in front of him. Ended session under direct supervision of NT at nurses station, all needs met.   Therapy Documentation Precautions:  Precautions Precautions: Fall Precaution Comments: cortical blindness Restrictions Weight Bearing Restrictions: No  Therapy/Group: Individual Therapy  Jasaiah Karwowski Clent Demark 11/25/2018, 8:57 AM

## 2018-11-25 NOTE — Progress Notes (Signed)
Occupational Therapy Session Note  Patient Details  Name: ELISHA COOKSEY MRN: [id number] Date of Birth: 02-15-1936  Today's Date: 11/25/2018 OT Individual Time: 0930-1000 OT Individual Time Calculation (min): 30 min    Skilled Therapeutic Interventions/Progress Updates:    Pt received in TIS w/c at nsg station.  Offered pt a coffee and when he tried to hold the styrofoam cup he squeezed it too hard and it almost spilled. He then did not want to drink it. Pt taken to room. He was verbally responsive, frequently stating "oh it will be alright". Pt handed toothbrush and he did brush but would not spit out, saying he was alright this way.   Pt did wash his face with no cues.  Combed hair with mod A. He did take bites of pineapple puree with total A to bring food to mouth on spoon. He did not initiate, but when edge of spoon touched his lip he did initiate to swallow.  He ate about 10-12 bites and then did not want anymore.   Pt taken back to nursing station for supervision.  Therapy Documentation Precautions:  Precautions Precautions: Fall Precaution Comments: cortical blindness Restrictions Weight Bearing Restrictions: No  Pain: Pain Assessment Pain Score: 0-No pain  Therapy/Group: Individual Therapy  [place] 11/25/2018, 11:39 AM

## 2018-11-25 NOTE — Progress Notes (Signed)
Speech Language Pathology Daily Session Note  Patient Details  Name: Walter Larsen MRN: [id number] Date of Birth: 08/08/1935  Today's Date: 11/25/2018 SLP Individual Time: 6967-8938 SLP Individual Time Calculation (min): 45 min  Short Term Goals: Week 1: SLP Short Term Goal 1 (Week 1): Pt will sustain attention to basic familiar task for ~ 1 minute with Max A multimodal cues. SLP Short Term Goal 2 (Week 1): Pt will follow 1 step basic directions in 2 out of 10 opportunities with Max A multimodal cues. SLP Short Term Goal 3 (Week 1): Pt will consume current diet with minimal overt s/s of aspiration and Mod A cues for use of compensatory swallow strategies.  Skilled Therapeutic Interventions: Pt was seen for skilled ST intervention targeting aforementioned goals. SLP facilitated session by providing magic cup to assess diet tolerance and maximize opportunity for po intake. Pt consumed an entire container without oral issues. Intermittent cough and throat clearing noted, however, this was seen prior to po trials. Pt was noted to talk almost incessantly during this session ("I don't know just what we were talking about, the state of [place]. I don't know that I have to. I'll have to do a little research to find out what I need to know. I don't know why it wouldn't be important to me). Pt oriented to name and day/month of birth. He was unable to verbalize birth year. Pt stated location as "[place]", and was unable to name [place]. Targeting attention, SLP asked pt to name another state, which required considerable encouragement and repeat instructions amidst pt verbal rambling. He eventually came up with "[place]", using it in sentence length language of confusion. Pt was pleasant and cooperative, and thanked me for being so nice to him.  Pt was left in bed in low position, bilateral mitts placed, all needs within reach. Continue ST per current plan of care.   Pain Pain Assessment Pain Scale: 0-10 Pain  Score: 0-No pain  Therapy/Group: Individual Therapy   Walter Larsen B. Quentin Ore, [facility], [place] Speech Language Pathologist  Shonna Chock 11/25/2018, 4:35 PM

## 2018-11-26 ENCOUNTER — Inpatient Hospital Stay (HOSPITAL_COMMUNITY): Payer: Medicare HMO

## 2018-11-26 ENCOUNTER — Inpatient Hospital Stay (HOSPITAL_COMMUNITY): Payer: Medicare HMO | Admitting: Occupational Therapy

## 2018-11-26 ENCOUNTER — Inpatient Hospital Stay (HOSPITAL_COMMUNITY): Payer: Medicare HMO | Admitting: Speech Pathology

## 2018-11-26 ENCOUNTER — Inpatient Hospital Stay (HOSPITAL_COMMUNITY): Payer: Medicare HMO | Admitting: Physical Therapy

## 2018-11-26 LAB — BASIC METABOLIC PANEL
Anion gap: 9 (ref 5–15)
BUN: 13 mg/dL (ref 8–23)
CO2: 25 mmol/L (ref 22–32)
Calcium: 8.6 mg/dL — ABNORMAL LOW (ref 8.9–10.3)
Chloride: 101 mmol/L (ref 98–111)
Creatinine, Ser: 0.83 mg/dL (ref 0.61–1.24)
GFR calc Af Amer: >60 mL/min (ref >60)
GFR calc non Af Amer: >60 mL/min (ref >60)
Glucose, Bld: 123 mg/dL — ABNORMAL HIGH (ref 70–99)
Potassium: 3.3 mmol/L — ABNORMAL LOW (ref 3.5–5.1)
Sodium: 135 mmol/L (ref 135–145)

## 2018-11-26 LAB — CBC
HCT: 45.2 % (ref 39.0–52.0)
Hemoglobin: 15.4 g/dL (ref 13.0–17.0)
MCH: 32.1 pg (ref 26.0–34.0)
MCHC: 34.1 g/dL (ref 30.0–36.0)
MCV: 94.2 fL (ref 80.0–100.0)
Platelets: 346 10*3/uL (ref 150–400)
RBC: 4.8 MIL/uL (ref 4.22–5.81)
RDW: 12.6 % (ref 11.5–15.5)
WBC: 14.7 10*3/uL — ABNORMAL HIGH (ref 4.0–10.5)
nRBC: 0 % (ref 0.0–0.2)

## 2018-11-26 MED ORDER — POTASSIUM CHLORIDE CRYS ER 20 MEQ PO TBCR
20.0000 meq | EXTENDED_RELEASE_TABLET | Freq: Two times a day (BID) | ORAL | Status: DC
  Administered 2018-11-26 – 2018-12-05 (×16): 20 meq via ORAL
  Filled 2018-11-26 (×20): qty 1

## 2018-11-26 NOTE — Procedures (Signed)
Patient Name: Walter Larsen  MRN: [id number]  Epilepsy Attending: Lora Havens  Referring Physician/Provider: Reesa Chew, PA Date: 11/26/2018 Duration: 27.11 minutes  Patient history: 83 year old male with multifocal hemorrhages.  EEG ordered to evaluate for seizures.  Level of alertness: Awake, asleep  Technical aspects: This EEG study was done with scalp electrodes positioned according to the 10-20 International system of electrode placement. Electrical activity was acquired at a sampling rate of 500Hz  and reviewed with a high frequency filter of 70Hz  and a low frequency filter of 1Hz . EEG data were recorded continuously and digitally stored.   BACKGROUND ACTIVITY:  The posterior dominant rhythm consists of 7 Hz activity of moderate voltage (25-35 uV) seen predominantly in posterior head regions, symmetric and reactive to eye opening and eye closing. Sleep was characterized by vertex waves and sleep spindles(12-14Hz ), max frontocentral. Continuous generalized 3 to 5 Hz theta-delta slowing was seen, maximal in left frontocentral region.  Hyperventilation and photic stimulation were not performed.  IMPRESSION: This study is suggestive of mild to moderate diffuse encephalopathy. No seizures or epileptiform discharges were seen throughout the recording.   [name]

## 2018-11-26 NOTE — Plan of Care (Signed)
  Problem: RH Stairs Goal: LTG Patient will ambulate up and down stairs w/assist (PT) Description: LTG: Patient will ambulate up and down # of stairs with assistance (PT) Outcome: Not Applicable Flowsheets (Taken 11/26/2018 1144) LTG: Pt will ambulate up/down stairs assist needed:: (discontinued 8/14 due to lack of progress) -- Note: Discontinued 8/14 due to lack of progress

## 2018-11-26 NOTE — Progress Notes (Signed)
Pt unavailable for EEG at this time. Pt will be going to MRI per nursing. Will attempt at a later or tomorrow when schedule permits

## 2018-11-26 NOTE — Progress Notes (Addendum)
Physical Therapy Weekly Progress Note  Patient Details  Name: Walter Larsen MRN: [id number] Date of Birth: January 08, 1936  Beginning of progress report period: November 20, 2018 End of progress report period: November 26, 2018  Today's Date: 11/26/2018 PT Individual Time: 8916-9450 AND 3888-2800 PT Individual Time Calculation (min): 40 min AND 25 min  Patient has met 1 of 4 short term goals. Pt has made minimal progress towards LTGs over last week 2/2 bouts of lethargy, poor awareness, and new onset of cortical blindness. Pt continues to require max-total assist for bed mobility, transfers, and +2 for gait via 3-musketeers. This is when he is alert enough to meaningfully participate. He requires max multimodal cues to initiate, attend to task, and for environmental awareness via tactile touch.   Patient continues to demonstrate the following deficits muscle weakness, decreased cardiorespiratoy endurance, impaired timing and sequencing, unbalanced muscle activation, motor apraxia, decreased coordination and decreased motor planning, decreased visual perceptual skills, ideational apraxia, decreased initiation, decreased attention, decreased awareness, decreased problem solving, decreased safety awareness, decreased memory and delayed processing and decreased sitting balance, decreased standing balance, decreased postural control and decreased balance strategies and therefore will continue to benefit from skilled PT intervention to increase functional independence with mobility.  Patient progressing toward long term goals..  Continue plan of care. Discontinued stair goal, anticipate pt will be primarily w/c level at d/c. LTGs of min assist remain appropriate.   PT Short Term Goals Week 1:  PT Short Term Goal 1 (Week 1): Pt will transfer bed<>chair w/ mod assist PT Short Term Goal 1 - Progress (Week 1): Not met PT Short Term Goal 2 (Week 1): Pt will initiate gait training PT Short Term Goal 2 - Progress  (Week 1): Met PT Short Term Goal 3 (Week 1): Pt will perform bed mobility w/ mod assist PT Short Term Goal 3 - Progress (Week 1): Not met PT Short Term Goal 4 (Week 1): Pt will initiate functional tasks w/ mod cues, 75% of the time PT Short Term Goal 4 - Progress (Week 1): Not met Week 2:  PT Short Term Goal 1 (Week 2): Pt will transfer bed<>chair w/ max assist x1 consistently PT Short Term Goal 2 (Week 2): Pt will initiate functional tasks w/ mod cues 50% of the time PT Short Term Goal 3 (Week 2): Pt will perform bed mobility w/ mod assist PT Short Term Goal 4 (Week 2): Pt will ambulate 10' w/ max assist x1  Skilled Therapeutic Interventions/Progress Updates:   Session 1:  Pt in supine, appears to be asleep but easily woken up and verbalizing to this therapist. Mostly automatic phrases including "that will be fine". Eyes remained closed throughout session, min tactile and verbal cues to stay awake. Supine>sit w/ max assist, however pt initiating more so this session. Maintained static sitting w/ CGA-min assist. Made multiple attempts at sit<>stands from EOB w/ max-total assist. Unable to reach full upright 2/2 posterior lean bias, although pt confirmed he was attempting to stand. Eventually able to perform squat/stand pivot w/ max assist when therapist placed pt's RUE on armrest of chair. Total assist w/c transport to/from therapy gym. Attempted to work on activating some awareness of tactile input of hands while grasping a beach ball. Pt unable despite total assist and pt falling asleep despite max cues to stay awake at this point. Pt continues to have heavy L cervical sidebending and flexion at rest. Adjusted pt's headrest to continue facilitating stretching into neutral cervical position. Ended session  in care of NT at nurses station, all needs met.   Session 2:  Pt awake and in supine, agreeable to make-up session, no c/o pain. Sat pt upright in chair position in bed and worked on environmental  awareness and attention to functional task. Provided pt w/ magic cup ice cream, total assist overall. Attempted R hand-over-hand, however pt unable to hold spoon despite total assist. Pt very perseverative w/ UE movements this session, grabbing onto covers and fidgeting. Pt only needed verbal cues to open mouth and swallow. Attempted to orient pt to hospital environment, unsuccessful and pt continued to decline knowing where he was. Pt only oriented to self. When asked what his wife's name was, pt stated "I think [name]". [name] is correct. Pt also unable to name his son nor where he lives/is from. Sat on pt's R side in attempts to facilitate increased R head turns while conversing with this therapist, pt would not cross midline but would look in direction of therapist's voice w/ eyes. Ended session in supine, all needs in reach. EEG tech present.   Therapy Documentation Precautions:  Precautions Precautions: Fall Precaution Comments: cortical blindness Restrictions Weight Bearing Restrictions: No Vital Signs: Therapy Vitals Temp: 98.2 F (36.8 C) Pulse Rate: 72 Resp: 18 BP: 128/89 Patient Position (if appropriate): Lying Oxygen Therapy O2 Device: Room Air  Therapy/Group: Individual Therapy  Hiedi Touchton Clent Demark 11/26/2018, 9:53 AM

## 2018-11-26 NOTE — Progress Notes (Signed)
Occupational Therapy Weekly Progress Note  Patient Details  Name: Walter Larsen MRN: [id number] Date of Birth: [birth year]  Beginning of progress report period: November 20, 2018 End of progress report period: November 26, 2018  Today's Date: 11/26/2018 OT Individual Time: 1345-1440 OT Individual Time Calculation (min): 55 min   Pt has made very slow progress towards OT goals this week. At the beginning of the week, pt was more alert and following more commands. He could even complete stand-pivot transfers at times with mod A. Towards the end of the week, pt has been very lethargic and has had difficulty participating in therapy. Pt has not been able to participate functionally in any of his self-care tasks and has needed total A for transfers. New short term goals have been set. Will continue to work towards [employer] A goals at this time.  Patient continues to demonstrate the following deficits: muscle weakness, decreased cardiorespiratoy endurance, impaired timing and sequencing, unbalanced muscle activation, motor apraxia, ataxia, decreased coordination and decreased motor planning, decreased visual perceptual skills, decreased midline orientation, left side neglect, decreased motor planning and ideational apraxia, decreased initiation, decreased attention, decreased awareness, decreased problem solving, decreased safety awareness, decreased memory and delayed processing and decreased sitting balance, decreased standing balance, decreased postural control, hemiplegia, decreased balance strategies and difficulty maintaining precautions and therefore will continue to benefit from skilled OT intervention to enhance overall performance with Reduce care partner burden.  Patient progressing toward long term goals..  Continue plan of care.  OT Short Term Goals Week 2:  OT Short Term Goal 1 (Week 2): Pt will complete 1 step of UB dressing task with max multimodal cues OT Short Term Goal 2 (Week 2): Pt will  tolerate sitting EOB for 5 mins in preparation for BADL task OT Short Term Goal 3 (Week 2): Pt will complete functional transfer with LRAD and Max A of 1 person consistently  Skilled Therapeutic Interventions/Progress Updates:    Pt greeted asleep at nurses station in [place] wc. Per discussion with NT, but would not swallow his food or drink today and had to be suctioned to get food out of his mouth after he would not swallow. Nurse tech has experience with patient and says she can usually get him to eat. Pt brought to therapy gym in [facility] wc and set-up at standing frame. OT attempted sit<>stand in standing frame multiple times, but pt unable to safely achieve trunk extension with only 1 person assist. Pt was more awake after standing attempts. Pt brought to [employer] arm bike and OT placed B UEs onto handles. Pt able to maintain grasp on R handle, but needed OT assist to maintain grasp on L. Pt with some understanding of reciprocal motion of arm bike and initiated push/pull motion. Pt able to completed 3, 1 minute intervals with hand over hand A. Pt mumbling "I did too much, I think I did too much." throughout session. Pt brought back to the room and given wash cloth to wash face. Pt needed hand over hand and total A to complete functional task. OT provided max multimodal cues and multiple trials to get pt to power up enough to transfer. Pt pushing back on therapist and not aiding in transfer at all. OT re-assured pt to where we were going and provided hand over hand to L hand to feel the bed as a low vision strategy. Pt continued to push back on therapist and needed Total A for transfer. Total A to then return to bed  as he would not initiate bed mobility on his own despite max multimodal cues and more than reasonable amount of time. Once back in the bed, pt restless, pulling gown overhead and stating "they took too much money, I told them it was too much money." OT redirected pt to place, time, situation. OT handed pt  cold rag and tried to get him to wash his face again. Pt able to bring rag to face, but would not thoroughly wash face. Pt left semi-reclined in bed with bed alarm on and needs met.  Therapy Documentation Precautions:  Precautions Precautions: Fall Precaution Comments: cortical blindness Restrictions Weight Bearing Restrictions: No Pain: Pain Assessment Pain Scale: 0-10 Pain Score: 0-No pain Faces Pain Scale: No hurt   Therapy/Group: Individual Therapy  Valma Cava 11/26/2018, 12:59 PM

## 2018-11-26 NOTE — Plan of Care (Signed)
  Problem: RH BOWEL ELIMINATION Goal: RH STG MANAGE BOWEL WITH ASSISTANCE Description: STG Manage Bowel with Assistance. Mod Outcome: Progressing   Problem: RH BLADDER ELIMINATION Goal: RH STG MANAGE BLADDER WITH ASSISTANCE Description: STG Manage Bladder With Assistance. Mod Outcome: Progressing   Problem: RH SKIN INTEGRITY Goal: RH STG MAINTAIN SKIN INTEGRITY WITH ASSISTANCE Description: STG Maintain Skin Integrity With Assistance. Mod Outcome: Progressing   Problem: RH SAFETY Goal: RH STG ADHERE TO SAFETY PRECAUTIONS W/ASSISTANCE/DEVICE Description: STG Adhere to Safety Precautions With Assistance/Device. Max Outcome: Progressing   Problem: RH COGNITION-NURSING Goal: RH STG USES MEMORY AIDS/STRATEGIES W/ASSIST TO PROBLEM SOLVE Description: STG Uses Memory Aids/Strategies With Assistance to Problem Solve. Mod Outcome: Progressing   Problem: RH Vision Goal: RH LTG Vision (Specify) Outcome: Progressing   Problem: Consults Goal: RH BRAIN INJURY PATIENT EDUCATION Description: Description: See Patient Education module for eduction specifics Outcome: Progressing

## 2018-11-26 NOTE — Progress Notes (Addendum)
Discussed patient with Dr. Erlinda Hong regarding patient's dysphagia.  MRI brain ordered but he recommended EEG also as patient high risk for seizures.  Infectious work up negative. IF MRI/EEG negative ---he felt that ongoing issues may be due to encephalopathy secondary to significant bleed.     Additionally will go ahead and stop scheduled HS seroquel again even though he slept better last night.   Meredith Staggers, MD, [facility] 11/26/2018

## 2018-11-26 NOTE — Progress Notes (Signed)
Speech Language Pathology Weekly Progress and Session Note  Patient Details  Name: Walter Larsen MRN: [id number] Date of Birth: [birth year]  Beginning of progress report period: November 19, 2018 End of progress report period: November 26, 2018  Today's Date: 11/26/2018 SLP Individual Time: 1130-1150 SLP Individual Time Calculation (min): 20 min  Short Term Goals: Week 1: SLP Short Term Goal 1 (Week 1): Pt will sustain attention to basic familiar task for ~ 1 minute with Max A multimodal cues. SLP Short Term Goal 1 - Progress (Week 1): Not met SLP Short Term Goal 2 (Week 1): Pt will follow 1 step basic directions in 2 out of 10 opportunities with Max A multimodal cues. SLP Short Term Goal 2 - Progress (Week 1): Not met SLP Short Term Goal 3 (Week 1): Pt will consume current diet with minimal overt s/s of aspiration and Mod A cues for use of compensatory swallow strategies. SLP Short Term Goal 3 - Progress (Week 1): Not met    New Short Term Goals: Week 2: SLP Short Term Goal 1 (Week 2): Pt will consume current diet with minimal overt s/s of aspiration and Mod A cues for use of compensatory swallow strategies. SLP Short Term Goal 2 (Week 2): Pt will follow 1 step basic directions in 2 out of 10 opportunities with Max A multimodal cues. SLP Short Term Goal 3 (Week 2): Pt will sustain attention to basic familiar task for ~ 1 minute with Max A multimodal cues.  Weekly Progress Updates: Patient has made minimal gains and has not many STGs this reporting period. Currently, patient's biggest barrier to progress is fatigue and lethargy resulting in patient's inability to effectively participate in treatment sessions. Patient keeps his eyes closed throughout sessions and demonstrates consistent language of confusion requiring total A to complete any basic and functional task safely in regards to initiation and attention. Patient is consuming Dys. 1 textures with thin liquids with minimal overt s/s of  aspiration but requires Max A verbal cues for use of swallowing strategies. Patient and family education ongoing. Patient would benefit from continued skilled SLP intervention to maximize his swallowing and cognitive functioning prior to discharge.      Intensity: Minumum of 1-2 x/day, 30 to 90 minutes Frequency: 3 to 5 out of 7 days Duration/Length of Stay: 3 weeks Treatment/Interventions: Cognitive remediation/compensation;Environmental controls;Patient/family education;Internal/external aids;Dysphagia/aspiration precaution training;Cueing hierarchy;Functional tasks;Therapeutic Activities   Daily Session  Skilled Therapeutic Interventions: Skilled treatment session focused on cognitive goals. Upon arrival, patient was asleep in the wheelchair and difficult to arouse.  Patient never opened his eyes during the session depsite Max A multimodal cues but did verbalize intermittently. Verbalizations could be characterized by language of confusion with decreased intelligibility. Despite hand over hand assist patient did not initiate any task of follow any basic 1-step commands. Due to fatigue and inability to effectively participate in treatment session, patient missed the remaining 10 minutes of session. Patient left upright in wheelchair with alarm on at RN station. Continue with current plan of care.     Pain No/Denies Pain   Therapy/Group: Individual Therapy  Chriselda Leppert 11/26/2018, 6:29 AM

## 2018-11-26 NOTE — Progress Notes (Signed)
[facility] PHYSICAL MEDICINE & REHABILITATION PROGRESS NOTE   Subjective/Complaints: Appears to have slept better by sleep chart. 6-8 hours. No issues this morning  ROS: Limited due to cognitive/behavioral    Objective:   No results found. Recent Labs    11/24/18 0552 11/26/18 0625  WBC 13.9* 14.7*  HGB 16.8 15.4  HCT 50.0 45.2  PLT 264 346   Recent Labs    11/23/18 1053 11/26/18 0625  NA 137 135  K 4.1 3.3*  CL 102 101  CO2 22 25  GLUCOSE 138* 123*  BUN 14 13  CREATININE 0.90 0.83  CALCIUM 9.8 8.6*    Intake/Output Summary (Last 24 hours) at 11/26/2018 1029 Last data filed at 11/26/2018 0602 Gross per 24 hour  Intake 1400 ml  Output 2343 ml  Net -943 ml     Physical Exam: Vital Signs Blood pressure 128/89, pulse 72, temperature 98.2 F (36.8 C), resp. rate 18, weight 83.9 kg, SpO2 97 %. Constitutional: No distress . Vital signs reviewed. HEENT: EOMI, oral membranes moist Neck: supple Cardiovascular: RRR without murmur. No JVD    Respiratory: CTA Bilaterally without wheezes or rales. Normal effort    GI: BS +, non-tender, non-distended  Skin: Warm and dry.  Intact. Psych: Normal mood.  Normal behavior. Musc: No edema.  No tenderness. Neurological: Keeps eyes closed. Follows commands more consistently. Oriented to self. Moves all 4's. Skin: Skin iswarmand dry.  Psychiatric:more alert    Assessment/Plan: 1. Functional deficits secondary to bilateral posterior circulation infarcts which require 3+ hours per day of interdisciplinary therapy in a comprehensive inpatient rehab setting.  Physiatrist is providing close team supervision and 24 hour management of active medical problems listed below.  Physiatrist and rehab team continue to assess barriers to discharge/monitor patient progress toward functional and medical goals  Care Tool:  Bathing    Body parts bathed by patient: Face   Body parts bathed by helper: Front perineal area, Buttocks,  Right upper leg, Left upper leg, Left lower leg, Right lower leg     Bathing assist Assist Level: Maximal Assistance - Patient 24 - 49%     Upper Body Dressing/Undressing Upper body dressing   What is the patient wearing?: Pull over shirt    Upper body assist Assist Level: Total Assistance - Patient < 25%    Lower Body Dressing/Undressing Lower body dressing      What is the patient wearing?: Pants     Lower body assist Assist for lower body dressing: Total Assistance - Patient < 25%     Toileting Toileting    Toileting assist Assist for toileting: Total Assistance - Patient < 25%     Transfers Chair/bed transfer  Transfers assist     Chair/bed transfer assist level: Maximal Assistance - Patient 25 - 49%     Locomotion Ambulation   Ambulation assist   Ambulation activity did not occur: Safety/medical concerns  Assist level: 2 helpers Assistive device: No Device Max distance: 15'   Walk 10 feet activity   Assist  Walk 10 feet activity did not occur: Safety/medical concerns  Assist level: 2 helpers     Walk 50 feet activity   Assist Walk 50 feet with 2 turns activity did not occur: Safety/medical concerns         Walk 150 feet activity   Assist Walk 150 feet activity did not occur: Safety/medical concerns         Walk 10 feet on uneven surface  activity  Assist Walk 10 feet on uneven surfaces activity did not occur: Safety/medical concerns         Wheelchair     Assist Will patient use wheelchair at discharge?: No   Wheelchair activity did not occur: N/A         Wheelchair 50 feet with 2 turns activity    Assist    Wheelchair 50 feet with 2 turns activity did not occur: N/A       Wheelchair 150 feet activity     Assist Wheelchair 150 feet activity did not occur: N/A        Medical Problem List and Plan: 1.Functional deficitssecondary to [place] with cortical blindness and confusion  Continue CIR  therapies as tolerated 2. Antithrombotics: -DVT/anticoagulation:Mechanical:Sequential compression devices, below kneeBilateral lower extremities -antiplatelet therapy: N/A 3. Pain Management:N/A 4. Mood:LCSW to follow for evaluation and support when appropriate -antipsychotic agents: continue scheduled seroquel at HS  50mg   to help with sleep/wake cycle 5. Neuropsych: This patientis notcapable of making decisions on hisown behalf. 6. Skin/Wound Care:Routine pressure relief measures 7. Fluids/Electrolytes/Nutrition: continue to push po, supervision with meals  -supplements between meals 8.JJO:ACZYSAYT Lopressor.    Amlodipine increased to 7.5 on 8/12  9.Agitation: Continue Depakote every 8 hours.  -seroquel as above 10.GERD: On Protonix 11.Leukocytosis: Monitor for signs of infection.  WBCs 13.9 on 8/12---14.7today  -in reviewing chart, leukocytosis is chronic with neutrophilia---consider outpt h/o follow up. Don't think it's relevant at present 12. Urinary retention: Continue Flomax.   -UA negative, UCX negative x 2.   - Monitor voiding with bladder scan and cath for volumes > 350 cc  -OOB to void when possible 13. Lethargy: suspect this was due to depakote and altered sleep-wake cycle in setting of CVA  -CXR and CT without acute changes  -ucx negative x 2  empiric ceftriaxone started---dc today  -stopped vpa  -supportive measures  - continue IVF hs  -resumed 50mg  seroquel last night with improvement of sleep pattern- more awake today   LOS: 7 days A FACE TO FACE EVALUATION WAS PERFORMED  Meredith Staggers 11/26/2018, 10:29 AM

## 2018-11-27 ENCOUNTER — Inpatient Hospital Stay (HOSPITAL_COMMUNITY): Payer: Medicare HMO

## 2018-11-27 ENCOUNTER — Inpatient Hospital Stay (HOSPITAL_COMMUNITY): Payer: Medicare HMO | Admitting: Physical Therapy

## 2018-11-27 DIAGNOSIS — I611 Nontraumatic intracerebral hemorrhage in hemisphere, cortical: Secondary | ICD-10-CM

## 2018-11-27 NOTE — Progress Notes (Signed)
Patient with decreased arousal throughout the day. Patient follows commands at times, moves all 4 extremities, and answers simple questions at times. Patient able to take medications crushed this morning with max cueing to swallow. IV fluids running. Continue to monitor.

## 2018-11-27 NOTE — Progress Notes (Signed)
Physical Therapy Session Note  Patient Details  Name: Walter Larsen MRN: [id number] Date of Birth: [birth year]  Today's Date: 11/27/2018 PT Individual Time: 0800-0900 PT Individual Time Calculation (min): 60 min   Short Term Goals: Week 1:  PT Short Term Goal 1 (Week 1): Pt will transfer bed<>chair w/ mod assist PT Short Term Goal 1 - Progress (Week 1): Not met PT Short Term Goal 2 (Week 1): Pt will initiate gait training PT Short Term Goal 2 - Progress (Week 1): Met PT Short Term Goal 3 (Week 1): Pt will perform bed mobility w/ mod assist PT Short Term Goal 3 - Progress (Week 1): Not met PT Short Term Goal 4 (Week 1): Pt will initiate functional tasks w/ mod cues, 75% of the time PT Short Term Goal 4 - Progress (Week 1): Not met  Skilled Therapeutic Interventions/Progress Updates:  Pt was seen bedside in the am. Pt sleeping but arouses to stimuli. Pt perseverating throughout treatment session on food and needing to get something cooked. Pt required frequent redirection and verbal cues to focus on task at hand. Pt rolled R/L with side rail and verbal cues with max A and increased time to assist with donning pants. Pt transferred supine to edge of bed with max A and verbal cues. Pt tolerated edge of bed about 10 minutes with min to max  A and verbal cues. Pt transferred edge of bed to tilt in space w/c with max A and verbal cues. Pt left sitting up in w/c with chair alarm on and nurse tech at bedside.   Therapy Documentation Precautions:  Precautions Precautions: Fall Precaution Comments: cortical blindness Restrictions Weight Bearing Restrictions: No General:   Vital Signs: Therapy Vitals BP: (!) 190/103(pt agitated, restless) Pain: Pain Assessment Pain Scale: 0-10 Pain Score: 0-No pain  Therapy/Group: Individual Therapy  Dub Amis 11/27/2018, 12:05 PM

## 2018-11-27 NOTE — Plan of Care (Signed)
  Problem: RH SKIN INTEGRITY Goal: RH STG MAINTAIN SKIN INTEGRITY WITH ASSISTANCE Description: STG Maintain Skin Integrity With Assistance. Mod Outcome: Progressing   Problem: RH Vision Goal: RH LTG Vision (Specify) Outcome: Progressing   Problem: Consults Goal: RH BRAIN INJURY PATIENT EDUCATION Description: Description: See Patient Education module for eduction specifics Outcome: Progressing   Problem: RH BOWEL ELIMINATION Goal: RH STG MANAGE BOWEL WITH ASSISTANCE Description: STG Manage Bowel with Assistance. Mod Outcome: Not Progressing   Problem: RH BLADDER ELIMINATION Goal: RH STG MANAGE BLADDER WITH ASSISTANCE Description: STG Manage Bladder With Assistance. Mod Outcome: Not Progressing   Problem: RH SAFETY Goal: RH STG ADHERE TO SAFETY PRECAUTIONS W/ASSISTANCE/DEVICE Description: STG Adhere to Safety Precautions With Assistance/Device. Max Outcome: Not Progressing   Problem: RH COGNITION-NURSING Goal: RH STG USES MEMORY AIDS/STRATEGIES W/ASSIST TO PROBLEM SOLVE Description: STG Uses Memory Aids/Strategies With Assistance to Problem Solve. Mod Outcome: Not Progressing

## 2018-11-27 NOTE — Progress Notes (Signed)
[facility] PHYSICAL MEDICINE & REHABILITATION PROGRESS NOTE   Subjective/Complaints: EEG neg MRI no acute infarct , technically limited study Oriented to self, mumbles "I don't want another dog" ROS: Limited due to cognitive/behavioral    Objective:   Mr Brain Wo Contrast  Result Date: 11/26/2018 CLINICAL DATA:  Dysphagia. EXAM: MRI HEAD WITHOUT CONTRAST TECHNIQUE: Multiplanar, multiecho pulse sequences of the brain and surrounding structures were obtained without intravenous contrast. COMPARISON:  Head CT 11/23/2018 and MRI 11/15/2018 FINDINGS: The study is severely motion degraded. Brain: Multiple subacute parenchymal hemorrhages are again seen posteriorly in both cerebral hemispheres. The largest measures approximately 7 cm in the posterior right temporal lobe, and there are hemorrhages in both occipital lobes including a 3.9 cm hematoma on the left. There is mild edema associated with these hemorrhages which is similar to the prior CT. There is associated regional sulcal effacement and mass effect on the right temporal horn without midline shift. A thin subdural hematoma over the left cerebral convexity measures up to 2 mm in thickness in the posterior temporal region without mass effect not well demonstrated on the prior CT due to its very small size. Numerous chronic bilateral cerebral and cerebellar hemorrhages were better demonstrated on the susceptibility weighted sequence on the prior MRI. The punctate foci of trace diffusion signal abnormality on the prior MRI in the left superior frontal gyrus and left cerebellum are no longer apparent. There is diffusion abnormality associated with the hemorrhages on today's study without definite acute infarct seen separately. Extensive encephalomalacia is again noted in the right occipital lobe, and there is age advanced chronic small vessel ischemia in the cerebral white matter. There is central predominant cerebral atrophy. Vascular: Major  intracranial vascular flow voids are grossly preserved. Skull and upper cervical spine: No destructive skull lesion. Sinuses/Orbits: Bilateral cataract extraction. No significant inflammatory sinus disease is evident. Other: None. IMPRESSION: 1. Severely motion degraded examination. 2. Subacute bilateral cerebral hematomas as previously seen. Similar edema and local mass effect without midline shift. 3. Thin subdural hematoma over the left cerebral convexity without mass effect. 4. No definite acute infarct identified. Electronically Signed   By: Logan Bores M.D.   On: 11/26/2018 16:24   Recent Labs    11/26/18 0625  WBC 14.7*  HGB 15.4  HCT 45.2  PLT 346   Recent Labs    11/26/18 0625  NA 135  K 3.3*  CL 101  CO2 25  GLUCOSE 123*  BUN 13  CREATININE 0.83  CALCIUM 8.6*    Intake/Output Summary (Last 24 hours) at 11/27/2018 1323 Last data filed at 11/27/2018 1200 Gross per 24 hour  Intake 2002.46 ml  Output 2275 ml  Net -272.54 ml     Physical Exam: Vital Signs Blood pressure (!) 158/83, pulse 79, temperature 97.6 F (36.4 C), resp. rate 18, weight 83.9 kg, SpO2 98 %. Constitutional: No distress . Vital signs reviewed. HEENT: EOMI, oral membranes moist Neck: supple Cardiovascular: RRR without murmur. No JVD    Respiratory: CTA Bilaterally without wheezes or rales. Normal effort    GI: BS +, non-tender, non-distended  Skin: Warm and dry.  Intact. Psych: Normal mood.  Normal behavior. Musc: No edema.  No tenderness. Neurological: . Follows commands inconsistently  Oriented to self. Moves all 4's.Cannot participate in MMT Skin: Skin iswarmand dry.  Psychiatric:more alert    Assessment/Plan: 1. Functional deficits secondary to bilateral posterior circulation infarcts which require 3+ hours per day of interdisciplinary therapy in a comprehensive inpatient rehab  setting.  Physiatrist is providing close team supervision and 24 hour management of active medical  problems listed below.  Physiatrist and rehab team continue to assess barriers to discharge/monitor patient progress toward functional and medical goals  Care Tool:  Bathing    Body parts bathed by patient: Face   Body parts bathed by helper: Front perineal area, Buttocks, Right upper leg, Left upper leg, Left lower leg, Right lower leg     Bathing assist Assist Level: Maximal Assistance - Patient 24 - 49%     Upper Body Dressing/Undressing Upper body dressing   What is the patient wearing?: Pull over shirt    Upper body assist Assist Level: Total Assistance - Patient < 25%    Lower Body Dressing/Undressing Lower body dressing      What is the patient wearing?: Pants     Lower body assist Assist for lower body dressing: Total Assistance - Patient < 25%     Toileting Toileting    Toileting assist Assist for toileting: Total Assistance - Patient < 25%     Transfers Chair/bed transfer  Transfers assist     Chair/bed transfer assist level: Maximal Assistance - Patient 25 - 49%     Locomotion Ambulation   Ambulation assist   Ambulation activity did not occur: Safety/medical concerns  Assist level: 2 helpers Assistive device: No Device Max distance: 15'   Walk 10 feet activity   Assist  Walk 10 feet activity did not occur: Safety/medical concerns  Assist level: 2 helpers     Walk 50 feet activity   Assist Walk 50 feet with 2 turns activity did not occur: Safety/medical concerns         Walk 150 feet activity   Assist Walk 150 feet activity did not occur: Safety/medical concerns         Walk 10 feet on uneven surface  activity   Assist Walk 10 feet on uneven surfaces activity did not occur: Safety/medical concerns         Wheelchair     Assist Will patient use wheelchair at discharge?: No   Wheelchair activity did not occur: N/A         Wheelchair 50 feet with 2 turns activity    Assist    Wheelchair 50 feet  with 2 turns activity did not occur: N/A       Wheelchair 150 feet activity     Assist Wheelchair 150 feet activity did not occur: N/A        Medical Problem List and Plan: 1.Functional deficitssecondary to [place] with cortical blindness and confusion  Continue CIR therapies as tolerated, 15/7 2. Antithrombotics: -DVT/anticoagulation:Mechanical:Sequential compression devices, below kneeBilateral lower extremities -antiplatelet therapy: N/A 3. Pain Management:N/A 4. Mood:LCSW to follow for evaluation and support when appropriate -antipsychotic agents: continue scheduled seroquel at HS  50mg   to help with sleep/wake cycle 5. Neuropsych: This patientis notcapable of making decisions on hisown behalf. 6. Skin/Wound Care:Routine pressure relief measures 7. Fluids/Electrolytes/Nutrition: continue to push po, supervision with meals  -supplements between meals 8.ZJI:RCVELFYB Lopressor.    Amlodipine increased to 7.5 on 8/12  Vitals:   11/27/18 0821 11/27/18 1215  BP: (!) 190/103 (!) 158/83  Pulse:  79  Resp:    Temp:    SpO2:    Elevated increase amlodipine to 10mg   9.Agitation: Continue Depakote every 8 hours.  -seroquel as above 10.GERD: On Protonix 11.Leukocytosis: Monitor for signs of infection.  WBCs 13.9 on 8/12---14.7 8/14  -in reviewing chart,  leukocytosis is chronic with neutrophilia---consider outpt h/o follow up. Don't think it's relevant at present 12. Urinary retention: Continue Flomax.   -UA negative, UCX negative x 2.   - Monitor voiding with bladder scan and cath for volumes > 350 cc  -OOB to void when possible 13. Lethargy: suspect this was due to depakote and altered sleep-wake cycle in setting of CVA  -CXR and CT without acute changes  -ucx negative x 2  empiric ceftriaxone started---dc today  -stopped vpa  -supportive measures  - continue IVF hs  -resumed 50mg  seroquel  LOS: 8 days A FACE TO FACE  EVALUATION WAS PERFORMED  Charlett Blake 11/27/2018, 1:23 PM

## 2018-11-27 NOTE — Progress Notes (Signed)
Occupational Therapy Session Note  Patient Details  Name: Walter Larsen MRN: [id number] Date of Birth: 18-Mar-1936  Today's Date: 11/27/2018 OT Individual Time: 6712-4580 OT Individual Time Calculation (min): 69 min    Short Term Goals: Week 1:     Skilled Therapeutic Interventions/Progress Updates:    1;1.Pt received at RN station mumbling but agreeable to tx. Pt with no pain when questioned. Pt murmuring phrases like, "youre such a blessing" and ""I just need to rest." Pt unable to attend to task and often fights HOH A. Pt bathes and dresses UB with MAX-Total A. Pt able to continue motion 3/5 body parts if OT able to get started with HOH A. OT washes hair and pt able to comb hair with MAX VC and MOD HOH A. Fter bathing and dressing pt demo decreased arousal requiring MAX A to open mouth for suction oral care. Pt requires MAX HOH A to complete oral care. Brought pt to table top with wash cloth to wipe off table for AROM, NMR and attention to repetitive motion task. Pt requires MAX-TOTAL HOH A to completes 2x10 circles, shoulder  Flex/ext and horizontal ab/adduction at table top seated with cuing for attention with BUE. Pt returned to room and returned to bed using MAXI MOVE d/t  Decreased arousal making it unsafe to attempt to stand pivot. Exited session with RN and NT in room scanning bladder  Therapy Documentation Precautions:  Precautions Precautions: Fall Precaution Comments: cortical blindness Restrictions Weight Bearing Restrictions: No General:   Vital Signs: Therapy Vitals BP: (!) 190/103(pt agitated, restless) Pain: Pain Assessment Pain Scale: 0-10 Pain Score: 0-No pain ADL: ADL Eating: Maximal assistance(hand over hand to hold cup and bring to mouth) Where Assessed-Eating: Wheelchair Grooming: Dependent Where Assessed-Grooming: Sitting at sink Upper Body Bathing: Minimal cueing, Minimal assistance Where Assessed-Upper Body Bathing: Sitting at sink Lower Body Bathing:  Maximal assistance, Moderate assistance Where Assessed-Lower Body Bathing: Sitting at sink, Standing at sink Upper Body Dressing: Dependent Where Assessed-Upper Body Dressing: Sitting at sink Lower Body Dressing: Dependent Where Assessed-Lower Body Dressing: Sitting at sink, Standing at sink Toilet Transfer: Maximal assistance Toilet Transfer Method: Squat pivot Toilet Transfer Equipment: Bedside commode Vision   Perception    Praxis   Exercises:   Other Treatments:     Therapy/Group: Individual Therapy  Tonny Branch 11/27/2018, 11:12 AM

## 2018-11-28 ENCOUNTER — Inpatient Hospital Stay (HOSPITAL_COMMUNITY): Payer: Medicare HMO | Admitting: Occupational Therapy

## 2018-11-28 ENCOUNTER — Inpatient Hospital Stay (HOSPITAL_COMMUNITY): Payer: Medicare HMO

## 2018-11-28 NOTE — Progress Notes (Signed)
Occupational Therapy Session Note  Patient Details  Name: Walter Larsen MRN: [id number] Date of Birth: Apr 05, 1936  Today's Date: 11/28/2018 OT Individual Time: 1420-1503 OT Individual Time Calculation (min): 43 min  32 minutes missed   Skilled Therapeutic Interventions/Progress Updates:    Pt greeted in TIS at RN station. He was verbose and muttering throughout session, however the words were incomprehensible. Escorted pt back to his room. He was resistant to [facility] during hand washing and face washing at the sink. Therefore OT completed these tasks Total A. Attempted to have him eat lunch. Once again, resistant to [facility] when provided with utensil. Also resistant to presentation of food or beverage near mouth. Transitioned to therapeutic activity in the dayroom. Played familiar songs of various genres to elicit any therapeutic response. Encouraged singing and tapping along to beat of music using his Lt hand. However pt unable to maintain attention to engage in either task. Total A HOH for participation. Pt was returned to RN station. Time missed due to pt fatigue.    Therapy Documentation Precautions:  Precautions Precautions: Fall Precaution Comments: cortical blindness Restrictions Weight Bearing Restrictions: No Vital Signs: Therapy Vitals Temp: 98.7 F (37.1 C) Pulse Rate: 86 Resp: 17 BP: 115/84 Patient Position (if appropriate): Sitting Oxygen Therapy SpO2: 96 % O2 Device: Room Air Pain: No s/s pain during tx   ADL: ADL Eating: Maximal assistance(hand over hand to hold cup and bring to mouth) Where Assessed-Eating: Wheelchair Grooming: Dependent Where Assessed-Grooming: Sitting at sink Upper Body Bathing: Minimal cueing, Minimal assistance Where Assessed-Upper Body Bathing: Sitting at sink Lower Body Bathing: Maximal assistance, Moderate assistance Where Assessed-Lower Body Bathing: Sitting at sink, Standing at sink Upper Body Dressing: Dependent Where Assessed-Upper Body  Dressing: Sitting at sink Lower Body Dressing: Dependent Where Assessed-Lower Body Dressing: Sitting at sink, Standing at sink Toilet Transfer: Maximal assistance Toilet Transfer Method: Squat pivot Toilet Transfer Equipment: Bedside commode :     Therapy/Group: Individual Therapy  Antuan Limes A Susen Haskew 11/28/2018, 3:37 PM

## 2018-11-28 NOTE — Progress Notes (Signed)
[place] PHYSICAL MEDICINE & REHABILITATION PROGRESS NOTE   Subjective/Complaints: Sleeping at nursing station awakens to voice. ROS: Limited due to cognitive/behavioral    Objective:   Mr Brain Wo Contrast  Result Date: 11/26/2018 CLINICAL DATA:  Dysphagia. EXAM: MRI HEAD WITHOUT CONTRAST TECHNIQUE: Multiplanar, multiecho pulse sequences of the brain and surrounding structures were obtained without intravenous contrast. COMPARISON:  Head CT 11/23/2018 and MRI 11/15/2018 FINDINGS: The study is severely motion degraded. Brain: Multiple subacute parenchymal hemorrhages are again seen posteriorly in both cerebral hemispheres. The largest measures approximately 7 cm in the posterior right temporal lobe, and there are hemorrhages in both occipital lobes including a 3.9 cm hematoma on the left. There is mild edema associated with these hemorrhages which is similar to the prior CT. There is associated regional sulcal effacement and mass effect on the right temporal horn without midline shift. A thin subdural hematoma over the left cerebral convexity measures up to 2 mm in thickness in the posterior temporal region without mass effect not well demonstrated on the prior CT due to its very small size. Numerous chronic bilateral cerebral and cerebellar hemorrhages were better demonstrated on the susceptibility weighted sequence on the prior MRI. The punctate foci of trace diffusion signal abnormality on the prior MRI in the left superior frontal gyrus and left cerebellum are no longer apparent. There is diffusion abnormality associated with the hemorrhages on today's study without definite acute infarct seen separately. Extensive encephalomalacia is again noted in the right occipital lobe, and there is age advanced chronic small vessel ischemia in the cerebral white matter. There is central predominant cerebral atrophy. Vascular: Major intracranial vascular flow voids are grossly preserved. Skull and upper  cervical spine: No destructive skull lesion. Sinuses/Orbits: Bilateral cataract extraction. No significant inflammatory sinus disease is evident. Other: None. IMPRESSION: 1. Severely motion degraded examination. 2. Subacute bilateral cerebral hematomas as previously seen. Similar edema and local mass effect without midline shift. 3. Thin subdural hematoma over the left cerebral convexity without mass effect. 4. No definite acute infarct identified. Electronically Signed   By: Logan Bores M.D.   On: 11/26/2018 16:24   Recent Labs    11/26/18 0625  WBC 14.7*  HGB 15.4  HCT 45.2  PLT 346   Recent Labs    11/26/18 0625  NA 135  K 3.3*  CL 101  CO2 25  GLUCOSE 123*  BUN 13  CREATININE 0.83  CALCIUM 8.6*    Intake/Output Summary (Last 24 hours) at 11/28/2018 1335 Last data filed at 11/28/2018 1030 Gross per 24 hour  Intake 2124.7 ml  Output 2250 ml  Net -125.3 ml     Physical Exam: Vital Signs Blood pressure (!) 175/88, pulse 80, temperature 98 F (36.7 C), temperature source Oral, resp. rate 16, weight 83.9 kg, SpO2 100 %. Constitutional: No distress . Vital signs reviewed. HEENT: EOMI, oral membranes moist Neck: supple Cardiovascular: RRR without murmur. No JVD    Respiratory: CTA Bilaterally without wheezes or rales. Normal effort    GI: BS +, non-tender, non-distended  Skin: Warm and dry.  Intact. Psych: Normal mood.  Normal behavior. Musc: No edema.  No tenderness. Neurological:  Follows commands with upper extremities but not with lower extremities. Skin: Skin iswarmand dry.  Psychiatric:Lethargic    Assessment/Plan: 1. Functional deficits secondary to bilateral posterior circulation infarcts which require 3+ hours per day of interdisciplinary therapy in a comprehensive inpatient rehab setting.  Physiatrist is providing close team supervision and 24 hour management of  active medical problems listed below.  Physiatrist and rehab team continue to assess  barriers to discharge/monitor patient progress toward functional and medical goals  Care Tool:  Bathing    Body parts bathed by patient: Face   Body parts bathed by helper: Front perineal area, Buttocks, Right upper leg, Left upper leg, Left lower leg, Right lower leg     Bathing assist Assist Level: Maximal Assistance - Patient 24 - 49%     Upper Body Dressing/Undressing Upper body dressing   What is the patient wearing?: Pull over shirt    Upper body assist Assist Level: Total Assistance - Patient < 25%    Lower Body Dressing/Undressing Lower body dressing      What is the patient wearing?: Pants     Lower body assist Assist for lower body dressing: Total Assistance - Patient < 25%     Toileting Toileting    Toileting assist Assist for toileting: Total Assistance - Patient < 25%     Transfers Chair/bed transfer  Transfers assist     Chair/bed transfer assist level: 2 Helpers     Locomotion Ambulation   Ambulation assist   Ambulation activity did not occur: Safety/medical concerns  Assist level: 2 helpers Assistive device: No Device Max distance: 15'   Walk 10 feet activity   Assist  Walk 10 feet activity did not occur: Safety/medical concerns  Assist level: 2 helpers     Walk 50 feet activity   Assist Walk 50 feet with 2 turns activity did not occur: Safety/medical concerns         Walk 150 feet activity   Assist Walk 150 feet activity did not occur: Safety/medical concerns         Walk 10 feet on uneven surface  activity   Assist Walk 10 feet on uneven surfaces activity did not occur: Safety/medical concerns         Wheelchair     Assist Will patient use wheelchair at discharge?: No   Wheelchair activity did not occur: N/A         Wheelchair 50 feet with 2 turns activity    Assist    Wheelchair 50 feet with 2 turns activity did not occur: N/A       Wheelchair 150 feet activity     Assist  Wheelchair 150 feet activity did not occur: N/A        Medical Problem List and Plan: 1.Functional deficitssecondary to [place] with cortical blindness and confusion  Continue CIR therapies as tolerated, 15/7 2. Antithrombotics: -DVT/anticoagulation:Mechanical:Sequential compression devices, below kneeBilateral lower extremities -antiplatelet therapy: N/A 3. Pain Management:N/A 4. Mood:LCSW to follow for evaluation and support when appropriate -antipsychotic agents: continue scheduled seroquel at HS  50mg   to help with sleep/wake cycle 5. Neuropsych: This patientis notcapable of making decisions on hisown behalf. 6. Skin/Wound Care:Routine pressure relief measures 7. Fluids/Electrolytes/Nutrition: continue to push po, supervision with meals  -supplements between meals 8.UVO:ZDGUYQIH Lopressor.    Amlodipine increased to 7.5 on 8/12  Vitals:   11/27/18 2010 11/28/18 0405  BP: (!) 166/83 (!) 175/88  Pulse: 88 80  Resp: 18 16  Temp: 98.3 F (36.8 C) 98 F (36.7 C)  SpO2: 98% 100%  Elevated increase amlodipine to 10mg , if unable to take oral will need to start Catapres patch 9.Agitation: Continue Depakote every 8 hours.  -seroquel as above 10.GERD: On Protonix 11.Leukocytosis: Monitor for signs of infection.  Recheck CBC in a.m.  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 Don't think it's relevant at present 12. Urinary retention: Continue Flomax.   -UA negative, UCX negative x 2.   - Monitor voiding with bladder scan and cath for volumes > 350 cc  -OOB to void when possible 13. Lethargy: suspect this was due to depakote and altered sleep-wake cycle in setting of CVA, patient is afebrile, if spikes a temp will need to reculture and recheck chest x-ray  -CXR and CT without acute changes  -ucx negative x 2  empiric ceftriaxone started---dc  today  -stopped vpa  -supportive measures  - continue IVF hs  -resumed 50mg  seroquel  LOS: 9 days A FACE TO FACE EVALUATION WAS PERFORMED  Charlett Blake 11/28/2018, 1:35 PM

## 2018-11-28 NOTE — Progress Notes (Signed)
STROKE SERVICE ADDENDUM  MRI Brain 11/26/18 shows stable appearance of subacute bilateral cerebral hematomas with edema and mass-effect as expected evolutionary changes.  No acute abnormality.  Studies slightly suboptimal due to severe motion degradation. EEG shows mild to moderate generalized slowing without definite epileptiform activity. Recommend continue on Depakote and Seroquel for agitation.  Kindly call for questions.  Antony Contras, MD

## 2018-11-28 NOTE — Plan of Care (Signed)
  Problem: RH SKIN INTEGRITY Goal: RH STG MAINTAIN SKIN INTEGRITY WITH ASSISTANCE Description: STG Maintain Skin Integrity With Assistance. Mod Outcome: Progressing   Problem: RH BOWEL ELIMINATION Goal: RH STG MANAGE BOWEL WITH ASSISTANCE Description: STG Manage Bowel with Assistance. Mod Outcome: Not Progressing   Problem: RH BLADDER ELIMINATION Goal: RH STG MANAGE BLADDER WITH ASSISTANCE Description: STG Manage Bladder With Assistance. Mod Outcome: Not Progressing   Problem: RH SAFETY Goal: RH STG ADHERE TO SAFETY PRECAUTIONS W/ASSISTANCE/DEVICE Description: STG Adhere to Safety Precautions With Assistance/Device. Max Outcome: Not Progressing   Problem: RH COGNITION-NURSING Goal: RH STG USES MEMORY AIDS/STRATEGIES W/ASSIST TO PROBLEM SOLVE Description: STG Uses Memory Aids/Strategies With Assistance to Problem Solve. Mod Outcome: Not Progressing   Problem: RH Vision Goal: RH LTG Vision (Specify) Outcome: Not Progressing   Problem: Consults Goal: RH BRAIN INJURY PATIENT EDUCATION Description: Description: See Patient Education module for eduction specifics Outcome: Not Progressing

## 2018-11-28 NOTE — Progress Notes (Signed)
Physical Therapy Session Note  Patient Details  Name: Walter Larsen MRN: [id number] Date of Birth: 04-Nov-1935  Today's Date: 11/28/2018 PT Individual Time: 0802-0836 PT Individual Time Calculation (min): 34 min   Short Term Goals: Week 2:  PT Short Term Goal 1 (Week 2): Pt will transfer bed<>chair w/ max assist x1 consistently PT Short Term Goal 2 (Week 2): Pt will initiate functional tasks w/ mod cues 50% of the time PT Short Term Goal 3 (Week 2): Pt will perform bed mobility w/ mod assist PT Short Term Goal 4 (Week 2): Pt will ambulate 10' w/ max assist x1  Skilled Therapeutic Interventions/Progress Updates:    Patient in supine, RN reports unable to give medications due to lethargy s/p getting fed ice cream.  Patient lethargic in supine with total A to don pants, did help with R leg with cues only to lift.  Patient supine to sit max to total A to lift trunk after legs off bed.  Grumbling and reporting wants to be left alone despite redirection and education on reasons to participate.  Sat EOB about 15 min with work to decrease extensor tone in neck and lower trunk with flexion stretch when allowed and soft tissue work to posterior c-spine musculature along with AAROM/PROM.  Patient assisted to scoot EOB after guiding L UE to seat for orientation.  Assisted with total A to scoot halfway between bed and TIS w/c.  However, pt not participating and leaning posteriorly so total A to return to supine to avoid LOB or fall between bed and chair.  Assisted to roll and scoot then with RN in room assisted back to sitting and scooted to safe spot on bed.  RN attempted medications again without success.  Patient assisted to supine and HOB elevated 30 degrees.  Bed alarm activated.    Missed 26 minutes of skilled PT due to lethargy.   Therapy Documentation Precautions:  Precautions Precautions: Fall Precaution Comments: cortical blindness Restrictions Weight Bearing Restrictions: No General: PT  Amount of Missed Time (min): 26 Minutes PT Missed Treatment Reason: Patient fatigue Pain: Pain Assessment Pain Scale: Faces Pain Score: 0-No pain Faces Pain Scale: No hurt     Therapy/Group: Individual Therapy  Reginia Naas  Magda Kiel, PT 11/28/2018, 8:46 AM

## 2018-11-28 NOTE — Progress Notes (Signed)
Physical Therapy Session Note  Patient Details  Name: Walter Larsen MRN: [id number] Date of Birth: [birth year]  Today's Date: 11/28/2018 PT Individual Time: 5170-0174 PT Individual Time Calculation (min): 25 min   Short Term Goals: Week 2:  PT Short Term Goal 1 (Week 2): Pt will transfer bed<>chair w/ max assist x1 consistently PT Short Term Goal 2 (Week 2): Pt will initiate functional tasks w/ mod cues 50% of the time PT Short Term Goal 3 (Week 2): Pt will perform bed mobility w/ mod assist PT Short Term Goal 4 (Week 2): Pt will ambulate 10' w/ max assist x1  Skilled Therapeutic Interventions/Progress Updates:    Patient in bed with nursing in room and assist to roll with max A for hygiene due to soiled brief.  Assist to hold hands during I-O cath procedure with nursing.  Then supine to sit max A +2 and scooting transfer to w/c with max A +2.  Patient positioned for safety and comfort in TIS w/c and placed at nursing station for safety with seat belt alarm.    Therapy Documentation Precautions:  Precautions Precautions: Fall Precaution Comments: cortical blindness Restrictions Weight Bearing Restrictions: No General: PT Amount of Missed Time (min): 26 Minutes PT Missed Treatment Reason: Patient fatigue Pain: Pain Assessment Pain Scale: Faces Pain Score: 0-No pain Faces Pain Scale: No hurt    Therapy/Group: Individual Therapy  Reginia Naas  Magda Kiel, PT 11/28/2018, 10:39 AM

## 2018-11-29 ENCOUNTER — Inpatient Hospital Stay (HOSPITAL_COMMUNITY): Payer: Medicare HMO | Admitting: Occupational Therapy

## 2018-11-29 ENCOUNTER — Inpatient Hospital Stay (HOSPITAL_COMMUNITY): Payer: Medicare HMO | Admitting: Physical Therapy

## 2018-11-29 ENCOUNTER — Inpatient Hospital Stay (HOSPITAL_COMMUNITY): Payer: Medicare HMO | Admitting: Speech Pathology

## 2018-11-29 LAB — BASIC METABOLIC PANEL
Anion gap: 11 (ref 5–15)
BUN: 10 mg/dL (ref 8–23)
CO2: 20 mmol/L — ABNORMAL LOW (ref 22–32)
Calcium: 8.8 mg/dL — ABNORMAL LOW (ref 8.9–10.3)
Chloride: 100 mmol/L (ref 98–111)
Creatinine, Ser: 0.72 mg/dL (ref 0.61–1.24)
GFR calc Af Amer: >60 mL/min (ref >60)
GFR calc non Af Amer: >60 mL/min (ref >60)
Glucose, Bld: 121 mg/dL — ABNORMAL HIGH (ref 70–99)
Potassium: 3.8 mmol/L (ref 3.5–5.1)
Sodium: 131 mmol/L — ABNORMAL LOW (ref 135–145)

## 2018-11-29 LAB — CBC WITH DIFFERENTIAL/PLATELET
Abs Immature Granulocytes: 0.33 10*3/uL — ABNORMAL HIGH (ref 0.00–0.07)
Basophils Absolute: 0.1 10*3/uL (ref 0.0–0.1)
Basophils Relative: 0 %
Eosinophils Absolute: 0.2 10*3/uL (ref 0.0–0.5)
Eosinophils Relative: 1 %
HCT: 42.8 % (ref 39.0–52.0)
Hemoglobin: 14.7 g/dL (ref 13.0–17.0)
Immature Granulocytes: 2 %
Lymphocytes Relative: 10 %
Lymphs Abs: 1.4 10*3/uL (ref 0.7–4.0)
MCH: 31.6 pg (ref 26.0–34.0)
MCHC: 34.3 g/dL (ref 30.0–36.0)
MCV: 92 fL (ref 80.0–100.0)
Monocytes Absolute: 2.5 10*3/uL — ABNORMAL HIGH (ref 0.1–1.0)
Monocytes Relative: 17 %
Neutro Abs: 10 10*3/uL — ABNORMAL HIGH (ref 1.7–7.7)
Neutrophils Relative %: 70 %
Platelets: 417 10*3/uL — ABNORMAL HIGH (ref 150–400)
RBC: 4.65 MIL/uL (ref 4.22–5.81)
RDW: 12.5 % (ref 11.5–15.5)
WBC: 14.5 10*3/uL — ABNORMAL HIGH (ref 4.0–10.5)
nRBC: 0 % (ref 0.0–0.2)

## 2018-11-29 LAB — PREALBUMIN: Prealbumin: 13.5 mg/dL — ABNORMAL LOW (ref 18–38)

## 2018-11-29 MED ORDER — DEXTROSE-NACL 5-0.45 % IV SOLN
INTRAVENOUS | Status: DC
  Administered 2018-11-29 – 2018-12-05 (×14): via INTRAVENOUS

## 2018-11-29 NOTE — Progress Notes (Signed)
Patient alert to self, VS WNL. No change in condition. Patient would not eat meals and only drink a few sips. Linna Hoff, [place] notified. No new orders at this time, family aware. Will continue plan of care. Audie Clear, LPN

## 2018-11-29 NOTE — Progress Notes (Signed)
Speech Language Pathology Daily Session Note  Patient Details  Name: Walter Larsen MRN: [id number] Date of Birth: 09-25-1935  Today's Date: 11/29/2018 SLP Individual Time: 1030-1050 SLP Individual Time Calculation (min): 20 min  Short Term Goals: Week 2: SLP Short Term Goal 1 (Week 2): Pt will consume current diet with minimal overt s/s of aspiration and Mod A cues for use of compensatory swallow strategies. SLP Short Term Goal 2 (Week 2): Pt will follow 1 step basic directions in 2 out of 10 opportunities with Max A multimodal cues. SLP Short Term Goal 3 (Week 2): Pt will sustain attention to basic familiar task for ~ 1 minute with Max A multimodal cues.  Skilled Therapeutic Interventions: Skilled treatment session focused on cognitive goals. SLP facilitated session by providing total hand over hand assist to perform basic and familiar grooming tasks. Patient was awake initially with his eyes open but with verbosity that consistent with language of confusion. Patient also required total A for orientation to place and situation. Patient became lethargic towards end of session and kept his eyes close with minimal verbal response, therefore, patient missed remaining 10 minutes of session. Patient left supine in bed with alarm on and all needs within reach. Continue with current plan of care.      Pain No/Denies Pain   Therapy/Group: Individual Therapy  Ahmoni Edge 11/29/2018, 12:13 PM

## 2018-11-29 NOTE — Progress Notes (Signed)
[facility] PHYSICAL MEDICINE & REHABILITATION PROGRESS NOTE   Subjective/Complaints: Awake in bed. Eyes open. Confused  ROS: Limited due to cognitive/behavioral     Objective:   No results found. No results for input(s): WBC, HGB, HCT, PLT in the last 72 hours. No results for input(s): NA, K, CL, CO2, GLUCOSE, BUN, CREATININE, CALCIUM in the last 72 hours.  Intake/Output Summary (Last 24 hours) at 11/29/2018 1005 Last data filed at 11/29/2018 0258 Gross per 24 hour  Intake 2127.72 ml  Output 1900 ml  Net 227.72 ml     Physical Exam: Vital Signs Blood pressure (!) 165/80, pulse 87, temperature 97.6 F (36.4 C), temperature source Oral, resp. rate 16, weight 83.9 kg, SpO2 98 %. Constitutional: No distress . Vital signs reviewed. HEENT: EOMI, oral membranes moist Neck: supple Cardiovascular: RRR without murmur. No JVD    Respiratory: CTA Bilaterally without wheezes or rales. Normal effort    GI: BS +, non-tender, non-distended  Skin: Warm and dry.  Intact. Psych: Normal mood.  Normal behavior. Musc: No edema.  No tenderness. Neurological:oriented to self only  Follows commands with upper extremities but not with lower extremities. Skin: Skin iswarmand dry.  Psychiatric:awake but distracted    Assessment/Plan: 1. Functional deficits secondary to bilateral posterior circulation infarcts which require 3+ hours per day of interdisciplinary therapy in a comprehensive inpatient rehab setting.  Physiatrist is providing close team supervision and 24 hour management of active medical problems listed below.  Physiatrist and rehab team continue to assess barriers to discharge/monitor patient progress toward functional and medical goals  Care Tool:  Bathing    Body parts bathed by patient: Face   Body parts bathed by helper: Front perineal area, Buttocks, Right upper leg, Left upper leg, Left lower leg, Right lower leg     Bathing assist Assist Level: Maximal Assistance  - Patient 24 - 49%     Upper Body Dressing/Undressing Upper body dressing   What is the patient wearing?: Pull over shirt    Upper body assist Assist Level: Total Assistance - Patient < 25%    Lower Body Dressing/Undressing Lower body dressing      What is the patient wearing?: Pants     Lower body assist Assist for lower body dressing: Total Assistance - Patient < 25%     Toileting Toileting    Toileting assist Assist for toileting: Total Assistance - Patient < 25%     Transfers Chair/bed transfer  Transfers assist     Chair/bed transfer assist level: 2 Helpers     Locomotion Ambulation   Ambulation assist   Ambulation activity did not occur: Safety/medical concerns  Assist level: 2 helpers Assistive device: No Device Max distance: 15'   Walk 10 feet activity   Assist  Walk 10 feet activity did not occur: Safety/medical concerns  Assist level: 2 helpers     Walk 50 feet activity   Assist Walk 50 feet with 2 turns activity did not occur: Safety/medical concerns         Walk 150 feet activity   Assist Walk 150 feet activity did not occur: Safety/medical concerns         Walk 10 feet on uneven surface  activity   Assist Walk 10 feet on uneven surfaces activity did not occur: Safety/medical concerns         Wheelchair     Assist Will patient use wheelchair at discharge?: No   Wheelchair activity did not occur: N/A  Wheelchair 50 feet with 2 turns activity    Assist    Wheelchair 50 feet with 2 turns activity did not occur: N/A       Wheelchair 150 feet activity     Assist Wheelchair 150 feet activity did not occur: N/A        Medical Problem List and Plan: 1.Functional deficitssecondary to [place] with cortical blindness and confusion  Continue CIR therapies as tolerated, 15/7  -consider palliative care consult  -will need to discuss prognosis with family, he's eating nothing 2.  Antithrombotics: -DVT/anticoagulation:Mechanical:Sequential compression devices, below kneeBilateral lower extremities -antiplatelet therapy: N/A 3. Pain Management:N/A 4. Mood:LCSW to follow for evaluation and support when appropriate -antipsychotic agents: off scheduled seroquel 5. Neuropsych: This patientis notcapable of making decisions on hisown behalf. 6. Skin/Wound Care:Routine pressure relief measures 7. Fluids/Electrolytes/Nutrition: continue to push po, supervision with meals  -supplements between meals 8.SRP:RXYVOPFY Lopressor.    Amlodipine increased to 7.5 on 8/12  Vitals:   11/28/18 1936 11/29/18 0347  BP: (!) 149/83 (!) 165/80  Pulse: 93 87  Resp: 18 16  Temp: 97.9 F (36.6 C) 97.6 F (36.4 C)  SpO2: 97% 98%    Amlodipine  10mg , if unable to take oral will need to start Catapres patch 9.Agitation: Continue Depakote every 8 hours.  -seroquel as above 10.GERD: On Protonix 11.Leukocytosis: Monitor for signs of infection.  Recheck CBC in a.m.  WBCs 13.9 on 8/12---14.7 8/14  -in reviewing chart, leukocytosis is chronic with neutrophilia---consider outpt h/o follow up. Don't think it's relevant at present 12. Urinary retention: Continue Flomax.   -UA negative, UCX negative x 2.   - Monitor voiding with bladder scan and cath for volumes > 350 cc  -OOB to void when possible 13. Lethargy: suspect this was due to depakote and altered sleep-wake cycle in setting of CVA, patient is afebrile, if spikes a temp will need to reculture and recheck chest x-ray  -CXR and CT without acute changes  -ucx negative x 2  empiric ceftriaxone dc'ed  -stopped vpa  -supportive measures  - continue IVF hs---change to d5/NS     LOS: 10 days A FACE TO FACE EVALUATION WAS PERFORMED  Meredith Staggers 11/29/2018, 10:05 AM

## 2018-11-29 NOTE — Progress Notes (Signed)
Occupational Therapy Session Note  Patient Details  Name: Walter Larsen MRN: [id number] Date of Birth: 11/11/1935  Today's Date: 11/29/2018 OT Individual Time: 1305-1345 OT Individual Time Calculation (min): 40 min    Short Term Goals: Week 2:  OT Short Term Goal 1 (Week 2): Pt will complete 1 step of UB dressing task with max multimodal cues OT Short Term Goal 2 (Week 2): Pt will tolerate sitting EOB for 5 mins in preparation for BADL task OT Short Term Goal 3 (Week 2): Pt will complete functional transfer with LRAD and Max A of 1 person consistently  Skilled Therapeutic Interventions/Progress Updates:    Pt greeted semi-reclined in bed with eyes closed, muttering to himself. OT tried to engage pt in LB dressing task with pt unable to follow any commands and very restless. Pt needed total A to thread pants, then was resistant to rolling in order to pull pants over hips. Phlebotomist entered room to obtain blood. OT assisted with keeping R UE still while blood was being drawn. Given pt's mental status and inability to follow commands, OT decided to use Maxisky to try to get pt OOB. OT unable to roll pt to place sling as pt pushing back on therapist and swatting at therapist. OT felt it was not safe to get pt to the wc at this time.  OT placed bed in trendelendurg to pull pt up and facilitate full upright position for feeding. OT placed built up handle on utensil and provided hand over hand A to try to engage pt in self-feeding task. Pt resistive to hand over hand A and pushing therapist away. OT dependently placed food bolus in patients mouth and he began swatting therapists hand. OT provided max cues and tried to give pt water with straw, but could not get patient to swallow food bolus. OT had to use suction to remove food bolus for safety. Pt continued to mumble non sensical words and sentences throughout session. OT provided pt with wash cloth to try to get him to wash his face, but he was unable  to complete functional task with max multimodal cues and hand over hand A. Pt left semi-reclined in bed with bed alarm on and needs met.  Therapy Documentation Precautions:  Precautions Precautions: Fall Precaution Comments: cortical blindness Restrictions Weight Bearing Restrictions: No Pain:   no pain stated  Therapy/Group: Individual Therapy   S  11/29/2018, 1:22 PM 

## 2018-11-29 NOTE — Progress Notes (Signed)
Physical Therapy Session Note  Patient Details  Name: Walter Larsen MRN: [id number] Date of Birth: December 27, 1935  Today's Date: 11/29/2018 PT Individual Time: 0805-0830 AND 4128-7867 PT Individual Time Calculation (min): 25 min AND 15 min  Short Term Goals: Week 2:  PT Short Term Goal 1 (Week 2): Pt will transfer bed<>chair w/ max assist x1 consistently PT Short Term Goal 2 (Week 2): Pt will initiate functional tasks w/ mod cues 50% of the time PT Short Term Goal 3 (Week 2): Pt will perform bed mobility w/ mod assist PT Short Term Goal 4 (Week 2): Pt will ambulate 10' w/ max assist x1  Skilled Therapeutic Interventions/Progress Updates:   Session 1:  Pt in supine and awake, eyes open, and agreeable to therapy. Pt c/o back pain w/ all movement, does not rate and resolves once back in supine. Dependent supine>sit transfer, tactile cues to use UEs to guide his movement. Static sitting balance w/ close supervision for ~15 min in total. Set-up to eat breakfast tray at EOB, however pt refused despite frequent attempts. Pt more agitated this morning and perseverative w/ UE movements. Donned pants total assist and made multiple attempts to stand including arm-over-shoulder, bilateral HHA. Unable to reach full upright, total assist to bring pants over hips. Tactile and verbal cues to guide pt's UEs to orient to bed and w/c set-up. Pt would verbalize that he would get up to the w/c, however would not translate to functional movement. Therapist attempted to perform dependent squat pivot, however pt resisting to the point where therapist had to dependently lay pt back down on bed to prevent fall. Pt then stated "quit messing with me" and "I can't do this today". Ended session in supine, however upside down from normal bed position and pt refusing to move from position. However pt safe in position and bed placed in level position and lowest setting. All needs in reach and NT made aware of status.   Session 2:   Pt awake in supine, verbalizing but non-coherent sentences. Pt agreeable to sit up and try to eat breakfast again. Raised bed up to full upright sitting and supported pt on R side w/ pillows as he was able to stay in middle of bed. Provided w/ hand-over-hand bites of magic cup, pt required tactile cue to spoon at mouth as he would not open mouth in response to verbal commands. Pt appropriately swallowing after each bite. Pt again perseverative with UE movements, required frequent tactile and verbal cues to attend to task. Pt unable to hold spoon w/o total assist. Pt refusing further bites and becoming increasingly restless and not attending to task. Ended session in supine, all needs in reach.   Therapy Documentation Precautions:  Precautions Precautions: Fall Precaution Comments: cortical blindness Restrictions Weight Bearing Restrictions: No  Therapy/Group: Individual Therapy  Garcia Dalzell Clent Demark 11/29/2018, 8:36 AM

## 2018-11-30 ENCOUNTER — Inpatient Hospital Stay (HOSPITAL_COMMUNITY): Payer: Medicare HMO

## 2018-11-30 ENCOUNTER — Inpatient Hospital Stay (HOSPITAL_COMMUNITY): Payer: Medicare HMO | Admitting: Occupational Therapy

## 2018-11-30 ENCOUNTER — Inpatient Hospital Stay (HOSPITAL_COMMUNITY): Payer: Medicare HMO | Admitting: Physical Therapy

## 2018-11-30 DIAGNOSIS — R1312 Dysphagia, oropharyngeal phase: Secondary | ICD-10-CM

## 2018-11-30 MED ORDER — METHYLPHENIDATE HCL 5 MG PO TABS
5.0000 mg | ORAL_TABLET | Freq: Two times a day (BID) | ORAL | Status: DC
  Administered 2018-11-30 – 2018-12-01 (×2): 5 mg via ORAL
  Filled 2018-11-30 (×2): qty 1

## 2018-11-30 MED ORDER — MEGESTROL ACETATE 400 MG/10ML PO SUSP
400.0000 mg | Freq: Two times a day (BID) | ORAL | Status: DC
  Administered 2018-12-01 (×2): 400 mg via ORAL
  Filled 2018-11-30 (×5): qty 10

## 2018-11-30 NOTE — Progress Notes (Signed)
Speech Language Pathology Daily Session Note  Patient Details  Name: Walter Larsen MRN: [id number] Date of Birth: 1935/09/20  Today's Date: 11/30/2018 SLP Individual Time: 4098-1191 SLP Individual Time Calculation (min): 15 min  Short Term Goals: Week 2: SLP Short Term Goal 1 (Week 2): Pt will consume current diet with minimal overt s/s of aspiration and Mod A cues for use of compensatory swallow strategies. SLP Short Term Goal 2 (Week 2): Pt will follow 1 step basic directions in 2 out of 10 opportunities with Max A multimodal cues. SLP Short Term Goal 3 (Week 2): Pt will sustain attention to basic familiar task for ~ 1 minute with Max A multimodal cues.  Skilled Therapeutic Interventions:Skilled ST services focused on cognitive skills. Upon entering room pt was asleep in bed and ST noted only a few spoonfuls missing from dys 1 lunch tray. SLP woke up pt with verbal and tactile cues, opening eyes and offered to provided something to eat/drink from tray, pt only mumbled unintelligibly. Pt partially agreed to be repositioned in bed stating " I don't give a crap." SLP repositioned pt in bed to 45 degrees, pt remained partially asleep with eyes closed and speech was unintelligible. SLP provided max A verbal and tactile cues to arouse pt, however pt fell asleep audibly snoring, therefore SLP repositioned pt to laying position and ended treatment time. Pt missed 15 minutes of treatment due to fatigue in ability to arouse. Pt was left in room with call bell within reach and bed alarm set. ST recommends to continue skilled ST services.      Pain Pain Assessment Pain Score: 0-No pain  Therapy/Group: Individual Therapy  Monie Shere  [facility] 11/30/2018, 2:19 PM

## 2018-11-30 NOTE — Progress Notes (Signed)
Speech Language Pathology Daily Make-Up Session Note  Patient Details  Name: Walter Larsen MRN: [id number] Date of Birth: 1935-07-14  Today's Date: 11/30/2018 SLP Individual Time: 4287-6811 SLP Individual Time Calculation (min): 10 min  Short Term Goals: Week 2: SLP Short Term Goal 1 (Week 2): Pt will consume current diet with minimal overt s/s of aspiration and Mod A cues for use of compensatory swallow strategies. SLP Short Term Goal 2 (Week 2): Pt will follow 1 step basic directions in 2 out of 10 opportunities with Max A multimodal cues. SLP Short Term Goal 3 (Week 2): Pt will sustain attention to basic familiar task for ~ 1 minute with Max A multimodal cues.  Skilled Therapeutic Interventions: Skilled treatment session focused on dysphagia goals. Upon arrival, patient was lethargic and unable to follow directions to open his mouth to receive PO intake via straw or spoon despite Max A multimodal cues. Despite multiple attempts, patient swatting at cup and continuing to decline. Patient left supine in bed with alarm on and mitt in place. Continue with current plan of care.      Pain No/Denies Pain  Therapy/Group: Individual Therapy  Allene Furuya 11/30/2018, 10:33 AM

## 2018-11-30 NOTE — Progress Notes (Signed)
Occupational Therapy Session Note  Patient Details  Name: Walter Larsen MRN: [id number] Date of Birth: January 03, 1936  Today's Date: 11/30/2018 OT Individual Time: 0730-0800 OT Individual Time Calculation (min): 30 min  and Today's Date: 11/30/2018 OT Missed Time: 15 Minutes Missed Time Reason: Patient fatigue   Short Term Goals: Week 2:  OT Short Term Goal 1 (Week 2): Pt will complete 1 step of UB dressing task with max multimodal cues OT Short Term Goal 2 (Week 2): Pt will tolerate sitting EOB for 5 mins in preparation for BADL task OT Short Term Goal 3 (Week 2): Pt will complete functional transfer with LRAD and Max A of 1 person consistently  Skilled Therapeutic Interventions/Progress Updates:    Pt greeted in bed, muttering to himself. OT tried to engage pt in LB dressing task, but he was unable to follow any commands and required total A. Pt more alert initially, trying to open his eyes at times and talk. OT dependently sat pt EOB with strong posterior push the entire time OT trying to sit him up. Eventually able to get pt to EOB but he continued to push back and it was not safe for him to remain at EOB. Total A to transition back to bed. OT attempted to sit EOB again after resting, but OT felt it was not safe. Pt asleep as soon as he laid back down and would not participate further. Pt left sidelying in bed with needs met.    Therapy Documentation Precautions:  Precautions Precautions: Fall Precaution Comments: cortical blindness Restrictions Weight Bearing Restrictions: No Pain:   unable to state pain  Therapy/Group: Individual Therapy  Valma Cava 11/30/2018, 7:54 AM

## 2018-11-30 NOTE — Progress Notes (Signed)
Physical Therapy Session Note  Patient Details  Name: Walter Larsen MRN: [id number] Date of Birth: 1936/01/13  Today's Date: 11/30/2018 PT Individual Time: 1100-1115 PT Individual Time Calculation (min): 15 min   Short Term Goals: Week 2:  PT Short Term Goal 1 (Week 2): Pt will transfer bed<>chair w/ max assist x1 consistently PT Short Term Goal 2 (Week 2): Pt will initiate functional tasks w/ mod cues 50% of the time PT Short Term Goal 3 (Week 2): Pt will perform bed mobility w/ mod assist PT Short Term Goal 4 (Week 2): Pt will ambulate 10' w/ max assist x1  Skilled Therapeutic Interventions/Progress Updates:   Pt in supine and verbalizing but incoherent. Pt noted to be incontinent of bowel. Dependent R/L rolling for pericare and brief management. Pt actively resisting this therapist and yelling out when being moved. Attempted to orient pt to situation w/ hand-over-hand and verbal cues, unsuccessful. Unable to meaningfully engage pt in therapeutic session at this time. Missed 45 min of skilled PT 2/2 agitation.   Therapy Documentation Precautions:  Precautions Precautions: Fall Precaution Comments: cortical blindness Restrictions Weight Bearing Restrictions: No General: PT Amount of Missed Time (min): 45 Minutes PT Missed Treatment Reason: Increased agitation  Therapy/Group: Individual Therapy  Javelle Donigan K Triva Hueber 11/30/2018, 11:59 AM

## 2018-11-30 NOTE — Progress Notes (Addendum)
[facility] PHYSICAL MEDICINE & REHABILITATION PROGRESS NOTE   Subjective/Complaints: Pt awake in bed. Eyes closed. Limited participation with OT in the room  ROS: Limited due to cognitive/behavioral    Objective:   No results found. Recent Labs    11/29/18 1319  WBC 14.5*  HGB 14.7  HCT 42.8  PLT 417*   Recent Labs    11/29/18 1319  NA 131*  K 3.8  CL 100  CO2 20*  GLUCOSE 121*  BUN 10  CREATININE 0.72  CALCIUM 8.8*    Intake/Output Summary (Last 24 hours) at 11/30/2018 1210 Last data filed at 11/30/2018 0948 Gross per 24 hour  Intake 1270.65 ml  Output 2300 ml  Net -1029.35 ml     Physical Exam: Vital Signs Blood pressure (!) 154/81, pulse 84, temperature 98.4 F (36.9 C), temperature source Oral, resp. rate 18, weight 83.9 kg, SpO2 100 %. Constitutional:   Vital signs reviewed. HEENT: EOMI, oral membranes moist Neck: supple Cardiovascular: RRR without murmur. No JVD    Respiratory: CTA Bilaterally without wheezes or rales. Normal effort    GI: BS +, non-tender, non-distended   Skin: Warm and dry.  Intact. Psych: Normal mood.  Normal behavior. Musc: No edema.  No tenderness. Neurological:oriented to self only. Echolalic. Little insight or awareness  Follows commands with upper extremities but not with lower extremities. Skin: Skin iswarmand dry.  Psychiatric: lying in bed with eyes closed. Withdraws with any attempt to physically interact    Assessment/Plan: 1. Functional deficits secondary to bilateral posterior circulation infarcts which require 3+ hours per day of interdisciplinary therapy in a comprehensive inpatient rehab setting.  Physiatrist is providing close team supervision and 24 hour management of active medical problems listed below.  Physiatrist and rehab team continue to assess barriers to discharge/monitor patient progress toward functional and medical goals  Care Tool:  Bathing    Body parts bathed by patient: Face   Body  parts bathed by helper: Front perineal area, Buttocks, Right upper leg, Left upper leg, Left lower leg, Right lower leg     Bathing assist Assist Level: Maximal Assistance - Patient 24 - 49%     Upper Body Dressing/Undressing Upper body dressing   What is the patient wearing?: Pull over shirt    Upper body assist Assist Level: Total Assistance - Patient < 25%    Lower Body Dressing/Undressing Lower body dressing      What is the patient wearing?: Pants     Lower body assist Assist for lower body dressing: Total Assistance - Patient < 25%     Toileting Toileting    Toileting assist Assist for toileting: Dependent - Patient 0%     Transfers Chair/bed transfer  Transfers assist     Chair/bed transfer assist level: 2 Helpers     Locomotion Ambulation   Ambulation assist   Ambulation activity did not occur: Safety/medical concerns  Assist level: 2 helpers Assistive device: No Device Max distance: 15'   Walk 10 feet activity   Assist  Walk 10 feet activity did not occur: Safety/medical concerns  Assist level: 2 helpers     Walk 50 feet activity   Assist Walk 50 feet with 2 turns activity did not occur: Safety/medical concerns         Walk 150 feet activity   Assist Walk 150 feet activity did not occur: Safety/medical concerns         Walk 10 feet on uneven surface  activity   Assist  Walk 10 feet on uneven surfaces activity did not occur: Safety/medical concerns         Wheelchair     Assist Will patient use wheelchair at discharge?: No   Wheelchair activity did not occur: N/A         Wheelchair 50 feet with 2 turns activity    Assist    Wheelchair 50 feet with 2 turns activity did not occur: N/A       Wheelchair 150 feet activity     Assist Wheelchair 150 feet activity did not occur: N/A        Medical Problem List and Plan: 1.Functional deficitssecondary to [place] with cortical blindness and  confusion  Continue CIR therapies as tolerated, 15/7  -discussed prognosis at length with pt's son [name]. He is aware of his status. He will d/w mother and review their living will. [name] is healthcare POA. They are considering PEG 2. Antithrombotics: -DVT/anticoagulation:Mechanical:Sequential compression devices, below kneeBilateral lower extremities -antiplatelet therapy: N/A 3. Pain Management:N/A 4. Mood:LCSW to follow for evaluation and support when appropriate -antipsychotic agents: off scheduled seroquel  -begin trial of ritalin, consider amantadine as well 5. Neuropsych: This patientis notcapable of making decisions on hisown behalf. 6. Skin/Wound Care:Routine pressure relief measures 7. Fluids/Electrolytes/Nutrition: continue to push po, full supervision with meals  -supplements between meals as possible  -add megace   -consider PEG 8.HTN:Continue Lopressor.    Amlodipine increased to 7.5 on 8/12   -consider catapres d/t inconsistent swallowing Vitals:   11/29/18 1929 11/30/18 0308  BP: (!) 160/93 (!) 154/81  Pulse: 87 84  Resp: 18 18  Temp: 98.8 F (37.1 C) 98.4 F (36.9 C)  SpO2: 97% 100%   9.Agitation: depakote and seroquel stopped 10.GERD: On Protonix 11.Leukocytosis: Monitor for signs of infection.  Recheck CBC in a.m.  WBCs 13.9 on 8/12---14.7 8/14  -in reviewing chart, leukocytosis is chronic with neutrophilia---consider outpt h/o follow up. Don't think it's relevant at present 12. Urinary retention: Continue Flomax.   -UA negative, UCX negative x 2.   - mostly d/t poor cognition/awareness 13. Lethargy: suspect this was due to depakote and altered sleep-wake cycle in setting of CVA, patient is afebrile, if spikes a temp will need to reculture and recheck chest x-ray  -CXR and CT without acute changes  -ucx negative x 2  empiric ceftriaxone dc'ed  -stopped vpa  -supportive measures  - continue IVF hs-- d5/ 1/2NS     Greater than 35 total minutes was spent examining patient, reviewing data, formulating a treatment plan, and in discussion with  family.     LOS: 11 days A FACE TO [place] 11/30/2018, 12:10 PM

## 2018-11-30 NOTE — Plan of Care (Signed)
Pt frequency downgraded to 1x/day for each discipline 2/2 lack of progress and poor OOB tolerance.   Burnard Bunting, PT, DPT

## 2018-11-30 NOTE — Progress Notes (Signed)
Patient having problem swallowing his meds last night

## 2018-12-01 ENCOUNTER — Inpatient Hospital Stay (HOSPITAL_COMMUNITY): Payer: Medicare HMO | Admitting: Speech Pathology

## 2018-12-01 ENCOUNTER — Inpatient Hospital Stay (HOSPITAL_COMMUNITY): Payer: Medicare HMO | Admitting: Physical Therapy

## 2018-12-01 ENCOUNTER — Inpatient Hospital Stay (HOSPITAL_COMMUNITY): Payer: Medicare HMO | Admitting: Occupational Therapy

## 2018-12-01 ENCOUNTER — Encounter (HOSPITAL_COMMUNITY): Payer: Self-pay

## 2018-12-01 MED ORDER — METHYLPHENIDATE HCL 5 MG PO TABS
10.0000 mg | ORAL_TABLET | Freq: Two times a day (BID) | ORAL | Status: DC
  Administered 2018-12-01 – 2018-12-06 (×5): 10 mg via ORAL
  Filled 2018-12-01 (×8): qty 2

## 2018-12-01 NOTE — Patient Care Conference (Signed)
Inpatient RehabilitationTeam Conference and Plan of Care Update Date: 11/30/2018   Time: 10:30 AM    Patient Name: Walter Larsen      Medical Record Number: [id number]  Date of Birth: 1935-12-05 Sex: Male         Room/Bed: 4W11C/4W11C-01 Payor Info: Payor: [employer] MEDICARE / Plan: [employer] MEDICARE HMO/PPO / Product Type: *No Product type* /    Admitting Diagnosis: 2. ABI Team  B CVA, ICH; 22-24days  Admit Date/Time:  11/19/2018  3:31 PM Admission Comments: No comment available   Primary Diagnosis:  ICH (intracerebral hemorrhage) ([place]) Principal Problem: ICH (intracerebral hemorrhage) ([place])  Patient Active Problem List   Diagnosis Date Noted  . Lethargy   . Urinary retention   . Leukocytosis   . Essential hypertension   . Confusion 11/19/2018  . Dysphagia, oropharyngeal 11/19/2018  . 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 BPH (benign prostatic hyperplasia) 11/18/2018  . 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 Coronary artery disease involving native coronary artery of native heart without angina pectoris 01/10/2015  . Dyslipidemia 01/10/2015  . Cognitive deficits, late effect of cerebrovascular disease 11/07/2013  . Left homonymous hemianopsia 05/10/2013  . Presence of IVC filter 05/10/2013  . ICH (intracerebral hemorrhage) ([place]) 03/15/2013  . Dry eyes 03/08/2013  . Hyponatremia 03/08/2013  . Headache 03/08/2013  . Acute encephalopathy 03/08/2013  . Pulmonary emboli ([place]) 03/08/2013  . Other and unspecified hyperlipidemia 03/02/2013  . Bradycardia 03/02/2013  . Hypokalemia 03/02/2013  . Hemorrhagic stroke ([place]) 03/01/2013  . Essential hypertension, benign 03/01/2013  . Fever, unspecified 03/01/2013    Expected Discharge Date: Expected Discharge Date: (TBD)  Team Members Present: Physician leading conference: Dr. Alger Simons Social Worker Present: Lennart Pall, LCSW Nurse Present: Dorthula Nettles, RN PT Present: Burnard Bunting, PT OT Present: Cherylynn Ridges, OT SLP Present:  Weston Anna, SLP PPS Coordinator present : Gunnar Fusi, SLP     Current Status/Progress Goal Weekly Team Focus  Medical   posterior circulation infarcts, encephalopathic, very lethargic. not eating,struggling with meds  establish care goals  see above, speak with family   Bowel/Bladder   incontinent of bowel, I&O cath q 4-6 hs LBM 11/29/18  Able to have regular bowel/bladder pattern with mod assst  Assist with bowel/bladder needs PRN   Swallow/Nutrition/ Hydration   Dys. 1 textures with thin liquids, Total A with minimal PO intake  Mod A  increase PO intake   ADL's   Total A now for transfers and all BADL tasks, unable to do anything functional  Min A overall- will likely have to downgrade this week pending goals of care  attention, initiation, self-care retraining, dc planning, following commands, transfers, sit<>stand   Mobility   max-total assist overall for bed mobility and transfers (when pt is not agitated), 3-musketeer gait x15'  min assist overall - will likely downgrade at next weekly  compensating for vision loss, OOB tolerance and command following, initiation of all functional mobility   Communication             Safety/Cognition/ Behavioral Observations  Total A  Max A  arousal, attention, orientation   Pain   no sings/ symptoms of pain noted  <2  Assess and treat pain q shift and as needed   Skin   no skin issue noted  main skin integrity with mod assist  Assess and skin q shift and as needed    Rehab Goals Patient on target to meet rehab goals: No *See Care Plan and progress notes  for long and short-term goals.     Barriers to Discharge  Current Status/Progress Possible Resolutions Date Resolved   Physician    Medical stability               Nursing                  PT                    OT                  SLP                SW                Discharge Planning/Teaching Needs:  Plan upon admission is for pt to return home with wife, son and  potential private duty caregiver providing 24/7 care.  Wife unable to provide any level of care than supervision.  Son works days. Plan to follow up with son/ wife after conference as we may need to change plan to SNF.  Teaching to be planned closer to d/c if decision is home d/c.   Team Discussion:  Ongoing confusion with encephalopathic presentation.  Neurology consulted.  Making no progress with therapies this week.  Actually resisting ST today.  Poor po and requiring suction.  I/O caths.  Total assist with pushing.  May be simply too much care for family to manage at home.  MD to discuss further with pt's son.  Revisions to Treatment Plan:  Changing to qd tx schedule    Continued Need for Acute Rehabilitation Level of Care: The patient requires daily medical management by a physician with specialized training in physical medicine and rehabilitation for the following conditions: Daily direction of a multidisciplinary physical rehabilitation program to ensure safe treatment while eliciting the highest outcome that is of practical value to the patient.: Yes Daily medical management of patient stability for increased activity during participation in an intensive rehabilitation regime.: Yes Daily analysis of laboratory values and/or radiology reports with any subsequent need for medication adjustment of medical intervention for : Mood/behavior problems;Neurological problems   I attest that I was present, lead the team conference, and concur with the assessment and plan of the team.   Lennart Pall 12/01/2018, 3:40 PM    Team conference was held via web/ teleconference due to [place] - 19

## 2018-12-01 NOTE — Progress Notes (Signed)
[facility] PHYSICAL MEDICINE & REHABILITATION PROGRESS NOTE   Subjective/Complaints: Pt in bed. NT trying to feed him. Pt refusing. Is communicating with dysarthric speech  ROS: Limited due to cognitive/behavioral   Objective:   No results found. Recent Labs    11/29/18 1319  WBC 14.5*  HGB 14.7  HCT 42.8  PLT 417*   Recent Labs    11/29/18 1319  NA 131*  K 3.8  CL 100  CO2 20*  GLUCOSE 121*  BUN 10  CREATININE 0.72  CALCIUM 8.8*    Intake/Output Summary (Last 24 hours) at 12/01/2018 0828 Last data filed at 12/01/2018 0740 Gross per 24 hour  Intake 1701.72 ml  Output 2000 ml  Net -298.28 ml     Physical Exam: Vital Signs Blood pressure 127/68, pulse 79, temperature 98.2 F (36.8 C), resp. rate 18, weight 83.9 kg, SpO2 96 %. Constitutional: No distress . Vital signs reviewed. HEENT: EOMI, oral membranes moist. Keeps eyes closed Neck: supple Cardiovascular: RRR without murmur. No JVD    Respiratory: CTA Bilaterally without wheezes or rales. Normal effort    GI: BS +, non-tender, non-distended  Skin: Warm and dry.  Intact. Psych: Normal mood.  Normal behavior. Musc: No edema.  No tenderness. Neurological:oriented to self only. Does respond usually inappropriately to questions. Was able to ID who "[name]" was (son) Moves all 4's Skin: Skin iswarmand dry.  Psychiatric: lying in bed with eyes closed. Agitated easily with physical contact    Assessment/Plan: 1. Functional deficits secondary to bilateral posterior circulation infarcts which require 3+ hours per day of interdisciplinary therapy in a comprehensive inpatient rehab setting.  Physiatrist is providing close team supervision and 24 hour management of active medical problems listed below.  Physiatrist and rehab team continue to assess barriers to discharge/monitor patient progress toward functional and medical goals  Care Tool:  Bathing    Body parts bathed by patient: Face   Body parts bathed  by helper: Front perineal area, Buttocks, Right upper leg, Left upper leg, Left lower leg, Right lower leg     Bathing assist Assist Level: Maximal Assistance - Patient 24 - 49%     Upper Body Dressing/Undressing Upper body dressing   What is the patient wearing?: Pull over shirt    Upper body assist Assist Level: Total Assistance - Patient < 25%    Lower Body Dressing/Undressing Lower body dressing      What is the patient wearing?: Pants     Lower body assist Assist for lower body dressing: Total Assistance - Patient < 25%     Toileting Toileting    Toileting assist Assist for toileting: Dependent - Patient 0%     Transfers Chair/bed transfer  Transfers assist     Chair/bed transfer assist level: 2 Helpers     Locomotion Ambulation   Ambulation assist   Ambulation activity did not occur: Safety/medical concerns  Assist level: 2 helpers Assistive device: No Device Max distance: 15'   Walk 10 feet activity   Assist  Walk 10 feet activity did not occur: Safety/medical concerns  Assist level: 2 helpers     Walk 50 feet activity   Assist Walk 50 feet with 2 turns activity did not occur: Safety/medical concerns         Walk 150 feet activity   Assist Walk 150 feet activity did not occur: Safety/medical concerns         Walk 10 feet on uneven surface  activity   Assist Walk  10 feet on uneven surfaces activity did not occur: Safety/medical concerns         Wheelchair     Assist Will patient use wheelchair at discharge?: No   Wheelchair activity did not occur: N/A         Wheelchair 50 feet with 2 turns activity    Assist    Wheelchair 50 feet with 2 turns activity did not occur: N/A       Wheelchair 150 feet activity     Assist Wheelchair 150 feet activity did not occur: N/A        Medical Problem List and Plan: 1.Functional deficitssecondary to [place] with cortical blindness and confusion  Continue  CIR therapies as tolerated, 15/7  -yesterday, I discussed prognosis at length with pt's son [name]. He is aware of his status. He is d/w mother and reviewing their living will. [name] is healthcare POA. They are considering PEG 2. Antithrombotics: -DVT/anticoagulation:Mechanical:Sequential compression devices, below kneeBilateral lower extremities -antiplatelet therapy: N/A 3. Pain Management:N/A 4. Mood:LCSW to follow for evaluation and support when appropriate -antipsychotic agents: off scheduled seroquel  -begin trial of ritalin--definitely more alert (relatively speaking) today   -increaes to 10mg    -consider amantadine as well 5. Neuropsych: This patientis notcapable of making decisions on hisown behalf. 6. Skin/Wound Care:Routine pressure relief measures 7. Fluids/Electrolytes/Nutrition: continue to push po, full supervision with meals  -supplements between meals as possible  -added megace   -consider PEG pending what clincal progress he makes, family decision  -continue D5 1/2NS for now 8.YKD:XIPJASNK Lopressor.    Amlodipine increased to 7.5 on 8/12   -consider catapres d/t inconsistent swallowing  -for now BP's better Vitals:   11/30/18 1943 12/01/18 0747  BP: (!) 155/81 127/68  Pulse: 93 79  Resp: 18   Temp: 98.2 F (36.8 C)   SpO2: 96%    9.Agitation: depakote and seroquel stopped 10.GERD: On Protonix 11.Leukocytosis: Monitor for signs of infection.  Recheck CBC in a.m.  WBCs 13.9 on 8/12---14.7 8/14  -in reviewing chart, leukocytosis is chronic with neutrophilia---consider outpt h/o follow up. Don't think it's relevant at present 12. Urinary retention: Continue Flomax.   -UA negative, UCX negative x 2.   - mostly d/t poor cognition/awareness 13. Lethargy: likely d/t multiple PCA infarcts, associated edema  -CXR and CT without acute changes  -ucx negative x 2  empiric ceftriaxone dc'ed  -stopped vpa  -supportive  measures  - continue IVF hs-- d5/ 1/2NS        LOS: 12 days A FACE TO [place] 12/01/2018, 8:28 AM

## 2018-12-01 NOTE — Progress Notes (Signed)
Occupational Therapy Session Note  Patient Details  Name: TEDDY REBSTOCK MRN: [id number] Date of Birth: 1935-12-01  Today's Date: 12/01/2018 OT Individual Time: 1130-1145 OT Individual Time Calculation (min): 15 min    Short Term Goals: Week 1:    Week 2:  OT Short Term Goal 1 (Week 2): Pt will complete 1 step of UB dressing task with max multimodal cues OT Short Term Goal 2 (Week 2): Pt will tolerate sitting EOB for 5 mins in preparation for BADL task OT Short Term Goal 3 (Week 2): Pt will complete functional transfer with LRAD and Max A of 1 person consistently  Skilled Therapeutic Interventions/Progress Updates:    Pt received in bed, he was verbally responding but not opening eyes.  Pt laying on side in bed.  Attempted rolling pt to stimulate him, but he resisted and groaned as though it was painful for him.  Attempted ROM of UE but pt resisting being moved.  Washed pt's face and combed hair, but he did not like the feeling of the cool water.  Pt repositioned with pillows between legs and under top arm for better back alignment.  Pt in bed with all needs met.  Therapy Documentation Precautions:  Precautions Precautions: Fall Precaution Comments: cortical blindness Restrictions Weight Bearing Restrictions: No General: General OT Amount of Missed Time: 15 Minutes Pain:  grimacing with movement, no pain at rest    Therapy/Group: Individual Therapy  [place] 12/01/2018, 1:04 PM

## 2018-12-01 NOTE — Progress Notes (Addendum)
Physical Therapy Session Note  Patient Details  Name: Walter Larsen MRN: [id number] Date of Birth: Mar 04, 1936  Today's Date: 12/01/2018 PT Individual Time: 1749-4496 AND 1115-1130 PT Individual Time Calculation (min): 18 min AND 15 min  Short Term Goals: Week 2:  PT Short Term Goal 1 (Week 2): Pt will transfer bed<>chair w/ max assist x1 consistently PT Short Term Goal 2 (Week 2): Pt will initiate functional tasks w/ mod cues 50% of the time PT Short Term Goal 3 (Week 2): Pt will perform bed mobility w/ mod assist PT Short Term Goal 4 (Week 2): Pt will ambulate 10' w/ max assist x1  Skilled Therapeutic Interventions/Progress Updates:   Session 1:  Pt in supine and appears alert, however only oriented to self, eyes intermittently open during session. No evidence or c/o pain. Placed pt in full upright position in bed. Attempted to encourage pt in self-feeding w/ taking a sip of drink. Pt would confirm he wanted something to drink, but would then shake his head when straw was brought to mouth and swipe at therapist's hand. Agitation decreased when therapist was holding pt's R hand, however would still not take a sip. Tried multiple different things including magic cup, juice, and ensure. Pt swiping at therapist's hand w/ everything and shaking head. Pt w/ incoherent verbalizations throughout, he would also fall asleep very quickly in full upright but was easy to arouse. Assisted in decreasing agitation while RN took BP, pt does decrease his combativeness when someone is holding his hand. Missed 12 min of skilled PT 2/2 agitation/refusal to participate.   Session 2:  Pt in supine, awake but eyes closed and making incoherent verbalizations. Pt's R mitt noted to be soiled of bowel. Pt incontinent of bowel in brief. R/L rolling dependently, tactile and verbal cues to see if pt can perform any aspect of task himself. Pericare and brief management in supine. Ongoing attempts at increasing arousal, pt  combative w/ any attempts at getting OOB. Ended session in L sidelying, RN agreeable to leave mitt off until new one can be ordered for pt. All needs in reach.   Therapy Documentation Precautions:  Precautions Precautions: Fall Precaution Comments: cortical blindness Restrictions Weight Bearing Restrictions: No General: PT Amount of Missed Time (min): 12 Minutes PT Missed Treatment Reason: Increased agitation;Patient unwilling to participate Vital Signs: Therapy Vitals Pulse Rate: 79 BP: 127/68 Patient Position (if appropriate): Lying Pain: Pain Assessment Pain Scale: Faces Faces Pain Scale: No hurt  Therapy/Group: Individual Therapy  Basilio Meadow K Aquanetta Schwarz 12/01/2018, 7:59 AM

## 2018-12-01 NOTE — Plan of Care (Signed)
  Problem: RH BLADDER ELIMINATION Goal: RH STG MANAGE BLADDER WITH ASSISTANCE Description: STG Manage Bladder With Assistance. Mod Outcome: Progressing   Problem: RH SKIN INTEGRITY Goal: RH STG MAINTAIN SKIN INTEGRITY WITH ASSISTANCE Description: STG Maintain Skin Integrity With Assistance. Mod Outcome: Progressing   Problem: RH SAFETY Goal: RH STG ADHERE TO SAFETY PRECAUTIONS W/ASSISTANCE/DEVICE Description: STG Adhere to Safety Precautions With Assistance/Device. Max Outcome: Progressing   Problem: RH COGNITION-NURSING Goal: RH STG USES MEMORY AIDS/STRATEGIES W/ASSIST TO PROBLEM SOLVE Description: STG Uses Memory Aids/Strategies With Assistance to Problem Solve. Mod Outcome: Progressing

## 2018-12-01 NOTE — Progress Notes (Signed)
Speech Language Pathology Daily Session Note  Patient Details  Name: Walter Larsen MRN: [id number] Date of Birth: 1936-01-04  Today's Date: 12/01/2018 SLP Individual Time: 1440-1450 SLP Individual Time Calculation (min): 10 min and Today's Date: 12/01/2018 SLP Missed Time: 20 Minutes Missed Time Reason: Patient fatigue  Short Term Goals: Week 2: SLP Short Term Goal 1 (Week 2): Pt will consume current diet with minimal overt s/s of aspiration and Mod A cues for use of compensatory swallow strategies. SLP Short Term Goal 2 (Week 2): Pt will follow 1 step basic directions in 2 out of 10 opportunities with Max A multimodal cues. SLP Short Term Goal 3 (Week 2): Pt will sustain attention to basic familiar task for ~ 1 minute with Max A multimodal cues.  Skilled Therapeutic Interventions: Skilled treatment session focused on cognitive goals. This was this clinicians second attempt to see patient due to lethargy. SLP facilitated session by providing total hand over hand assist for patient to initiate functional tasks. Patient resistive to assistance and swatting at clinician. Patient with language of confusion and decreased intelligibility requiring total A for orientation to place and situation when given choices from a field of 2. Patient falling asleep intermittently throughout session, therefore, patient missed remaining 20 minutes of session. Patient left supine in bed with alarm on and all needs within reach. Continue with current plan of care.      Pain No/Denies Pain   Therapy/Group: Individual Therapy  Deiona Hooper 12/01/2018, 3:03 PM

## 2018-12-02 ENCOUNTER — Inpatient Hospital Stay (HOSPITAL_COMMUNITY): Payer: Medicare HMO | Admitting: Speech Pathology

## 2018-12-02 ENCOUNTER — Inpatient Hospital Stay (HOSPITAL_COMMUNITY): Payer: Medicare HMO | Admitting: Occupational Therapy

## 2018-12-02 ENCOUNTER — Inpatient Hospital Stay (HOSPITAL_COMMUNITY): Payer: Medicare HMO | Admitting: Physical Therapy

## 2018-12-02 DIAGNOSIS — Z515 Encounter for palliative care: Secondary | ICD-10-CM

## 2018-12-02 NOTE — Progress Notes (Signed)
Patient will not swallowing medications this am. He swats at this RN and is very apologetic when he does swat at this RN. This RN was not able to administer the AM medications.

## 2018-12-02 NOTE — Progress Notes (Signed)
Occupational Therapy Session Note  Patient Details  Name: Walter Larsen MRN: [id number] Date of Birth: 12/24/1935  Today's Date: 12/02/2018 OT Individual Time: 1402-1430 OT Individual Time Calculation (min): 28 min   Short Term Goals: Week 2:  OT Short Term Goal 1 (Week 2): Pt will complete 1 step of UB dressing task with max multimodal cues OT Short Term Goal 2 (Week 2): Pt will tolerate sitting EOB for 5 mins in preparation for BADL task OT Short Term Goal 3 (Week 2): Pt will complete functional transfer with LRAD and Max A of 1 person consistently  Skilled Therapeutic Interventions/Progress Updates:    Pt greeted semi-reclined in bed, seemingly more alert today with eyes open. OT dependently transferred pt to sitting EOB with pt groaning like LEs were painful when coming to sitting. Once EOB, pt tolerated sitting with hands on his knees and supervision/CGA. OT tried to remove L hand mit, but he swatted at therapist and fist was clenched to the inside of glove. OT tried to engage pt in facewashing task but was unable to participate functionally. Pt spitting on the floor and into hand mit while seated EOB. Swatted at therapist when OT tried to suction mouth. Pt tolerated sitting EOB for 12 minutes. Total A to transfer back to bed and bed placed in trendelenburg to scoot pt up in bed. Pt then fell asleep and was left semi-reclined in bed with bed alarm on.  Therapy Documentation Precautions:  Precautions Precautions: Fall Precaution Comments: cortical blindness Restrictions Weight Bearing Restrictions: No Pain:   pt unable to state pain. Pt did groan when moved to EOB. OT repositioned pt back in bed for comfort at end of session.   Therapy/Group: Individual Therapy  Valma Cava 12/02/2018, 2:27 PM

## 2018-12-02 NOTE — Progress Notes (Signed)
Physical Therapy Session Note  Patient Details  Name: Walter Larsen MRN: [id number] Date of Birth: Dec 26, 1935  Today's Date: 12/02/2018 PT Individual Time: 1050-1130 PT Individual Time Calculation (min): 40 min   Short Term Goals: Week 2:  PT Short Term Goal 1 (Week 2): Pt will transfer bed<>chair w/ max assist x1 consistently PT Short Term Goal 2 (Week 2): Pt will initiate functional tasks w/ mod cues 50% of the time PT Short Term Goal 3 (Week 2): Pt will perform bed mobility w/ mod assist PT Short Term Goal 4 (Week 2): Pt will ambulate 10' w/ max assist x1  Skilled Therapeutic Interventions/Progress Updates:   Pt awake in supine, UE motor perseverations and incoherent verbalizations. Pt did eyes opens and turn towards therapist's voice. He appeared agreeable to sit up, more so than during previous attempts. Dependent supine>sit and mod-max static sitting balance w/ heavy L lateral lean. Pt making appropriate UE attempts at holding himself up at EOB however significantly impaired midline orientation. Verbal, tactile, and manual cues to orient to environment and upright sitting x45-60 sec. Pt w/ increasing agitation in sitting, returned to supine total assist. Agitation improved once in L sidelying, pt stated "my butt hurts" and confirmed it felt better laying on his side. Family brought in pt's electric razor from home, pt agreeable to letting this therapist shave his face for him. Worked on maintaining calm state while therapist performed task w/ total assist. Pt would appropriately hold razor in hand and stated he felt it there, but would not initiate any functional movement with it. Shaving face aimed at increasing alertness and awareness of environment, however appeared to also have a calming effect. Pt batting at therapist's hands when attempting to shave upper lip, left mustache intact. Overall, pt w/ improved tolerance to being assisted by this therapist this session. Ended session in L  sidelying, all needs met and UE mitts donned.   Therapy Documentation Precautions:  Precautions Precautions: Fall Precaution Comments: cortical blindness Restrictions Weight Bearing Restrictions: No Pain: Pain Assessment Pain Scale: Faces Faces Pain Scale: Hurts a little bit  Therapy/Group: Individual Therapy  Cayci Mcnabb Clent Demark 12/02/2018, 11:53 AM

## 2018-12-02 NOTE — Progress Notes (Signed)
Social Work Patient ID: Walter Larsen, male   DOB: 1935/05/01, 83 y.o.   MRN: [id number]  Spoke with pt's son, Walter Larsen, today to review team conference and follow up after his discussions with Dr. Naaman Larsen.  He is agreeable with Palliative Care consult and reports his sister should be here from [place] today and they hope to meet by tomorrow.  Again, reviewed concerns over level of physical assistance needed and whether family can provide this at home vs. Attempt to place pt at SNF.  He plans to discuss this with Palliative team and is trying to locate if pt has a living will to further support any decisions made.  Will follow up again tomorrow with son.  Walter Franzoni, LCSW

## 2018-12-02 NOTE — Progress Notes (Signed)
Speech Language Pathology Daily Session Note  Patient Details  Name: DHRUVA ORNDOFF MRN: [id number] Date of Birth: [birth year]  Today's Date: 12/02/2018 SLP Individual Time: 1145-1200 SLP Individual Time Calculation (min): 15 min  Short Term Goals: Week 2: SLP Short Term Goal 1 (Week 2): Pt will consume current diet with minimal overt s/s of aspiration and Mod A cues for use of compensatory swallow strategies. SLP Short Term Goal 2 (Week 2): Pt will follow 1 step basic directions in 2 out of 10 opportunities with Max A multimodal cues. SLP Short Term Goal 3 (Week 2): Pt will sustain attention to basic familiar task for ~ 1 minute with Max A multimodal cues.  Skilled Therapeutic Interventions: Skilled treatment session focused on cognitive goals. Upon arrival, patient was being cathed by nursing and required tactile cues for relaxation and calming throughout. Patient with continued language of confusion but demonstrated improved appropriate responses to SLP. Patient continues to require hand over hand total A to complete/initiate any task and declined all trials, even ice chips by keeping his mouth closed. Patient became lethargic during session with decreased verbal responses, therefore, patient missed remaining 15 mins of session. Patient left supine in bed with alarm on and all needs within reach. Continue with current plan of care.      Pain Pain Assessment Pain Scale: Faces Faces Pain Scale: Hurts a little bit  Therapy/Group: Individual Therapy  Marynell Bies 12/02/2018, 12:18 PM

## 2018-12-02 NOTE — Progress Notes (Signed)
[facility] PHYSICAL MEDICINE & REHABILITATION PROGRESS NOTE   Subjective/Complaints: Pt in bed. NT trying to feed him. Pt refusing. Is communicating with dysarthric speech  ROS: Limited due to cognitive/behavioral   Objective:   No results found. Recent Labs    11/29/18 1319  WBC 14.5*  HGB 14.7  HCT 42.8  PLT 417*   Recent Labs    11/29/18 1319  NA 131*  K 3.8  CL 100  CO2 20*  GLUCOSE 121*  BUN 10  CREATININE 0.72  CALCIUM 8.8*    Intake/Output Summary (Last 24 hours) at 12/02/2018 1028 Last data filed at 12/02/2018 0430 Gross per 24 hour  Intake 5 ml  Output 1450 ml  Net -1445 ml     Physical Exam: Vital Signs Blood pressure (!) 155/90, pulse 97, temperature 98.6 F (37 C), resp. rate 18, weight 83.9 kg, SpO2 99 %. Constitutional: No distress . Vital signs reviewed. HEENT: EOMI, oral membranes moist. Keeps eyes closed Neck: supple Cardiovascular: RRR without murmur. No JVD    Respiratory: CTA Bilaterally without wheezes or rales. Normal effort    GI: BS +, non-tender, non-distended  Skin: Warm and dry.  Intact. Psych: Normal mood.  Normal behavior. Musc: No edema.  No tenderness. Neurological:oriented to self only. Does respond usually inappropriately to questions. Was able to ID who "Walter Larsen" was (son) Moves all 4's Skin: Skin iswarmand dry.  Psychiatric: lying in bed with eyes closed. Agitated easily with physical contact    Assessment/Plan: 1. Functional deficits secondary to bilateral posterior circulation infarcts which require 3+ hours per day of interdisciplinary therapy in a comprehensive inpatient rehab setting.  Physiatrist is providing close team supervision and 24 hour management of active medical problems listed below.  Physiatrist and rehab team continue to assess barriers to discharge/monitor patient progress toward functional and medical goals  Care Tool:  Bathing    Body parts bathed by patient: Face   Body parts bathed by  helper: Front perineal area, Buttocks, Right upper leg, Left upper leg, Left lower leg, Right lower leg     Bathing assist Assist Level: Maximal Assistance - Patient 24 - 49%     Upper Body Dressing/Undressing Upper body dressing   What is the patient wearing?: Pull over shirt    Upper body assist Assist Level: Total Assistance - Patient < 25%    Lower Body Dressing/Undressing Lower body dressing      What is the patient wearing?: Pants     Lower body assist Assist for lower body dressing: Total Assistance - Patient < 25%     Toileting Toileting    Toileting assist Assist for toileting: Dependent - Patient 0%     Transfers Chair/bed transfer  Transfers assist     Chair/bed transfer assist level: 2 Helpers     Locomotion Ambulation   Ambulation assist   Ambulation activity did not occur: Safety/medical concerns  Assist level: 2 helpers Assistive device: No Device Max distance: 15'   Walk 10 feet activity   Assist  Walk 10 feet activity did not occur: Safety/medical concerns  Assist level: 2 helpers     Walk 50 feet activity   Assist Walk 50 feet with 2 turns activity did not occur: Safety/medical concerns         Walk 150 feet activity   Assist Walk 150 feet activity did not occur: Safety/medical concerns         Walk 10 feet on uneven surface  activity   Assist  Walk 10 feet on uneven surfaces activity did not occur: Safety/medical concerns         Wheelchair     Assist Will patient use wheelchair at discharge?: No   Wheelchair activity did not occur: N/A         Wheelchair 50 feet with 2 turns activity    Assist    Wheelchair 50 feet with 2 turns activity did not occur: N/A       Wheelchair 150 feet activity     Assist Wheelchair 150 feet activity did not occur: N/A        Medical Problem List and Plan: 1.Functional deficitssecondary to [place] with cortical blindness and confusion  Continue CIR  therapies as tolerated, 15/7  -I have discussed prognosis at length with pt's son Walter Larsen. He is aware of his status. I spoke with him today again and they haven't located a living will/advanced directives. Sister is coming in from [place]. Nobody is sure what direction they want to at this time. SW examining dispo options. Spoke to Walter Larsen about involving palliative care in helping them make an informed decision moving forward, and he was all for that. Will consult Palliative Care today. 2. Antithrombotics: -DVT/anticoagulation:Mechanical:Sequential compression devices, below kneeBilateral lower extremities -antiplatelet therapy: N/A 3. Pain Management:N/A 4. Mood:LCSW to follow for evaluation and support when appropriate -antipsychotic agents: off scheduled seroquel  -continue trial of ritalin--definitely more alert. Still quite confused though   -increased to 10mg    -consider amantadine as well 5. Neuropsych: This patientis notcapable of making decisions on hisown behalf. 6. Skin/Wound Care:Routine pressure relief measures 7. Fluids/Electrolytes/Nutrition: continue to push po, full supervision with meals  -supplements between meals as possible  -added megace without much benefit--mostly cognitive---will dc   -consider PEG pending what clincal progress he makes, family decision  -continue D5 1/2NS for now  -prealbumin only 13 8.BZJ:IRCVELFY Lopressor.    Amlodipine increased to 7.5 on 8/12   -consider catapres d/t inconsistent swallowing  -bp's borderline Vitals:   12/01/18 2015 12/02/18 0416  BP: (!) 156/92 (!) 155/90  Pulse: 85 97  Resp: 18 18  Temp: 97.9 F (36.6 C) 98.6 F (37 C)  SpO2: 99%    9.Agitation: depakote and seroquel stopped 10.GERD: On Protonix 11.Leukocytosis: Monitor for signs of infection.  Recheck CBC in a.m.  WBCs 13.9 on 8/12---14.7 8/14  -in reviewing chart, leukocytosis is chronic with neutrophilia---consider  outpt h/o follow up. Don't think it's relevant at present 12. Urinary retention: Continue Flomax.   -UA negative, UCX negative x 2.   - mostly d/t poor cognition/awareness  -continue I/O caths as needed 13. Lethargy: likely d/t multiple PCA infarcts, associated edema  -CXR and CT without acute changes  -ucx negative x 2  empiric ceftriaxone dc'ed  -stopped vpa  -supportive measures  -see discussion above        LOS: 13 days A FACE TO [place] 12/02/2018, 10:28 AM

## 2018-12-03 ENCOUNTER — Inpatient Hospital Stay (HOSPITAL_COMMUNITY): Payer: Medicare HMO | Admitting: Occupational Therapy

## 2018-12-03 ENCOUNTER — Inpatient Hospital Stay (HOSPITAL_COMMUNITY): Payer: Medicare HMO | Admitting: Speech Pathology

## 2018-12-03 ENCOUNTER — Inpatient Hospital Stay (HOSPITAL_COMMUNITY): Payer: Medicare HMO | Admitting: Physical Therapy

## 2018-12-03 DIAGNOSIS — Z515 Encounter for palliative care: Secondary | ICD-10-CM

## 2018-12-03 NOTE — Consult Note (Signed)
Consultation Note Date: 12/03/2018   Patient Name: Walter Larsen  DOB: [birth year]  MRN: [id number]  Age / Sex: 83 y.o., male  PCP: Greig Right, MD Referring Physician: Meredith Staggers, MD  Reason for Consultation: Establishing goals of care  HPI/Patient Profile: 83 y.o. male  with past medical history of CAD, [place] in 2014, and BLE DVTs admitted on 11/19/2018 to CIR following hospital admission 11/14/18 d/t acute onset of total blindness. MRI revealed ICH. Patient with confusion and agitation. Patient experiencing functional and nutritional decline in CIR. PMT consulted for [place].   Clinical Assessment and Goals of Care: Thorough review of chart and discussion with RN. RN tells me that patient remains significantly confused/agitated at times. Completely blind. Lethargic. Only PO intake is for medication administration. Per review of PT and OT notes - patient refusing to participate and function is declining. Patient does not follow commands.   Chart review also reveals that patient has a living will (not scanned into Epic) and family is looking for it - not located it yet.   RN shares that family has not been at bedside much. Per review of notes, appears that most communication is with patient's son, Walter Larsen.   Family needs discussion regarding multiple issues - lack of nutritional intake (PEG placement?), disposition to home vs SNF, goals of care, and code status.   I attempted to call son, Walter Larsen - no answer - voicemail with callback number left with no callback yet. Will request provider follow up.   Per chart review, patient likely eligible for hospice services at home if this is in line with goals of care.   Primary Decision Maker NEXT OF KIN - per demographics patient has a wife [name] - however, it appears most discussion is with son, Walter Larsen - unsure if wife is able to participate in decision making?   SUMMARY OF RECOMMENDATIONS   - no answer  or return call from family - needs [place] conversation regarding ?PEG placement, disposition, code status, ?hospice - PMT to follow up  Code Status/Advance Care Planning:  Full code  Prognosis:   Unable to determine - poor prognosis r/t very poor PO intake - unclear plan for nutrition - functional decline  Discharge Planning: To Be Determined      Primary Diagnoses: Present on Admission: . ICH (intracerebral hemorrhage) ([place])   I have reviewed the medical record, interviewed the patient and family, and examined the patient. The following aspects are pertinent.  Past Medical History:  Diagnosis Date  . Coronary artery disease    3 stents  . ICH (intracerebral hemorrhage) ([place]) 2014   Social History   Socioeconomic History  . Marital status: Married    Spouse name: Not on file  . Number of children: 2  . Years of education: 12th  . Highest education level: Not on file  Occupational History  . Occupation: retired  Scientific laboratory technician  . Financial resource strain: Not on file  . Food insecurity    Worry: Not on file    Inability: Not on file  . Transportation needs    Medical: Not on file    Non-medical: Not on file  Tobacco Use  . Smoking status: Never Smoker  . Smokeless tobacco: Never Used  Substance and Sexual Activity  . Alcohol use: No  . Drug use: No  . Sexual activity: Not on file  Lifestyle  . Physical activity    Days per week: Not on file    Minutes per session:  Not on file  . Stress: Not on file  Relationships  . Social Herbalist on phone: Not on file    Gets together: Not on file    Attends religious service: Not on file    Active member of club or organization: Not on file    Attends meetings of clubs or organizations: Not on file    Relationship status: Not on file  Other Topics Concern  . Not on file  Social History Narrative  . Not on file   Family History  Problem Relation Age of Onset  . Hypertension Mother   . Hypertension  Father    Scheduled Meds: . amLODipine  7.5 mg Oral Daily  . feeding supplement (ENSURE ENLIVE)  237 mL Oral BID BM  . fenofibrate  160 mg Oral Daily  . mouth rinse  15 mL Mouth Rinse BID  . methylphenidate  10 mg Oral BID WC  . metoprolol tartrate  25 mg Oral BID  . pantoprazole sodium  40 mg Per Tube QHS  . potassium chloride  20 mEq Oral BID  . senna-docusate  1 tablet Oral BID  . tamsulosin  0.4 mg Oral QPC breakfast   Continuous Infusions: . dextrose 5 % and 0.45% NaCl 75 mL/hr at 12/03/18 0850   PRN Meds:.acetaminophen, alum & mag hydroxide-simeth, bisacodyl, diphenhydrAMINE, guaiFENesin-dextromethorphan, lidocaine, polyethylene glycol, prochlorperazine **OR** prochlorperazine **OR** prochlorperazine, sodium phosphate Allergies  Allergen Reactions  . Ativan [Lorazepam] Other (See Comments)    confusion    Vital Signs: BP 136/74 (BP Location: Left Arm)   Pulse 86   Temp 98.1 F (36.7 C) (Oral)   Resp 18   Ht 6' (1.829 m)   Wt 83.9 kg   SpO2 100%   BMI 25.09 kg/m  Pain Scale: Faces   Pain Score: 0-No pain   SpO2: SpO2: 100 % O2 Device:SpO2: 100 % O2 Flow Rate: .   IO: Intake/output summary:   Intake/Output Summary (Last 24 hours) at 12/03/2018 1552 Last data filed at 12/03/2018 1424 Gross per 24 hour  Intake 503.57 ml  Output 1550 ml  Net -1046.43 ml    LBM: Last BM Date: 12/03/18 Baseline Weight: Weight: 83.9 kg Most recent weight: Weight: 83.9 kg     Palliative Assessment/Data: PPS 20%    The above conversation was completed via telephone due to the visitor restrictions during the COVID-19 pandemic. Thorough chart review and discussion with necessary members of the care team was completed as part of assessment. All issues were discussed and addressed but no physical exam was performed  Time Total: 30 minutes Greater than 50%  of this time was spent counseling and coordinating care related to the above assessment and plan.  Juel Burrow, DNP,  AGNP-C Palliative Medicine Team [phone] Pager: [phone]

## 2018-12-03 NOTE — Plan of Care (Signed)
Problem: RH Balance Goal: LTG Patient will maintain dynamic sitting balance (PT) Description: LTG:  Patient will maintain dynamic sitting balance with assistance during mobility activities (PT) Flowsheets (Taken 12/03/2018 0756) LTG: Pt will maintain dynamic sitting balance during mobility activities with:: (downgraded 8/21 due to lack of progress - AAT) Moderate Assistance - Patient 50 - 74% Note: Downgraded 8/21 due to lack of progress  Goal: LTG Patient will maintain dynamic standing balance (PT) Description: LTG:  Patient will maintain dynamic standing balance with assistance during mobility activities (PT) Outcome: Not Applicable Flowsheets (Taken 12/03/2018 0756) LTG: Pt will maintain dynamic standing balance during mobility activities with:: (discontinued 8/21 due to lack of progress- AAT) -- Note: Discontinued 8/21 due to lack of progress   Problem: RH Bed Mobility Goal: LTG Patient will perform bed mobility with assist (PT) Description: LTG: Patient will perform bed mobility with assistance, with/without cues (PT). Flowsheets (Taken 12/03/2018 0756) LTG: Pt will perform bed mobility with assistance level of: (downgraded 8/21 due to lack of progress - AAT) Maximal Assistance - Patient 25 - 49% Note: Downgraded 8/21 due to lack of progress    Problem: RH Bed to Chair Transfers Goal: LTG Patient will perform bed/chair transfers w/assist (PT) Description: LTG: Patient will perform bed to chair transfers with assistance (PT). Flowsheets (Taken 12/03/2018 0756) LTG: Pt will perform Bed to Chair Transfers with assistance level: (Downgraded 8/21 due to lack of progress - AAT) Maximal Assistance - Patient 25 - 49% Note: Downgraded 8/21 due to lack of progress    Problem: RH Car Transfers Goal: LTG Patient will perform car transfers with assist (PT) Description: LTG: Patient will perform car transfers with assistance (PT). Flowsheets (Taken 12/03/2018 0756) LTG: Pt will perform car  transfers with assist:: (Downgraded 8/21 due to lack of progress - AAT) Maximal Assistance - Patient 25 - 49% Note: Downgraded 8/21 due to lack of progress    Problem: RH Furniture Transfers Goal: LTG Patient will perform furniture transfers w/assist (OT/PT) Description: LTG: Patient will perform furniture transfers  with assistance (OT/PT). Outcome: Not Applicable Flowsheets (Taken 12/03/2018 0756) LTG: Pt will perform furniture transfers with assist:: (Discontinued 8/21 due to lack of progress - AAT) -- Note: Discontinued 8/21 due to lack of progress    Problem: RH Ambulation Goal: LTG Patient will ambulate in controlled environment (PT) Description: LTG: Patient will ambulate in a controlled environment, # of feet with assistance (PT). Outcome: Not Applicable Flowsheets (Taken 12/03/2018 0756) LTG: Pt will ambulate in controlled environ  assist needed:: (Discontinued 8/21 due to lack of progress - AAT) -- Note: Discontinued 8/21 due to lack of progress  Goal: LTG Patient will ambulate in home environment (PT) Description: LTG: Patient will ambulate in home environment, # of feet with assistance (PT). Outcome: Not Applicable Flowsheets (Taken 12/03/2018 0756) LTG: Pt will ambulate in home environ  assist needed:: (Discontinued 8/21 due to lack of progress - AAT) -- Note: Discontinued 8/21 due to lack of progress    Problem: RH Memory Goal: LTG Patient will demonstrate ability for day to day recall/carry over during activities of daily living with assistance level (PT) Description: LTG:  Patient will demonstrate ability for day to day recall/carry over during activities of daily living with assistance level (PT). Outcome: Not Applicable Flowsheets (Taken 12/03/2018 0756) LTG:  Patient will demonstrate ability for day to day recall/carry over during activities of daily living with assistance level (PT): (Discontinued 8/21 due to lack of progress - AAT) -- Note: Discontinued 8/21 due  to  lack of progress    Problem: RH Attention Goal: LTG Patient will demonstrate this level of attention during functional activites (PT) Description: LTG:  Patient will demonstrate this level of attention during functional activites (PT) Flowsheets Taken 12/03/2018 0756 LTG: Patient will demonstrate attention during functional mobility with assistance of: (Downgraded 8/21 due to lack of progress - AAT) Maximal Assistance - Patient 25 - 49% Taken 11/20/2018 1254 Patient will demonstrate this level of attention during functional activites: Sustained Patient will demonstrate above attention level in the following environment: Home Note: Downgraded 8/21 due to lack of progress

## 2018-12-03 NOTE — Progress Notes (Signed)
Speech Language Pathology Weekly Progress and Session Note  Patient Details  Name: Walter Larsen MRN: [id number] Date of Birth: 03-07-1936  Beginning of progress report period: November 26, 2018 End of progress report period: December 03, 2018  Today's Date: 12/03/2018 SLP Individual Time: 1325-1350 SLP Individual Time Calculation (min): 25 min  Short Term Goals: Week 2: SLP Short Term Goal 1 (Week 2): Pt will consume current diet with minimal overt s/s of aspiration and Mod A cues for use of compensatory swallow strategies. SLP Short Term Goal 1 - Progress (Week 2): Not met SLP Short Term Goal 2 (Week 2): Pt will follow 1 step basic directions in 2 out of 10 opportunities with Max A multimodal cues. SLP Short Term Goal 2 - Progress (Week 2): Not met SLP Short Term Goal 3 (Week 2): Pt will sustain attention to basic familiar task for ~ 1 minute with Max A multimodal cues. SLP Short Term Goal 3 - Progress (Week 2): Not met    New Short Term Goals: Week 3: SLP Short Term Goal 1 (Week 3): Pt will consume current diet with minimal overt s/s of aspiration and Max A cues for use of compensatory swallow strategies. SLP Short Term Goal 2 (Week 3): Pt will follow 1 step basic directions in 2 out of 10 opportunities with Max A multimodal cues. SLP Short Term Goal 3 (Week 3): Pt will sustain attention to basic familiar task for ~ 1 minute with Max A multimodal cues.  Weekly Progress Updates: Patient has not made any progress and has not met any STGs this reporting period. Currently, patient is consuming Dys. 1 textures with thin liquids with total A. However, patient is declining most solids and liquids due to cognitive impairments and has had very minimal PO intake. Patient continues to demonstrate severe language of confusion with decreased speech intelligibility and requires total A to complete basic and familiar tasks safely in regards to following commands and focused attention. Family education  ongoing and a palliative care consult is now in place. Patient would benefit from continued skilled SLP intervention to maximize swallowing and cognitive functioning prior to discharge.      Intensity: Minumum of 1-2 x/day, 30 to 90 minutes Frequency: 3 to 5 out of 7 days Duration/Length of Stay: 2 weeks Treatment/Interventions: Cognitive remediation/compensation;Environmental controls;Patient/family education;Internal/external aids;Dysphagia/aspiration precaution training;Cueing hierarchy;Functional tasks;Therapeutic Activities   Daily Session  Skilled Therapeutic Interventions: Skilled treatment session focused on cognitive goals. Upon arrival, patient was awake and more responsive to questions although his answers did not correspond to the question. Patient swatting at patient and turning head to avoid PO intake of solids or liquids. However, minimal oral care provided via the suction toothbrush. Patient became lethargic in the middle of session with decreased alertness, therefore, patient missed remaining 20 minutes of session. Patient left upright in bed with alarm on and all needs within reach. Continue with current plan of care.      Pain No/Denies Pain   Therapy/Group: Individual Therapy  Ariane Ditullio 12/03/2018, 6:40 AM

## 2018-12-03 NOTE — Plan of Care (Signed)
  Problem: RH Balance Goal: LTG Patient will maintain dynamic standing with ADLs (OT) Description: LTG:  Patient will maintain dynamic standing balance with assist during activities of daily living (OT)  Outcome: Not Applicable Flowsheets (Taken 12/03/2018 1300) LTG: Pt will maintain dynamic standing balance during ADLs with: (d/c 8/21) -- Note: D/c Goal 8/21 2/2 lack of progress- ESD   Problem: Sit to Stand Goal: LTG:  Patient will perform sit to stand in prep for activites of daily living with assistance level (OT) Description: LTG:  Patient will perform sit to stand in prep for activites of daily living with assistance level (OT) Outcome: Not Applicable Flowsheets (Taken 12/03/2018 1300) LTG: PT will perform sit to stand in prep for activites of daily living with assistance level: (dc goal 8/21) -- Note: D/c Goal 8/21 2/2 lack of progress- ESD   Problem: RH Dressing Goal: LTG Patient will perform lower body dressing w/assist (OT) Description: LTG: Patient will perform lower body dressing with assist, with/without cues in positioning using equipment (OT) Outcome: Not Applicable Flowsheets (Taken 12/03/2018 1300) LTG: Pt will perform lower body dressing with assistance level of: (dc goal 8/21) -- Note: D/c goal  2/2 lack of progress-8/21- ESD   Problem: RH Toileting Goal: LTG Patient will perform toileting task (3/3 steps) with assistance level (OT) Description: LTG: Patient will perform toileting task (3/3 steps) with assistance level (OT)  Outcome: Not Applicable Flowsheets (Taken 12/03/2018 1300) LTG: Pt will perform toileting task (3/3 steps) with assistance level: (dc goal 8/21) -- Note: Dc goal 2/2 lack of progress-8/21- ESD   Problem: RH Toilet Transfers Goal: LTG Patient will perform toilet transfers w/assist (OT) Description: LTG: Patient will perform toilet transfers with assist, with/without cues using equipment (OT) Outcome: Not Applicable Flowsheets (Taken 12/03/2018  1300) LTG: Pt will perform toilet transfers with assistance level of: (dc goal 8/21) -- Note: Dc goal 2/2 lack of progress-8/21- ESD   Problem: RH Tub/Shower Transfers Goal: LTG Patient will perform tub/shower transfers w/assist (OT) Description: LTG: Patient will perform tub/shower transfers with assist, with/without cues using equipment (OT) Outcome: Not Applicable Flowsheets (Taken 12/03/2018 1300) LTG: Pt will perform tub/shower stall transfers with assistance level of: (dc goal 8/21) -- Note: D/c goal 2/2 lack of progress-8/21- ESD

## 2018-12-03 NOTE — Progress Notes (Signed)
[facility] PHYSICAL MEDICINE & REHABILITATION PROGRESS NOTE   Subjective/Complaints: Pt lying in bed. Awake. Restless  ROS: Limited due to cognitive/behavioral    Objective:   No results found. No results for input(s): WBC, HGB, HCT, PLT in the last 72 hours. No results for input(s): NA, K, CL, CO2, GLUCOSE, BUN, CREATININE, CALCIUM in the last 72 hours.  Intake/Output Summary (Last 24 hours) at 12/03/2018 1017 Last data filed at 12/03/2018 0528 Gross per 24 hour  Intake 2291.94 ml  Output 1750 ml  Net 541.94 ml     Physical Exam: Vital Signs Blood pressure 140/75, pulse 84, temperature 98.2 F (36.8 C), temperature source Oral, resp. rate 19, height 6' (1.829 m), weight 83.9 kg, SpO2 100 %. Constitutional: No distress . Vital signs reviewed. mittens HEENT: EOMI, oral membranes moist Neck: supple Cardiovascular: RRR without murmur. No JVD    Respiratory: CTA Bilaterally without wheezes or rales. Normal effort    GI: BS +, non-tender, non-distended  Skin: Warm and dry.  Intact. Psych: Normal mood.  Normal behavior. Musc: No edema.  No tenderness. Neurological:mumbles typically incoherent replies to answers. I asked hm who his son was and he did say "[name]" Moves all 4's Skin: Skin iswarmand dry.  Psychiatric:restless, confused    Assessment/Plan: 1. Functional deficits secondary to bilateral posterior circulation infarcts which require 3+ hours per day of interdisciplinary therapy in a comprehensive inpatient rehab setting.  Physiatrist is providing close team supervision and 24 hour management of active medical problems listed below.  Physiatrist and rehab team continue to assess barriers to discharge/monitor patient progress toward functional and medical goals  Care Tool:  Bathing    Body parts bathed by patient: Face   Body parts bathed by helper: Front perineal area, Buttocks, Right upper leg, Left upper leg, Left lower leg, Right lower leg     Bathing  assist Assist Level: Maximal Assistance - Patient 24 - 49%     Upper Body Dressing/Undressing Upper body dressing   What is the patient wearing?: Pull over shirt    Upper body assist Assist Level: Total Assistance - Patient < 25%    Lower Body Dressing/Undressing Lower body dressing      What is the patient wearing?: Pants     Lower body assist Assist for lower body dressing: Total Assistance - Patient < 25%     Toileting Toileting    Toileting assist Assist for toileting: Dependent - Patient 0%     Transfers Chair/bed transfer  Transfers assist     Chair/bed transfer assist level: 2 Helpers     Locomotion Ambulation   Ambulation assist   Ambulation activity did not occur: Safety/medical concerns  Assist level: 2 helpers Assistive device: No Device Max distance: 15'   Walk 10 feet activity   Assist  Walk 10 feet activity did not occur: Safety/medical concerns  Assist level: 2 helpers     Walk 50 feet activity   Assist Walk 50 feet with 2 turns activity did not occur: Safety/medical concerns         Walk 150 feet activity   Assist Walk 150 feet activity did not occur: Safety/medical concerns         Walk 10 feet on uneven surface  activity   Assist Walk 10 feet on uneven surfaces activity did not occur: Safety/medical concerns         Wheelchair     Assist Will patient use wheelchair at discharge?: No   Wheelchair activity did  not occur: N/A         Wheelchair 50 feet with 2 turns activity    Assist    Wheelchair 50 feet with 2 turns activity did not occur: N/A       Wheelchair 150 feet activity     Assist Wheelchair 150 feet activity did not occur: N/A        Medical Problem List and Plan: 1.Functional deficitssecondary to Rosita with cortical blindness and confusion  Continue CIR therapies as tolerated, 15/7  -ongoing encephalopathy d/t multiple infarcts/associated edema  -I and SW have spoke  to son. Sister coming in from out of town. Palliative care consulted to assist with goals of care. Son still looking for living will/AD's. To sustain him nutritionally he will need a PEG. NGT not an option 2. Antithrombotics: -DVT/anticoagulation:Mechanical:Sequential compression devices, below kneeBilateral lower extremities -antiplatelet therapy: N/A 3. Pain Management:N/A 4. Mood:LCSW to follow for evaluation and support when appropriate -antipsychotic agents: off scheduled seroquel  -continue trial of ritalin-  10mg --not always getting d/t cognition   -consider amantadine as well 5. Neuropsych: This patientis notcapable of making decisions on hisown behalf. 6. Skin/Wound Care:Routine pressure relief measures 7. Fluids/Electrolytes/Nutrition: continue to push po, full supervision with meals  -supplements between meals as possible  -megace stopped   -consider PEG pending what clincal progress he makes, family decision  -continue D5 1/2NS for now  -prealbumin only 13 [id number] Lopressor.    Amlodipine increased to 7.5 on 8/12   -consider catapres d/t inconsistent swallowing  -bp's ok at present Vitals:   12/02/18 2200 12/03/18 0528  BP:  140/75  Pulse: 90 84  Resp:  19  Temp:  98.2 F (36.8 C)  SpO2:  100%   9.Agitation: depakote and seroquel stopped 10.GERD: On Protonix 11.Leukocytosis: Monitor for signs of infection.  Recheck CBC in a.m.  WBCs 13.9 on 8/12---14.7 8/14  -in reviewing chart, leukocytosis is chronic with neutrophilia---consider outpt h/o follow up. Don't think it's relevant at present 12. Urinary retention: Continue Flomax.   -UA negative, UCX negative x 2.   - mostly d/t poor cognition/awareness  -continue I/O caths as needed             LOS: 14 days A FACE TO [place] 12/03/2018, 10:17 AM

## 2018-12-03 NOTE — Progress Notes (Signed)
Physical Therapy Weekly Progress Note  Patient Details  Name: Walter Larsen MRN: [id number] Date of Birth: 04/29/1935  Beginning of progress report period: November 26, 2018 End of progress report period: December 03, 2018  Today's Date: 12/03/2018 PT Individual Time: 0915-0945 PT Individual Time Calculation (min): 30 min   Patient has met 0 of 4 short term goals. Pt has declined in functional status over last reporting period as he is now dependent for all mobility and not tolerating OOB activity 2/2 agitation and lethargy. He actively resists most bed mobility as well, yells out in pain w/ any movement (suspect in cervical region 2/2 significant stiffness), and bats his UEs at therapist's hands. Pt's family has requested palliative care consult, will await further recommendations and family decision regarding next level of care.   Patient continues to demonstrate the following deficits muscle weakness and muscle joint tightness, decreased cardiorespiratoy endurance, impaired timing and sequencing, unbalanced muscle activation, decreased coordination and decreased motor planning, decreased visual acuity and decreased visual perceptual skills, decreased initiation, decreased attention, decreased awareness, decreased problem solving, decreased safety awareness, decreased memory and delayed processing and decreased sitting balance, decreased standing balance, decreased postural control and decreased balance strategies and therefore will continue to benefit from skilled PT intervention to increase functional independence with mobility. Goals of care now focused on increasing tolerance to being manually assisted w/ functional mobility, increasing environmental awareness w/ auditory and tactile cues, and bed positioning for pain and pressure relief.   Patient not progressing toward long term goals.  See goal revision..  Plan of care revisions: gait goals discontinued 2/2 lack of progress and safety concerns w/  attempting any gait .  PT Short Term Goals Week 2:  PT Short Term Goal 1 (Week 2): Pt will transfer bed<>chair w/ max assist +1 consistently PT Short Term Goal 1 - Progress (Week 2): Not progressing PT Short Term Goal 2 (Week 2): Pt will initiate functional tasks w/ mod cues 50% of the time PT Short Term Goal 2 - Progress (Week 2): Not progressing PT Short Term Goal 3 (Week 2): Pt will perform bed mobility w/ mod assist PT Short Term Goal 3 - Progress (Week 2): Not progressing PT Short Term Goal 4 (Week 2): Pt will ambulte 10' w/ max assist +1 PT Short Term Goal 4 - Progress (Week 2): Not progressing Week 3:  PT Short Term Goal 1 (Week 3): =LTGs due to ELOS  Skilled Therapeutic Interventions/Progress Updates:   Pt in care of RN in supine, receiving suctioning and awake but not oriented. Session focused on calming strategies, positioning, and engaging in self-care tasks. R/L rolling dependently to don pants w/ total assist, verbal and tactile cues for pt to orient to task in order to decrease agitation w/ rolling. Brushed pt's hair and washed face w/ washcloth. Attempted to to perform hand-over-hand assist, however pt unable to 2/2 agitation w/ manipulating hands into position. Agitation decreases w/ hand-holding and decreased auditory stimuli from hospital environment and TV. Dependent to roll onto L side and reposition w/ pillows for chronic back and neck pain relief. Ended session in sidelying, all needs in reach. Mitts donned and instrumental music playing to decrease agitation.   Therapy Documentation Precautions:  Precautions Precautions: Fall Precaution Comments: cortical blindness Restrictions Weight Bearing Restrictions: No Vital Signs:    Therapy/Group: Individual Therapy   K  12/03/2018, 10:12 AM   

## 2018-12-03 NOTE — Progress Notes (Signed)
Occupational Therapy Weekly Progress Note  Patient Details  Name: Walter Larsen MRN: [id number] Date of Birth: 11/12/1935  Beginning of progress report period: November 20, 2018 End of progress report period: December 03, 2018  Today's Date: 12/03/2018 OT Individual Time: 6948-5462 OT Individual Time Calculation (min): 14 min  and Today's Date: 12/03/2018 OT Missed Time: 16 Minutes Missed Time Reason: Patient unwilling/refused to participate without medical reason;Patient fatigue  Patient has met 1 of 3 short term goals.  Pt has experienced functional decline this week. Pt was able to sit EOB yesterday for more than 5 minutes, so he did meet one of his weekly goals. Pt has been very limited by cognition. Pt unable to follow commands, he is confused, restless, and resistive. Pt unable to perform any functional tasks despite given multiple opportunities and modifications. Pt currently dependent for all BADL tasks and is not tolerating OOB. Pt will likely need to dc to SNF. Will continue to work towards goals.   Patient continues to demonstrate the following deficits: muscle weakness, impaired timing and sequencing, abnormal tone, unbalanced muscle activation, motor apraxia, ataxia, decreased coordination and decreased motor planning, decreased visual perceptual skills and cortical blindness, decreased midline orientation, decreased motor planning and ideational apraxia, decreased initiation, decreased attention, decreased awareness, decreased problem solving, decreased safety awareness, decreased memory and delayed processing and decreased sitting balance, decreased standing balance, decreased postural control, hemiplegia and decreased balance strategies and therefore will continue to benefit from skilled OT intervention to enhance overall performance with Reduce care partner burden.  Patient not progressing toward long term goals.  See goal revision..  Plan of care revisions: Care goals changed to Max A  overall.  OT Short Term Goals Week 2:  OT Short Term Goal 1 (Week 2): Pt will complete 1 step of UB dressing task with max multimodal cues OT Short Term Goal 1 - Progress (Week 2): Not met OT Short Term Goal 2 (Week 2): Pt will tolerate sitting EOB for 5 mins in preparation for BADL task OT Short Term Goal 2 - Progress (Week 2): Met OT Short Term Goal 3 (Week 2): Pt will complete functional transfer with LRAD and Max A of 1 person consistently OT Short Term Goal 3 - Progress (Week 2): Not met Week 3:  OT Short Term Goal 1 (Week 3): LTG=STG 2/2 ELOS  Skilled Therapeutic Interventions/Progress Updates:    Pt greeted sidelying in bed, lights off, and instrumental music playing. Pt restless and tangled up in his IV. OT untangled pt's IV while he swatted at therapist. OT tried to dependently sit pt at EOB, but he began moaning out and pushed back making it not safe for OT to sit him up. Pt closed his eyes and began snoring once laying back. Pt unable to continue with therapy session. Pt left sidelying in bed with lights off, classical music playing, and bed alarm on.   Therapy Documentation Precautions:  Precautions Precautions: Fall Precaution Comments: cortical blindness Restrictions Weight Bearing Restrictions: No Pain:  Pt groans when OT attempted to sit pt EOB. OT repositioned pt for comfort.    Therapy/Group: Individual Therapy  Valma Cava 12/03/2018, 11:05 AM

## 2018-12-04 ENCOUNTER — Inpatient Hospital Stay (HOSPITAL_COMMUNITY): Payer: Medicare HMO | Admitting: Occupational Therapy

## 2018-12-04 DIAGNOSIS — I612 Nontraumatic intracerebral hemorrhage in hemisphere, unspecified: Secondary | ICD-10-CM

## 2018-12-04 DIAGNOSIS — Z7189 Other specified counseling: Secondary | ICD-10-CM | POA: Insufficient documentation

## 2018-12-04 DIAGNOSIS — R1312 Dysphagia, oropharyngeal phase: Secondary | ICD-10-CM

## 2018-12-04 DIAGNOSIS — Z515 Encounter for palliative care: Secondary | ICD-10-CM

## 2018-12-04 DIAGNOSIS — I10 Essential (primary) hypertension: Secondary | ICD-10-CM

## 2018-12-04 NOTE — Progress Notes (Signed)
Occupational Therapy Session Note  Patient Details  Name: Walter Larsen MRN: [id number] Date of Birth: 1935/07/19  Today's Date: 12/04/2018 OT Individual Time: 0936-1000 OT Individual Time Calculation (min): 24 min   Short Term Goals: Week 3:  OT Short Term Goal 1 (Week 3): LTG=STG 2/2 ELOS  Skilled Therapeutic Interventions/Progress Updates:    Pt greeted supine in bed, awake, eyes open, talking to himself, very restless, and only with brief on as he had taken off gown. Pt did lift R LE seemingly when OT asked him to to help thread pant legs. Max A to roll for OT to pull pants up over hips and resisting OT turning pt. OT helped pt don gown dependently. OT explained that therapist was going to help sit him up at EOB and explained step by step throughout transition. OT dependently brought LEs off of bed, then he was able to help push UB into sitting, but still needed max A, and was swatting at therapist for helping. Once sitting EOB, pt spitting on floor and pushing back on therapist. OT felt it was not safe to continue sitting EOB, so OT dependently brought pt back to bed. Bed placed in trendelenburg to scoot pt up in bed. Pt left with bed in lowest position, bed alarm on, and needs met.   Therapy Documentation Precautions:  Precautions Precautions: Fall Precaution Comments: cortical blindness Restrictions Weight Bearing Restrictions: No Pain:   pt unable to state pain number  Therapy/Group: Individual Therapy  Valma Cava 12/04/2018, 9:46 AM

## 2018-12-04 NOTE — Progress Notes (Signed)
AMDREW MCROBIE is a 83 y.o. male [birth year] [id number]  Subjective: Verbal interaction almost an audible.  Repeats examiner's questions without focus.  Appears restless  Objective: Vital signs in last 24 hours: Temp:  [98 F (36.7 C)-98.5 F (36.9 C)] 98 F (36.7 C) (08/22 1045) Pulse Rate:  [85-101] 91 (08/22 1045) Resp:  [18-19] 19 (08/22 1045) BP: (136-155)/(73-106) 155/73 (08/22 1045) SpO2:  [95 %-100 %] 99 % (08/22 1045) Weight change:  Last BM Date: 12/01/18  Intake/Output from previous day: 08/21 0701 - 08/22 0700 In: 1756.4 [I.V.:1756.4] Out: 850 [Urine:850]  Physical Exam General:    Restless in bed.  Mitts over both hands with waist restraint due to cognitive limitations.  No objective evidence for physical distress Lungs: Normal effort. Lungs clear to auscultation anterior, no crackles or wheezes. Cardiovascular: Regular rate and rhythm, no edema   Lab Results: BMET    Component Value Date/Time   NA 131 (L) 11/29/2018 1319   K 3.8 11/29/2018 1319   CL 100 11/29/2018 1319   CO2 20 (L) 11/29/2018 1319   GLUCOSE 121 (H) 11/29/2018 1319   BUN 10 11/29/2018 1319   CREATININE 0.72 11/29/2018 1319   CALCIUM 8.8 (L) 11/29/2018 1319   GFRNONAA >60 11/29/2018 1319   GFRAA >60 11/29/2018 1319   CBC    Component Value Date/Time   WBC 14.5 (H) 11/29/2018 1319   RBC 4.65 11/29/2018 1319   HGB 14.7 11/29/2018 1319   HCT 42.8 11/29/2018 1319   PLT 417 (H) 11/29/2018 1319   MCV 92.0 11/29/2018 1319   MCH 31.6 11/29/2018 1319   MCHC 34.3 11/29/2018 1319   RDW 12.5 11/29/2018 1319   LYMPHSABS 1.4 11/29/2018 1319   MONOABS 2.5 (H) 11/29/2018 1319   EOSABS 0.2 11/29/2018 1319   BASOSABS 0.1 11/29/2018 1319   CBG's (last 3):  No results for input(s): GLUCAP in the last 72 hours. LFT's Lab Results  Component Value Date   ALT 42 11/22/2018   AST 39 11/22/2018   ALKPHOS 46 11/22/2018   BILITOT 1.2 11/22/2018    Studies/Results: No results  found.  Medications:  I have reviewed the patient's current medications. Scheduled Medications: . amLODipine  7.5 mg Oral Daily  . feeding supplement (ENSURE ENLIVE)  237 mL Oral BID BM  . fenofibrate  160 mg Oral Daily  . mouth rinse  15 mL Mouth Rinse BID  . methylphenidate  10 mg Oral BID WC  . metoprolol tartrate  25 mg Oral BID  . pantoprazole sodium  40 mg Per Tube QHS  . potassium chloride  20 mEq Oral BID  . senna-docusate  1 tablet Oral BID  . tamsulosin  0.4 mg Oral QPC breakfast   PRN Medications: acetaminophen, alum & mag hydroxide-simeth, bisacodyl, diphenhydrAMINE, guaiFENesin-dextromethorphan, lidocaine, polyethylene glycol, prochlorperazine **OR** prochlorperazine **OR** prochlorperazine, sodium phosphate  Assessment/Plan: Principal Problem:   ICH (intracerebral hemorrhage) (HCC) Active Problems:   Dysphagia, oropharyngeal   Lethargy   Urinary retention   Leukocytosis   Essential hypertension   Palliative care by specialist   Length of stay, days: 15  1.  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  Ongoing encephalopathy related to multiple infarcts and neurogenic edema.  Per chart review, goals of care conversations ongoing with son and patient's sister.  Appreciate supportive palliative care.  Continue supportive rehab care as able to participate and ongoing medical management. 2.  Hypertension -blood pressures reasonably controlled on current therapy 3.  Dysphasia related to neurologic deficit.  Limited oral intake due to agitation and confusion.  Family considering PEG vs potential for home hospice?  Luisdaniel Kenton A. Asa Lente, MD 12/04/2018, 11:55 AM

## 2018-12-04 NOTE — Progress Notes (Signed)
Daily Progress Note   Patient Name: Walter Larsen       Date: 12/04/2018 DOB: 06-14-1935  Age: 83 y.o. MRN#: [id number] Attending Physician: Meredith Staggers, MD Primary Care Physician: Greig Right, MD Admit Date: 11/19/2018  Reason for Consultation/Follow-up: Establishing goals of care  Subjective: Patient in bed, with waist and mitts on. Unclothed. Sleeping, does not awake to soft voice or touch.  Spoke with patient's daughter and son. Answered daughter's questions related to the need for Palliative meeting and setting of [place]. She has just arrived from [place].    Length of Stay: 15  Current Medications: Scheduled Meds:  . amLODipine  7.5 mg Oral Daily  . feeding supplement (ENSURE ENLIVE)  237 mL Oral BID BM  . fenofibrate  160 mg Oral Daily  . mouth rinse  15 mL Mouth Rinse BID  . methylphenidate  10 mg Oral BID WC  . metoprolol tartrate  25 mg Oral BID  . pantoprazole sodium  40 mg Per Tube QHS  . potassium chloride  20 mEq Oral BID  . senna-docusate  1 tablet Oral BID  . tamsulosin  0.4 mg Oral QPC breakfast    Continuous Infusions: . dextrose 5 % and 0.45% NaCl 75 mL/hr at 12/04/18 1601    PRN Meds: acetaminophen, alum & mag hydroxide-simeth, bisacodyl, diphenhydrAMINE, guaiFENesin-dextromethorphan, lidocaine, polyethylene glycol, prochlorperazine **OR** prochlorperazine **OR** prochlorperazine, sodium phosphate  Physical Exam Vitals signs and nursing note reviewed.  Constitutional:      Appearance: He is ill-appearing.     Comments: frail  Cardiovascular:     Rate and Rhythm: Normal rate.  Pulmonary:     Effort: Pulmonary effort is normal.  Skin:    Coloration: Skin is pale.             Vital Signs: BP (!) 156/95 (BP Location: Right Arm)   Pulse 96    Temp 98 F (36.7 C) (Axillary)   Resp 18   Ht 6' (1.829 m)   Wt 83.9 kg   SpO2 99%   BMI 25.09 kg/m  SpO2: SpO2: 99 % O2 Device: O2 Device: Room Air O2 Flow Rate:    Intake/output summary:   Intake/Output Summary (Last 24 hours) at 12/04/2018 1615 Last data filed at 12/04/2018 1244 Gross per 24 hour  Intake 1756.39 ml  Output 750 ml  Net 1006.39 ml   LBM: Last BM Date: 12/01/18 Baseline Weight: Weight: 83.9 kg Most recent weight: Weight: 83.9 kg       Palliative Assessment/Data: PPS: 10%   Flowsheet Rows     Most Recent Value  Intake Tab  Referral Department  -- [CIR]  Unit at Time of Referral  -- [CIR]  Palliative Care Primary Diagnosis  Neurology  Date Notified  12/02/18  Palliative Care Type  New Palliative care  Reason for referral  Clarify Goals of Care  Date of Admission  11/19/18  Date first seen by Palliative Care  12/03/18  # of days Palliative referral response time  1 Day(s)  # of days IP prior to Palliative referral  13  Clinical Assessment  Palliative Performance Scale Score  20%  Psychosocial & Spiritual Assessment  Palliative Care Outcomes  Patient/Family meeting held?  No      Patient Active Problem List   Diagnosis Date Noted  . Palliative care by specialist   . Lethargy   . Urinary retention   . Leukocytosis   . Essential hypertension   . Confusion 11/19/2018  . Dysphagia, oropharyngeal 11/19/2018  . 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 BPH (benign prostatic hyperplasia) 11/18/2018  . Cytotoxic brain edema ([place]) 11/15/2018  . Coronary artery disease involving native coronary artery of native heart without angina pectoris 01/10/2015  . Dyslipidemia 01/10/2015  . Cognitive deficits, late effect of cerebrovascular disease 11/07/2013  . Left homonymous hemianopsia 05/10/2013  . Presence of IVC filter 05/10/2013  . ICH (intracerebral hemorrhage) ([place]) 03/15/2013  . Dry eyes 03/08/2013  . Hyponatremia 03/08/2013  . Headache 03/08/2013  .  Acute encephalopathy 03/08/2013  . Pulmonary emboli ([place]) 03/08/2013  . Other and unspecified hyperlipidemia 03/02/2013  . Bradycardia 03/02/2013  . Hypokalemia 03/02/2013  . Hemorrhagic stroke ([place]) 03/01/2013  . Essential hypertension, benign 03/01/2013  . Fever, unspecified 03/01/2013    Palliative Care Assessment & Plan   Patient Profile: 83 y.o. male  with past medical history of CAD, [place] in 2014, and BLE DVTs admitted on 11/19/2018 to CIR following hospital admission 11/14/18 d/t acute onset of total blindness and findings multifocal acute intracerebral hemorrhages, bilaterally, posteriorly. Additional concerns for amyloid angiopathy which is likely causing hemorrhages.  Patient has declined since admission to CIR- initially able to converse appropriately, following single step commands.  Was making progress in CIR, however, on 8/11 had rapid decline, being found unresponsive by speech therapy- repeat CT showed expected evolution of bilateral cerebral hematomas. MRI 8/14 showed stable bilateral hematomas with edema and mass effect.  Patient has continued to decline with increasing agitation, decreasing participation in therapies. Unable to eat or follow commands. Does not converse. Palliative medicine consulted for [place].  Assessment/Recommendations/Plan   Plan made for patient's family to meet with PMT tomorrow at 56  Code Status:  Full code  Prognosis:   Unable to determine- very poor based on patient's decline in functional, nutritional, and cognitive status  Discharge Planning:  To Be Determined  Care plan was discussed with patient's daughter, and son.  Thank you for allowing the Palliative Medicine Team to assist in the care of this patient.   Time In: 1530 Time Out: 1615 Total Time 45 mins Prolonged Time Billed no      Greater than 50%  of this time was spent counseling and coordinating care related to the above assessment and plan.  Mariana Kaufman, AGNP-C Palliative  Medicine   Please contact Palliative  Medicine Team phone at [phone] for questions and concerns.

## 2018-12-04 NOTE — Progress Notes (Signed)
[place] tower, [place] is meeting with family 12/05/18 @ 10:30. Okay to grant entry to [employer], Marinda Elk, and [employer].

## 2018-12-04 NOTE — Progress Notes (Addendum)
Initial Nutrition Assessment  DOCUMENTATION CODES:   Not applicable  INTERVENTION:  8/23 family meeting to establish [place] - If PEG is placed recommend: Osmolite 1.5 @ 60 ml/hr Prostat 30 ml daily  Tube feed regimen provides 2260 kcal, 105 grams of protein, 1094 ml free water   -Magic cup TID with meals, each supplement provides 290 kcal and 9 grams of protein -Ensure Enlive po BID, each supplement provides 350 kcal and 20 grams of protein   NUTRITION DIAGNOSIS:   Inadequate oral intake related to lethargy/confusion, poor appetite as evidenced by energy intake < or equal to 50% for > or equal to 5 days.   GOAL:   Patient will meet greater than or equal to 90% of their needs   MONITOR:   PO intake, Weight trends, I & O's, Other (Comment)(GOCs s/p family meeting on 8/23)  REASON FOR ASSESSMENT:   Malnutrition Screening Tool    ASSESSMENT:  RD working remotely.  83 year old male with history of CAD s/p stenting, ICH 2014 with residual visual deficits, BLE DVTs, [facility] 8/2 admission with acute onset of total blindness; MRI of brain showed parenchymal hemorrhages CT revealed large hematoma, bilateral subarachnoid hemorrhage with bleed suggestive of amyloid angiopathy. CIR recommended due to functional decline  Per chart review, pt verbal interaction almost inaudible, he is reported to repeat questions without focus, and appears restless. Mitts over both hands with waist restraint due to cognitive limitations  Patient eating 0% of the last 8 recorded meals. Limited oral intake due to agitation and confusion  8/21 - per PCT note -  no answer or return call from family for discussion regarding multiple issues - lack of nutrition intake (PEG placement), home with hospice vs SNF, GOCs, and code status  8/22 -Palliative Care meeting with family scheduled 8/23 @ 10:30 - RD will continue to monitor for GOCs - recommend initating nutrition support with continued full scope of care  decision.    Current wt 83.9 kg (taken 8/14); suspect actual wt to be less given no meal intake over the past couple of days. RD to order daily weights 8/2: 84.9 kg  I/O: -2380 ml since admit UOP: 850 ml x 24 hrs  NUTRITION - FOCUSED PHYSICAL EXAM: Unable to complete at this time   Diet Order:   Diet Order            DIET - DYS 1 Room service appropriate? Yes; Fluid consistency: Thin  Diet effective now              EDUCATION NEEDS:   Not appropriate for education at this time  Skin:  Skin Assessment: Reviewed RN Assessment(MASD; buttocks; scrotum)  Last BM:  8/19  Height:   Ht Readings from Last 1 Encounters:  12/02/18 6' (1.829 m)    Weight:   Wt Readings from Last 1 Encounters:  11/26/18 83.9 kg    Ideal Body Weight:  80.9 kg  BMI:  Body mass index is 25.09 kg/m.  Estimated Nutritional Needs:   Kcal:  2100-2300  Protein:  105-115  Fluid:  >2L/day    Lajuan Lines, RD, LDN [facility] Telephone [phone] After Hours/Weekend Pager: [phone]

## 2018-12-05 ENCOUNTER — Inpatient Hospital Stay (HOSPITAL_COMMUNITY): Payer: Medicare HMO | Admitting: Speech Pathology

## 2018-12-05 DIAGNOSIS — Z7189 Other specified counseling: Secondary | ICD-10-CM

## 2018-12-05 NOTE — Progress Notes (Signed)
Speech Language Pathology Daily Session Note  Patient Details  Name: Walter Larsen MRN: [id number] Date of Birth: May 16, 1935  Today's Date: 12/05/2018 SLP Individual Time: 1445-1500 SLP Individual Time Calculation (min): 15 min  Short Term Goals: Week 3: SLP Short Term Goal 1 (Week 3): Pt will consume current diet with minimal overt s/s of aspiration and Max A cues for use of compensatory swallow strategies. SLP Short Term Goal 2 (Week 3): Pt will follow 1 step basic directions in 2 out of 10 opportunities with Max A multimodal cues. SLP Short Term Goal 3 (Week 3): Pt will sustain attention to basic familiar task for ~ 1 minute with Max A multimodal cues.  Skilled Therapeutic Interventions:  Pt was seen briefly for skilled ST.  Pt was asleep in bed upon therapist's arrival and did not awaken to voice or light touch.  Per chart review from palliative medicine team, family is wishing to transition pt to comfort care in light of his poor prognosis and decline in functionality.   Nursing reported that pt has become nonverbal and is refusing to eat today.  Therapist attempted to provide oral care; however, pt pursed his lips and said "Quit, that's aggravating me" so attempts were stopped.  Pt was noted to shiver in bed and when asked if he would like a blanket, pt stated "That'd be alright."  Otherwise pt made no other attempts to communicate with therapist and went back to sleep.  Pt was left in bed with bed alarm set and session was ended early.  Continue per current plan of care.    Pain Pain Assessment Pain Scale: Faces Faces Pain Scale: No hurt  Therapy/Group: Individual Therapy  Romonda Parker, Selinda Orion 12/05/2018, 3:25 PM

## 2018-12-05 NOTE — Progress Notes (Signed)
Walter Larsen is a 83 y.o. male 1935-08-04 [id number]  Subjective: Verbal interaction almost inaudible.   Appears restless  Objective: Vital signs in last 24 hours: Temp:  [97.8 F (36.6 C)-98 F (36.7 C)] 97.8 F (36.6 C) (08/23 0338) Pulse Rate:  [74-110] 102 (08/23 0340) Resp:  [18-19] 18 (08/23 0338) BP: (111-156)/(73-95) 146/80 (08/23 0338) SpO2:  [97 %-99 %] 98 % (08/23 0340) Weight:  [83.9 kg] 83.9 kg (08/23 0357) Weight change:  Last BM Date: 12/01/18  Intake/Output from previous day: 08/22 0701 - 08/23 0700 In: 0  Out: 1550 [Urine:1550]  Physical Exam General:    Restless in bed.  Mitts over both hands with waist restraint due to cognitive limitations.  No objective evidence for physical distress Lungs: Normal effort. Lungs clear to auscultation anterior, no crackles or wheezes. Cardiovascular: Regular rate and rhythm, no edema   Lab Results: BMET    Component Value Date/Time   NA 131 (L) 11/29/2018 1319   K 3.8 11/29/2018 1319   CL 100 11/29/2018 1319   CO2 20 (L) 11/29/2018 1319   GLUCOSE 121 (H) 11/29/2018 1319   BUN 10 11/29/2018 1319   CREATININE 0.72 11/29/2018 1319   CALCIUM 8.8 (L) 11/29/2018 1319   GFRNONAA >60 11/29/2018 1319   GFRAA >60 11/29/2018 1319   CBC    Component Value Date/Time   WBC 14.5 (H) 11/29/2018 1319   RBC 4.65 11/29/2018 1319   HGB 14.7 11/29/2018 1319   HCT 42.8 11/29/2018 1319   PLT 417 (H) 11/29/2018 1319   MCV 92.0 11/29/2018 1319   MCH 31.6 11/29/2018 1319   MCHC 34.3 11/29/2018 1319   RDW 12.5 11/29/2018 1319   LYMPHSABS 1.4 11/29/2018 1319   MONOABS 2.5 (H) 11/29/2018 1319   EOSABS 0.2 11/29/2018 1319   BASOSABS 0.1 11/29/2018 1319   CBG's (last 3):  No results for input(s): GLUCAP in the last 72 hours. LFT's Lab Results  Component Value Date   ALT 42 11/22/2018   AST 39 11/22/2018   ALKPHOS 46 11/22/2018   BILITOT 1.2 11/22/2018    Studies/Results: No results found.  Medications:  I have  reviewed the patient's current medications. Scheduled Medications: . amLODipine  7.5 mg Oral Daily  . feeding supplement (ENSURE ENLIVE)  237 mL Oral BID BM  . fenofibrate  160 mg Oral Daily  . mouth rinse  15 mL Mouth Rinse BID  . methylphenidate  10 mg Oral BID WC  . metoprolol tartrate  25 mg Oral BID  . pantoprazole sodium  40 mg Per Tube QHS  . potassium chloride  20 mEq Oral BID  . senna-docusate  1 tablet Oral BID  . tamsulosin  0.4 mg Oral QPC breakfast   PRN Medications: acetaminophen, alum & mag hydroxide-simeth, bisacodyl, diphenhydrAMINE, guaiFENesin-dextromethorphan, lidocaine, polyethylene glycol, prochlorperazine **OR** prochlorperazine **OR** prochlorperazine, sodium phosphate  Assessment/Plan: Principal Problem:   ICH (intracerebral hemorrhage) (HCC) Active Problems:   Dysphagia, oropharyngeal   Lethargy   Urinary retention   Leukocytosis   Essential hypertension   Palliative care by specialist   Advanced care planning/counseling discussion   Goals of care, counseling/discussion   Length of stay, days: 16  1.  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  Ongoing encephalopathy related to multiple infarcts and neurogenic edema.  Per chart review, goals of care conversations ongoing with son and patient's sister.  Appreciate supportive palliative care.  Continue supportive rehab care as able to participate and  ongoing medical management. 2.  Hypertension -blood pressures reasonably controlled on current therapy 3.  Dysphasia related to neurologic deficit.  Limited oral intake due to agitation and confusion.  Family considering PEG vs potential for home hospice?  Valerie A. Asa Lente, MD 12/05/2018, 10:18 AM

## 2018-12-05 NOTE — Progress Notes (Signed)
Daily Progress Note   Patient Name: Walter Larsen       Date: 12/05/2018 DOB: July 11, 1935  Age: 83 y.o. MRN#: [id number] Attending Physician: Meredith Staggers, MD Primary Care Physician: Greig Right, MD Admit Date: 11/19/2018  Reason for Consultation/Follow-up: Establishing goals of care, Pain control and Psychosocial/spiritual support  Subjective: Spoke with RN and Tech at bedside.  Patient stopped eating several days ago.  He is not communicating well with staff today.  Mostly groaning in pain.  Staff very concerned about his decline and suffering.  Wife and daughter came to bedside.  We added his son [name] on by phone.  They have been working with an attorney to attempt to obtain medicaid and place him in an SNF.   Son describes the decline he has seen in his father's functionality over the past week.  Son feels as though his father has assessed what he has lost - his vision, his independence, etc..  He feels his father has made a decision that this is not the type of life he wants to live. Son is respectful of his father's decisions.  Patient's wife agrees that the patient would not want to prolong a life of dependence and pain a feeding tube would not be appropriate.  Dtr agrees.  We reviewed his health history and what has happened this hospitalization.  We reviewed his most recent MRI images.  We discussed what to expect and his prognosis.  I recommended a shift in goals to comfort rather than recovery.  Family is in agreement.  We want to focus on his happiness and comfort.    Discussed code status.  Changed to DNR.    Discussed [facility], aspects of care there, financial issues, transport.   Family is in support of transfer to [facility] (they live in  [place]), but they feel they need to confer with their attorney first.    Will allow time for family to confer with attorney Monday morning before placing order for [facility].  Permission obtained to change goals of care and code status now.  Son [name] needs a letter to give the bank stating his father is incapacitated so that [name] can access the safe deposit box.   Assessment: 83 yo gentleman with multiple ICH secondary to presumed amyloidosis.  Admitted with blindness and acute bilateral ICH.  Additional ICH and hematomas noted 8/11 with subsequent decline in function.  Now has not eaten for several days.  Increasing agitation and confusion.  No IV access. Not taking pills.   Patient Profile/HPI:  83 y.o. male  with past medical history of CAD, [place] in 2014, and BLE DVTs admitted on 11/19/2018 to CIR following hospital admission 11/14/18 d/t acute onset of total blindness. MRI revealed ICH. Patient with confusion and agitation. Patient experiencing functional and nutritional decline in CIR. PMT consulted for [place].      Length of Stay: 16  Current Medications: Scheduled Meds:   amLODipine  7.5 mg Oral Daily   feeding supplement (ENSURE ENLIVE)  237 mL Oral BID BM   fenofibrate  160 mg Oral Daily   mouth rinse  15 mL Mouth Rinse BID   methylphenidate  10 mg Oral BID WC   metoprolol tartrate  25 mg Oral BID   pantoprazole sodium  40 mg Per Tube QHS   potassium chloride  20 mEq Oral BID   senna-docusate  1 tablet Oral BID   tamsulosin  0.4 mg Oral QPC breakfast    Continuous Infusions:  dextrose 5 % and 0.45% NaCl 75 mL/hr at 12/05/18 0700    PRN Meds: acetaminophen, alum & mag hydroxide-simeth, bisacodyl, diphenhydrAMINE, guaiFENesin-dextromethorphan, lidocaine, polyethylene glycol, prochlorperazine **OR** prochlorperazine **OR** prochlorperazine, sodium phosphate  Physical Exam       Well developed chronically ill appearing.  Groans and shifts in bed,   Responds to my voice. CV tachy resp no distress Abdomen soft, nt, nd Lower ext without edema  Vital Signs: BP (!) 146/80 (BP Location: Right Arm)    Pulse (!) 102    Temp 97.8 F (36.6 C) (Oral)    Resp 18    Ht 6' (1.829 m)    Wt 83.9 kg    SpO2 98%    BMI 25.09 kg/m  SpO2: SpO2: 98 % O2 Device: O2 Device: Room Air O2 Flow Rate:    Intake/output summary:   Intake/Output Summary (Last 24 hours) at 12/05/2018 1219 Last data filed at 12/05/2018 1059 Gross per 24 hour  Intake 300 ml  Output 1550 ml  Net -1250 ml   LBM: Last BM Date: 12/01/18 Baseline Weight: Weight: 83.9 kg Most recent weight: Weight: 83.9 kg       Palliative Assessment/Data: 20%    Flowsheet Rows     Most Recent Value  Intake Tab  Referral Department  -- [CIR]  Unit at Time of Referral  -- [CIR]  Palliative Care Primary Diagnosis  Neurology  Date Notified  12/02/18  Palliative Care Type  New Palliative care  Reason for referral  Clarify Goals of Care  Date of Admission  11/19/18  Date first seen by Palliative Care  12/03/18  # of days Palliative referral response time  1 Day(s)  # of days IP prior to Palliative referral  13  Clinical Assessment  Palliative Performance Scale Score  20%  Psychosocial & Spiritual Assessment  Palliative Care Outcomes  Patient/Family meeting held?  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  BPH (benign prostatic hyperplasia) 11/18/2018   Cytotoxic brain edema ([place]) 11/15/2018   Coronary artery disease involving native coronary artery of native heart without angina pectoris 01/10/2015   Dyslipidemia 01/10/2015   Cognitive deficits, late effect  of cerebrovascular disease 11/07/2013   Left homonymous hemianopsia 05/10/2013   Presence of IVC filter 05/10/2013   ICH (intracerebral hemorrhage) ([place]) 03/15/2013   Dry eyes 03/08/2013   Hyponatremia 03/08/2013   Headache 03/08/2013   Acute encephalopathy 03/08/2013   Pulmonary emboli ([place]) 03/08/2013   Other and unspecified hyperlipidemia 03/02/2013   Bradycardia 03/02/2013   Hypokalemia 03/02/2013   Hemorrhagic stroke ([place]) 03/01/2013   Essential hypertension, benign 03/01/2013   Fever, unspecified 03/01/2013    Palliative Care Plan    Recommendations/Plan:  Change code status to DNR.  Change goal of care to comfort.  Thus will change orders to be comfort focused.  Need to increase happiness and comfort.  Please lift visiting restrictions allowing wife and daughter to visit in addition to son.  Anticipate requesting a bed at [facility] on 8/24.  Will create a letter for son.  Goals of Care and Additional Recommendations:  Limitations on Scope of Treatment: Full Comfort Care  Code Status:  DNR  Prognosis:   < 2 weeks.  Not eating or drinking.   Discharge Planning:  Hospice facility  Care plan was discussed with RN, Tech, family.  Thank you for allowing the Palliative Medicine Team to assist in the care of this patient.  Total time spent:  90 min. Time in 10:30 Time out:  12:00     Greater than 50%  of this time was spent counseling and coordinating care related to the above assessment and plan.  Florentina Jenny, PA-C Palliative Medicine  Please contact Palliative MedicineTeam phone at [phone] for questions and concerns between 7 am - 7 pm.   Please see AMION for individual provider pager numbers.

## 2018-12-06 ENCOUNTER — Inpatient Hospital Stay (HOSPITAL_COMMUNITY): Payer: Medicare HMO | Admitting: Occupational Therapy

## 2018-12-06 ENCOUNTER — Inpatient Hospital Stay (HOSPITAL_COMMUNITY): Payer: Medicare HMO | Admitting: Physical Therapy

## 2018-12-06 ENCOUNTER — Inpatient Hospital Stay (HOSPITAL_COMMUNITY): Payer: Medicare HMO | Admitting: Speech Pathology

## 2018-12-06 DIAGNOSIS — R410 Disorientation, unspecified: Secondary | ICD-10-CM

## 2018-12-06 MED ORDER — PROCHLORPERAZINE MALEATE 5 MG PO TABS: 5.0000 mg | ORAL_TABLET | Freq: Four times a day (QID) | ORAL | 0 refills | Status: AC | PRN

## 2018-12-06 MED ORDER — PANTOPRAZOLE SODIUM 40 MG PO PACK: 40.0000 mg | PACK | Freq: Every day | ORAL | Status: AC

## 2018-12-06 MED ORDER — BIOTENE DRY MOUTH MT LIQD: 15.0000 mL | OROMUCOSAL | Status: AC | PRN

## 2018-12-06 MED ORDER — HALOPERIDOL LACTATE 5 MG/ML IJ SOLN
0.5000 mg | INTRAMUSCULAR | Status: DC | PRN
  Filled 2018-12-06: qty 0.1

## 2018-12-06 MED ORDER — OLANZAPINE 5 MG PO TBDP
2.5000 mg | ORAL_TABLET | Freq: Every day | ORAL | Status: DC
  Filled 2018-12-06: qty 0.5

## 2018-12-06 MED ORDER — POLYVINYL ALCOHOL 1.4 % OP SOLN: 1.0000 [drp] | Freq: Four times a day (QID) | OPHTHALMIC | 0 refills | Status: AC | PRN

## 2018-12-06 MED ORDER — GLYCOPYRROLATE 0.2 MG/ML IJ SOLN: 0.2000 mg | INTRAMUSCULAR | Status: AC | PRN

## 2018-12-06 MED ORDER — GLYCOPYRROLATE 0.2 MG/ML IJ SOLN
0.2000 mg | INTRAMUSCULAR | Status: DC | PRN
  Filled 2018-12-06: qty 1

## 2018-12-06 MED ORDER — BISACODYL 10 MG RE SUPP: 10.0000 mg | Freq: Every day | RECTAL | 0 refills | Status: AC | PRN

## 2018-12-06 MED ORDER — PROCHLORPERAZINE EDISYLATE 10 MG/2ML IJ SOLN: 5.0000 mg | Freq: Four times a day (QID) | INTRAMUSCULAR | Status: AC | PRN

## 2018-12-06 MED ORDER — HALOPERIDOL 0.5 MG PO TABS: 0.5000 mg | ORAL_TABLET | ORAL | Status: AC | PRN

## 2018-12-06 MED ORDER — FLEET ENEMA 7-19 GM/118ML RE ENEM: 1.0000 | ENEMA | Freq: Once | RECTAL | 0 refills | Status: AC | PRN

## 2018-12-06 MED ORDER — HALOPERIDOL 0.5 MG PO TABS
0.5000 mg | ORAL_TABLET | ORAL | Status: DC | PRN
  Filled 2018-12-06: qty 1

## 2018-12-06 MED ORDER — ACETAMINOPHEN 325 MG PO TABS: 325.0000 mg | ORAL_TABLET | ORAL | Status: AC | PRN

## 2018-12-06 MED ORDER — POLYVINYL ALCOHOL 1.4 % OP SOLN
1.0000 [drp] | Freq: Four times a day (QID) | OPHTHALMIC | Status: DC | PRN
  Filled 2018-12-06: qty 15

## 2018-12-06 MED ORDER — GLYCOPYRROLATE 1 MG PO TABS
1.0000 mg | ORAL_TABLET | ORAL | Status: DC | PRN
  Filled 2018-12-06: qty 1

## 2018-12-06 MED ORDER — BIOTENE DRY MOUTH MT LIQD: 15.0000 mL | OROMUCOSAL | Status: DC | PRN

## 2018-12-06 MED ORDER — GLYCOPYRROLATE 1 MG PO TABS: 1.0000 mg | ORAL_TABLET | ORAL | Status: AC | PRN

## 2018-12-06 MED ORDER — HALOPERIDOL LACTATE 5 MG/ML IJ SOLN: 0.5000 mg | INTRAMUSCULAR | Status: AC | PRN

## 2018-12-06 MED ORDER — HALOPERIDOL LACTATE 2 MG/ML PO CONC
0.5000 mg | ORAL | Status: DC | PRN
  Administered 2018-12-06 (×2): 0.5 mg via SUBLINGUAL
  Filled 2018-12-06 (×4): qty 0.3

## 2018-12-06 MED ORDER — OLANZAPINE 5 MG PO TBDP: 2.5000 mg | ORAL_TABLET | Freq: Every day | ORAL | Status: AC

## 2018-12-06 MED ORDER — ALUM & MAG HYDROXIDE-SIMETH 200-200-20 MG/5ML PO SUSP: 30.0000 mL | ORAL | 0 refills | Status: AC | PRN

## 2018-12-06 MED ORDER — PROCHLORPERAZINE 25 MG RE SUPP: 12.5000 mg | Freq: Four times a day (QID) | RECTAL | 0 refills | Status: AC | PRN

## 2018-12-06 NOTE — Progress Notes (Signed)
[facility] Received referral from [employer] with PC.   Spoke to the wife [name] and confirmed interest in the pt transferring to our inpt facility in [place]. She is in agreement with this.   Our Dr. Has agree that pt is appropriate for care at the [facility].  Bed was offered to family. They will meet at 400pm today to sign paperwork.   TC to speak with SW Lorre Nick and she will work with PA to get pt discharged and set up transfer for pt to travel by Ambulance. Webb Silversmith RN [phone]

## 2018-12-06 NOTE — Progress Notes (Signed)
Patient noted to have increased agitation and aggression. Patient pulled out iv in right hand.  PA Pam Love  Informed of change in patient. Adria Devon, LPN.

## 2018-12-06 NOTE — Progress Notes (Signed)
Nutrition Brief Note  Chart reviewed. Pt now transitioning to comfort care.  No further nutrition interventions warranted at this time. RD will d/c Ensure Enlive order. Please re-consult as needed.    Gaynell Face, MS, RD, LDN Inpatient Clinical Dietitian Pager: [phone] Weekend/After Hours: [phone]

## 2018-12-06 NOTE — Discharge Summary (Signed)
Physician Discharge Summary  Patient ID: NESTA CUDE MRN: [id number] DOB/AGE: August 10, 1935 83 y.o.  Admit date: 11/19/2018 Discharge date: 12/06/2018  Discharge Diagnoses:  Principal Problem:   ICH (intracerebral hemorrhage) ([place]) Active Problems:   Dysphagia, oropharyngeal   Cortical blindness   Urinary retention   Leukocytosis   Essential hypertension   Palliative care by specialist   Subacute delirium   Discharged Condition: Hospice care   Significant Diagnostic Studies: Ct Head Wo Contrast  Result Date: 11/23/2018 CLINICAL DATA:  Fatigue and malaise EXAM: CT HEAD WITHOUT CONTRAST TECHNIQUE: Contiguous axial images were obtained from the base of the skull through the vertex without intravenous contrast. COMPARISON:  11/15/2018 FINDINGS: Brain: Decreased size, peripheral density, and circumscription of subacute hematomas in the right posterior temporal and left occipitotemporal lobes. Associated edema is unchanged. Dense encephalomalacia in the right occipital lobe. 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 Ventriculomegaly primarily related to central predominant volume loss. No midline shift. No interval cytotoxic edema. Vascular: Atherosclerotic calcification Skull: Negative Sinuses/Orbits: Bilateral cataract resection IMPRESSION: Large bilateral cerebral hematomas show expected evolution with decreasing size. Similar degree of associated edema and local mass effect. No new abnormality. Electronically Signed   By: Monte Fantasia M.D.   On: 11/23/2018 11:44   Ct Head Wo Contrast  Result Date: 11/15/2018 CLINICAL DATA:  Follow-up ICH, headache, altered mental status EXAM: CT HEAD WITHOUT CONTRAST TECHNIQUE: Contiguous axial images were obtained from the base of the skull through the vertex without intravenous contrast. COMPARISON:  CT brain, 11/14/2018 FINDINGS: Brain: There is redemonstrated multifocal acute parenchymal hemorrhage. A large hematoma of the  right frontotemporoparietal junction is enlarged, measuring 5.4 x 3.7 cm, previously 3.8 x 3.7 cm when measured similarly (series 3, image 17). An adjacent focus of hemorrhage in the right occipital lobe is not significantly changed measuring 2.2 x 1.3 cm (series 3, image 16). Hemorrhage of the left parietooccipital lobe is not significantly changed measuring 3.4 x 2.1 cm (series 3, image 13). A very small focus of hemorrhage seen on prior examination in the medial posterior left frontal lobe is not well appreciated (series 3, image 26). There is minimal bilateral subarachnoid hemorrhage without substantial subdural collection. There is mild mass effect on the right lateral ventricle without significant midline shift. Underlying small-vessel white matter disease. Unchanged right occipital encephalomalacia. Vascular: No hyperdense vessel or unexpected calcification. Skull: Normal. Negative for fracture or focal lesion. Sinuses/Orbits: No acute finding. Other: None. IMPRESSION: 1. There is redemonstrated multifocal acute parenchymal hemorrhage. A large hematoma of the right frontotemporoparietal junction is enlarged, measuring 5.4 x 3.7 cm, previously 3.8 x 3.7 cm when measured similarly (series 3, image 17). 2. An adjacent focus of hemorrhage in the right occipital lobe is not significantly changed measuring 2.2 x 1.3 cm (series 3, image 16). Hemorrhage of the left parietooccipital lobe is not significantly changed measuring 3.4 x 2.1 cm (series 3, image 13). A very small focus of hemorrhage seen on prior examination in the medial posterior left frontal lobe is not well appreciated (series 3, image 26). Distribution of multifocal parenchymal hemorrhage again suggests amyloid angiopathy. 3. There is minimal bilateral subarachnoid hemorrhage without substantial subdural collection. There is mild mass effect on the right lateral ventricle without significant midline shift. 4. Underlying small-vessel white matter  disease. Unchanged right occipital encephalomalacia. Electronically Signed   By: Eddie Candle M.D.   On: 11/15/2018 11:27   Mr Brain Wo Contrast  Result Date: 11/26/2018 CLINICAL DATA:  Dysphagia. EXAM:  MRI HEAD WITHOUT CONTRAST TECHNIQUE: Multiplanar, multiecho pulse sequences of the brain and surrounding structures were obtained without intravenous contrast. COMPARISON:  Head CT 11/23/2018 and MRI 11/15/2018 FINDINGS: The study is severely motion degraded. Brain: Multiple subacute parenchymal hemorrhages are again seen posteriorly in both cerebral hemispheres. The largest measures approximately 7 cm in the posterior right temporal lobe, and there are hemorrhages in both occipital lobes including a 3.9 cm hematoma on the left. There is mild edema associated with these hemorrhages which is similar to the prior CT. There is associated regional sulcal effacement and mass effect on the right temporal horn without midline shift. A thin subdural hematoma over the left cerebral convexity measures up to 2 mm in thickness in the posterior temporal region without mass effect not well demonstrated on the prior CT due to its very small size. Numerous chronic bilateral cerebral and cerebellar hemorrhages were better demonstrated on the susceptibility weighted sequence on the prior MRI. The punctate foci of trace diffusion signal abnormality on the prior MRI in the left superior frontal gyrus and left cerebellum are no longer apparent. There is diffusion abnormality associated with the hemorrhages on today's study without definite acute infarct seen separately. Extensive encephalomalacia is again noted in the right occipital lobe, and there is age advanced chronic small vessel ischemia in the cerebral white matter. There is central predominant cerebral atrophy. Vascular: Major intracranial vascular flow voids are grossly preserved. Skull and upper cervical spine: No destructive skull lesion. Sinuses/Orbits: Bilateral  cataract extraction. No significant inflammatory sinus disease is evident. Other: None. IMPRESSION: 1. Severely motion degraded examination. 2. Subacute bilateral cerebral hematomas as previously seen. Similar edema and local mass effect without midline shift. 3. Thin subdural hematoma over the left cerebral convexity without mass effect. 4. No definite acute infarct identified. Electronically Signed   By: Logan Bores M.D.   On: 11/26/2018 16:24   Mr Brain Wo Contrast  Result Date: 11/15/2018 CLINICAL DATA:  Cerebral hemorrhage. EXAM: MRI HEAD WITHOUT CONTRAST TECHNIQUE: Multiplanar, multiecho pulse sequences of the brain and surrounding structures were obtained without intravenous contrast. COMPARISON:  Head CT 11/14/2018 and MRI 03/05/2013 FINDINGS: The examination had to be discontinued prior to completion due to the patient's confusion and attempts to climb off the MRI table despite use of medication and immobilization. Axial and coronal diffusion sequences and susceptibility weighted imaging were obtained. A 2 mm focus of trace diffusion hyperintensity involves cortex of the left superior frontal gyrus, and a similar 2 mm focus is present in the left cerebellar hemisphere. ADC is not clearly reduced although assessment is limited by small size. These do not correspond to hemorrhages based on the SWI sequence. A large acute parenchymal hemorrhage in the right temporal lobe and smaller acute hemorrhages in both occipital lobes are similar in size to today's CT with associated edema and regional mass effect as described on that examination. There is no significant midline shift. There is hemosiderin stained encephalomalacia in the right occipital lobe related to an old hemorrhage. Multiple additional small chronic hemorrhages are present peripherally in both cerebral hemispheres, not apparent on the 2014 MRI although susceptibility weighted imaging was not performed on that study. Chronic microhemorrhages are  also present in the cerebellum bilaterally. The thalami are spared, and there are at most 1 or 2 chronic microhemorrhages in the basal ganglia (versus vascular susceptibility). Scattered sulcal susceptibility artifact is present bilaterally corresponding to small volume subarachnoid hemorrhage on CT. IMPRESSION: 1. Incomplete examination. 2. Parenchymal hemorrhages posteriorly in  both cerebral hemispheres with associated small volume subarachnoid hemorrhage as described on today's CT. 3. Multiple additional chronic hemorrhages which may reflect cerebral amyloid angiopathy. 4. Punctate foci of diffusion abnormality in the left frontal lobe and left cerebellum, possibly subacute infarcts. Electronically Signed   By: Logan Bores M.D.   On: 11/15/2018 11:27   Dg Chest Port 1 View  Result Date: 11/23/2018 CLINICAL DATA:  Lethargy. EXAM: PORTABLE CHEST 1 VIEW COMPARISON:  11/15/2018. FINDINGS: Mediastinum and hilar structures normal. Heart size normal. Interim near complete resolution of right base subsegmental atelectasis. No pleural effusion or pneumothorax. Degenerative change thoracic spine. IMPRESSION: Interim near complete resolution of right base subsegmental atelectasis. No acute cardiopulmonary disease. Electronically Signed   By: Marcello Moores  Register   On: 11/23/2018 11:02   Dg Chest Port 1 View  Result Date: 11/15/2018 CLINICAL DATA:  Metastasis, history hypertension, stroke, coronary artery disease post stenting at EXAM: PORTABLE CHEST 1 VIEW COMPARISON:  Portable exam 1108 hours compared to 03/01/2013 FINDINGS: Upper normal size of cardiac silhouette. Mediastinal contours and pulmonary vascularity normal. Atherosclerotic calcification aorta. RIGHT basilar atelectasis. Lungs otherwise clear. No definite infiltrate, pleural effusion or pneumothorax. Bones demineralized with degenerative disc disease changes of the thoracic spine. IMPRESSION: RIGHT basilar atelectasis. Electronically Signed   By: Lavonia Dana  M.D.   On: 11/15/2018 11:26   Ct Head Code Stroke Wo Contrast  Addendum Date: 11/14/2018   ADDENDUM REPORT: 11/14/2018 08:42 ADDENDUM: Study discussed by telephone with Dr. Cheral Marker on 11/14/2018 at 0827 hours. Electronically Signed   By: Genevie Ann M.D.   On: 11/14/2018 08:42   Result Date: 11/14/2018 CLINICAL DATA:  Code stroke. 83 year old male with sudden onset vision loss in both eyes. EXAM: CT HEAD WITHOUT CONTRAST TECHNIQUE: Contiguous axial images were obtained from the base of the skull through the vertex without intravenous contrast. COMPARISON:  Brain MRI 03/05/2013. [facility] Head CT 09/23/2013. FINDINGS: Brain: Multifocal acute intra-axial hemorrhages in both cerebral hemispheres. Blood volume is greater on the right, where there is a confluent hemorrhage occupying the posterior right temporal lobe encompassing 46 x 48 by 45 millimeters (AP by transverse by CC). Smaller multifocal acute hemorrhages are present in an area of chronic right occipital pole encephalomalacia, the larger is 21 millimeters diameter on series 3, image 18. In the contralateral left lateral occipital lobe there is heterogeneous hyperdense hemorrhage encompassing 36 by 22 by 42 millimeters (AP by transverse by CC). There is associated parenchymal edema at each acute hemorrhage site. Underlying chronic right parietal and occipital lobe encephalomalacia. Mass effect on the occipital and temporal horns, but no ventriculomegaly. No intraventricular extension of blood. No midline shift. Basilar cisterns remain normal. Trace if any extra-axial extension of blood (perhaps some subarachnoid along the right sylvian fissure). On the 2014 brain MRI hemosiderin was present throughout the posterior right temporal and anterior right occipital lobes. However, there is also a subtle 9-10 millimeter hyperdense mass in the posterior left cingulate on series 3, image 22, and it is unclear whether this might be a small area of acute or chronic  hemorrhage. The brainstem and cerebellum are spared. There is Patchy and confluent bilateral cerebral white matter hypodensity which is chronic. No superimposed acute cortically based infarct identified. Vascular: Extensive Calcified atherosclerosis at the skull base. No suspicious intracranial vascular hyperdensity. Skull: No acute or suspicious osseous lesion identified. Sinuses/Orbits: Visualized paranasal sinuses and mastoids are stable and well pneumatized. Other: Chronic postoperative changes to both globes. Bilateral orbits soft tissues appears  stable and negative. Visualized scalp soft tissues are within normal limits. ASPECTS [facility] Stroke Program Early CT Score) Total score (0-10 with 10 being normal): Not applicable, acute hemorrhage. IMPRESSION: 1. Multifocal acute intra-axial hemorrhages in both posterior cerebral hemispheres, larger on the right (where collective blood volume is estimated at 55 mL). Underlying posterior right hemisphere encephalomalacia from a 2014 intra-axial hemorrhage. Additional subtle 9-10 mm hyperdense mass-like area in the posterior left cingulate. 2. Associated cerebral edema, but no midline shift or loss of basilar cisterns. No intraventricular extension. Trace extension into the subarachnoid space suspected. 3. 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 Brain MRI without and with contrast recommended when feasible to further characterize. 4. Underlying chronic white matter disease. Neurology Dr. Cheral Marker was sent a text page via the [employer] messaging system regarding critical findings on this exam at 8:02 am on 11/14/2018. Electronically Signed: By: Genevie Ann M.D. On: 11/14/2018 08:06   Vas US Carotid  Result Date: 11/16/2018 Carotid Arterial Duplex Study Indications:       CVA. Risk Factors:      Coronary artery disease, prior CVA. Comparison Study:  no prior Performing Technologist: Abram Sander RVS  Examination  Guidelines: A complete evaluation includes B-mode imaging, spectral Doppler, color Doppler, and power Doppler as needed of all accessible portions of each vessel. Bilateral testing is considered an integral part of a complete examination. Limited examinations for reoccurring indications may be performed as noted.  Right Carotid Findings: +----------+--------+--------+--------+------------------------+--------+           PSV cm/sEDV cm/sStenosisDescribe                Comments +----------+--------+--------+--------+------------------------+--------+ CCA Prox  93                      homogeneous                      +----------+--------+--------+--------+------------------------+--------+ CCA Distal72      10              homogeneous                      +----------+--------+--------+--------+------------------------+--------+ ICA Prox  163     19      1-39%   homogeneous and calcific         +----------+--------+--------+--------+------------------------+--------+ ICA Distal80      9                                                +----------+--------+--------+--------+------------------------+--------+ ECA       76                                                       +----------+--------+--------+--------+------------------------+--------+ +----------+--------+-------+--------+-------------------+           PSV cm/sEDV cmsDescribeArm Pressure (mmHG) +----------+--------+-------+--------+-------------------+ [id number]                                        +----------+--------+-------+--------+-------------------+ +---------+--------+--+--------+-+---------+ VertebralPSV cm/s44EDV cm/s8Antegrade +---------+--------+--+--------+-+---------+  Left Carotid Findings: +----------+--------+--------+--------+-----------+--------+  PSV cm/sEDV cm/sStenosisDescribe   Comments +----------+--------+--------+--------+-----------+--------+ CCA Prox   83                      homogeneous         +----------+--------+--------+--------+-----------+--------+ CCA Distal71      10              homogeneous         +----------+--------+--------+--------+-----------+--------+ ICA Prox  51      9       1-39%   homogeneous         +----------+--------+--------+--------+-----------+--------+ ICA Distal37      7                                   +----------+--------+--------+--------+-----------+--------+ ECA       106                                         +----------+--------+--------+--------+-----------+--------+ +----------+--------+--------+--------+-------------------+ SubclavianPSV cm/sEDV cm/sDescribeArm Pressure (mmHG) +----------+--------+--------+--------+-------------------+           88                                          +----------+--------+--------+--------+-------------------+ +---------+--------+--+--------+--+---------+ VertebralPSV cm/s82EDV cm/s14Antegrade +---------+--------+--+--------+--+---------+  Summary: Right Carotid: Velocities in the right ICA are consistent with a 1-39% stenosis. Left Carotid: Velocities in the left ICA are consistent with a 1-39% stenosis. Vertebrals: Bilateral vertebral arteries demonstrate antegrade flow. *See table(s) above for measurements and observations.  Electronically signed by Antony Contras MD on 11/16/2018 at 1:10:17 PM.    Final     Labs:  Basic Metabolic Panel: BMP Latest Ref Rng & Units 11/29/2018 11/26/2018 11/23/2018  Glucose 70 - 99 mg/dL 121(H) 123(H) 138(H)  BUN 8 - 23 mg/dL 10 13 14   Creatinine 0.61 - 1.24 mg/dL 0.72 0.83 0.90  Sodium 135 - 145 mmol/L 131(L) 135 137  Potassium 3.5 - 5.1 mmol/L 3.8 3.3(L) 4.1  Chloride 98 - 111 mmol/L 100 101 102  CO2 22 - 32 mmol/L 20(L) 25 22  Calcium 8.9 - 10.3 mg/dL 8.8(L) 8.6(L) 9.8    CBC: CBC Latest Ref Rng & Units 11/29/2018 11/26/2018 11/24/2018  WBC 4.0 - 10.5 K/uL 14.5(H) 14.7(H) 13.9(H)   Hemoglobin 13.0 - 17.0 g/dL 14.7 15.4 16.8  Hematocrit 39.0 - 52.0 % 42.8 45.2 50.0  Platelets 150 - 400 K/uL 417(H) 346 264    CBG: No results for input(s): GLUCAP in the last 168 hours.  Brief HPI:   Walter Larsen is a 83 y.o. male with history of CAD, [place] 2014 with residual visual deficits, BLE DVTs, who was admitted on 11/14/2018 with acute onset of total blindness.  MRI brain done revealing parenchymal hemorrhages posteriorly in both cerebral hemispheres with multiple additional chronic hemorrhages and punctate foci of diffusion abnormality left frontal lobe and left cerebellum.  CT head showed large hematoma right frontotemporoparietal junction with adjacent focal of hemorrhage right occipital lobe and left parieto-occipital lobe with minimal bilateral subarachnoid hemorrhages and bleeds suggestive of amyloid angiopathy.  Patient has had issues with confusion, disorientation and bouts of agitation therefore EEG done revealing mild diffuse encephalopathy.    He was started on Depakote for seizure prophylaxis  Elevated blood pressure treated with Cleviprex and was transitioned to metoprolol and amlodipine with improvement in BP control.  He has had issues with urinary retention requiring in and out caths.  As mentation improved he was started on dysphagia 2 diet.  He continued to have limitations with cortical blindness due to lack of awareness of deficits.  Confusion and was oriented to self only.  He was showing improvement in ability to follow one-step commands and CIR was recommended due to functional decline.   Hospital Course: GREENE VIALE was admitted to rehab 11/19/2018 for inpatient therapies to consist of PT, ST and OT at least three hours five days a week. Past admission physiatrist, therapy team and rehab RN have worked together to provide customized collaborative inpatient rehab.  He continues to have issues with sleep-wake disruption and therefore Seroquel was added to help with sleep  hygiene however he had excessive sedation to this therefore this was discontinued.  His p.o. intake continues to be poor and variable.  Blood pressures have been reasonably controlled on current regimen.  He continues to have issues with urinary retention requiring in and out catheterizations.     He was maintained on Depakote initially however due to bouts of lethargy CT of head was repeated 8/11 showing large bilateral cerebral hematomas with expected evolution in similar degree of edema and local mass-effect.  Neurology was consulted for input and recommended discontinuation of Depakote to see if this would help with mentation.  EEG showed mild to moderate diffuse encephalopathy and no seizures or epileptiform discharges.  Chest x-ray done for work-up and was negative for infection.  UA showed concerns of UTI therefore he was started on ceftriaxone due to upward trend in WBC and IVF added for hydration.  As urine culture negative ceftriaxone was discontinued on 8/14. He continues to be limited by language of confusion with inability to follow commands consistently.  Nutritional supplements were added between meals due to poor p.o. intake and nursing tech have attempted to feed him at meals however he has refused this.  Due to poor p.o. intake he was started on IV fluids and PEG tube was recommended for nutritional support.  Follow-up labs showed progressive hypernatremia therefore fluids were changed to D5W.  Serial CBC shows persistent leukocytosis but no signs of infection noted his participation in therapy has been limited due to ongoing encephalopathy with no progress.  His family was updated on ongoing issues and condition and agreed to meet with palliative care as well as look for living will to help determine goals of care.  After multiple discussions over the weekend, his family has elected on comfort care rather than recovery with decision to transition patient to hospice.  Patient has had increase  in agitation today felt to be due to hyperactive delirium and Zyprexa SL was added with recommendations of sublingual medication as patient refusing p.o.'s.  Bed is available at [facility] on 12/06/2018 and patient discharged to this facility.   Rehab course: During patient's stay in rehab weekly team conferences were held to monitor patient's progress, set goals and discuss barriers to discharge. At admission, patient required frequent moderate hand over hand assist to total cues/assit with basic self care tasks and total assist with mobility.  He had difficulty following one step commands and was limited by lethargy. He required max cues to attend to task with difficulty consuming dysphagia 2 diet therefore was downgraded to dysphagia 1.  He has made minimal to no  progress during his stay.  He is severely limited by cortical blindness, lethargy and cognitive impairments which has caused him to have progressive decline.  He requires total assist for ADLs and mobility.     Disposition: Hospice  Diet: Dysphagia or as tolerated  Special Instructions: 1.  Routine pressure relief measures. 2.  Offer Tylenol 4 times daily or schedule pain medicine for headaches as per protocol.  Allergies as of 12/06/2018      Reactions   Ativan [lorazepam] Other (See Comments)   confusion      Medication List    STOP taking these medications   amLODipine 5 MG tablet Commonly known as: NORVASC   Calcium + D3 600-800 MG-UNIT Tabs Generic drug: Calcium Carb-Cholecalciferol   divalproex 125 MG capsule Commonly known as: DEPAKOTE SPRINKLE   esomeprazole 40 MG capsule Commonly known as: NEXIUM   fenofibrate 160 MG tablet   FISH OIL PO   Glucosamine Chondroitin Triple Tabs   metoprolol tartrate 25 MG tablet Commonly known as: LOPRESSOR   multivitamin with minerals Tabs tablet   nitroGLYCERIN 0.4 MG SL tablet Commonly known as: NITROSTAT   QUEtiapine 25 MG tablet Commonly known as:  SEROquel   rosuvastatin 40 MG tablet Commonly known as: CRESTOR     TAKE these medications   acetaminophen 325 MG tablet Commonly known as: TYLENOL Take 1-2 tablets (325-650 mg total) by mouth every 4 (four) hours as needed for mild pain.   alum & mag hydroxide-simeth 200-200-20 MG/5ML suspension Commonly known as: MAALOX/MYLANTA Take 30 mLs by mouth every 4 (four) hours as needed for indigestion.   antiseptic oral rinse Liqd Apply 15 mLs topically as needed for dry mouth.   bisacodyl 10 MG suppository Commonly known as: DULCOLAX Place 1 suppository (10 mg total) rectally daily as needed for moderate constipation.   glycopyrrolate 1 MG tablet Commonly known as: ROBINUL Take 1 tablet (1 mg total) by mouth every 4 (four) hours as needed (excessive secretions).   glycopyrrolate 0.2 MG/ML injection Commonly known as: ROBINUL Inject 1 mL (0.2 mg total) into the skin every 4 (four) hours as needed (excessive secretions).   glycopyrrolate 0.2 MG/ML injection Commonly known as: ROBINUL Inject 1 mL (0.2 mg total) into the vein every 4 (four) hours as needed (excessive secretions).   haloperidol 0.5 MG tablet Commonly known as: HALDOL Take 1 tablet (0.5 mg total) by mouth every 4 (four) hours as needed for agitation (or delirium).   haloperidol lactate 5 MG/ML injection Commonly known as: HALDOL Inject 0.1 mLs (0.5 mg total) into the vein every 4 (four) hours as needed (or delirium).   OLANZapine zydis 5 MG disintegrating tablet Commonly known as: ZYPREXA Take 0.5 tablets (2.5 mg total) by mouth at bedtime.   pantoprazole sodium 40 mg/20 mL Pack Commonly known as: PROTONIX Place 20 mLs (40 mg total) into feeding tube at bedtime.   polyvinyl alcohol 1.4 % ophthalmic solution Commonly known as: LIQUIFILM TEARS Place 1 drop into both eyes 4 (four) times daily as needed for dry eyes.   prochlorperazine 10 MG/2ML injection Commonly known as: COMPAZINE Inject 1-2 mLs (5-10 mg  total) into the muscle every 6 (six) hours as needed.   prochlorperazine 25 MG suppository Commonly known as: COMPAZINE Place 0.5 suppositories (12.5 mg total) rectally every 6 (six) hours as needed for nausea.   prochlorperazine 5 MG tablet Commonly known as: COMPAZINE Take 1-2 tablets (5-10 mg total) by mouth every 6 (six) hours as needed for nausea.  senna-docusate 8.6-50 MG tablet Commonly known as: Senokot-S Take 1 tablet by mouth 2 (two) times daily.   sodium phosphate 7-19 GM/118ML Enem Place 133 mLs (1 enema total) rectally once as needed for severe constipation.   tamsulosin 0.4 MG Caps capsule Commonly known as: FLOMAX Take 1 capsule (0.4 mg total) by mouth daily after breakfast.        Signed: Bary Leriche 12/06/2018, 4:11 PM

## 2018-12-06 NOTE — Progress Notes (Signed)
Occupational Therapy Session Note  Patient Details  Name: Walter Larsen MRN: [id number] Date of Birth: Jan 10, 1936  Today's Date: 12/06/2018 OT Individual Time: 1130-1145 OT Individual Time Calculation (min): 15 min    Skilled Therapeutic Interventions/Progress Updates:    1:1 Pt in bed when arrived awake and alert. After pulling pt up in bed and engaging pt pt able to converse. Pt not oriented to place, time or situation. Pt did request something cold to drink/eat. By the time therapist went to get something- pt forgot and was refusing to taste a cold icey. Different approachs to engage pt in functional tasks at bed level but pt forcefully by politely declined- pushing therapist awake.  Tried to assist with washing face and adjusting clothing- pt physically resistive.   Put on gospel music- his choice - pt left resting in bed in safe position.   Therapy Documentation Precautions:  Precautions Precautions: Fall Precaution Comments: cortical blindness Restrictions Weight Bearing Restrictions: No General: General OT Amount of Missed Time: 15 Minutes Vital Signs:  Pain: Pain Assessment Pain Scale: 0-10 Pain Score: 0-No pain Faces Pain Scale: No hurt   Therapy/Group: Individual Therapy  Willeen Cass [facility] 12/06/2018, 11:42 AM

## 2018-12-06 NOTE — Progress Notes (Signed)
PMT RN heard from family that they desire [facility] for EOL care.  Patient is no longer accepting food/drink. Discussed with Reesa Chew, PA-C.  Patient has been agitated today.   At bedside patient is gently throwing his arms in the air and talking about nonsensical things.   Appears to have hyperactive delirium.  He does complain of head ache.  Will DC IVF. Initiate Zyprexa SL for delirium Recommend scheduled pain medication for headache (he has cerebral edema) During my visit [facility] called SW.  They have a bed for him today.  Prognosis:  Less than 2 weeks.  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  Recommendation - start olanzapine for delirium and scheduled pain relief for headache.  Patient has no IV access and refuses PO.  Will need transdermal and sublingual medications.  Florentina Jenny, PA-C Palliative Medicine Pager: [phone]  Time 15 min.

## 2018-12-06 NOTE — Progress Notes (Signed)
Palliative Medicine RN Note: Rec'd a call from pt's daughter Sondra Come. Family has unanimously decided that comfort is the goal, and they are requesting referral/transfer to [facility].   I called their liaison Cheri, who will start working on it.  Marjie Skiff Sandip Power, RN, BSN, [facility] Palliative Medicine Team 12/06/2018 1:36 PM Office [phone]

## 2018-12-06 NOTE — Progress Notes (Signed)
Physical Therapy Session Note  Patient Details  Name: TINO VERHALEN MRN: [id number] Date of Birth: 08-Nov-1935  Today's Date: 12/06/2018 PT Individual Time: 0805-0830 PT Individual Time Calculation (min): 25 min   Short Term Goals: Week 3:  PT Short Term Goal 1 (Week 3): =LTGs due to ELOS  Skilled Therapeutic Interventions/Progress Updates:   Pt awake in supine, eyes open and pt more agitated this morning. Pt w/ motor perseverations w/ hands on blanket throughout session and verbose. Provided w/ wash cloth in hand to wash face, verbal cues to attend to time and situation of morning and to get ready for the day. Pt stated "I don't need it" and declined washing face. Attempted to assist in eating breakfast, pt agreeable to take bites or sips of drink stating "yeah, that will be fine" but would bat at therapist's hands when bite of food or drink was brought to mouth. Pt would then state "quit it" and "do I have a choice?". Pt assured he had a choice in eating or not and offered multiple options. Pt eventually agreeable to sips of water as he c/o mouth dryness. Brought cup to lips, total assist and pt initiated and took sip of water x2. He declined further sips and immediately fell asleep in full upright in bed. Returned to supine and mitts donned, all needs in reach.   Therapy Documentation Precautions:  Precautions Precautions: Fall Precaution Comments: cortical blindness Restrictions Weight Bearing Restrictions: No  Therapy/Group: Individual Therapy  Renny Gunnarson Clent Demark 12/06/2018, 9:31 AM

## 2018-12-06 NOTE — Progress Notes (Signed)
Physical Therapy Discharge Summary  Patient Details  Name: Walter Larsen MRN: [id number] Date of Birth: 1936-03-21   Patient has met 0 of 5 long term goals due (see reason goals not met below).  Patient to discharge at a wheelchair level Total Assist.    Reasons goals not met: During this admission, pt severely limited by cortical blindness, lethargy, and cognitive impairments. Pt has declined in function due to this despite agressive 3 hrs/day of therapy at the beginning of his admission. Family wishing to pursue comfort care measures only.   Recommendation:  No further recommendation for skilled PT services, family wishing to pursue comfort care measures only.   Equipment: No equipment provided  Reasons for discharge: lack of progress toward goals and discharge from hospital  Patient/family agrees with progress made and goals achieved: Yes  PT Discharge Precautions/Restrictions Precautions Precautions: Other (comment) Precaution Comments: cortical blindness Restrictions Weight Bearing Restrictions: No Vital Signs Therapy Vitals Pulse Rate: 95 Resp: 18 BP: (!) 159/85 Patient Position (if appropriate): Lying Oxygen Therapy SpO2: 99 % O2 Device: Room Air Pain   Vision/Perception  Perception Perception: Impaired Body Scheme: poor body awareness Praxis Praxis: Impaired Praxis Impairment Details: Ideomotor;Initiation;Perseveration;Motor planning;Ideation  Cognition Overall Cognitive Status: Impaired/Different from baseline Arousal/Alertness: Lethargic Orientation Level: Oriented to person;Disoriented to place;Disoriented to time;Disoriented to situation Focused Attention: Impaired Sustained Attention: Impaired Memory: Impaired Awareness: Impaired Problem Solving: Impaired Behaviors: Restless;Physical agitation;Perseveration;Poor frustration tolerance Safety/Judgment: Impaired Sensation Sensation Light Touch: Appears Intact(pt responds to tactile stimuli on all  extremities appropriately) Motor  Motor Motor: Ataxia;Motor apraxia;Abnormal postural alignment and control Motor - Discharge Observations: difficult to assess 2/2 cognition/agitation, however pt w/ L lateral lean in seated and posterior lean bias during previous attempts at standing  Mobility Bed Mobility Bed Mobility: Rolling Right;Rolling Left;Supine to Sit;Sit to Supine Rolling Right: Dependent - Patient equal 0% Rolling Left: Dependent - Patient equal 0% Supine to Sit: Dependent - Patient equal 0% Sit to Sidelying Right: Dependent - Patient equal 0% Locomotion  Gait Ambulation: No Gait Gait: No Stairs / Additional Locomotion Stairs: No Wheelchair Mobility Wheelchair Mobility: No  Trunk/Postural Assessment  Cervical Assessment Cervical Assessment: Exceptions to WFL(decreased rotation, side-bending, and extension noticed during mobility, unable to formally assess 2/2 cognition) Thoracic Assessment Thoracic Assessment: Exceptions to WFL(rounded shoulders) Lumbar Assessment Lumbar Assessment: Exceptions to WFL(posterior pelvic tilt, increased stiffness) Postural Control Postural Control: Deficits on evaluation(impaired midline orientation)  Balance Balance Balance Assessed: Yes Static Sitting Balance Static Sitting - Level of Assistance: 1: +1 Total assist Dynamic Sitting Balance Dynamic Sitting - Level of Assistance: 1: +1 Total assist Extremity Assessment  RLE Assessment RLE Assessment: (difficult to formally assess 2/2 cognition, pt actively moving extremities against gravity) LLE Assessment LLE Assessment: (difficult to assess 2/2 cognition, pt actively moving all extremities against gravity)    Sonia Bromell K Dieter Hane 12/06/2018, 5:30 PM

## 2018-12-06 NOTE — Plan of Care (Signed)
  Problem: RH BOWEL ELIMINATION Goal: RH STG MANAGE BOWEL WITH ASSISTANCE Description: STG Manage Bowel with total Assistance.  Outcome: Completed/Met   Problem: RH BLADDER ELIMINATION Goal: RH STG MANAGE BLADDER WITH ASSISTANCE Description: STG Manage Bladder With total assist Outcome: Completed/Met   Problem: RH SKIN INTEGRITY Goal: RH STG MAINTAIN SKIN INTEGRITY WITH ASSISTANCE Description: STG Maintain Skin Integrity With total Assistance.  Outcome: Completed/Met   Problem: RH SAFETY Goal: RH STG ADHERE TO SAFETY PRECAUTIONS W/ASSISTANCE/DEVICE Description: STG Adhere to Safety Precautions With total Assistance/Device.  Outcome: Completed/Met   Problem: RH COGNITION-NURSING Goal: RH STG USES MEMORY AIDS/STRATEGIES W/ASSIST TO PROBLEM SOLVE Description: STG Uses Memory Aids/Strategies With Assistance to Problem Solve. Total assist Outcome: Completed/Met   Problem: Consults Goal: RH BRAIN INJURY PATIENT EDUCATION Description: Description: See Patient Education module for eduction specifics Outcome: Completed/Met

## 2018-12-06 NOTE — Progress Notes (Signed)
Speech Language Pathology Daily Session Note  Patient Details  Name: Walter Larsen MRN: [id number] Date of Birth: 1935-07-04  Today's Date: 12/06/2018 SLP Individual Time: 0830-0850 SLP Individual Time Calculation (min): 20 min  Short Term Goals: Week 3: SLP Short Term Goal 1 (Week 3): Pt will consume current diet with minimal overt s/s of aspiration and Max A cues for use of compensatory swallow strategies. SLP Short Term Goal 2 (Week 3): Pt will follow 1 step basic directions in 2 out of 10 opportunities with Max A multimodal cues. SLP Short Term Goal 3 (Week 3): Pt will sustain attention to basic familiar task for ~ 1 minute with Max A multimodal cues.  Skilled Therapeutic Interventions: Pt was seen for skilled ST, however session was limited by pt's fatigue and limited cooperation with therapeutic activities. Provided Max tactile and auditory cues, pt aroused from sleep and was agreeable to sit upright in bed to engage with therapist. Pt was initially agreeable to SLP's attempt to provide oral care, but shortly after beginning pt became somewhat agitated (swatting at therapist) and would not open mouth to finish oral care. Pt refused SLP's attempts to provide sips of water and bites of dysphagia 1 breakfast items. SLP expressed desire to make pt comfortable, educated pt regarding importance of oral care and hydration, and encouraged pt to allow therapist to assist with PO intake this morning. However, pt was ultimately unable to participate any further. Pt was left in bed with bed alarm set, call bell within reach and all needs met at this time. Continue per current plan of care.       Pain Pain Assessment Pain Scale: Faces Pain Score: 0-No pain Faces Pain Scale: No hurt  Therapy/Group: Individual Therapy  Arbutus Leas 12/06/2018, 12:31 PM

## 2018-12-06 NOTE — Progress Notes (Signed)
[facility] PHYSICAL MEDICINE & REHABILITATION PROGRESS NOTE   Subjective/Complaints: Pt awake in bed. Restless.  ROS: Limited due to cognitive/behavioral    Objective:   No results found. No results for input(s): WBC, HGB, HCT, PLT in the last 72 hours. No results for input(s): NA, K, CL, CO2, GLUCOSE, BUN, CREATININE, CALCIUM in the last 72 hours.  Intake/Output Summary (Last 24 hours) at 12/06/2018 1019 Last data filed at 12/06/2018 0200 Gross per 24 hour  Intake 300 ml  Output 1125 ml  Net -825 ml     Physical Exam: Vital Signs Blood pressure 140/82, pulse (!) 102, temperature 98.1 F (36.7 C), temperature source Oral, resp. rate 16, height 6' (1.829 m), weight 78.5 kg, SpO2 100 %. Constitutional: No distress . Vital signs reviewed. HEENT: EOMI, oral membranes moist Neck: supple Cardiovascular: RRR without murmur. No JVD    Respiratory: CTA Bilaterally without wheezes or rales. Normal effort    GI: BS +, non-tender, non-distended   Skin: Warm and dry.  Intact. Psych: Normal mood.  Normal behavior. Musc: No edema.  No tenderness. Neurological:knows his name, confused. Moves all 4's Skin: Skin iswarmand dry.  Psychiatric:restless    Assessment/Plan: 1. Functional deficits secondary to bilateral posterior circulation infarcts which require 3+ hours per day of interdisciplinary therapy in a comprehensive inpatient rehab setting.  Physiatrist is providing close team supervision and 24 hour management of active medical problems listed below.  Physiatrist and rehab team continue to assess barriers to discharge/monitor patient progress toward functional and medical goals  Care Tool:  Bathing    Body parts bathed by patient: Face   Body parts bathed by helper: Right arm, Left arm, Chest, Abdomen, Front perineal area, Buttocks, Right upper leg, Left upper leg, Right lower leg, Left lower leg, Face     Bathing assist Assist Level: 2 Helpers     Upper Body  Dressing/Undressing Upper body dressing   What is the patient wearing?: Pull over shirt    Upper body assist Assist Level: Total Assistance - Patient < 25%    Lower Body Dressing/Undressing Lower body dressing      What is the patient wearing?: Pants     Lower body assist Assist for lower body dressing: Total Assistance - Patient < 25%     Toileting Toileting    Toileting assist Assist for toileting: Dependent - Patient 0%     Transfers Chair/bed transfer  Transfers assist     Chair/bed transfer assist level: 2 Helpers     Locomotion Ambulation   Ambulation assist   Ambulation activity did not occur: Safety/medical concerns  Assist level: 2 helpers Assistive device: No Device Max distance: 15'   Walk 10 feet activity   Assist  Walk 10 feet activity did not occur: Safety/medical concerns  Assist level: 2 helpers     Walk 50 feet activity   Assist Walk 50 feet with 2 turns activity did not occur: Safety/medical concerns         Walk 150 feet activity   Assist Walk 150 feet activity did not occur: Safety/medical concerns         Walk 10 feet on uneven surface  activity   Assist Walk 10 feet on uneven surfaces activity did not occur: Safety/medical concerns         Wheelchair     Assist Will patient use wheelchair at discharge?: No   Wheelchair activity did not occur: N/A         Wheelchair 50  feet with 2 turns activity    Assist    Wheelchair 50 feet with 2 turns activity did not occur: N/A       Wheelchair 150 feet activity     Assist Wheelchair 150 feet activity did not occur: N/A        Medical Problem List and Plan: 1.Functional deficitssecondary to [place] with cortical blindness and confusion  Continue CIR therapies as tolerated, 15/7  -ongoing encephalopathy d/t multiple infarcts/associated edema  -appreciate palliative care input  -comfort care chosen  [facility] today?  -I have  written letter for son re: dad's medical status/competency 2. Antithrombotics: -DVT/anticoagulation:Mechanical:Sequential compression devices, below kneeBilateral lower extremities -antiplatelet therapy: N/A 3. Pain Management:N/A 4. Mood:LCSW to follow for evaluation and support when appropriate -antipsychotic agents: off scheduled seroquel  -dc ritalin 5. Neuropsych: This patientis notcapable of making decisions on hisown behalf. 6. Skin/Wound Care:Routine pressure relief measures 7. Fluids/Electrolytes/Nutrition: continue to push po, full supervision with meals  -supplements between meals as possible  -megace stopped   - continue D5 1/2NS for now  -prealbumin only [id number] Lopressor.    Amlodipine increased to 7.5 on 8/12   -consider catapres d/t inconsistent swallowing  -bp's stable Vitals:   12/05/18 1935 12/06/18 0331  BP: 130/69 140/82  Pulse: 98 (!) 102  Resp:  16  Temp:  98.1 F (36.7 C)  SpO2:  100%   9.Agitation: depakote and seroquel stopped 10.GERD: On Protonix 11.Leukocytosis: Monitor for signs of infection.  Recheck CBC in a.m.  WBCs 13.9 on 8/12---14.7 8/14  -no further labs 12. Urinary retention: Continue Flomax.   -UA negative, UCX negative x 2.   -           LOS: 17 days A FACE TO FACE EVALUATION WAS PERFORMED  Meredith Staggers 12/06/2018, 10:19 AM

## 2018-12-06 NOTE — Progress Notes (Signed)
Palliative Medicine RN Note: Rec'd request from PMT PA Florentina Jenny to send letter to son regarding pt's capacity. Letter sent via secure email.  Marjie Skiff Lavone Weisel, RN, BSN, [facility] Palliative Medicine Team 12/06/2018 12:54 PM Office [phone]

## 2018-12-07 ENCOUNTER — Inpatient Hospital Stay (HOSPITAL_COMMUNITY): Payer: Medicare HMO | Admitting: Occupational Therapy

## 2018-12-07 ENCOUNTER — Inpatient Hospital Stay (HOSPITAL_COMMUNITY): Payer: Medicare HMO | Admitting: Speech Pathology

## 2018-12-07 ENCOUNTER — Inpatient Hospital Stay (HOSPITAL_COMMUNITY): Payer: Medicare HMO

## 2018-12-07 NOTE — Progress Notes (Signed)
Occupational Therapy Discharge Summary  Patient Details  Name: Walter Larsen MRN: [id number] Date of Birth: 04-12-1936  Patient has met 0 of 4 long term goals due to.  Patient to discharge at overall Total Assist/dependent level.  Pt unfortunately experienced functional decline throughout his stay with [place] and is to discharge to hospice with no further rehabilitation goals. Pt transitioning to comfort care.   Reasons goals not met: Dependent for grooming tasks Pt is Total A for bathing Pt is total A for UB dressing Total A for awareness  Recommendation:  No further OT needs  Equipment: No equipment provided  Reasons for discharge: discharge from hospital  Patient/family agrees with progress made and goals achieved: Yes  OT Discharge Precautions/Restrictions  Precautions Precautions: Other (comment) Precaution Comments: cortical blindness Restrictions Weight Bearing Restrictions: No ADL ADL Eating: Dependent Where Assessed-Eating: Wheelchair Grooming: Dependent Where Assessed-Grooming: Sitting at sink Upper Body Bathing: Dependent Where Assessed-Upper Body Bathing: Sitting at sink Lower Body Bathing: Dependent Where Assessed-Lower Body Bathing: Sitting at sink, Standing at sink Upper Body Dressing: Dependent Where Assessed-Upper Body Dressing: Sitting at sink Lower Body Dressing: Dependent Where Assessed-Lower Body Dressing: Sitting at sink, Standing at sink Toilet Transfer: Unable to assess Toilet Transfer Method: Engineer, water: Bedside commode Vision   cortical blindness Perception  Perception: Impaired Body Scheme: poor body awareness Praxis Praxis: Impaired Praxis Impairment Details: Ideomotor;Initiation;Perseveration;Motor planning;Ideation Cognition Overall Cognitive Status: Impaired/Different from baseline Arousal/Alertness: Lethargic Orientation Level: Oriented to person;Disoriented to place;Disoriented to time;Disoriented to  situation Focused Attention: Impaired Sustained Attention: Impaired Memory: Impaired Awareness: Impaired Problem Solving: Impaired Behaviors: Restless;Physical agitation;Perseveration;Poor frustration tolerance Safety/Judgment: Impaired Sensation Coordination Gross Motor Movements are Fluid and Coordinated: No Fine Motor Movements are Fluid and Coordinated: No Motor  Motor Motor: Ataxia;Motor apraxia;Abnormal postural alignment and control Motor - Discharge Observations: difficult to assess 2/2 cognition/agitation, however pt w/ L lateral lean in seated and posterior lean bias during previous attempts at standing Mobility  Bed Mobility Bed Mobility: Rolling Right;Rolling Left;Supine to Sit;Sit to Supine Rolling Right: Dependent - Patient equal 0% Rolling Left: Dependent - Patient equal 0% Supine to Sit: Dependent - Patient equal 0% Sit to Sidelying Right: Dependent - Patient equal 0%  Balance Static Sitting Balance Static Sitting - Level of Assistance: 1: +1 Total assist Dynamic Sitting Balance Dynamic Sitting - Level of Assistance: 1: +1 Total assist Extremity/Trunk Assessment RUE Assessment RUE Assessment: Not tested(2/2 cognition) LUE Assessment LUE Assessment: Not tested(2/2 cognition)   [name] Mykeal Carrick 12/07/2018, 9:21 AM

## 2018-12-07 NOTE — Progress Notes (Signed)
Social Work Discharge Note   The overall goal for the admission was met for:   Discharge location: No - plan changed to end of life hospice care at [facility]  Length of Stay: Yes  Discharge activity level: No  Home/community participation: No  Services provided included: MD, RD, PT, OT, SLP, RN, TR, Pharmacy and [place]: [employer]  Follow-up services arranged: Other: [place]  Comments (or additional information):  Patient/Family verbalized understanding of follow-up arrangements: Yes  Individual responsible for coordination of the follow-up plan: spouse/ son  Confirmed correct DME delivered: Tytan Sandate 12/07/2018    Walter Larsen

## 2018-12-07 NOTE — Progress Notes (Signed)
Speech Language Pathology Discharge Summary  Patient Details  Name: Walter Larsen MRN: [id number] Date of Birth: 10-19-1935   Patient has met 0 of 5 long term goals.  Patient to discharge at overall Total level.   Reasons goals not met: Patient with functional decline and family is now pursuing comfort care only   Clinical Impression/Discharge Summary: Patient has not met any LTGs this admission. Patient has recently declined solids and liquids with minimal PO intake over the past week. Patient requires total A to complete any basic and functional task safely due to cortical blindness, severe lethargy and severe cognitive impairments. Due to patient's lack of progress and overall functional decline, patient's family is now pursuing comfort care only.   Recommendation:  No further recommendation for skilled SLP services, family wishing to pursue comfort care measures only.      Equipment: N/A   Reasons for discharge: Discharged from hospital   Patient/Family Agrees with Progress Made and Goals Achieved: Yes    [place], [place] 12/07/2018, 6:27 AM

## 2018-12-31 DIAGNOSIS — Z515 Encounter for palliative care: Secondary | ICD-10-CM

## 2019-01-13 DEATH — deceased

## 2020-06-01 IMAGING — CT CT HEAD WITHOUT CONTRAST
3 series · 15 of 47 positions shown, 18 images · non-contrast
Comparison: 11/15/2018

CLINICAL DATA: Fatigue and malaise

EXAM:
CT HEAD WITHOUT CONTRAST
TECHNIQUE: Contiguous axial images were obtained from the base of the skull
through the vertex without intravenous contrast.

[Series 3: head 5.0 h30s · axial · 0.45mm/px · z∈[-128,+12]mm · 9 of 34 slices shown, 12 images]
[im 3/34  brain]
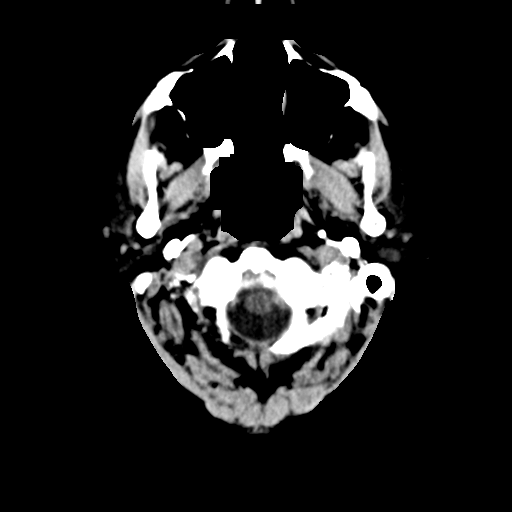
[im 3/34  bone]
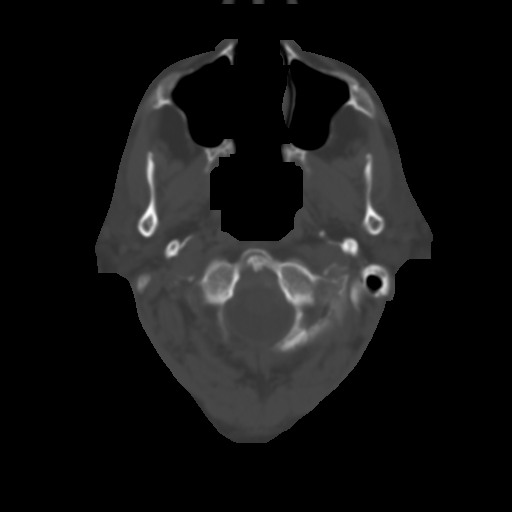
[im 6/34  brain]
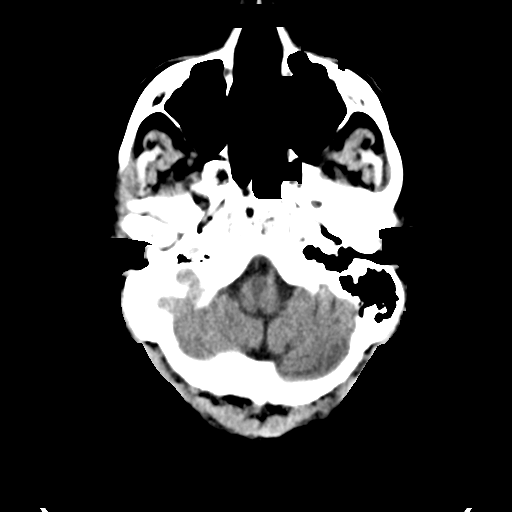
[im 10/34  brain]
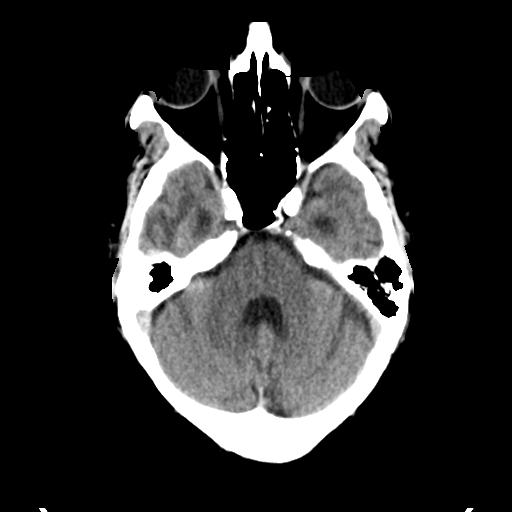
[im 13/34  brain]
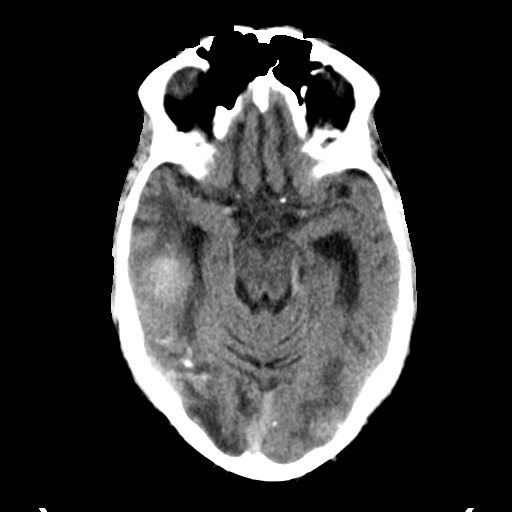
[im 18/34  brain]
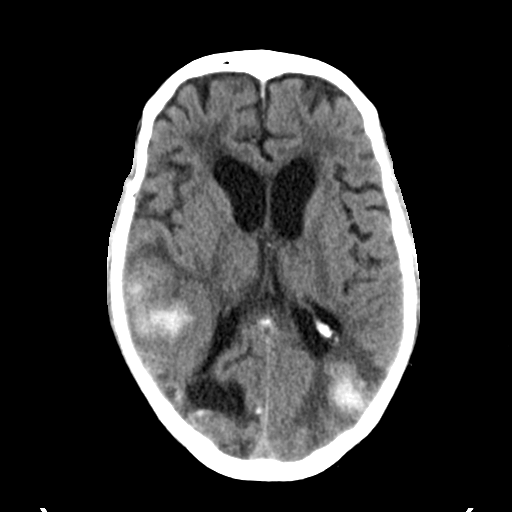
[im 18/34  bone]
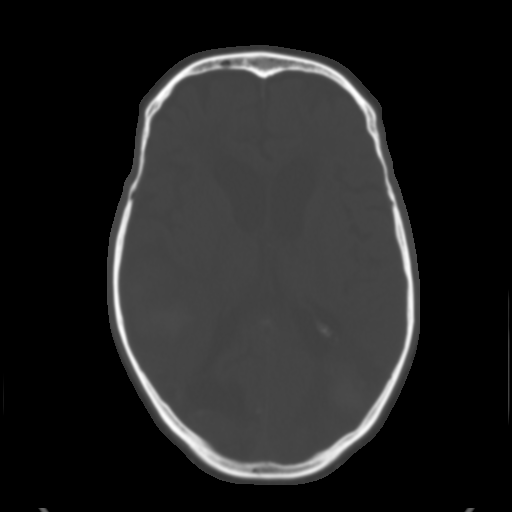
[im 21/34  brain]
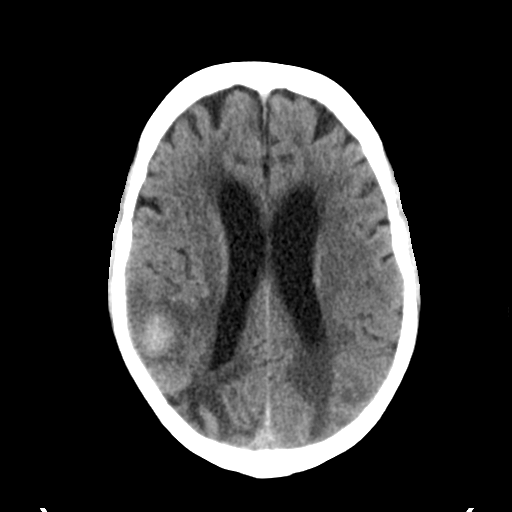
[im 24/34  brain]
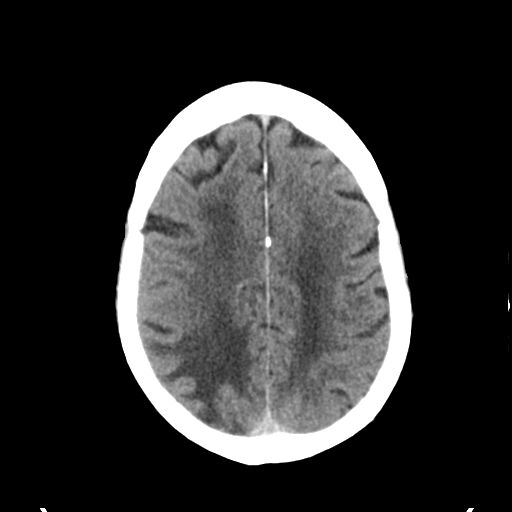
[im 28/34  brain]
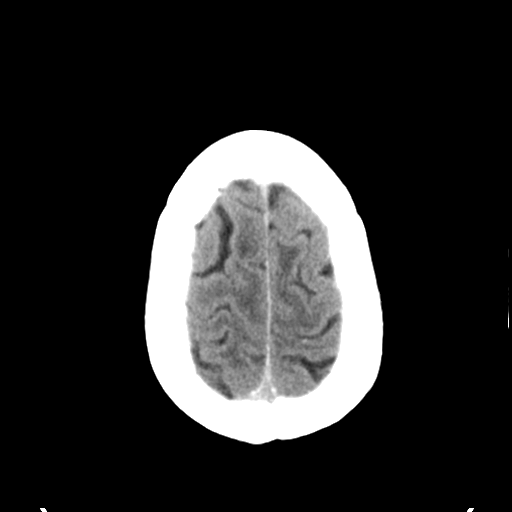
[im 31/34  brain]
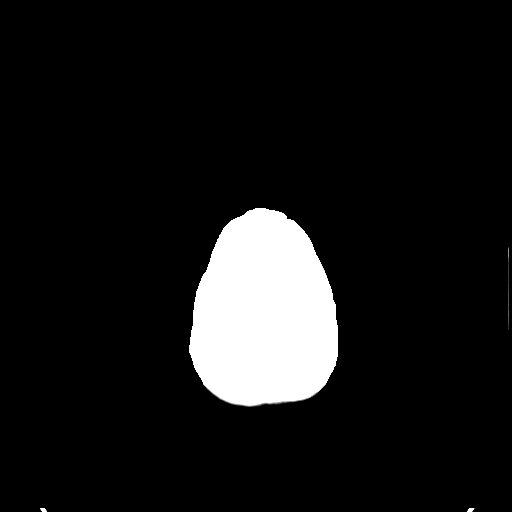
[im 31/34  bone]
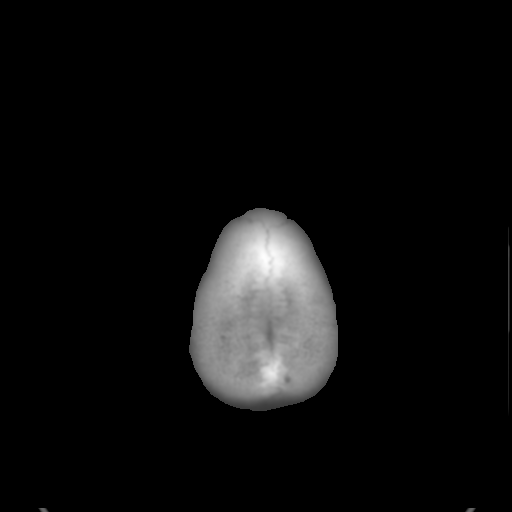

[Series 5: head 3.0 mpr cor · coronal · 0.31mm/px · 3 of 75 slices shown]
[im 25/75  brain]
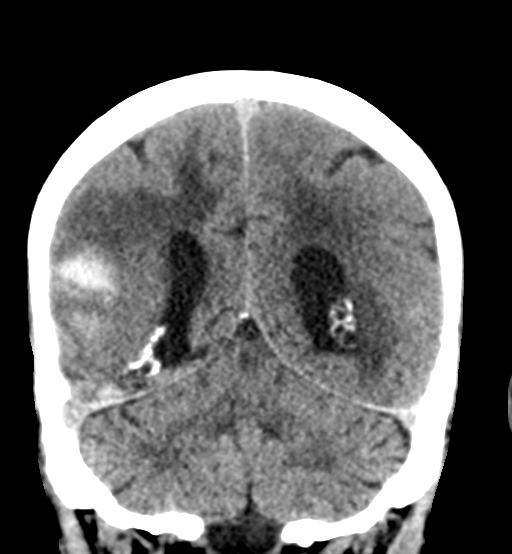
[im 33/75  brain]
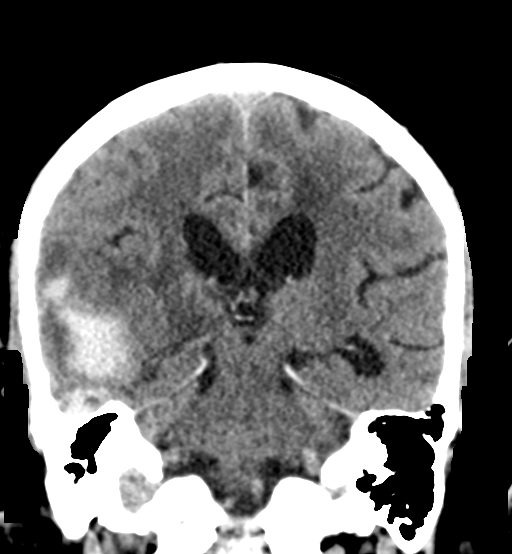
[im 42/75  brain]
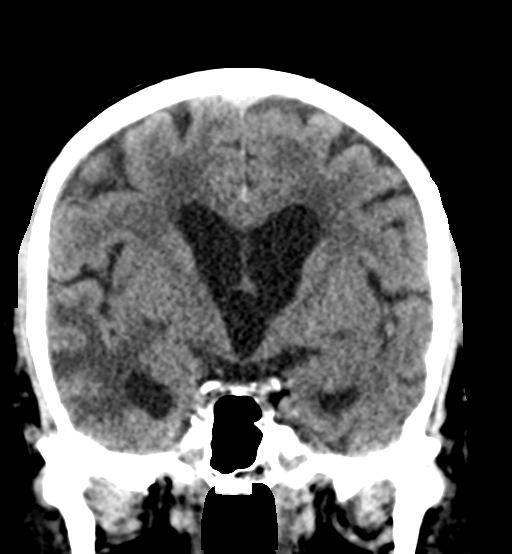

[Series 6: head 3.0 mpr sag · sagittal · 0.33mm/px · 3 of 57 slices shown]
[im 19/57  brain]
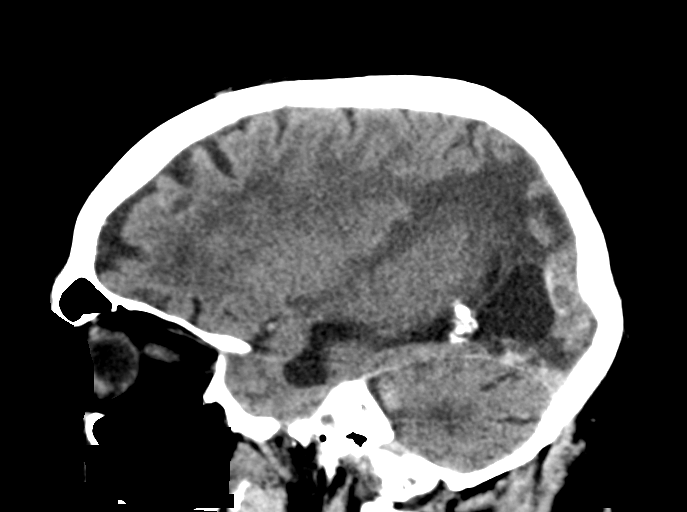
[im 29/57  brain]
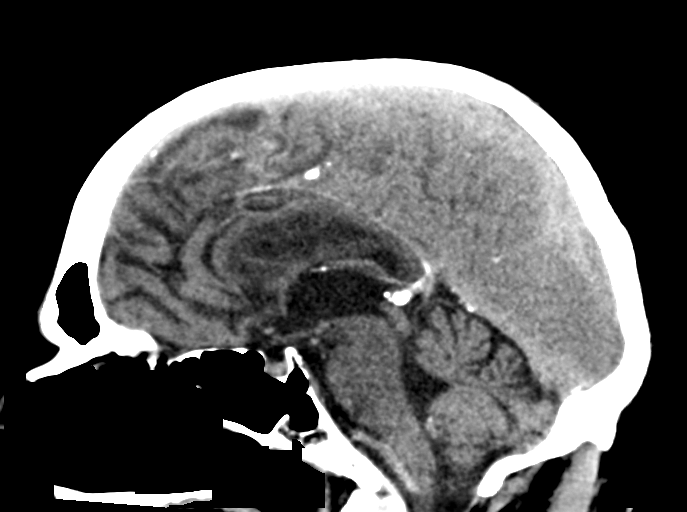
[im 38/57  brain]
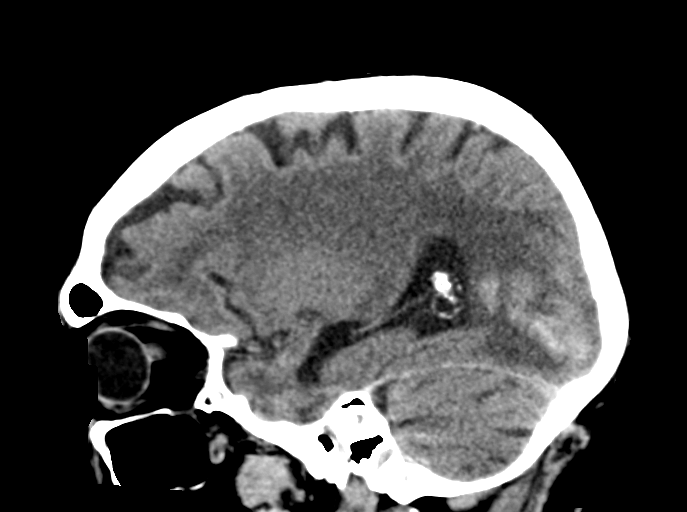

[15 of 47 positions shown; findings below may reference images not displayed]

FINDINGS: Brain: Decreased size, peripheral density, and circumscription of
subacute hematomas in the right posterior temporal and left
occipitotemporal lobes. Associated edema is unchanged. Dense
encephalomalacia in the right occipital lobe. Numerous remote
microhemorrhages by prior brain MRI suggesting amyloid.
Ventriculomegaly primarily related to central predominant volume
loss. No midline shift. No interval cytotoxic edema.

Vascular: Atherosclerotic calcification

Skull: Negative

Sinuses/Orbits: Bilateral cataract resection
IMPRESSION: Large bilateral cerebral hematomas show expected evolution with
decreasing size. Similar degree of associated edema and local mass
effect. No new abnormality.
